# Patient Record
Sex: Female | Born: 1975 | Race: Black or African American | Hispanic: No | Marital: Married | Smoking: Never smoker
Health system: Southern US, Community
[De-identification: ages and names within clinical notes are randomized; demographics above are authoritative.]

## PROBLEM LIST (undated history)

## (undated) DIAGNOSIS — R42 Dizziness and giddiness: Secondary | ICD-10-CM

## (undated) DIAGNOSIS — D649 Anemia, unspecified: Secondary | ICD-10-CM

## (undated) DIAGNOSIS — K219 Gastro-esophageal reflux disease without esophagitis: Secondary | ICD-10-CM

## (undated) DIAGNOSIS — D259 Leiomyoma of uterus, unspecified: Secondary | ICD-10-CM

## (undated) DIAGNOSIS — Z862 Personal history of diseases of the blood and blood-forming organs and certain disorders involving the immune mechanism: Secondary | ICD-10-CM

## (undated) DIAGNOSIS — G43909 Migraine, unspecified, not intractable, without status migrainosus: Secondary | ICD-10-CM

## (undated) DIAGNOSIS — K317 Polyp of stomach and duodenum: Secondary | ICD-10-CM

## (undated) DIAGNOSIS — K589 Irritable bowel syndrome without diarrhea: Secondary | ICD-10-CM

## (undated) DIAGNOSIS — I2699 Other pulmonary embolism without acute cor pulmonale: Secondary | ICD-10-CM

## (undated) HISTORY — DX: Dizziness and giddiness: R42

## (undated) HISTORY — DX: Polyp of stomach and duodenum: K31.7

## (undated) HISTORY — DX: Leiomyoma of uterus, unspecified: D25.9

## (undated) HISTORY — DX: Other pulmonary embolism without acute cor pulmonale: I26.99

## (undated) HISTORY — PX: CHOLECYSTECTOMY: SHX55

## (undated) HISTORY — DX: Migraine, unspecified, not intractable, without status migrainosus: G43.909

## (undated) HISTORY — DX: Anemia, unspecified: D64.9

## (undated) HISTORY — PX: HERNIA REPAIR: SHX51

---

## 2007-12-10 ENCOUNTER — Emergency Department: Payer: Self-pay | Admitting: Emergency Medicine

## 2008-01-03 ENCOUNTER — Ambulatory Visit: Payer: Self-pay | Admitting: Internal Medicine

## 2008-01-30 ENCOUNTER — Ambulatory Visit: Payer: Self-pay | Admitting: Internal Medicine

## 2008-01-31 ENCOUNTER — Ambulatory Visit: Payer: Self-pay | Admitting: Internal Medicine

## 2008-04-17 ENCOUNTER — Ambulatory Visit: Payer: Self-pay | Admitting: Internal Medicine

## 2009-01-06 ENCOUNTER — Ambulatory Visit: Payer: Self-pay | Admitting: Internal Medicine

## 2009-06-03 ENCOUNTER — Ambulatory Visit: Payer: Self-pay | Admitting: Internal Medicine

## 2009-08-24 ENCOUNTER — Emergency Department: Payer: Self-pay | Admitting: Internal Medicine

## 2009-08-25 ENCOUNTER — Emergency Department: Payer: Self-pay | Admitting: Emergency Medicine

## 2010-08-22 HISTORY — PX: UTERINE FIBROID SURGERY: SHX826

## 2010-08-22 HISTORY — PX: APPENDECTOMY: SHX54

## 2014-03-14 DIAGNOSIS — D509 Iron deficiency anemia, unspecified: Secondary | ICD-10-CM | POA: Insufficient documentation

## 2014-03-14 DIAGNOSIS — Z86018 Personal history of other benign neoplasm: Secondary | ICD-10-CM | POA: Insufficient documentation

## 2014-03-14 DIAGNOSIS — R0681 Apnea, not elsewhere classified: Secondary | ICD-10-CM | POA: Insufficient documentation

## 2014-03-14 DIAGNOSIS — R109 Unspecified abdominal pain: Secondary | ICD-10-CM | POA: Insufficient documentation

## 2014-03-14 HISTORY — DX: Iron deficiency anemia, unspecified: D50.9

## 2014-03-14 HISTORY — DX: Personal history of other benign neoplasm: Z86.018

## 2014-08-26 DIAGNOSIS — N939 Abnormal uterine and vaginal bleeding, unspecified: Secondary | ICD-10-CM | POA: Insufficient documentation

## 2015-05-12 ENCOUNTER — Ambulatory Visit (INDEPENDENT_AMBULATORY_CARE_PROVIDER_SITE_OTHER): Payer: 59 | Admitting: Family Medicine

## 2015-05-12 ENCOUNTER — Encounter: Payer: Self-pay | Admitting: Family Medicine

## 2015-05-12 VITALS — BP 118/78 | HR 102 | Temp 99.1°F | Resp 18 | Ht 64.5 in | Wt 196.8 lb

## 2015-05-12 DIAGNOSIS — R509 Fever, unspecified: Secondary | ICD-10-CM | POA: Insufficient documentation

## 2015-05-12 DIAGNOSIS — R05 Cough: Secondary | ICD-10-CM

## 2015-05-12 DIAGNOSIS — G44209 Tension-type headache, unspecified, not intractable: Secondary | ICD-10-CM

## 2015-05-12 DIAGNOSIS — R110 Nausea: Secondary | ICD-10-CM

## 2015-05-12 DIAGNOSIS — R059 Cough, unspecified: Secondary | ICD-10-CM | POA: Insufficient documentation

## 2015-05-12 DIAGNOSIS — R112 Nausea with vomiting, unspecified: Secondary | ICD-10-CM | POA: Insufficient documentation

## 2015-05-12 MED ORDER — ONDANSETRON 8 MG PO TBDP: 8.0000 mg | ORAL_TABLET | Freq: Three times a day (TID) | ORAL | Status: DC | PRN

## 2015-05-12 MED ORDER — OMEPRAZOLE 40 MG PO CPDR: 40.0000 mg | DELAYED_RELEASE_CAPSULE | Freq: Every day | ORAL | Status: DC

## 2015-05-12 NOTE — Progress Notes (Signed)
Name: Sharon Herring   MRN: [id number]    DOB: Mar 28, 1976   Date:05/12/2015       Progress Note  Subjective  Chief Complaint  Chief Complaint  Patient presents with  . Establish Care  . Emesis    HPI  Sharon Herring is a 39 y.o. female here today to transition care of medical needs to a primary care provider. She is having acute symptoms of nausea and vomiting starting this morning. Not associated with abdominal pain, diarrhea or constipation, rash, recent travel, new foods. She reports having a mild frontal headache without dizziness, ear fullness. Does report dry tongue, loss of taste for food, anorexia, fatigue and low grade temps.  History significant for uterine fibroids with heavy bleeding but she has had fibroidectomy some years ago and denies recent heavy menstrual bleeding.  She is not concerned for pregnancy at this time. Patient presents with headache. Symptoms began about a few hours ago. Generally, the headaches last about a few hours and occur rare. The headache do not seem to be related to any time of the day. The headaches are usually dull and are frontal in location.  The patient rates her most severe headaches a 3 on a scale from 1 to 10. Recently, the headaches are stable. School attendance or other daily activities are affected by the headaches. Precipitating factors include URI symptoms. The headaches are usually not preceded by an aura. Associated neurologic symptoms which are present include: vomiting in the early morning. The patient denies depression, dizziness, loss of balance, muscle weakness, numbness of extremities, speech difficulties and vision problems. Other associated symptoms include: appetite decrease, cough, fatigue, fever, nausea, sore throat and vomiting. Home treatment has included resting with fair improvement. Other history includes: nothing pertinent.   Active Ambulatory Problems    Diagnosis Date Noted  . Abnormal uterine bleeding 08/26/2014  . Absolute  anemia 03/14/2014  . Breathlessness on exertion 03/14/2014  . Fibroid 03/14/2014  . Nausea and vomiting in adult patient 05/12/2015  . Low grade fever 05/12/2015  . Cough 05/12/2015  . Cephalalgia 05/12/2015   Resolved Ambulatory Problems    Diagnosis Date Noted  . Abdominal pain 03/14/2014   Past Medical History  Diagnosis Date  . Fibroid uterus   . Anemia     Social History  Substance Use Topics  . Smoking status: Never Smoker   . Smokeless tobacco: Not on file  . Alcohol Use: No    No current outpatient prescriptions on file.  Past Surgical History  Procedure Laterality Date  . Uterine fibroid surgery  2012    Family History  Problem Relation Age of Onset  . Hypertension Mother   . Hypertension Father     Allergies  Allergen Reactions  . Aspirin Nausea And Vomiting  . Oxycodone-Acetaminophen Rash and Shortness Of Breath  . Tape Other (See Comments)    Burning feeling. Has left mark on skin.  . Norethindrone Rash     Review of Systems  CONSTITUTIONAL: No significant weight changes, fever, chills, weakness. Yes fatigue.  HEENT:  - Eyes: No visual changes.  - Ears: No auditory changes. No pain.  - Nose: No sneezing, congestion, runny nose. - Throat: Mild sore throat. No changes in swallowing. SKIN: No rash or itching.  CARDIOVASCULAR: No chest pain, chest pressure or chest discomfort. No palpitations or edema.  RESPIRATORY: No shortness of breath. Mild cough. GASTROINTESTINAL: Yes anorexia, nausea, vomiting. No changes in bowel habits. No abdominal pain or blood.  GENITOURINARY: No  dysuria. No frequency. No discharge.  NEUROLOGICAL: Mild headache. No dizziness, syncope, paralysis, ataxia, numbness or tingling in the extremities. No memory changes. No change in bowel or bladder control.  MUSCULOSKELETAL: No joint pain. No muscle pain. HEMATOLOGIC: No anemia, bleeding or bruising.  LYMPHATICS: No enlarged lymph nodes.  PSYCHIATRIC: No change in mood. No  change in sleep pattern.  ENDOCRINOLOGIC: No reports of sweating, cold or heat intolerance. No polyuria or polydipsia.     Objective  BP 118/78 mmHg  Pulse 102  Temp(Src) 99.1 F (37.3 C) (Oral)  Resp 18  Ht 5' 4.5" (1.638 m)  Wt 196 lb 12.8 oz (89.268 kg)  BMI 33.27 kg/m2  SpO2 99%  LMP 04/25/2015 (Exact Date) Body mass index is 33.27 kg/(m^2).  Physical Exam  Constitutional: Patient appears well-developed and well-nourished, warm to the touch. In no distress.  HEENT:  - Head: Normocephalic and atraumatic.  - Ears: Bilateral TMs gray, no erythema or effusion - Nose: Nasal mucosa moist, congested. - Mouth/Throat: Oropharynx is clear and dry. No tonsillar hypertrophy or erythema. No post nasal drainage.  - Eyes: Conjunctivae clear, EOM movements normal. PERRLA. No scleral icterus.  Neck: Normal range of motion. Neck supple. No JVD present. No thyromegaly present.  Cardiovascular: Normal rate, regular rhythm and normal heart sounds.  No murmur heard.  Pulmonary/Chest: Effort normal and breath sounds normal. No respiratory distress. Abdomen: Soft, bowel sounds slightly increased generally, no HSM, no guarding. Mild tenderness at epigastric area.  Musculoskeletal: Normal range of motion bilateral UE and LE, no joint effusions. Peripheral vascular: Bilateral LE no edema. Neurological: CN II-XII grossly intact with no focal deficits. Alert and oriented to person, place, and time. Coordination, balance, strength, speech and gait are normal.  Skin: Skin is warm and dry. No rash noted. No erythema.  Psychiatric: Patient has a normal mood and affect. Behavior is normal in office today. Judgment and thought content normal in office today.   Assessment & Plan  1. Nausea and vomiting in adult patient Etiology unclear, may be gastritis or viral infection early presentation. Plan to treat with anti emetic medication, increase hydration at home, and start PPI. If symptoms worsen plan is to  get blood work and CXR.  - ondansetron (ZOFRAN-ODT) 8 MG disintegrating tablet; Take 1 tablet (8 mg total) by mouth every 8 (eight) hours as needed for nausea or vomiting.  Dispense: 30 tablet; Refill: 1 - omeprazole (PRILOSEC) 40 MG capsule; Take 1 capsule (40 mg total) by mouth daily.  Dispense: 14 capsule; Refill: 0  2. Low grade fever Rapid flu swab negative.  - POCT Influenza A/B; Standing - POCT Influenza A/B  3. Cough Treat symptomatically and if progresses will get CXR.   4. Tension-type headache, not intractable, unspecified chronicity pattern Likely related to current acute prodrome combined with mild dehydration. Tameka has been instructed to increase water intake and use NSAID or Tylenol as tolerated.

## 2015-06-02 ENCOUNTER — Ambulatory Visit (INDEPENDENT_AMBULATORY_CARE_PROVIDER_SITE_OTHER): Payer: 59 | Admitting: Family Medicine

## 2015-06-02 ENCOUNTER — Encounter: Payer: Self-pay | Admitting: Family Medicine

## 2015-06-02 VITALS — BP 124/78 | HR 95 | Temp 98.1°F | Resp 16 | Ht 65.0 in | Wt 202.2 lb

## 2015-06-02 DIAGNOSIS — E669 Obesity, unspecified: Secondary | ICD-10-CM

## 2015-06-02 DIAGNOSIS — Z136 Encounter for screening for cardiovascular disorders: Secondary | ICD-10-CM

## 2015-06-02 DIAGNOSIS — Z13220 Encounter for screening for lipoid disorders: Secondary | ICD-10-CM

## 2015-06-02 DIAGNOSIS — IMO0001 Reserved for inherently not codable concepts without codable children: Secondary | ICD-10-CM

## 2015-06-02 DIAGNOSIS — D509 Iron deficiency anemia, unspecified: Secondary | ICD-10-CM

## 2015-06-02 DIAGNOSIS — E66811 Obesity, class 1: Secondary | ICD-10-CM

## 2015-06-02 MED ORDER — BELVIQ 10 MG PO TABS: 1.0000 | ORAL_TABLET | Freq: Two times a day (BID) | ORAL | Status: DC

## 2015-06-02 NOTE — Progress Notes (Signed)
Name: Sharon Herring   MRN: [id number]    DOB: Feb 11, 1976   Date:06/02/2015       Progress Note  Subjective  Chief Complaint  Chief Complaint  Patient presents with  . Obesity    pt here to discuss weight loss    HPI  Sharon Herring is a 39 year old female who is here today to discuss her weight gain. Today's weight is the most she has weight. Previously she was exercising regularly and has maintained a weight around 135-140 lbs which she is more comfortable with. She has not had time to exercise routinely like she used to but is planning on returning to a better routine. Overall her diet is well balanced consisting of vegetables, fruits, lean meats. She does like carbohydrates but not in the form of frequent sugars, sodas, coffees. She drinks tea and keep well hydrated. Started drinking Flat Tummy Tea. History of anemia with good control with oral iron supplement use although not daily.   Past Medical History  Diagnosis Date  . Fibroid uterus   . Anemia     Patient Active Problem List   Diagnosis Date Noted  . Nausea and vomiting in adult patient 05/12/2015  . Low grade fever 05/12/2015  . Cough 05/12/2015  . Cephalalgia 05/12/2015  . Abnormal uterine bleeding 08/26/2014  . Absolute anemia 03/14/2014  . Breathlessness on exertion 03/14/2014  . Fibroid 03/14/2014    Social History  Substance Use Topics  . Smoking status: Never Smoker   . Smokeless tobacco: Not on file  . Alcohol Use: No     Current outpatient prescriptions:  .  omeprazole (PRILOSEC) 40 MG capsule, Take 1 capsule (40 mg total) by mouth daily. (Patient not taking: Reported on 06/02/2015), Disp: 14 capsule, Rfl: 0 .  ondansetron (ZOFRAN-ODT) 8 MG disintegrating tablet, Take 1 tablet (8 mg total) by mouth every 8 (eight) hours as needed for nausea or vomiting. (Patient not taking: Reported on 06/02/2015), Disp: 30 tablet, Rfl: 1  Allergies  Allergen Reactions  . Aspirin Nausea And Vomiting  .  Oxycodone-Acetaminophen Rash and Shortness Of Breath  . Tape Other (See Comments)    Burning feeling. Has left mark on skin.  . Norethindrone Rash    Review of Systems  CONSTITUTIONAL: Yes significant weight changes. No fever, chills, weakness or fatigue.  CARDIOVASCULAR: No chest pain, chest pressure or chest discomfort. No palpitations or edema.  RESPIRATORY: No shortness of breath, cough or sputum.  GASTROINTESTINAL: No anorexia, nausea, vomiting. No changes in bowel habits. No abdominal pain or blood.  HEMATOLOGIC: No bleeding or bruising.  LYMPHATICS: No enlarged lymph nodes.  PSYCHIATRIC: No change in mood. No change in sleep pattern.  ENDOCRINOLOGIC: No reports of sweating, cold or heat intolerance. No polyuria or polydipsia.     Objective  BP 124/78 mmHg  Pulse 95  Temp(Src) 98.1 F (36.7 C)  Resp 16  Ht 5\' 5"  (1.651 m)  Wt 202 lb 4 oz (91.74 kg)  BMI 33.66 kg/m2  SpO2 99%  LMP 05/25/2015  Body mass index is 33.66 kg/(m^2).   Physical Exam  Constitutional: Patient is overweight and well-nourished. In no distress.  HEENT:  - Head: Normocephalic and atraumatic.  - Ears: Bilateral TMs gray, no erythema or effusion - Nose: Nasal mucosa moist - Mouth/Throat: Oropharynx is clear and moist. No tonsillar hypertrophy or erythema. No post nasal drainage.  - Eyes: Conjunctivae clear, EOM movements normal. PERRLA. No scleral icterus.  Neck: Normal range of motion. Neck  supple. No JVD present. No thyromegaly present.  Cardiovascular: Normal rate, regular rhythm and normal heart sounds.  No murmur heard.  Pulmonary/Chest: Effort normal and breath sounds normal. No respiratory distress. Musculoskeletal: Normal range of motion bilateral UE and LE, no joint effusions. Abdomen: Soft, non tender, non distended, normal bowel sounds. Skin: Skin is warm and dry. No rash noted. No erythema.  Psychiatric: Patient has a normal mood and affect. Behavior is normal in office today.  Judgment and thought content normal in office today.    Assessment & Plan   1. Obesity, Class I, BMI 30.0-34.9 (see actual BMI) The patient has been counseled on their higher than normal BMI.  They have verbally expressed understanding their increased risk for other diseases.  In efforts to meet a better target BMI goal the patient has been counseled on lifestyle, diet and exercise modification tactics. Start with moderate intensity aerobic exercise (walking, jogging, elliptical, swimming, group or individual sports, hiking) at least 36mins a day at least 4 days a week and increase intensity, duration, frequency as tolerated. Diet should include well balance fresh fruits and vegetables avoiding processed foods, carbohydrates and sugars. Drink at least 8oz 10 glasses a day avoiding sodas, sugary fruit drinks, sweetened tea. Check weight on a reliable scale daily and monitor weight loss progress daily. Consider investing in mobile phone apps that will help keep track of weight loss goals.  Belviq has known to cause blood cell line suppression rarely but will get baseline blood work and check her cbc periodically.  - CBC with Differential/Platelet - Comprehensive metabolic panel - TSH - BELVIQ 10 MG TABS; Take 1 tablet by mouth 2 (two) times daily.  Dispense: 60 tablet; Refill: 3  2. Encounter for cholesteral screening for cardiovascular disease  - Lipid panel  3. Iron deficiency anemia Blood work to recheck anemia status.  - CBC with Differential/Platelet - Comprehensive metabolic panel - TSH

## 2015-06-08 ENCOUNTER — Other Ambulatory Visit
Admission: RE | Admit: 2015-06-08 | Discharge: 2015-06-08 | Disposition: A | Discharge status: Home / Self Care | Payer: 59 | Source: Ambulatory Visit | Attending: Family Medicine | Admitting: Family Medicine

## 2015-06-08 DIAGNOSIS — Z136 Encounter for screening for cardiovascular disorders: Secondary | ICD-10-CM | POA: Insufficient documentation

## 2015-06-08 DIAGNOSIS — D509 Iron deficiency anemia, unspecified: Secondary | ICD-10-CM | POA: Diagnosis present

## 2015-06-08 DIAGNOSIS — Z13220 Encounter for screening for lipoid disorders: Secondary | ICD-10-CM | POA: Diagnosis present

## 2015-06-08 DIAGNOSIS — E669 Obesity, unspecified: Secondary | ICD-10-CM | POA: Diagnosis present

## 2015-06-08 LAB — COMPREHENSIVE METABOLIC PANEL
ALBUMIN: 3.6 g/dL (ref 3.5–5.0)
ALK PHOS: 56 U/L (ref 38–126)
ALT: 12 U/L — AB (ref 14–54)
AST: 15 U/L (ref 15–41)
Anion gap: 7 (ref 5–15)
BUN: 10 mg/dL (ref 6–20)
CALCIUM: 9.3 mg/dL (ref 8.9–10.3)
CO2: 27 mmol/L (ref 22–32)
CREATININE: 0.71 mg/dL (ref 0.44–1.00)
Chloride: 103 mmol/L (ref 101–111)
GFR calc Af Amer: >60 mL/min (ref >60)
GFR calc non Af Amer: >60 mL/min (ref >60)
GLUCOSE: 112 mg/dL — AB (ref 65–99)
Potassium: 3.9 mmol/L (ref 3.5–5.1)
SODIUM: 137 mmol/L (ref 135–145)
Total Bilirubin: 0.2 mg/dL — ABNORMAL LOW (ref 0.3–1.2)
Total Protein: 7.3 g/dL (ref 6.5–8.1)

## 2015-06-08 LAB — CBC WITH DIFFERENTIAL/PLATELET
BASOS PCT: 0 %
Basophils Absolute: 0 10*3/uL (ref 0–0.1)
EOS PCT: 1 %
Eosinophils Absolute: 0 10*3/uL (ref 0–0.7)
HEMATOCRIT: 32.4 % — AB (ref 35.0–47.0)
HEMOGLOBIN: 10.4 g/dL — AB (ref 12.0–16.0)
LYMPHS PCT: 39 %
Lymphs Abs: 1.9 10*3/uL (ref 1.0–3.6)
MCH: 25.9 pg — ABNORMAL LOW (ref 26.0–34.0)
MCHC: 32 g/dL (ref 32.0–36.0)
MCV: 81 fL (ref 80.0–100.0)
MONO ABS: 0.4 10*3/uL (ref 0.2–0.9)
MONOS PCT: 9 %
Neutro Abs: 2.5 10*3/uL (ref 1.4–6.5)
Neutrophils Relative %: 51 %
Platelets: 410 10*3/uL (ref 150–440)
RBC: 4 MIL/uL (ref 3.80–5.20)
RDW: 14.5 % (ref 11.5–14.5)
WBC: 4.9 10*3/uL (ref 3.6–11.0)

## 2015-06-08 LAB — LIPID PANEL
Cholesterol: 163 mg/dL (ref 0–200)
HDL: 36 mg/dL — AB (ref >40)
LDL CALC: 108 mg/dL — AB (ref 0–99)
Total CHOL/HDL Ratio: 4.5 RATIO
Triglycerides: 94 mg/dL (ref <150)
VLDL: 19 mg/dL (ref 0–40)

## 2015-06-08 LAB — TSH: TSH: 1.74 u[IU]/mL (ref 0.350–4.500)

## 2015-06-09 ENCOUNTER — Telehealth: Payer: Self-pay | Admitting: Family Medicine

## 2015-06-09 NOTE — Telephone Encounter (Signed)
Patient need prior authorization for mediation prescribed

## 2015-06-10 NOTE — Telephone Encounter (Signed)
Ethelle, please get with Shelly around the PA for the [facility] Rx I prescribed for you.

## 2015-06-10 NOTE — Telephone Encounter (Signed)
PA has been submitted.

## 2015-06-26 ENCOUNTER — Other Ambulatory Visit: Payer: Self-pay | Admitting: Family Medicine

## 2015-06-26 DIAGNOSIS — E669 Obesity, unspecified: Secondary | ICD-10-CM

## 2015-06-26 MED ORDER — NALTREXONE-BUPROPION HCL ER 8-90 MG PO TB12: 1.0000 | ORAL_TABLET | Freq: Two times a day (BID) | ORAL | Status: DC

## 2015-07-02 ENCOUNTER — Other Ambulatory Visit: Payer: Self-pay

## 2015-07-02 DIAGNOSIS — E669 Obesity, unspecified: Secondary | ICD-10-CM

## 2015-07-02 MED ORDER — NALTREXONE-BUPROPION HCL ER 8-90 MG PO TB12: 2.0000 | ORAL_TABLET | Freq: Two times a day (BID) | ORAL | Status: DC

## 2015-07-06 ENCOUNTER — Other Ambulatory Visit: Payer: Self-pay | Admitting: Family Medicine

## 2015-07-06 DIAGNOSIS — G43001 Migraine without aura, not intractable, with status migrainosus: Secondary | ICD-10-CM

## 2015-07-06 MED ORDER — SUMATRIPTAN SUCCINATE 50 MG PO TABS: ORAL_TABLET | ORAL | Status: DC

## 2015-07-21 ENCOUNTER — Other Ambulatory Visit: Payer: Self-pay | Admitting: Family Medicine

## 2015-07-21 MED ORDER — LORCASERIN HCL 10 MG PO TABS: 1.0000 | ORAL_TABLET | Freq: Two times a day (BID) | ORAL | Status: DC

## 2015-08-11 ENCOUNTER — Other Ambulatory Visit: Payer: Self-pay | Admitting: Family Medicine

## 2015-08-11 DIAGNOSIS — D509 Iron deficiency anemia, unspecified: Secondary | ICD-10-CM

## 2015-08-11 DIAGNOSIS — K5900 Constipation, unspecified: Secondary | ICD-10-CM

## 2015-08-11 MED ORDER — DOCUSATE SODIUM 100 MG PO CAPS: 100.0000 mg | ORAL_CAPSULE | Freq: Every day | ORAL | Status: DC

## 2015-08-11 MED ORDER — FERROUS SULFATE 325 (65 FE) MG PO TABS: 325.0000 mg | ORAL_TABLET | Freq: Every day | ORAL | Status: DC

## 2015-09-23 ENCOUNTER — Other Ambulatory Visit: Payer: Self-pay | Admitting: Family Medicine

## 2015-09-23 DIAGNOSIS — R519 Headache, unspecified: Secondary | ICD-10-CM

## 2015-09-23 DIAGNOSIS — R51 Headache: Secondary | ICD-10-CM

## 2015-09-23 DIAGNOSIS — K5901 Slow transit constipation: Secondary | ICD-10-CM

## 2015-09-23 DIAGNOSIS — K5909 Other constipation: Secondary | ICD-10-CM | POA: Insufficient documentation

## 2015-09-23 MED ORDER — IBUPROFEN 800 MG PO TABS: 800.0000 mg | ORAL_TABLET | Freq: Three times a day (TID) | ORAL | Status: DC | PRN

## 2015-11-02 ENCOUNTER — Telehealth (INDEPENDENT_AMBULATORY_CARE_PROVIDER_SITE_OTHER): Payer: 59

## 2015-11-02 DIAGNOSIS — R300 Dysuria: Secondary | ICD-10-CM | POA: Diagnosis not present

## 2015-11-02 LAB — POCT URINALYSIS DIPSTICK
Bilirubin, UA: NEGATIVE
GLUCOSE UA: NEGATIVE
Ketones, UA: NEGATIVE
LEUKOCYTES UA: NEGATIVE
NITRITE UA: NEGATIVE
Protein, UA: NEGATIVE
RBC UA: NEGATIVE
Spec Grav, UA: 1.015
UROBILINOGEN UA: 0.2
pH, UA: 5.5

## 2015-11-02 NOTE — Telephone Encounter (Signed)
Will order Udip and Ucx and treat if needed. Send me results of Udip please.

## 2015-11-02 NOTE — Telephone Encounter (Signed)
Patient having symptoms of UTI.  Having low back pain and pressure with dark color urine.

## 2015-11-03 ENCOUNTER — Other Ambulatory Visit: Payer: Self-pay | Admitting: Family Medicine

## 2015-11-03 DIAGNOSIS — R300 Dysuria: Secondary | ICD-10-CM | POA: Diagnosis not present

## 2015-11-06 ENCOUNTER — Other Ambulatory Visit: Payer: Self-pay | Admitting: Family Medicine

## 2015-11-06 DIAGNOSIS — E669 Obesity, unspecified: Secondary | ICD-10-CM

## 2015-11-06 LAB — URINE CULTURE

## 2015-11-06 MED ORDER — PHENTERMINE HCL 37.5 MG PO CAPS: 37.5000 mg | ORAL_CAPSULE | Freq: Every day | ORAL | Status: DC

## 2015-11-10 ENCOUNTER — Telehealth: Payer: Self-pay | Admitting: Family Medicine

## 2015-11-10 ENCOUNTER — Other Ambulatory Visit: Payer: Self-pay

## 2015-11-10 MED ORDER — OSELTAMIVIR PHOSPHATE 75 MG PO CAPS: 75.0000 mg | ORAL_CAPSULE | Freq: Every day | ORAL | Status: DC

## 2015-11-10 MED ORDER — NITROFURANTOIN MONOHYD MACRO 100 MG PO CAPS: 100.0000 mg | ORAL_CAPSULE | Freq: Two times a day (BID) | ORAL | Status: DC

## 2015-11-10 NOTE — Telephone Encounter (Signed)
Patient was exposed and is requesting prophylaxis

## 2015-11-10 NOTE — Telephone Encounter (Signed)
I reviewed urine culture; not overwhelming cystitis, but there is evidence of some infection Sharon Herring, please contact pt If patient still having symptoms, let her know I'm sending antibiotic If symptoms don't resolve after antibiotics (or get worse), please contact us right away Encourage hydration Thank you

## 2015-11-10 NOTE — Telephone Encounter (Signed)
Patient notified

## 2015-11-13 ENCOUNTER — Encounter: Payer: Self-pay | Admitting: Family Medicine

## 2015-11-13 ENCOUNTER — Ambulatory Visit (INDEPENDENT_AMBULATORY_CARE_PROVIDER_SITE_OTHER): Payer: 59 | Admitting: Family Medicine

## 2015-11-13 VITALS — BP 110/68 | HR 94 | Temp 97.9°F | Resp 16 | Wt 210.6 lb

## 2015-11-13 DIAGNOSIS — M5442 Lumbago with sciatica, left side: Secondary | ICD-10-CM | POA: Diagnosis not present

## 2015-11-13 DIAGNOSIS — N920 Excessive and frequent menstruation with regular cycle: Secondary | ICD-10-CM | POA: Insufficient documentation

## 2015-11-13 MED ORDER — PREDNISONE 10 MG (48) PO TBPK: ORAL_TABLET | Freq: Every day | ORAL | Status: DC

## 2015-11-13 NOTE — Progress Notes (Signed)
Name: Sharon Herring   MRN: [id number]    DOB: 12-20-1975   Date:11/13/2015       Progress Note  Subjective  Chief Complaint  Chief Complaint  Patient presents with  . Fatigue    patient has been sick for a couple of weaks now.  . Weakness    patient has tried to get some rest.   . Spasms    patient stated she was having some pain in her lower back that radiated from the right side to the front groin area.  . Emesis    tuesday and wednesday  . Diarrhea    nothing since yesterday.  . Urinary Tract Infection    patient is currently on Macrobid 136md BID    HPI  Low back pain: pain on left lower back started a couple of weeks ago, she was treated with Macrobid for UTI, but culture was negative and symptoms are still present. No symptoms of dysuria, hematuria or frequency. Pain is described is dull but sometimes a catch or a stabbing sensation, occasionally pain radiates to left lower abdomen, and when radiates to the front it is a shooting pain. She has been taking Ibuprofen occasionally with some improvement of symptoms. She had a recent change in bowel movements from a gastroenteritis, but symptoms have resolved. She is still feeling tired and taking Tamiflu for prevention since mother recently had the flu. LMP 11/03/2015   Patient Active Problem List   Diagnosis Date Noted  . Menorrhagia 11/13/2015  . Constipation 09/23/2015  . Headache 09/23/2015  . Obesity, Class I, BMI 30.0-34.9 (see actual BMI) 06/02/2015  . Encounter for cholesteral screening for cardiovascular disease 06/02/2015  . Iron deficiency anemia 03/14/2014  . History of uterine fibroid 03/14/2014    Past Surgical History  Procedure Laterality Date  . Uterine fibroid surgery  2012    Family History  Problem Relation Age of Onset  . Hypertension Mother   . Hypertension Father     Social History   Social History  . Marital Status: Single    Spouse Name: N/A  . Number of Children: N/A  . Years of  Education: N/A   Occupational History  . Not on file.   Social History Main Topics  . Smoking status: Never Smoker   . Smokeless tobacco: Not on file  . Alcohol Use: No  . Drug Use: No  . Sexual Activity: Not Currently   Other Topics Concern  . Not on file   Social History Narrative     Current outpatient prescriptions:  .  docusate sodium (COLACE) 100 MG capsule, Take 1 capsule (100 mg total) by mouth daily., Disp: 90 capsule, Rfl: 3 .  ferrous sulfate 325 (65 FE) MG tablet, Take 1 tablet (325 mg total) by mouth daily with breakfast., Disp: 90 tablet, Rfl: 3 .  ibuprofen (ADVIL,MOTRIN) 800 MG tablet, Take 1 tablet (800 mg total) by mouth every 8 (eight) hours as needed., Disp: 30 tablet, Rfl: 1 .  oseltamivir (TAMIFLU) 75 MG capsule, Take 1 capsule (75 mg total) by mouth daily., Disp: 10 capsule, Rfl: 0 .  predniSONE (STERAPRED UNI-PAK 48 TAB) 10 MG (48) TBPK tablet, Take by mouth daily., Disp: 48 tablet, Rfl: 0  Allergies  Allergen Reactions  . Aspirin Nausea And Vomiting  . Oxycodone-Acetaminophen Rash and Shortness Of Breath  . Tape Other (See Comments)    Burning feeling. Has left mark on skin.  . Norethindrone Rash     ROS  Constitutional:  Negative for fever, positive for  weight change.  Respiratory: Negative for cough and shortness of breath.   Cardiovascular: Negative for chest pain or palpitations.  Gastrointestinal: Positive  for intermittent abdominal pain, no bowel changes.  Musculoskeletal: Negative for gait problem or joint swelling.  Skin: Negative for rash.  Neurological: Negative for dizziness or headache.  No other specific complaints in a complete review of systems (except as listed in HPI above).  Objective  Filed Vitals:   11/13/15 0906  BP: 110/68  Pulse: 94  Temp: 97.9 F (36.6 C)  TempSrc: Oral  Resp: 16  Weight: 210 lb 9.6 oz (95.528 kg)  SpO2: 98%    Body mass index is 35.05 kg/(m^2).  Physical Exam  Constitutional: Patient  appears well-developed and well-nourished. Obese  No distress.  HEENT: head atraumatic, normocephalic, pupils equal and reactive to light,  neck supple, throat within normal limits Cardiovascular: Normal rate, regular rhythm and normal heart sounds.  No murmur heard. No BLE edema. Pulmonary/Chest: Effort normal and breath sounds normal. No respiratory distress. Abdominal: Soft.  There is no tenderness. Psychiatric: Patient has a normal mood and affect. behavior is normal. Judgment and thought content normal. Muscular Skeletal: pain during palpation of right and left lower back, but worse on left side, negative straight leg raise, mild discomfort with palpation of left lower quadrant but no masses.   Recent Results (from the past 2160 hour(s))  POCT Urinalysis Dipstick     Status: Normal   Collection Time: 11/02/15  4:45 PM  Result Value Ref Range   Color, UA yellow    Clarity, UA clera    Glucose, UA neg    Bilirubin, UA neg    Ketones, UA neg    Spec Grav, UA 1.015    Blood, UA neg    pH, UA 5.5    Protein, UA neg    Urobilinogen, UA 0.2    Nitrite, UA neg    Leukocytes, UA Negative Negative  Urine culture     Status: None   Collection Time: 11/03/15  4:27 PM  Result Value Ref Range   Urine Culture, Routine Final report    Urine Culture result 1 Comment     Comment: Mixed urogenital flora 10,000-25,000 colony forming units per mL      PHQ2/9: Depression screen [facility] 2/9 11/13/2015 06/02/2015 05/12/2015  Decreased Interest 0 0 0  Down, Depressed, Hopeless 0 0 0  PHQ - 2 Score 0 0 0    Fall Risk: Fall Risk  11/13/2015 06/02/2015 05/12/2015  Falls in the past year? No No No    Functional Status Survey: Is the patient deaf or have difficulty hearing?: No Does the patient have difficulty seeing, even when wearing glasses/contacts?: No Does the patient have difficulty concentrating, remembering, or making decisions?: No Does the patient have difficulty walking or climbing  stairs?: No Does the patient have difficulty dressing or bathing?: No Does the patient have difficulty doing errands alone such as visiting a doctor's office or shopping?: No    Assessment & Plan  1. Left-sided low back pain with left-sided sciatica  Explained possible radiculitis, causing abdominal pain, we will try a prednisone taper but if no improvement we will discuss PT referral versus pelvic US since she has history of pelvic surgery and fibroid tumor. - predniSONE (STERAPRED UNI-PAK 48 TAB) 10 MG (48) TBPK tablet; Take by mouth daily.  Dispense: 48 tablet; Refill: 0

## 2015-11-25 ENCOUNTER — Telehealth: Payer: Self-pay

## 2015-11-25 DIAGNOSIS — Z131 Encounter for screening for diabetes mellitus: Secondary | ICD-10-CM

## 2015-11-25 DIAGNOSIS — R5383 Other fatigue: Secondary | ICD-10-CM

## 2015-11-25 DIAGNOSIS — D509 Iron deficiency anemia, unspecified: Secondary | ICD-10-CM

## 2015-11-25 NOTE — Telephone Encounter (Signed)
Patient reports of being very tired and having no energy.  Patient has a history of anemia, last labs showed low hgb.  She would like to know if you could order some labs to see if her levels has dropped again.

## 2015-11-25 NOTE — Telephone Encounter (Signed)
Labs were printed and given to the patient.

## 2015-12-02 ENCOUNTER — Other Ambulatory Visit
Admission: RE | Admit: 2015-12-02 | Discharge: 2015-12-02 | Disposition: A | Discharge status: Home / Self Care | Payer: 59 | Source: Ambulatory Visit | Attending: Family Medicine | Admitting: Family Medicine

## 2015-12-02 DIAGNOSIS — D509 Iron deficiency anemia, unspecified: Secondary | ICD-10-CM | POA: Diagnosis not present

## 2015-12-02 DIAGNOSIS — Z131 Encounter for screening for diabetes mellitus: Secondary | ICD-10-CM | POA: Diagnosis not present

## 2015-12-02 DIAGNOSIS — R5383 Other fatigue: Secondary | ICD-10-CM | POA: Insufficient documentation

## 2015-12-02 LAB — COMPREHENSIVE METABOLIC PANEL
ALT: 11 U/L — ABNORMAL LOW (ref 14–54)
ANION GAP: 3 — AB (ref 5–15)
AST: 14 U/L — AB (ref 15–41)
Albumin: 3.5 g/dL (ref 3.5–5.0)
Alkaline Phosphatase: 56 U/L (ref 38–126)
BILIRUBIN TOTAL: 0.5 mg/dL (ref 0.3–1.2)
BUN: 12 mg/dL (ref 6–20)
CHLORIDE: 106 mmol/L (ref 101–111)
CO2: 27 mmol/L (ref 22–32)
Calcium: 9.2 mg/dL (ref 8.9–10.3)
Creatinine, Ser: 0.64 mg/dL (ref 0.44–1.00)
GFR calc Af Amer: >60 mL/min (ref >60)
Glucose, Bld: 96 mg/dL (ref 65–99)
POTASSIUM: 4.1 mmol/L (ref 3.5–5.1)
Sodium: 136 mmol/L (ref 135–145)
TOTAL PROTEIN: 7.5 g/dL (ref 6.5–8.1)

## 2015-12-02 LAB — CBC WITH DIFFERENTIAL/PLATELET
BASOS ABS: 0 10*3/uL (ref 0–0.1)
Basophils Relative: 0 %
EOS ABS: 0 10*3/uL (ref 0–0.7)
Eosinophils Relative: 1 %
HEMATOCRIT: 32.7 % — AB (ref 35.0–47.0)
HEMOGLOBIN: 10.6 g/dL — AB (ref 12.0–16.0)
Lymphocytes Relative: 26 %
Lymphs Abs: 1.4 10*3/uL (ref 1.0–3.6)
MCH: 24.9 pg — ABNORMAL LOW (ref 26.0–34.0)
MCHC: 32.3 g/dL (ref 32.0–36.0)
MCV: 77.1 fL — ABNORMAL LOW (ref 80.0–100.0)
MONO ABS: 0.5 10*3/uL (ref 0.2–0.9)
MONOS PCT: 8 %
NEUTROS ABS: 3.5 10*3/uL (ref 1.4–6.5)
NEUTROS PCT: 65 %
Platelets: 445 10*3/uL — ABNORMAL HIGH (ref 150–440)
RBC: 4.24 MIL/uL (ref 3.80–5.20)
RDW: 15.7 % — AB (ref 11.5–14.5)
WBC: 5.4 10*3/uL (ref 3.6–11.0)

## 2015-12-02 LAB — TSH: TSH: 2.148 u[IU]/mL (ref 0.350–4.500)

## 2015-12-02 LAB — FERRITIN: FERRITIN: 18 ng/mL (ref 11–307)

## 2015-12-02 LAB — HEMOGLOBIN A1C: HEMOGLOBIN A1C: 6.1 % — AB (ref 4.0–6.0)

## 2015-12-02 LAB — VITAMIN B12: VITAMIN B 12: 203 pg/mL (ref 180–914)

## 2015-12-03 LAB — VITAMIN D 25 HYDROXY (VIT D DEFICIENCY, FRACTURES): Vit D, 25-Hydroxy: 12.3 ng/mL — ABNORMAL LOW (ref 30.0–100.0)

## 2015-12-04 ENCOUNTER — Other Ambulatory Visit: Payer: Self-pay | Admitting: Family Medicine

## 2015-12-04 MED ORDER — VITAMIN D (ERGOCALCIFEROL) 1.25 MG (50000 UNIT) PO CAPS: 50000.0000 [IU] | ORAL_CAPSULE | ORAL | Status: DC

## 2015-12-07 ENCOUNTER — Ambulatory Visit (INDEPENDENT_AMBULATORY_CARE_PROVIDER_SITE_OTHER): Payer: 59

## 2015-12-07 DIAGNOSIS — E538 Deficiency of other specified B group vitamins: Secondary | ICD-10-CM

## 2015-12-07 MED ORDER — CYANOCOBALAMIN 1000 MCG/ML IJ SOLN
1000.0000 ug | Freq: Once | INTRAMUSCULAR | Status: AC
  Administered 2015-12-07: 1000 ug via INTRAMUSCULAR

## 2015-12-15 ENCOUNTER — Ambulatory Visit: Payer: 59 | Admitting: Family Medicine

## 2015-12-16 ENCOUNTER — Ambulatory Visit (INDEPENDENT_AMBULATORY_CARE_PROVIDER_SITE_OTHER): Payer: 59 | Admitting: Family Medicine

## 2015-12-16 ENCOUNTER — Encounter: Payer: Self-pay | Admitting: Family Medicine

## 2015-12-16 VITALS — BP 116/66 | HR 85 | Temp 98.0°F | Resp 16 | Ht 65.0 in | Wt 211.5 lb

## 2015-12-16 DIAGNOSIS — K5900 Constipation, unspecified: Secondary | ICD-10-CM

## 2015-12-16 DIAGNOSIS — R112 Nausea with vomiting, unspecified: Secondary | ICD-10-CM | POA: Diagnosis not present

## 2015-12-16 DIAGNOSIS — R1013 Epigastric pain: Secondary | ICD-10-CM

## 2015-12-16 DIAGNOSIS — K5909 Other constipation: Secondary | ICD-10-CM

## 2015-12-16 MED ORDER — LINACLOTIDE 145 MCG PO CAPS: 145.0000 ug | ORAL_CAPSULE | Freq: Every day | ORAL | Status: DC

## 2015-12-16 MED ORDER — OMEPRAZOLE 40 MG PO CPDR: 40.0000 mg | DELAYED_RELEASE_CAPSULE | Freq: Every day | ORAL | Status: DC

## 2015-12-16 NOTE — Patient Instructions (Signed)

## 2015-12-16 NOTE — Progress Notes (Signed)
Name: Sharon Herring   MRN: [id number]    DOB: 01/08/1976   Date:12/16/2015       Progress Note  Subjective  Chief Complaint  Chief Complaint  Patient presents with  . Abdominal Pain    Patient presents with epigastric pain for a couple of weeks. The area of concern is very tender to the touch and there was a not. She has not been able to move her bowels as needed even with taking otc laxatives.  . Fatigue    Even with patient occasionally taking iron pills she has no energy and constantly feel tired.    HPI  Epigastric pain: she has noticed epigastric pain over the past 2 months, pain is described as dull or sharp, getting progressively worse and has been more intense and sharp. She has been taking Omeprazole prn and some Ranitidine without help. A couple days ago she had nausea and vomiting also. The smell of food does not bother her, no RUQ pain, appetite has been normal - but she was afriad of eating yesterday because of the pain. She denies heartburn sensation, no dysphagia or odynophagia. Weight has been stable. She has a long history of iron deficiency anemia  Chronic constipation: Bristol scale of 1, symptoms started at around age 65's, she needs to take Senakot or Miralax to have a bowel movement. Initially took medication sporadically, but now to have a normal bowel movement she must take medications. She does not strain if she takes a laxative, no blood in stools.   Fatigue: she has iron deficiency anemia and vitamin D deficiency, sleeping well, about 8 hours per night. Advised to continue supplementation for now.    Patient Active Problem List   Diagnosis Date Noted  . Menorrhagia 11/13/2015  . Constipation 09/23/2015  . Headache 09/23/2015  . Obesity, Class I, BMI 30.0-34.9 (see actual BMI) 06/02/2015  . Encounter for cholesteral screening for cardiovascular disease 06/02/2015  . Iron deficiency anemia 03/14/2014  . History of uterine fibroid 03/14/2014    Past Surgical  History  Procedure Laterality Date  . Uterine fibroid surgery  2012    Family History  Problem Relation Age of Onset  . Hypertension Mother   . Hypertension Father     Social History   Social History  . Marital Status: Single    Spouse Name: N/A  . Number of Children: N/A  . Years of Education: N/A   Occupational History  . Not on file.   Social History Main Topics  . Smoking status: Never Smoker   . Smokeless tobacco: Not on file  . Alcohol Use: No  . Drug Use: No  . Sexual Activity: Not Currently   Other Topics Concern  . Not on file   Social History Narrative     Current outpatient prescriptions:  Marland Kitchen  Vitamin D, Ergocalciferol, (DRISDOL) 50000 units CAPS capsule, Take 1 capsule (50,000 Units total) by mouth every 7 (seven) days., Disp: 12 capsule, Rfl: 0 .  docusate sodium (COLACE) 100 MG capsule, Take 1 capsule (100 mg total) by mouth daily. (Patient not taking: Reported on 12/16/2015), Disp: 90 capsule, Rfl: 3 .  ferrous sulfate 325 (65 FE) MG tablet, Take 1 tablet (325 mg total) by mouth daily with breakfast. (Patient not taking: Reported on 12/16/2015), Disp: 90 tablet, Rfl: 3 .  ibuprofen (ADVIL,MOTRIN) 800 MG tablet, Take 1 tablet (800 mg total) by mouth every 8 (eight) hours as needed. (Patient not taking: Reported on 12/16/2015), Disp: 30 tablet, Rfl:  1 .  omeprazole (PRILOSEC) 40 MG capsule, Take 1 capsule (40 mg total) by mouth daily., Disp: 30 capsule, Rfl: 0  Allergies  Allergen Reactions  . Aspirin Nausea And Vomiting  . Oxycodone-Acetaminophen Rash and Shortness Of Breath  . Tape Other (See Comments)    Burning feeling. Has left mark on skin.  . Norethindrone Rash     ROS  Ten systems reviewed and is negative except as mentioned in HPI   Objective  Filed Vitals:   12/16/15 1040  BP: 116/66  Pulse: 85  Temp: 98 F (36.7 C)  TempSrc: Oral  Resp: 16  Height: '5\' 5"'  (1.651 m)  Weight: 211 lb 8 oz (95.936 kg)  SpO2: 95%    Body mass index  is 35.2 kg/(m^2).  Physical Exam  Constitutional: Patient appears well-developed and well-nourished. Obese  No distress.  HEENT: head atraumatic, normocephalic, pupils equal and reactive to light,  neck supple, throat within normal limits Cardiovascular: Normal rate, regular rhythm and normal heart sounds.  No murmur heard. No BLE edema. Pulmonary/Chest: Effort normal and breath sounds normal. No respiratory distress. Abdominal: Soft.  There is  Tenderness on epigastric pain, no masses. Psychiatric: Patient has a normal mood and affect. behavior is normal. Judgment and thought content normal.  Recent Results (from the past 2160 hour(s))  POCT Urinalysis Dipstick     Status: Normal   Collection Time: 11/02/15  4:45 PM  Result Value Ref Range   Color, UA yellow    Clarity, UA clera    Glucose, UA neg    Bilirubin, UA neg    Ketones, UA neg    Spec Grav, UA 1.015    Blood, UA neg    pH, UA 5.5    Protein, UA neg    Urobilinogen, UA 0.2    Nitrite, UA neg    Leukocytes, UA Negative Negative  Urine culture     Status: None   Collection Time: 11/03/15  4:27 PM  Result Value Ref Range   Urine Culture, Routine Final report    Urine Culture result 1 Comment     Comment: Mixed urogenital flora 10,000-25,000 colony forming units per mL   Vitamin B12     Status: None   Collection Time: 12/02/15  8:59 AM  Result Value Ref Range   Vitamin B-12 203 180 - 914 pg/mL    Comment: (NOTE) This assay is not validated for testing neonatal or myeloproliferative syndrome specimens for Vitamin B12 levels. Performed at [facility] D 25 Hydroxy (Vit-D Deficiency, Fractures)     Status: Abnormal   Collection Time: 12/02/15  8:59 AM  Result Value Ref Range   Vit D, 25-Hydroxy 12.3 (L) 30.0 - 100.0 ng/mL    Comment: (NOTE) Vitamin D deficiency has been defined by the [place] practice guideline as a level of serum 25-OH vitamin D less than  20 ng/mL (1,2). The Endocrine Society went on to further define vitamin D insufficiency as a level between 21 and 29 ng/mL (2). 1. IOM (Institute of Medicine). 2010. Dietary reference   intakes for calcium and D. [place]: The   [employer]. 2. Holick MF, Binkley Winslow, Bischoff-Ferrari HA, et al.   Evaluation, treatment, and prevention of vitamin D   deficiency: an Endocrine Society clinical practice   guideline. JCEM. 2011 Jul; 96(7):1911-30. Performed At: [facility] [place], [place] [id number] Lindon Romp MD [id number]  CBC with Differential/Platelet     Status: Abnormal   Collection Time: 12/02/15  8:59 AM  Result Value Ref Range   WBC 5.4 3.6 - 11.0 K/uL   RBC 4.24 3.80 - 5.20 MIL/uL   Hemoglobin 10.6 (L) 12.0 - 16.0 g/dL   HCT 32.7 (L) 35.0 - 47.0 %   MCV 77.1 (L) 80.0 - 100.0 fL   MCH 24.9 (L) 26.0 - 34.0 pg   MCHC 32.3 32.0 - 36.0 g/dL   RDW 15.7 (H) 11.5 - 14.5 %   Platelets 445 (H) 150 - 440 K/uL   Neutrophils Relative % 65 %   Neutro Abs 3.5 1.4 - 6.5 K/uL   Lymphocytes Relative 26 %   Lymphs Abs 1.4 1.0 - 3.6 K/uL   Monocytes Relative 8 %   Monocytes Absolute 0.5 0.2 - 0.9 K/uL   Eosinophils Relative 1 %   Eosinophils Absolute 0.0 0 - 0.7 K/uL   Basophils Relative 0 %   Basophils Absolute 0.0 0 - 0.1 K/uL  TSH     Status: None   Collection Time: 12/02/15  8:59 AM  Result Value Ref Range   TSH 2.148 0.350 - 4.500 uIU/mL  Hemoglobin A1c     Status: Abnormal   Collection Time: 12/02/15  8:59 AM  Result Value Ref Range   Hgb A1c MFr Bld 6.1 (H) 4.0 - 6.0 %  Ferritin     Status: None   Collection Time: 12/02/15  8:59 AM  Result Value Ref Range   Ferritin 18 11 - 307 ng/mL  Comprehensive metabolic panel     Status: Abnormal   Collection Time: 12/02/15  8:59 AM  Result Value Ref Range   Sodium 136 135 - 145 mmol/L   Potassium 4.1 3.5 - 5.1 mmol/L   Chloride 106 101 - 111 mmol/L   CO2 27 22 - 32 mmol/L    Glucose, Bld 96 65 - 99 mg/dL   BUN 12 6 - 20 mg/dL   Creatinine, Ser 0.64 0.44 - 1.00 mg/dL   Calcium 9.2 8.9 - 10.3 mg/dL   Total Protein 7.5 6.5 - 8.1 g/dL   Albumin 3.5 3.5 - 5.0 g/dL   AST 14 (L) 15 - 41 U/L   ALT 11 (L) 14 - 54 U/L   Alkaline Phosphatase 56 38 - 126 U/L   Total Bilirubin 0.5 0.3 - 1.2 mg/dL   GFR calc non Af Amer >60 >60 mL/min   GFR calc Af Amer >60 >60 mL/min    Comment: (NOTE) The eGFR has been calculated using the CKD EPI equation. This calculation has not been validated in all clinical situations. eGFR's persistently <60 mL/min signify possible Chronic Kidney Disease.    Anion gap 3 (L) 5 - 15      PHQ2/9: Depression screen [facility] 2/9 12/16/2015 11/13/2015 06/02/2015 05/12/2015  Decreased Interest 0 0 0 0  Down, Depressed, Hopeless 0 0 0 0  PHQ - 2 Score 0 0 0 0     Fall Risk: Fall Risk  12/16/2015 11/13/2015 06/02/2015 05/12/2015  Falls in the past year? No No No No      Functional Status Survey: Is the patient deaf or have difficulty hearing?: No Does the patient have difficulty seeing, even when wearing glasses/contacts?: No Does the patient have difficulty concentrating, remembering, or making decisions?: No Does the patient have difficulty walking or climbing stairs?: No Does the patient have difficulty dressing or bathing?: No Does the patient have difficulty doing errands  alone such as visiting a doctor's office or shopping?: No    Assessment & Plan  1. Epigastric pain  - H. pylori antibody, IgG - omeprazole (PRILOSEC) 40 MG capsule; Take 1 capsule (40 mg total) by mouth daily.  Dispense: 30 capsule; Refill: 0 - hemoccult cards  2. Nausea and vomiting, vomiting of unspecified type  Advised bland diet, and medications above   3. Chronic constipation  - linaclotide (LINZESS) 145 MCG CAPS capsule; Take 1 capsule (145 mcg total) by mouth daily before breakfast.  Dispense: 30 capsule; Refill: 2

## 2015-12-17 ENCOUNTER — Other Ambulatory Visit
Admission: RE | Admit: 2015-12-17 | Discharge: 2015-12-17 | Disposition: A | Discharge status: Home / Self Care | Payer: 59 | Source: Ambulatory Visit | Attending: Family Medicine | Admitting: Family Medicine

## 2015-12-17 DIAGNOSIS — R1013 Epigastric pain: Secondary | ICD-10-CM | POA: Diagnosis not present

## 2015-12-18 LAB — H. PYLORI ANTIBODY, IGG: H Pylori IgG: <0.9 U/mL (ref 0.0–0.8)

## 2015-12-21 ENCOUNTER — Other Ambulatory Visit: Payer: Self-pay | Admitting: Family Medicine

## 2015-12-21 DIAGNOSIS — R1013 Epigastric pain: Secondary | ICD-10-CM

## 2015-12-22 ENCOUNTER — Ambulatory Visit
Admission: RE | Admit: 2015-12-22 | Discharge: 2015-12-22 | Disposition: A | Discharge status: Home / Self Care | Payer: 59 | Source: Ambulatory Visit | Attending: Family Medicine | Admitting: Family Medicine

## 2015-12-22 ENCOUNTER — Other Ambulatory Visit: Payer: Self-pay | Admitting: Family Medicine

## 2015-12-22 ENCOUNTER — Ambulatory Visit: Payer: 59

## 2015-12-22 DIAGNOSIS — R109 Unspecified abdominal pain: Secondary | ICD-10-CM | POA: Diagnosis not present

## 2015-12-22 DIAGNOSIS — K5909 Other constipation: Secondary | ICD-10-CM

## 2015-12-22 DIAGNOSIS — R932 Abnormal findings on diagnostic imaging of liver and biliary tract: Secondary | ICD-10-CM | POA: Insufficient documentation

## 2015-12-22 DIAGNOSIS — R1013 Epigastric pain: Secondary | ICD-10-CM

## 2015-12-22 DIAGNOSIS — R112 Nausea with vomiting, unspecified: Secondary | ICD-10-CM

## 2015-12-22 DIAGNOSIS — R110 Nausea: Secondary | ICD-10-CM | POA: Insufficient documentation

## 2015-12-22 DIAGNOSIS — K5900 Constipation, unspecified: Secondary | ICD-10-CM | POA: Insufficient documentation

## 2015-12-23 ENCOUNTER — Encounter
Admission: RE | Admit: 2015-12-23 | Discharge: 2015-12-23 | Disposition: A | Discharge status: Home / Self Care | Payer: 59 | Source: Ambulatory Visit | Attending: Family Medicine | Admitting: Family Medicine

## 2015-12-23 ENCOUNTER — Other Ambulatory Visit: Payer: Self-pay | Admitting: Family Medicine

## 2015-12-23 ENCOUNTER — Ambulatory Visit: Admission: RE | Admit: 2015-12-23 | Payer: 59 | Source: Ambulatory Visit

## 2015-12-23 DIAGNOSIS — R112 Nausea with vomiting, unspecified: Secondary | ICD-10-CM

## 2015-12-23 DIAGNOSIS — R1013 Epigastric pain: Secondary | ICD-10-CM | POA: Insufficient documentation

## 2015-12-23 DIAGNOSIS — R109 Unspecified abdominal pain: Secondary | ICD-10-CM | POA: Diagnosis not present

## 2015-12-23 DIAGNOSIS — R948 Abnormal results of function studies of other organs and systems: Secondary | ICD-10-CM

## 2015-12-23 DIAGNOSIS — R110 Nausea: Secondary | ICD-10-CM | POA: Diagnosis not present

## 2015-12-23 MED ORDER — SINCALIDE 5 MCG IJ SOLR
0.0200 ug/kg | Freq: Once | INTRAMUSCULAR | Status: AC
  Administered 2015-12-23: 1.9 ug via INTRAVENOUS
  Filled 2015-12-23: qty 5

## 2015-12-23 MED ORDER — TECHNETIUM TC 99M MEBROFENIN IV KIT
5.0000 | PACK | Freq: Once | INTRAVENOUS | Status: AC | PRN
  Administered 2015-12-23: 5.366 via INTRAVENOUS

## 2015-12-25 ENCOUNTER — Encounter: Payer: Self-pay | Admitting: Family Medicine

## 2015-12-28 ENCOUNTER — Ambulatory Visit: Payer: Self-pay | Admitting: General Surgery

## 2015-12-30 ENCOUNTER — Ambulatory Visit (INDEPENDENT_AMBULATORY_CARE_PROVIDER_SITE_OTHER): Payer: 59 | Admitting: General Surgery

## 2015-12-30 ENCOUNTER — Encounter: Payer: Self-pay | Admitting: General Surgery

## 2015-12-30 VITALS — BP 118/76 | HR 76 | Resp 14 | Ht 65.0 in | Wt 216.0 lb

## 2015-12-30 DIAGNOSIS — R1013 Epigastric pain: Secondary | ICD-10-CM

## 2015-12-30 DIAGNOSIS — K828 Other specified diseases of gallbladder: Secondary | ICD-10-CM

## 2015-12-30 DIAGNOSIS — R112 Nausea with vomiting, unspecified: Secondary | ICD-10-CM | POA: Diagnosis not present

## 2015-12-30 NOTE — Patient Instructions (Addendum)
Upper Gastrointestinal Series An upper gastrointestinal series is an X-ray test. This test checks for problems in the esophagus, stomach, and the first part of the small intestine (upper gastrointestinal tract).  BEFORE THE PROCEDURE  Several days before the test, you may need to change what you eat. Follow your doctor's directions.  The night before the test, do not eat or drink anything after midnight.  Ask your doctor if you can take medicines you need with a sip of water.  If you have diabetes and need to take insulin, ask your doctor for instructions on how to do this before the test. PROCEDURE  You will be awake during the test. You will not need pain medicine. The test takes about 1 hour.  You will drink barium. Barium is like a milkshake that tastes like chalk.  You may also swallow "fizzies" to put air in your stomach.  X-ray pictures will be taken. You may need to stand up or lie on a table. The table may move or tilt. You may need to turn from side to side.  The test is done when all the needed pictures have been taken. AFTER THE PROCEDURE  You may go back to your normal diet and activities right away.  Ask when your test results will be ready. Follow up with your doctor to discuss your test results.   This information is not intended to replace advice given to you by your health care provider. Make sure you discuss any questions you have with your health care provider.   Document Released: 10/31/2011 Document Reviewed: 02/20/2015 Elsevier Interactive Patient Education 2016 Elsevier Inc.    Laparoscopic Cholecystectomy Laparoscopic cholecystectomy is surgery to remove the gallbladder. The gallbladder is located in the upper right part of the abdomen, behind the liver. It is a storage sac for bile, which is produced in the liver. Bile aids in the digestion and absorption of fats. Cholecystectomy is often done for inflammation of the gallbladder (cholecystitis). This  condition is usually caused by a buildup of gallstones (cholelithiasis) in the gallbladder. Gallstones can block the flow of bile, and that can result in inflammation and pain. In severe cases, emergency surgery may be required. If emergency surgery is not required, you will have time to prepare for the procedure. Laparoscopic surgery is an alternative to open surgery. Laparoscopic surgery has a shorter recovery time. Your common bile duct may also need to be examined during the procedure. If stones are found in the common bile duct, they may be removed. LET [facility] CARE PROVIDER KNOW ABOUT:  Any allergies you have.  All medicines you are taking, including vitamins, herbs, eye drops, creams, and over-the-counter medicines.  Previous problems you or members of your family have had with the use of anesthetics.  Any blood disorders you have.  Previous surgeries you have had.  Any medical conditions you have. RISKS AND COMPLICATIONS Generally, this is a safe procedure. However, problems may occur, including:  Infection.  Bleeding.  Allergic reactions to medicines.  Damage to other structures or organs.  A stone remaining in the common bile duct.  A bile leak from the cyst duct that is clipped when your gallbladder is removed.  The need to convert to open surgery, which requires a larger incision in the abdomen. This may be necessary if your surgeon thinks that it is not safe to continue with a laparoscopic procedure. BEFORE THE PROCEDURE  Ask your health care provider about:  Changing or stopping your regular  medicines. This is especially important if you are taking diabetes medicines or blood thinners.  Taking medicines such as aspirin and ibuprofen. These medicines can thin your blood. Do not take these medicines before your procedure if your health care provider instructs you not to.  Follow instructions from your health care provider about eating or drinking  restrictions.  Let your health care provider know if you develop a cold or an infection before surgery.  Plan to have someone take you home after the procedure.  Ask your health care provider how your surgical site will be marked or identified.  You may be given antibiotic medicine to help prevent infection. PROCEDURE  To reduce your risk of infection:  Your health care team will wash or sanitize their hands.  Your skin will be washed with soap.  An IV tube may be inserted into one of your veins.  You will be given a medicine to make you fall asleep (general anesthetic).  A breathing tube will be placed in your mouth.  The surgeon will make several small cuts (incisions) in your abdomen.  A thin, lighted tube (laparoscope) that has a tiny camera on the end will be inserted through one of the small incisions. The camera on the laparoscope will send a picture to a TV screen (monitor) in the operating room. This will give the surgeon a good view inside your abdomen.  A gas will be pumped into your abdomen. This will expand your abdomen to give the surgeon more room to perform the surgery.  Other tools that are needed for the procedure will be inserted through the other incisions. The gallbladder will be removed through one of the incisions.  After your gallbladder has been removed, the incisions will be closed with stitches (sutures), staples, or skin glue.  Your incisions may be covered with a bandage (dressing). The procedure may vary among health care providers and hospitals. AFTER THE PROCEDURE  Your blood pressure, heart rate, breathing rate, and blood oxygen level will be monitored often until the medicines you were given have worn off.  You will be given medicines as needed to control your pain.   This information is not intended to replace advice given to you by your health care provider. Make sure you discuss any questions you have with your health care provider.    Document Released: 08/08/2005 Document Revised: 04/29/2015 Document Reviewed: 03/20/2013 Elsevier Interactive Patient Education [employer].

## 2015-12-30 NOTE — Progress Notes (Signed)
Patient ID: Sharon Herring, female   DOB: 1975/11/03, 40 y.o.   MRN: [id number]  Chief Complaint  Patient presents with  . Abdominal Pain    Sharon Herring is a 40 y.o. female here today for a evaluation of abdominal pain. Patient states she has been having pain in her epigastric area for a couple of weeks.She reports a sense of early satiety, being full with only 4-5 bites of food. Soft foods such as mashed potatoes and grits are better tolerated than solid foods. Nausea and early satiety have been more prevalent and vomiting. No history of diarrhea. She had a ultrasound done on 12/22/15 and HIDA on 12/23/15.   She normally moves her bowls two times weekly.  No history of fever or chills. No previous episodes.  She underwent laparoscopy with plans for a uterine fibroid excision which turned and normal laparotomy and at that time incidental appendectomy was completed.  The patient has made use of when necessary doses of ibuprofen, but is not taking the presently recorded dose on her medication list.  The patient is accompanied by her coworker, Hollie Salk, RN.  I I personally reviewed the patient's history. Sharon  Past Medical History  Diagnosis Date  . Fibroid uterus   . Anemia     Past Surgical History  Procedure Laterality Date  . Uterine fibroid surgery  2012  . Appendectomy  2012    Family History  Problem Relation Age of Onset  . Hypertension Mother   . Hypertension Father     Social History Social History  Substance Use Topics  . Smoking status: Never Smoker   . Smokeless tobacco: None  . Alcohol Use: No    Allergies  Allergen Reactions  . Aspirin Nausea And Vomiting  . Oxycodone-Acetaminophen Rash and Shortness Of Breath  . Tape Other (See Comments)    Burning feeling. Has left mark on skin.  . Norethindrone Rash    Current Outpatient Prescriptions  Medication Sig Dispense Refill  . docusate sodium (COLACE) 100 MG capsule Take 1 capsule (100 mg total) by  mouth daily. 90 capsule 3  . ferrous sulfate 325 (65 FE) MG tablet Take 1 tablet (325 mg total) by mouth daily with breakfast. 90 tablet 3  . ibuprofen (ADVIL,MOTRIN) 800 MG tablet Take 1 tablet (800 mg total) by mouth every 8 (eight) hours as needed. 30 tablet 1  . linaclotide (LINZESS) 145 MCG CAPS capsule Take 1 capsule (145 mcg total) by mouth daily before breakfast. 30 capsule 2  . omeprazole (PRILOSEC) 40 MG capsule Take 1 capsule (40 mg total) by mouth daily. 30 capsule 0  . Vitamin D, Ergocalciferol, (DRISDOL) 50000 units CAPS capsule Take 1 capsule (50,000 Units total) by mouth every 7 (seven) days. 12 capsule 0   No current facility-administered medications for this visit.    Review of Systems Review of Systems  Constitutional: Negative.   Respiratory: Negative.   Cardiovascular: Negative.   Gastrointestinal: Positive for nausea, vomiting and abdominal pain.    Blood pressure 118/76, pulse 76, resp. rate 14, height 5\' 5"  (1.651 m), weight 216 lb (97.977 kg), last menstrual period 12/02/2015.  Physical Exam Physical Exam  Constitutional: She is oriented to person, place, and time. She appears well-developed.  Eyes: Conjunctivae are normal. No scleral icterus.  Neck: Neck supple.  Cardiovascular: Normal rate and normal heart sounds.   Pulmonary/Chest: Effort normal and breath sounds normal.  Abdominal: Soft. Normal appearance and bowel sounds are normal. She exhibits  no distension, no ascites and no mass. There is tenderness in the right upper quadrant and epigastric area. A hernia (small umbilical hernia) is present.    Lymphadenopathy:    She has no cervical adenopathy.  Neurological: She is alert and oriented to person, place, and time.  Skin: Skin is warm and dry.    Data Reviewed Abdominal ultrasound dated 12/22/2015 was reviewed. No evidence of cholelithiasis or gallbladder wall thickening. Minimal change in diameter compared to 2016 study. Fatty infiltration of  liver reported by the radiology service.  HIDA scan with CCK injection dated 12/23/2015 showed an incredibly low ejection fraction of 3%. The patient reports that during the CCK infusion she had reproduction of her epigastric pain, although somewhat more intense and she had previously experienced.   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  Early satiety with nausea and vomiting, no risk factors for peptic ulcer disease.    Plan    Prior to consideration of elective cholecystectomy have recommended an upper GI to confirm there is no primary gastric or duodenal source for her symptoms.  Patient has been scheduled for an upper GI at [facility] for 01-01-16 arrive at 9:45 am. Prep: NPO after midnight.   This patient will contact the office for results of upper GI and let us know if she would like to have her gallbladder out at that time.   If her upper GI series is normal we can consider elective cholecystectomy, expecting a 60-70% likelihood of resolution of her symptoms.     PCP:  [place] This information has been scribed by Gaspar Cola CMA.   Robert Bellow 12/31/2015, 7:55 AM

## 2015-12-31 DIAGNOSIS — K828 Other specified diseases of gallbladder: Secondary | ICD-10-CM | POA: Insufficient documentation

## 2015-12-31 DIAGNOSIS — R1013 Epigastric pain: Secondary | ICD-10-CM | POA: Insufficient documentation

## 2015-12-31 DIAGNOSIS — R112 Nausea with vomiting, unspecified: Secondary | ICD-10-CM | POA: Insufficient documentation

## 2016-01-01 ENCOUNTER — Ambulatory Visit
Admission: RE | Admit: 2016-01-01 | Discharge: 2016-01-01 | Disposition: A | Discharge status: Home / Self Care | Payer: 59 | Source: Ambulatory Visit | Attending: General Surgery | Admitting: General Surgery

## 2016-01-01 DIAGNOSIS — K449 Diaphragmatic hernia without obstruction or gangrene: Secondary | ICD-10-CM | POA: Insufficient documentation

## 2016-01-01 DIAGNOSIS — R1013 Epigastric pain: Secondary | ICD-10-CM | POA: Diagnosis not present

## 2016-01-01 DIAGNOSIS — R112 Nausea with vomiting, unspecified: Secondary | ICD-10-CM | POA: Insufficient documentation

## 2016-01-01 DIAGNOSIS — R938 Abnormal findings on diagnostic imaging of other specified body structures: Secondary | ICD-10-CM | POA: Insufficient documentation

## 2016-01-04 ENCOUNTER — Telehealth: Payer: Self-pay | Admitting: *Deleted

## 2016-01-04 NOTE — Telephone Encounter (Signed)
-----   Message from Robert Bellow, MD sent at 01/04/2016  4:51 PM EDT ----- Please notify the patient her stomach does not appear to be emptying normally.  She needs an upper endoscopy.  This can be completed thru our office or with GI MD of her choice. Schedule if she is amenable (Wednesday?)  ----- Message -----    From: Rad Results In Interface    Sent: 01/01/2016  11:13 AM      To: Robert Bellow, MD

## 2016-01-04 NOTE — Telephone Encounter (Signed)
Patient contacted today and notified as instructed.   This patient wishes to have the upper endoscopy completed thru our office.

## 2016-01-05 ENCOUNTER — Other Ambulatory Visit: Payer: Self-pay | Admitting: General Surgery

## 2016-01-05 ENCOUNTER — Ambulatory Visit: Payer: Self-pay | Admitting: General Surgery

## 2016-01-05 DIAGNOSIS — R6881 Early satiety: Secondary | ICD-10-CM

## 2016-01-05 NOTE — Telephone Encounter (Signed)
Patient notified of upper endoscopy at [facility] scheduled for 01/06/16. She is aware to call for her arrival time today between 1 and 3 pm. instructions reviewed with patient. Patient is aware of date and all instructions.

## 2016-01-05 NOTE — H&P (Signed)
HPI Sharon Herring is a 40 y.o. female here today for a evaluation of abdominal pain. Patient states she has been having pain in her epigastric area for a couple of weeks.She reports a sense of early satiety, being full with only 4-5 bites of food. Soft foods such as mashed potatoes and grits are better tolerated than solid foods. Nausea and early satiety have been more prevalent and vomiting. No history of diarrhea. She had a ultrasound done on 12/22/15 and HIDA on 12/23/15. She normally moves her bowls two times weekly. No history of fever or chills. No previous episodes.  She underwent laparoscopy with plans for a uterine fibroid excision which turned and normal laparotomy and at that time incidental appendectomy was completed.  The patient has made use of when necessary doses of ibuprofen, but is not taking the presently recorded dose on her medication list.  The patient is accompanied by her coworker, Hollie Salk, RN.  I I personally reviewed the patient's history. HPI  Past Medical History  Diagnosis Date  . Fibroid uterus   . Anemia     Past Surgical History  Procedure Laterality Date  . Uterine fibroid surgery  2012  . Appendectomy  2012    Family History  Problem Relation Age of Onset  . Hypertension Mother   . Hypertension Father     Social History Social History  Substance Use Topics  . Smoking status: Never Smoker   . Smokeless tobacco: None  . Alcohol Use: No    Allergies  Allergen Reactions  . Aspirin Nausea And Vomiting  . Oxycodone-Acetaminophen Rash and Shortness Of Breath  . Tape Other (See Comments)    Burning feeling. Has left mark on skin.  . Norethindrone Rash    Current Outpatient Prescriptions  Medication Sig Dispense Refill  . docusate sodium (COLACE) 100 MG capsule Take 1 capsule (100 mg total) by mouth daily. 90 capsule 3  . ferrous sulfate 325 (65 FE) MG  tablet Take 1 tablet (325 mg total) by mouth daily with breakfast. 90 tablet 3  . ibuprofen (ADVIL,MOTRIN) 800 MG tablet Take 1 tablet (800 mg total) by mouth every 8 (eight) hours as needed. 30 tablet 1  . linaclotide (LINZESS) 145 MCG CAPS capsule Take 1 capsule (145 mcg total) by mouth daily before breakfast. 30 capsule 2  . omeprazole (PRILOSEC) 40 MG capsule Take 1 capsule (40 mg total) by mouth daily. 30 capsule 0  . Vitamin D, Ergocalciferol, (DRISDOL) 50000 units CAPS capsule Take 1 capsule (50,000 Units total) by mouth every 7 (seven) days. 12 capsule 0   No current facility-administered medications for this visit.    Review of Systems Review of Systems  Constitutional: Negative.  Respiratory: Negative.  Cardiovascular: Negative.  Gastrointestinal: Positive for nausea, vomiting and abdominal pain.    Blood pressure 118/76, pulse 76, resp. rate 14, height 5\' 5"  (1.651 m), weight 216 lb (97.977 kg), last menstrual period 12/02/2015.  Physical Exam Physical Exam  Constitutional: She is oriented to person, place, and time. She appears well-developed.  Eyes: Conjunctivae are normal. No scleral icterus.  Neck: Neck supple.  Cardiovascular: Normal rate and normal heart sounds.  Pulmonary/Chest: Effort normal and breath sounds normal.  Abdominal: Soft. Normal appearance and bowel sounds are normal. She exhibits no distension, no ascites and no mass. There is tenderness in the right upper quadrant and epigastric area. A hernia (small umbilical hernia) is present.    Lymphadenopathy:   She has no cervical adenopathy.  Neurological: She is alert and oriented to person, place, and time.  Skin: Skin is warm and dry.    Data Reviewed Abdominal ultrasound dated 12/22/2015 was reviewed. No evidence of cholelithiasis or gallbladder wall thickening. Minimal change in diameter compared to 2016 study. Fatty infiltration of liver reported by the radiology  service.  HIDA scan with CCK injection dated 12/23/2015 showed an incredibly low ejection fraction of 3%. The patient reports that during the CCK infusion she had reproduction of her epigastric pain, although somewhat more intense and she had previously experienced.   IMPRESSION: 1. Small reducible hiatal hernia without evidence of stricture nor esophagitis. 2. Normally distensible stomach with normal mucosal pattern. Gastric emptying however appeared delayed with only a small amount exiting into the duodenum. This could reflect edema of the pyloric channel due to an occult ulcer or other pathology. Consideration for performing a nuclear medicine gastric emptying study is recommended. 3. The visualized portions of the duodenum were grossly normal but adequate distention of the duodenum was not obtained. 4. Upper endoscopy will be will be indicated if the patient's symptoms persist and remain unexplained.   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  Early satiety with nausea and vomiting, no risk factors for peptic ulcer disease.   Abnormal UGI  Plan    EGD    PCP: Duane Boston, [place] Gleason

## 2016-01-06 ENCOUNTER — Ambulatory Visit
Admission: RE | Admit: 2016-01-06 | Discharge: 2016-01-06 | Disposition: A | Discharge status: Home / Self Care | Payer: 59 | Source: Ambulatory Visit | Attending: General Surgery | Admitting: General Surgery

## 2016-01-06 ENCOUNTER — Ambulatory Visit: Payer: 59 | Admitting: Anesthesiology

## 2016-01-06 ENCOUNTER — Encounter: Payer: Self-pay | Admitting: *Deleted

## 2016-01-06 ENCOUNTER — Encounter
Admission: RE | Disposition: A | Discharge status: Home / Self Care | Payer: Self-pay | Source: Ambulatory Visit | Attending: General Surgery

## 2016-01-06 DIAGNOSIS — D490 Neoplasm of unspecified behavior of digestive system: Secondary | ICD-10-CM | POA: Diagnosis not present

## 2016-01-06 DIAGNOSIS — K219 Gastro-esophageal reflux disease without esophagitis: Secondary | ICD-10-CM | POA: Insufficient documentation

## 2016-01-06 DIAGNOSIS — R112 Nausea with vomiting, unspecified: Secondary | ICD-10-CM | POA: Diagnosis not present

## 2016-01-06 DIAGNOSIS — R1013 Epigastric pain: Secondary | ICD-10-CM | POA: Diagnosis not present

## 2016-01-06 DIAGNOSIS — D131 Benign neoplasm of stomach: Secondary | ICD-10-CM | POA: Diagnosis not present

## 2016-01-06 DIAGNOSIS — K317 Polyp of stomach and duodenum: Secondary | ICD-10-CM

## 2016-01-06 DIAGNOSIS — R109 Unspecified abdominal pain: Secondary | ICD-10-CM | POA: Diagnosis not present

## 2016-01-06 DIAGNOSIS — R6881 Early satiety: Secondary | ICD-10-CM

## 2016-01-06 HISTORY — DX: Polyp of stomach and duodenum: K31.7

## 2016-01-06 HISTORY — PX: ESOPHAGOGASTRODUODENOSCOPY (EGD) WITH PROPOFOL: SHX5813

## 2016-01-06 LAB — POCT PREGNANCY, URINE: Preg Test, Ur: NEGATIVE

## 2016-01-06 SURGERY — ESOPHAGOGASTRODUODENOSCOPY (EGD) WITH PROPOFOL
Anesthesia: General

## 2016-01-06 MED ORDER — PROPOFOL 10 MG/ML IV BOLUS
INTRAVENOUS | Status: DC | PRN
  Administered 2016-01-06: 100 mg via INTRAVENOUS

## 2016-01-06 MED ORDER — PROPOFOL 500 MG/50ML IV EMUL
INTRAVENOUS | Status: DC | PRN
  Administered 2016-01-06: 200 ug/kg/min via INTRAVENOUS

## 2016-01-06 MED ORDER — MIDAZOLAM HCL 5 MG/5ML IJ SOLN
INTRAMUSCULAR | Status: DC | PRN
  Administered 2016-01-06: 1 mg via INTRAVENOUS

## 2016-01-06 MED ORDER — LIDOCAINE HCL (CARDIAC) 20 MG/ML IV SOLN: INTRAVENOUS | Status: DC | PRN

## 2016-01-06 MED ORDER — SODIUM CHLORIDE 0.9 % IV SOLN
INTRAVENOUS | Status: DC
  Administered 2016-01-06 (×2): via INTRAVENOUS

## 2016-01-06 MED ORDER — FENTANYL CITRATE (PF) 100 MCG/2ML IJ SOLN
INTRAMUSCULAR | Status: DC | PRN
  Administered 2016-01-06: 50 ug via INTRAVENOUS

## 2016-01-06 MED ORDER — LIDOCAINE 2% (20 MG/ML) 5 ML SYRINGE
INTRAMUSCULAR | Status: DC | PRN
  Administered 2016-01-06: 40 mg via INTRAVENOUS

## 2016-01-06 NOTE — Transfer of Care (Signed)
Immediate Anesthesia Transfer of Care Note  Patient: Sharon Herring  Procedure(s) Performed: Procedure(s): ESOPHAGOGASTRODUODENOSCOPY (EGD) WITH PROPOFOL (N/A)  Patient Location: PACU and Endoscopy Unit  Anesthesia Type:General  Level of Consciousness: awake, oriented and patient cooperative  Airway & Oxygen Therapy: Patient Spontanous Breathing and Patient connected to nasal cannula oxygen  Post-op Assessment: Report given to RN and Post -op Vital signs reviewed and stable  Post vital signs: Reviewed and stable  Last Vitals:  Filed Vitals:   01/06/16 1317  BP: 123/87  Pulse: 70  Temp: 36 C  Resp: 12    Last Pain: There were no vitals filed for this visit.       Complications: No apparent anesthesia complications

## 2016-01-06 NOTE — Op Note (Signed)
[facility] Gastroenterology Patient Name: Sharon Herring Procedure Date: 01/06/2016 2:36 PM MRN: [id number] Account #: [id number] Date of Birth: [birth year] Admit Type: Outpatient Age: 40 Room: [facility] ENDO ROOM 1 Gender: Female Note Status: Finalized Procedure:            Upper GI endoscopy Indications:          Epigastric abdominal pain Providers:            Robert Bellow, MD Referring MD:         Bethena Roys. Sowles, MD (Referring MD) Medicines:            Monitored Anesthesia Care Complications:        No immediate complications. Procedure:            Pre-Anesthesia Assessment:                       - Prior to the procedure, a History and Physical was                        performed, and patient medications, allergies and                        sensitivities were reviewed. The patient's tolerance of                        previous anesthesia was reviewed.                       - The risks and benefits of the procedure and the                        sedation options and risks were discussed with the                        patient. All questions were answered and informed                        consent was obtained.                       After obtaining informed consent, the endoscope was                        passed under direct vision. Throughout the procedure,                        the patient's blood pressure, pulse, and oxygen                        saturations were monitored continuously. The Endoscope                        was introduced through the mouth, and advanced to the                        second part of duodenum. The upper GI endoscopy was                        accomplished without difficulty. The patient tolerated  the procedure well. Findings:      The esophagus was normal.      The examined duodenum was normal.      Two 8 to 15 mm semi-sessile polyps with no bleeding and no stigmata of       recent bleeding were  found on the lesser curvature of the stomach and at       the pylorus. The polyp was removed with a hot snare. Resection and       retrieval were complete. To prevent bleeding post-intervention, two       hemostatic clips were successfully placed (MR conditional). There was no       bleeding at the end of the procedure. Impression:           - Normal esophagus.                       - Normal examined duodenum.                       - Two gastric polyps. Resected and retrieved. Clips (MR                        conditional) were placed. Recommendation:       - Return to my office in 1 week. Procedure Code(s):    --- Professional ---                       [phone], Esophagogastroduodenoscopy, flexible, transoral;                        with removal of tumor(s), polyp(s), or other lesion(s)                        by snare technique Diagnosis Code(s):    --- Professional ---                       K31.7, Polyp of stomach and duodenum                       R10.13, Epigastric pain CPT copyright 2016 American Medical Association. All rights reserved. The codes documented in this report are preliminary and upon coder review may  be revised to meet current compliance requirements. Robert Bellow, MD 01/06/2016 3:10:28 PM This report has been signed electronically. Number of Addenda: 0 Note Initiated On: 01/06/2016 2:36 PM      [facility]

## 2016-01-06 NOTE — Anesthesia Preprocedure Evaluation (Signed)
Anesthesia Evaluation  Patient identified by MRN, date of birth, ID band Patient awake    Reviewed: Allergy & Precautions, H&P , NPO status , Patient's Chart, lab work & pertinent test results, reviewed documented beta blocker date and time   Airway Mallampati: IV  TM Distance: >3 FB Neck ROM: full    Dental no notable dental hx. (+) Partial Upper   Pulmonary neg pulmonary ROS,    Pulmonary exam normal breath sounds clear to auscultation       Cardiovascular Exercise Tolerance: Good negative cardio ROS Normal cardiovascular exam Rhythm:regular Rate:Normal     Neuro/Psych negative neurological ROS  negative psych ROS   GI/Hepatic Neg liver ROS, GERD  ,  Endo/Other  negative endocrine ROS  Renal/GU negative Renal ROS  negative genitourinary   Musculoskeletal   Abdominal   Peds  Hematology  (+) Blood dyscrasia, anemia ,   Anesthesia Other Findings Past Medical History:   Fibroid uterus                                               Anemia                                                       Reproductive/Obstetrics negative OB ROS                             Anesthesia Physical Anesthesia Plan  ASA: II  Anesthesia Plan: General   Post-op Pain Management:    Induction:   Airway Management Planned:   Additional Equipment:   Intra-op Plan:   Post-operative Plan:   Informed Consent: I have reviewed the patients History and Physical, chart, labs and discussed the procedure including the risks, benefits and alternatives for the proposed anesthesia with the patient or authorized representative who has indicated his/her understanding and acceptance.   Dental Advisory Given  Plan Discussed with: Anesthesiologist, CRNA and Surgeon  Anesthesia Plan Comments:         Anesthesia Quick Evaluation

## 2016-01-06 NOTE — H&P (Signed)
No change in clinical history or exam. Abnormal UGI with markedly delayed gastric emptying. For EGD.

## 2016-01-07 ENCOUNTER — Ambulatory Visit: Payer: Self-pay | Admitting: General Surgery

## 2016-01-07 ENCOUNTER — Ambulatory Visit (INDEPENDENT_AMBULATORY_CARE_PROVIDER_SITE_OTHER): Payer: 59 | Admitting: General Surgery

## 2016-01-07 ENCOUNTER — Encounter: Payer: Self-pay | Admitting: General Surgery

## 2016-01-07 VITALS — BP 130/66 | HR 66 | Temp 97.5°F | Resp 14 | Ht 64.0 in | Wt 212.0 lb

## 2016-01-07 DIAGNOSIS — K317 Polyp of stomach and duodenum: Secondary | ICD-10-CM

## 2016-01-07 DIAGNOSIS — R1013 Epigastric pain: Secondary | ICD-10-CM

## 2016-01-07 NOTE — Progress Notes (Signed)
Patient ID: Sharon Herring, female   DOB: 1976/06/28, 40 y.o.   MRN: [id number]  Chief Complaint  Patient presents with  . Follow-up    HPI Sharon Herring is a 40 y.o. female here today following up from her endoscopy done on 01/06/16. Patient states she has been vomiting and having an cramping pain in her left side with some nauseousness started last night 9pm. The patient did come over from work at [employer] for evaluation.  The patient underwent upper endoscopy yesterday to evaluate her symptoms of early satiety and postprandial pain not thought purely related to biliary dyskinesia. She was found to have a small 8 mm semi-sessile polyp at the pylorus that could've been producing intermittent obstruction as well as a larger polyp on the lesser curvature. Both were removed with endoscopic snare. No bleeding was noted at the time of the procedure.  The patient's episodic vomiting is unchanged from what she experienced prior to the procedure. Not a fevers of which she is aware.      HPI  Past Medical History  Diagnosis Date  . Fibroid uterus   . Anemia     Past Surgical History  Procedure Laterality Date  . Uterine fibroid surgery  2012  . Appendectomy  2012  . Esophagogastroduodenoscopy (egd) with propofol N/A 01/06/2016    Procedure: ESOPHAGOGASTRODUODENOSCOPY (EGD) WITH PROPOFOL;  Surgeon: Robert Bellow, MD;  Location: [facility] ENDOSCOPY;  Service: Endoscopy;  Laterality: N/A;    Family History  Problem Relation Age of Onset  . Hypertension Mother   . Hypertension Father     Social History Social History  Substance Use Topics  . Smoking status: Never Smoker   . Smokeless tobacco: None  . Alcohol Use: No    Allergies  Allergen Reactions  . Aspirin Nausea And Vomiting  . Oxycodone-Acetaminophen Rash and Shortness Of Breath  . Tape Other (See Comments)    Burning feeling. Has left mark on skin.  . Norethindrone Rash    Current Outpatient Prescriptions  Medication  Sig Dispense Refill  . docusate sodium (COLACE) 100 MG capsule Take 1 capsule (100 mg total) by mouth daily. 90 capsule 3  . ferrous sulfate 325 (65 FE) MG tablet Take 1 tablet (325 mg total) by mouth daily with breakfast. 90 tablet 3  . linaclotide (LINZESS) 145 MCG CAPS capsule Take 1 capsule (145 mcg total) by mouth daily before breakfast. 30 capsule 2  . omeprazole (PRILOSEC) 40 MG capsule Take 1 capsule (40 mg total) by mouth daily. 30 capsule 0  . Vitamin D, Ergocalciferol, (DRISDOL) 50000 units CAPS capsule Take 1 capsule (50,000 Units total) by mouth every 7 (seven) days. 12 capsule 0   No current facility-administered medications for this visit.    Review of Systems Review of Systems  Constitutional: Negative.   Respiratory: Negative.   Cardiovascular: Negative.   Gastrointestinal: Positive for nausea, vomiting and abdominal pain.    Blood pressure 130/66, pulse 66, temperature 97.5 F (36.4 C), resp. rate 14, height 5\' 4"  (1.626 m), weight 212 lb (96.163 kg), last menstrual period 12/31/2015.  Physical Exam Physical Exam  Constitutional: She is oriented to person, place, and time. She appears well-developed and well-nourished.  Eyes: Conjunctivae are normal. No scleral icterus.  Neck: Neck supple.  Cardiovascular: Normal rate, regular rhythm and normal heart sounds.   Pulmonary/Chest: Effort normal and breath sounds normal.  Abdominal: Soft. Normal appearance and bowel sounds are normal. There is no hepatomegaly. There is tenderness in the  epigastric area and left lower quadrant.  Lymphadenopathy:    She has no cervical adenopathy.  Neurological: She is alert and oriented to person, place, and time.  Skin: Skin is warm and dry.    Data Reviewed Pathology pending.  Assessment    Minimal change in exam since pre-endoscopy. No evidence of perforation.    Plan    Symptomatic management pending pathology review.    PCP:  Steele Sizer This information has been  scribed by Gaspar Cola CMA.   Robert Bellow 01/08/2016, 7:11 AM

## 2016-01-07 NOTE — Anesthesia Postprocedure Evaluation (Signed)
Anesthesia Post Note  Patient: Sharon Herring  Procedure(s) Performed: Procedure(s) (LRB): ESOPHAGOGASTRODUODENOSCOPY (EGD) WITH PROPOFOL (N/A)  Patient location during evaluation: Endoscopy Anesthesia Type: General Level of consciousness: awake and alert Pain management: pain level controlled Vital Signs Assessment: post-procedure vital signs reviewed and stable Respiratory status: spontaneous breathing, nonlabored ventilation, respiratory function stable and patient connected to nasal cannula oxygen Cardiovascular status: blood pressure returned to baseline and stable Postop Assessment: no signs of nausea or vomiting Anesthetic complications: no    Last Vitals:  Filed Vitals:   01/06/16 1535 01/06/16 1544  BP: 129/101 134/96  Pulse: 66 65  Temp:    Resp: 13 17    Last Pain: There were no vitals filed for this visit.               Martha Clan

## 2016-01-08 ENCOUNTER — Telehealth: Payer: Self-pay

## 2016-01-08 DIAGNOSIS — K317 Polyp of stomach and duodenum: Secondary | ICD-10-CM | POA: Insufficient documentation

## 2016-01-08 LAB — SURGICAL PATHOLOGY

## 2016-01-08 NOTE — Telephone Encounter (Signed)
-----   Message from Robert Bellow, MD sent at 01/08/2016 11:08 AM EDT ----- Please notify the patient pathology benign.  F/U as scheduled.  ----- Message -----    From: [facility]    Sent: 01/08/2016  10:49 AM      To: Robert Bellow, MD

## 2016-01-08 NOTE — Telephone Encounter (Signed)
Notified patient as instructed, patient pleased. Discussed follow-up appointments, patient agrees  

## 2016-01-13 ENCOUNTER — Encounter: Payer: Self-pay | Admitting: General Surgery

## 2016-01-13 ENCOUNTER — Ambulatory Visit (INDEPENDENT_AMBULATORY_CARE_PROVIDER_SITE_OTHER): Payer: 59 | Admitting: General Surgery

## 2016-01-13 VITALS — BP 128/80 | HR 60 | Resp 12 | Ht 64.0 in | Wt 213.0 lb

## 2016-01-13 DIAGNOSIS — R1013 Epigastric pain: Secondary | ICD-10-CM

## 2016-01-13 DIAGNOSIS — K317 Polyp of stomach and duodenum: Secondary | ICD-10-CM

## 2016-01-13 MED ORDER — DICYCLOMINE HCL 20 MG PO TABS: 20.0000 mg | ORAL_TABLET | Freq: Three times a day (TID) | ORAL | Status: DC

## 2016-01-13 NOTE — Progress Notes (Signed)
Patient ID: Sharon Herring, female   DOB: 10-12-1975, 40 y.o.   MRN: [id number]  Chief Complaint  Patient presents with  . Follow-up    upper endo    HPI Sharon Herring [name] is a 40 y.o. female.  Here today for follow up upper endoscopy done 01-06-16. She states she is still having some left upper abdomina pain a "squeezing" sensation lasting 5 minutes worse in the morning time and late at night. The vomiting is random and not as consistent as it was before. She can eat toast, soups and some vegetables without vomiting. She does notice that she vomits more with meats.  I personally reviewed the patient's history.  HPI  Past Medical History  Diagnosis Date  . Fibroid uterus   . Anemia   . Polyp of stomach 01-06-16    Stomach polyp, pyloric    Past Surgical History  Procedure Laterality Date  . Uterine fibroid surgery  2012  . Appendectomy  2012  . Esophagogastroduodenoscopy (egd) with propofol N/A 01/06/2016    Procedure: ESOPHAGOGASTRODUODENOSCOPY (EGD) WITH PROPOFOL;  Surgeon: Robert Bellow, MD;  Location: [facility] ENDOSCOPY;  Service: Endoscopy;  Laterality: N/A;    Family History  Problem Relation Age of Onset  . Hypertension Mother   . Hypertension Father     Social History Social History  Substance Use Topics  . Smoking status: Never Smoker   . Smokeless tobacco: Never Used  . Alcohol Use: No    Allergies  Allergen Reactions  . Aspirin Nausea And Vomiting  . Oxycodone-Acetaminophen Rash and Shortness Of Breath  . Tape Other (See Comments)    Burning feeling. Has left mark on skin.  . Norethindrone Rash    Current Outpatient Prescriptions  Medication Sig Dispense Refill  . docusate sodium (COLACE) 100 MG capsule Take 1 capsule (100 mg total) by mouth daily. 90 capsule 3  . ferrous sulfate 325 (65 FE) MG tablet Take 1 tablet (325 mg total) by mouth daily with breakfast. 90 tablet 3  . linaclotide (LINZESS) 145 MCG CAPS capsule Take 1 capsule (145 mcg total) by  mouth daily before breakfast. (Patient taking differently: Take 145 mcg by mouth as needed. ) 30 capsule 2  . omeprazole (PRILOSEC) 40 MG capsule Take 1 capsule (40 mg total) by mouth daily. 30 capsule 0  . Vitamin D, Ergocalciferol, (DRISDOL) 50000 units CAPS capsule Take 1 capsule (50,000 Units total) by mouth every 7 (seven) days. 12 capsule 0  . dicyclomine (BENTYL) 20 MG tablet Take 1 tablet (20 mg total) by mouth 4 (four) times daily -  before meals and at bedtime. 60 tablet 0   No current facility-administered medications for this visit.    Review of Systems Review of Systems  Constitutional: Negative.   Respiratory: Negative.   Cardiovascular: Negative.     Blood pressure 128/80, pulse 60, resp. rate 12, height 5\' 4"  (1.626 m), weight 213 lb (96.616 kg), last menstrual period 12/31/2015.  Physical Exam Physical Exam  Constitutional: She is oriented to person, place, and time. She appears well-developed and well-nourished.  HENT:  Mouth/Throat: Oropharynx is clear and moist.  Eyes: Conjunctivae are normal. No scleral icterus.  Cardiovascular: Normal rate, regular rhythm and normal heart sounds.   Pulmonary/Chest: Effort normal and breath sounds normal.  Abdominal: Soft. Bowel sounds are normal. There is no tenderness.  Neurological: She is alert and oriented to person, place, and time.  Skin: Skin is warm and dry.  Psychiatric: Her behavior is normal.  Data Reviewed DIAGNOSIS:  A. STOMACH POLYP, PYLORIC; HOT SNARE:  - MILDLY INFLAMED POLYPOID ANTRAL MUCOSA WITH FEATURES SUGGESTIVE OF  PROLAPSE.  - PROMINENT THERMAL ARTIFACT LIMITS MICROSCOPIC EVALUATION.   B. STOMACH POLYP, LESSER CURVATURE; HOT SNARE:  - MILDLY INFLAMED POLYPOID ANTRAL MUCOSA WITH PROLAPSE CHANGE,  CONSISTENT WITH PROLAPSE-TYPE POLYP.  - NEGATIVE FOR DYSPLASIA AND MALIGNANCY.   Assessment    Gastric polyps, resected. Endoscopic and pathologic suggestion of prolapse.    Plan    Prior to  considering elective cholecystectomy for a low ejection fraction on HIDA scan, I recommended the patient make use of a trial of Bentyl, 20 mg 30 minutes before meals/daily at bedtime. She will give [name] an update in 2 weeks with her response.    Recommend trial of bentyl, call with status update in 2 weeks.  PCP:  Steele Sizer This information has been scribed by Karie Fetch RN, BSN,BC.    Robert Bellow 01/14/2016, 11:51 AM

## 2016-01-13 NOTE — Patient Instructions (Addendum)
The patient is aware to call back for any questions or concerns. Recommend trial of bentyl, call with status update in 2 weeks.

## 2016-01-14 ENCOUNTER — Other Ambulatory Visit: Payer: Self-pay | Admitting: Family Medicine

## 2016-01-14 DIAGNOSIS — K5909 Other constipation: Secondary | ICD-10-CM

## 2016-01-14 DIAGNOSIS — K317 Polyp of stomach and duodenum: Secondary | ICD-10-CM

## 2016-01-14 DIAGNOSIS — K828 Other specified diseases of gallbladder: Secondary | ICD-10-CM

## 2016-01-14 DIAGNOSIS — R1013 Epigastric pain: Secondary | ICD-10-CM

## 2016-01-14 DIAGNOSIS — R112 Nausea with vomiting, unspecified: Secondary | ICD-10-CM

## 2016-01-15 ENCOUNTER — Other Ambulatory Visit: Payer: Self-pay

## 2016-01-15 DIAGNOSIS — B373 Candidiasis of vulva and vagina: Secondary | ICD-10-CM

## 2016-01-15 DIAGNOSIS — B3731 Acute candidiasis of vulva and vagina: Secondary | ICD-10-CM

## 2016-01-15 NOTE — Telephone Encounter (Signed)
Patient stated that she tried a new body wash that made her itch and she now has some white discharge.

## 2016-01-17 MED ORDER — FLUCONAZOLE 150 MG PO TABS: 150.0000 mg | ORAL_TABLET | Freq: Once | ORAL | Status: DC

## 2016-01-21 ENCOUNTER — Ambulatory Visit: Payer: 59 | Admitting: Gastroenterology

## 2016-01-21 ENCOUNTER — Ambulatory Visit (INDEPENDENT_AMBULATORY_CARE_PROVIDER_SITE_OTHER): Payer: 59 | Admitting: Gastroenterology

## 2016-01-21 ENCOUNTER — Encounter: Payer: Self-pay | Admitting: Gastroenterology

## 2016-01-21 ENCOUNTER — Other Ambulatory Visit: Payer: Self-pay | Admitting: Family Medicine

## 2016-01-21 VITALS — BP 128/81 | HR 76 | Temp 98.9°F | Ht 61.5 in | Wt 214.0 lb

## 2016-01-21 DIAGNOSIS — R1013 Epigastric pain: Secondary | ICD-10-CM | POA: Diagnosis not present

## 2016-01-21 DIAGNOSIS — R112 Nausea with vomiting, unspecified: Secondary | ICD-10-CM

## 2016-01-21 NOTE — Progress Notes (Signed)
Gastroenterology Consultation  Referring Provider:     Steele Sizer, MD Primary Care Physician:  Loistine Chance, MD Primary Gastroenterologist:  Dr. Allen Norris     Reason for Consultation:     Abdominal pain and nausea        HPI:   Sharon Herring is a 40 y.o. y/o female referred for consultation & management of Abdominal pain and nausea by Dr. Loistine Chance, MD.  This patient comes in today with a history of 1 month of nausea with abdominal pain. The patient was seen by a surgeon who ordered a gallbladder emptying study and an upper endoscopy. The patient had 2 lesions removed from the stomach with the tissue showing it to be prolapsed normal mucosa. The patient now reports that she has had intermittent dark stools. Her pain is in the epigastric area and can be worse with eating but she reports that she has a lot of nausea. The patient had the gallbladder emptying study which showed her gallbladder to be working it only 3% of normal. The injection of CCK reproduced the nausea she was reporting. When she discussed this with the surgeon he told her that she did not need her gallbladder out. The patient also suffers from irritable bowel syndrome with constipation predominance for which she takes Linzess 145 g. She states she only takes this twice a week and this makes her regular.  Past Medical History  Diagnosis Date  . Fibroid uterus   . Anemia   . Polyp of stomach 01-06-16    Stomach polyp, pyloric    Past Surgical History  Procedure Laterality Date  . Uterine fibroid surgery  2012  . Appendectomy  2012  . Esophagogastroduodenoscopy (egd) with propofol N/A 01/06/2016    Procedure: ESOPHAGOGASTRODUODENOSCOPY (EGD) WITH PROPOFOL;  Surgeon: Robert Bellow, MD;  Location: [facility] ENDOSCOPY;  Service: Endoscopy;  Laterality: N/A;    Prior to Admission medications   Medication Sig Start Date End Date Taking? Authorizing Provider  dicyclomine (BENTYL) 20 MG tablet Take 1 tablet (20 mg  total) by mouth 4 (four) times daily -  before meals and at bedtime. 01/13/16   Robert Bellow, MD  docusate sodium (COLACE) 100 MG capsule Take 1 capsule (100 mg total) by mouth daily. 08/11/15   Bobetta Lime, MD  ferrous sulfate 325 (65 FE) MG tablet Take 1 tablet (325 mg total) by mouth daily with breakfast. 08/11/15   Bobetta Lime, MD  fluconazole (DIFLUCAN) 150 MG tablet Take 1 tablet (150 mg total) by mouth once. 01/17/16   Steele Sizer, MD  linaclotide (LINZESS) 145 MCG CAPS capsule Take 1 capsule (145 mcg total) by mouth daily before breakfast. Patient taking differently: Take 145 mcg by mouth as needed.  12/16/15   Steele Sizer, MD  omeprazole (PRILOSEC) 40 MG capsule Take 1 capsule (40 mg total) by mouth daily. 12/16/15   Steele Sizer, MD  Vitamin D, Ergocalciferol, (DRISDOL) 50000 units CAPS capsule Take 1 capsule (50,000 Units total) by mouth every 7 (seven) days. 12/04/15   Steele Sizer, MD    Family History  Problem Relation Age of Onset  . Hypertension Mother   . Hypertension Father      Social History  Substance Use Topics  . Smoking status: Never Smoker   . Smokeless tobacco: Never Used  . Alcohol Use: No    Allergies as of 01/21/2016 - Review Complete 01/21/2016  Allergen Reaction Noted  . Aspirin Nausea And Vomiting 05/12/2015  .  Oxycodone-acetaminophen Rash and Shortness Of Breath 05/12/2015  . Tape Other (See Comments) 05/12/2015  . Norethindrone Rash 05/12/2015    Review of Systems:    All systems reviewed and negative except where noted in HPI.   Physical Exam:  BP 128/81 mmHg  Pulse 76  Temp(Src) 98.9 F (37.2 C) (Oral)  Ht 5' 1.5" (1.562 m)  Wt 214 lb (97.07 kg)  BMI 39.79 kg/m2  LMP 12/31/2015 Patient's last menstrual period was 12/31/2015. Psych:  Alert and cooperative. Normal mood and affect. General:   Alert,  Well-developed, well-nourished, pleasant and cooperative in NAD Head:  Normocephalic and atraumatic. Eyes:  Sclera clear,  no icterus.   Conjunctiva pink. Ears:  Normal auditory acuity. Nose:  No deformity, discharge, or lesions. Mouth:  No deformity or lesions,oropharynx pink & moist. Neck:  Supple; no masses or thyromegaly. Lungs:  Respirations even and unlabored.  Clear throughout to auscultation.   No wheezes, crackles, or rhonchi. No acute distress. Heart:  Regular rate and rhythm; no murmurs, clicks, rubs, or gallops. Abdomen:  Normal bowel sounds.  No bruits.  Soft, non-tender and non-distended without masses, hepatosplenomegaly or hernias noted.  No guarding or rebound tenderness.  Positive Carnett sign with reproduction of the pain while raising the leg 6 inches above the exam table and palpating the abdominal wall with 1 finger.   Rectal:  Deferred.  Msk:  Symmetrical without gross deformities.  Good, equal movement & strength bilaterally. Pulses:  Normal pulses noted. Extremities:  No clubbing or edema.  No cyanosis. Neurologic:  Alert and oriented x3;  grossly normal neurologically. Skin:  Intact without significant lesions or rashes.  No jaundice. Lymph Nodes:  No significant cervical adenopathy. Psych:  Alert and cooperative. Normal mood and affect.  Imaging Studies: Nm Hepato W/eject Fract  12/23/2015  CLINICAL DATA:  Mid abdominal pain and nausea. EXAM: NUCLEAR MEDICINE HEPATOBILIARY IMAGING WITH GALLBLADDER EF TECHNIQUE: Sequential images of the abdomen were obtained out to 60 minutes following intravenous administration of radiopharmaceutical. After slow intravenous infusion of 1.9 micrograms Cholecystokinin, gallbladder ejection fraction was determined. RADIOPHARMACEUTICALS:  5.366 mCi Tc-9m Choletec IV COMPARISON:  Ultrasound dated 12/22/2015 FINDINGS: Prompt uptake and biliary excretion of activity by the liver is seen. Gallbladder activity is visualized, consistent with patency of cystic duct. Biliary activity passes into small bowel, consistent with patent common bile duct. Calculated  gallbladder ejection fraction is 3%. (At 60 min, normal ejection fraction is greater than 40%.) the patient did report some nausea during CCK administration. IMPRESSION: The hepatobiliary scan is normal except for a very minimal ejection fraction of only 3%. This is consistent with biliary dyskinesia. Electronically Signed   By: Lorriane Shire M.D.   On: 12/23/2015 13:41   Dg Duanne Limerick W/kub  01/01/2016  CLINICAL DATA:  Nausea and vomiting 2 2 for 2 months, epigastric pain; symptoms have increased over the past 2 weeks ; the patient does report symptoms of gastric retention with vomiting of gastric contents today that were ingested yesterday. EXAM: UPPER GI SERIES WITHOUT KUB TECHNIQUE: Routine upper GI series was performed with thin/high density/water soluble barium. Effervescent crystals and a barium tablet were administered. FLUOROSCOPY TIME:  Fluoroscopy Time (in minutes and seconds): 1 minutes, 30 seconds Number of Acquired Images:  15 COMPARISON:  None. FINDINGS: The patient ingested the thick and thin barium and the gas-forming crystals without significant difficulty. However, during the course of the procedure the patient did have nausea with he being at which time barium moved retrograde from  the stomach into to the level of the thoracic inlet. There was small reducible hiatal hernia. No stricture of the esophagus was observed. The stomach distended well. The mucosal pattern appeared normal. The mucosal folds were soft to palpation. The pyloric channel never relaxed significantly though a small amount of barium did enter the duodenum. The barium tablet passed without difficulty from out to the stomach. IMPRESSION: 1. Small reducible hiatal hernia without evidence of stricture nor esophagitis. 2. Normally distensible stomach with normal mucosal pattern. Gastric emptying however appeared delayed with only a small amount exiting into the duodenum. This could reflect edema of the pyloric channel due to an occult  ulcer or other pathology. Consideration for performing a nuclear medicine gastric emptying study is recommended. 3. The visualized portions of the duodenum were grossly normal but adequate distention of the duodenum was not obtained. 4. Upper endoscopy will be will be indicated if the patient's symptoms persist and remain unexplained. Electronically Signed   By: Sharon Herring M.D.   On: 01/01/2016 11:09    Assessment and Plan:   BULEAH EFFRON is a 39 y.o. y/o female comes in today with nausea after eating and reproducible with CCK during a gallbladder emptying study. Of note the gallbladder ejection fraction was 3%. I believe that this patient should be evaluated by a surgeon for her gallbladder to be removed as the cause of her nausea. The patient's abdominal pain is musculoskeletal in nature and is ex-been essentially increased with a straight leg test with and without palpating the abdominal wall with 1 finger. The patient has been told that I usually treat this with anti-inflammatory medications but due to the recent removal of tissue from the stomach and her report that she is having some dark stools I would hold off on any anti-inflammatories at this time. The patient should get a CBC tomorrow morning at her office and use warm compresses to the abdominal wall. She has also been told to avoid doing things that exacerbate the pain. She will also be set up to see a surgeon for her gallbladder dyskinesis and reproducible nausea with injection of CCK. If the patient's hemoglobin is down or she continues to have dark stools she may need a repeat upper endoscopy for possible GI bleed. The patient has been explained the plan and agrees with it.   Note: This dictation was prepared with Dragon dictation along with smaller phrase technology. Any transcriptional errors that result from this process are unintentional.

## 2016-01-22 ENCOUNTER — Other Ambulatory Visit
Admission: RE | Admit: 2016-01-22 | Discharge: 2016-01-22 | Disposition: A | Discharge status: Home / Self Care | Payer: 59 | Source: Ambulatory Visit | Attending: Family Medicine | Admitting: Family Medicine

## 2016-01-22 DIAGNOSIS — R112 Nausea with vomiting, unspecified: Secondary | ICD-10-CM | POA: Diagnosis not present

## 2016-01-22 DIAGNOSIS — R1013 Epigastric pain: Secondary | ICD-10-CM | POA: Diagnosis not present

## 2016-01-22 LAB — CBC WITH DIFFERENTIAL/PLATELET
BASOS ABS: 0 10*3/uL (ref 0–0.1)
Basophils Relative: 0 %
EOS ABS: 0 10*3/uL (ref 0–0.7)
EOS PCT: 1 %
HCT: 31 % — ABNORMAL LOW (ref 35.0–47.0)
HEMOGLOBIN: 10.2 g/dL — AB (ref 12.0–16.0)
LYMPHS ABS: 2 10*3/uL (ref 1.0–3.6)
Lymphocytes Relative: 33 %
MCH: 24.7 pg — ABNORMAL LOW (ref 26.0–34.0)
MCHC: 32.7 g/dL (ref 32.0–36.0)
MCV: 75.5 fL — ABNORMAL LOW (ref 80.0–100.0)
Monocytes Absolute: 0.6 10*3/uL (ref 0.2–0.9)
Monocytes Relative: 9 %
NEUTROS PCT: 57 %
Neutro Abs: 3.4 10*3/uL (ref 1.4–6.5)
PLATELETS: 458 10*3/uL — AB (ref 150–440)
RBC: 4.11 MIL/uL (ref 3.80–5.20)
RDW: 15.7 % — ABNORMAL HIGH (ref 11.5–14.5)
WBC: 6.1 10*3/uL (ref 3.6–11.0)

## 2016-01-26 ENCOUNTER — Encounter: Payer: Self-pay | Admitting: Gastroenterology

## 2016-02-03 ENCOUNTER — Encounter: Payer: Self-pay | Admitting: General Surgery

## 2016-02-03 ENCOUNTER — Ambulatory Visit (INDEPENDENT_AMBULATORY_CARE_PROVIDER_SITE_OTHER): Payer: 59 | Admitting: General Surgery

## 2016-02-03 VITALS — BP 132/84 | HR 75 | Temp 97.3°F | Ht 61.0 in | Wt 215.0 lb

## 2016-02-03 DIAGNOSIS — K828 Other specified diseases of gallbladder: Secondary | ICD-10-CM

## 2016-02-03 NOTE — Progress Notes (Signed)
Patient ID: Sharon Herring, female   DOB: 04/01/1976, 40 y.o.   MRN: [id number]  CC: Abdominal pain  HPI Sharon Herring is a 40 y.o. female presents to clinic for evaluation of upper abdominal pain. Patient states the last many months she's had intermittent upper abdominal pain. Pain is worsened after eating and sometimes followed by nausea and vomiting. She's had a prolonged workup performed by Dr. Bary Castilla and Dr. Allen Norris and has been found to have a decreased ejection fraction biliary dyskinesia as well as gastric polyps. She is here today for a second opinion about possible gallbladder surgery. She states although she's had some nausea and vomiting with abdominal pain she's not had any recently. She denies any fevers, chills, chest pain, short of breath, change in dietary habits. She does have irritable bowel syndrome and is a chronic constipated her. She takes linzess for her chronic constipation.  HPI  Past Medical History  Diagnosis Date  . Fibroid uterus   . Anemia   . Polyp of stomach 01-06-16    Stomach polyp, pyloric    Past Surgical History  Procedure Laterality Date  . Uterine fibroid surgery  2012  . Appendectomy  2012  . Esophagogastroduodenoscopy (egd) with propofol N/A 01/06/2016    Procedure: ESOPHAGOGASTRODUODENOSCOPY (EGD) WITH PROPOFOL;  Surgeon: Robert Bellow, MD;  Location: [facility] ENDOSCOPY;  Service: Endoscopy;  Laterality: N/A;    Family History  Problem Relation Age of Onset  . Hypertension Mother   . Hypertension Father     Social History Social History  Substance Use Topics  . Smoking status: Never Smoker   . Smokeless tobacco: Never Used  . Alcohol Use: No    Allergies  Allergen Reactions  . Aspirin Nausea And Vomiting  . Oxycodone-Acetaminophen Rash and Shortness Of Breath  . Tape Other (See Comments)    Burning feeling. Has left mark on skin.  . Norethindrone Rash    Current Outpatient Prescriptions  Medication Sig Dispense Refill  .  dicyclomine (BENTYL) 20 MG tablet Take 1 tablet (20 mg total) by mouth 4 (four) times daily -  before meals and at bedtime. 60 tablet 0  . docusate sodium (COLACE) 100 MG capsule Take 1 capsule (100 mg total) by mouth daily. 90 capsule 3  . ferrous sulfate 325 (65 FE) MG tablet Take 1 tablet (325 mg total) by mouth daily with breakfast. 90 tablet 3  . fluconazole (DIFLUCAN) 150 MG tablet Take 1 tablet (150 mg total) by mouth once. 2 tablet 0  . linaclotide (LINZESS) 145 MCG CAPS capsule Take 1 capsule (145 mcg total) by mouth daily before breakfast. (Patient taking differently: Take 145 mcg by mouth as needed. ) 30 capsule 2  . omeprazole (PRILOSEC) 40 MG capsule Take 1 capsule (40 mg total) by mouth daily. 30 capsule 0  . Vitamin D, Ergocalciferol, (DRISDOL) 50000 units CAPS capsule Take 1 capsule (50,000 Units total) by mouth every 7 (seven) days. 12 capsule 0   No current facility-administered medications for this visit.     Review of Systems A Multiple point review of systems was asked and was negative except for the findings documented in the history of present illness  Physical Exam Blood pressure 132/84, pulse 75, temperature 97.3 F (36.3 C), temperature source Oral, height 5\' 1"  (1.549 m), weight 97.523 kg (215 lb), last menstrual period 01/27/2016. CONSTITUTIONAL: No acute distress. EYES: Pupils are equal, round, and reactive to light, Sclera are non-icteric. EARS, NOSE, MOUTH AND THROAT: The oropharynx  is clear. The oral mucosa is pink and moist. Hearing is intact to voice. LYMPH NODES:  Lymph nodes in the neck are normal. RESPIRATORY:  Lungs are clear. There is normal respiratory effort, with equal breath sounds bilaterally, and without pathologic use of accessory muscles. CARDIOVASCULAR: Heart is regular without murmurs, gallops, or rubs. GI: The abdomen is soft, nontender, and nondistended. There are no palpable masses. There is no hepatosplenomegaly. There are normal bowel  sounds in all quadrants. Well-healed laparoscopic incision sites. GU: Rectal deferred.   MUSCULOSKELETAL: Normal muscle strength and tone. No cyanosis or edema.   SKIN: Turgor is good and there are no pathologic skin lesions or ulcers. NEUROLOGIC: Motor and sensation is grossly normal. Cranial nerves are grossly intact. PSYCH:  Oriented to person, place and time. Affect is normal.  Data Reviewed Images and labs reviewed. HIDA scan performed with a 3% ejection fraction. Labs are all within normal limits. Upper endoscopy does show some small benign polyp. Upper GI shows a small hiatal hernia. I have personally reviewed the patient's imaging, laboratory findings and medical records.    Assessment    Upper abdominal pain, biliary dyskinesia    Plan    A long conversation about the atypical nature of the patient's pain as well as the possible source of the pain including biliary dyskinesia, hiatal hernia, irritable bowel syndrome. Discussed that removing her gallbladder may or may not improve her symptoms given there are atypical nature. However, discussed the procedure of a laparoscopic cholecystectomy including a robotic-assisted cholecystectomy in detail.I discussed the procedure in detail.  The patient was given Neurosurgeon.  We discussed the risks and benefits of a laparoscopic cholecystectomy and possible cholangiogram including, but not limited to bleeding, infection, injury to surrounding structures such as the intestine or liver, bile leak, retained gallstones, need to convert to an open procedure, prolonged diarrhea, blood clots such as  DVT, common bile duct injury, anesthesia risks, and possible need for additional procedures.  The likelihood of improvement in symptoms and return to the patient's normal status is good. We discussed the typical post-operative recovery course. Patient voiced understanding and desire to proceed. We will plan for a robotic-assisted laparoscopic  cholecystectomy for July 13.     Time spent with the patient was 60 minutes, with more than 50% of the time spent in face-to-face education, counseling and care coordination.     Clayburn Pert, MD FACS General Surgeon 02/03/2016, 3:17 PM

## 2016-02-03 NOTE — Patient Instructions (Addendum)
You have requested to have your Gallbladder removed. We will arrange this to be done on 03/03/16 at [place] will be off from work for approximately 1-2 weeks depending on your recovery.   Please avoid greasy and fried foods if at all possible prior to your scheduled surgery to decrease symptoms until then.  Please see the [facility]) pre-care form you have been given today.  If you have any questions or concerns please call our office.      We will also repair your Hiatal Hernia.   You have requested for your Hiatal Hernia to be repaired. We will arrange this to be done on 03/03/16 by Dr. Adonis Huguenin at [facility].  Please see your (blue)pre-care sheet for information.  You will need to arrange to be off work for 1-2 weeks but will have to have a lifting restriction of no more than 15 lbs. For 6 weeks following your surgery.

## 2016-02-04 ENCOUNTER — Telehealth: Payer: Self-pay

## 2016-02-04 ENCOUNTER — Telehealth: Payer: Self-pay | Admitting: General Surgery

## 2016-02-04 NOTE — Telephone Encounter (Signed)
Pt advised of pre op date/time and sx date. Sx: 03/03/16 with Dr Eddie Dibbles assisted laparoscopic cholecystectomy.  Pre op: 02/25/16 between 9-1:00pm--Phone.   Patient made aware to call [phone], between 1-3:00pm the day before surgery, to find out what time to arrive.

## 2016-02-04 NOTE — Telephone Encounter (Signed)
Patient was scheduled for office visit.

## 2016-02-05 ENCOUNTER — Ambulatory Visit: Payer: Self-pay | Admitting: Family Medicine

## 2016-02-19 ENCOUNTER — Other Ambulatory Visit: Payer: Self-pay

## 2016-02-19 DIAGNOSIS — R1013 Epigastric pain: Secondary | ICD-10-CM

## 2016-02-19 MED ORDER — OMEPRAZOLE 40 MG PO CPDR: 40.0000 mg | DELAYED_RELEASE_CAPSULE | Freq: Every day | ORAL | Status: DC

## 2016-02-19 NOTE — Telephone Encounter (Signed)
Refill request was sent to Dr. Krichna Sowles for approval and submission.  

## 2016-02-25 ENCOUNTER — Encounter: Payer: Self-pay | Admitting: *Deleted

## 2016-02-25 ENCOUNTER — Other Ambulatory Visit: Payer: 59

## 2016-02-25 NOTE — Patient Instructions (Signed)
  Your procedure is scheduled on: 03-03-16 Report to Same Day Surgery 2nd floor medical mall To find out your arrival time please call [phone] between 1PM - 3PM on  03-02-16  Remember: Instructions that are not followed completely may result in serious medical risk, up to and including death, or upon the discretion of your surgeon and anesthesiologist your surgery may need to be rescheduled.    _x___ 1. Do not eat food or drink liquids after midnight. No gum chewing or hard candies.     __x__ 2. No Alcohol for 24 hours before or after surgery.   __x__3. No Smoking for 24 prior to surgery.   ____  4. Bring all medications with you on the day of surgery if instructed.    __x__ 5. Notify your doctor if there is any change in your medical condition     (cold, fever, infections).     Do not wear jewelry, make-up, hairpins, clips or nail polish.  Do not wear lotions, powders, or perfumes. You may wear deodorant.  Do not shave 48 hours prior to surgery. Men may shave face and neck.  Do not bring valuables to the hospital.    [facility] is not responsible for any belongings or valuables.               Contacts, dentures or bridgework may not be worn into surgery.  Leave your suitcase in the car. After surgery it may be brought to your room.  For patients admitted to the hospital, discharge time is determined by your treatment team.   Patients discharged the day of surgery will not be allowed to drive home.    Please read over the following fact sheets that you were given:   [facility] Preparing for Surgery and or MRSA Information   _x___ Take these medicines the morning of surgery with A SIP OF WATER:    1. PRILOSEC  2. TAKE A PRILOSEC ON Wednesday NIGHT BEFORE BED  3.  4.  5.  6.  ____ Fleet Enema (as directed)   ____ Use CHG Soap or sage wipes as directed on instruction sheet   ____ Use inhalers on the day of surgery and bring to hospital day of surgery  ____ Stop  metformin 2 days prior to surgery    ____ Take 1/2 of usual insulin dose the night before surgery and none on the morning of surgery.   ____ Stop aspirin or coumadin, or plavix  _x__ Stop Anti-inflammatories such as Advil, Aleve, Ibuprofen, Motrin, Naproxen,          Naprosyn, Goodies powders or aspirin products. Ok to take Tylenol.   ____ Stop supplements until after surgery.    ____ Bring C-Pap to the hospital.

## 2016-03-02 ENCOUNTER — Other Ambulatory Visit: Payer: Self-pay | Admitting: Family Medicine

## 2016-03-02 ENCOUNTER — Other Ambulatory Visit: Payer: Self-pay

## 2016-03-02 MED ORDER — DIPHENHYDRAMINE HCL 50 MG PO TABS: 50.0000 mg | ORAL_TABLET | Freq: Every evening | ORAL | Status: DC | PRN

## 2016-03-02 MED ORDER — TRIAMCINOLONE ACETONIDE 0.1 % EX CREA: 1.0000 "application " | TOPICAL_CREAM | Freq: Two times a day (BID) | CUTANEOUS | Status: DC

## 2016-03-02 NOTE — Telephone Encounter (Signed)
This patient came in stated that she needed something for her itching.

## 2016-03-03 ENCOUNTER — Telehealth: Payer: Self-pay

## 2016-03-03 ENCOUNTER — Ambulatory Visit: Payer: 59 | Admitting: Certified Registered"

## 2016-03-03 ENCOUNTER — Encounter: Payer: Self-pay | Admitting: Emergency Medicine

## 2016-03-03 ENCOUNTER — Encounter
Admission: RE | Disposition: A | Discharge status: Home / Self Care | Payer: Self-pay | Source: Ambulatory Visit | Attending: General Surgery

## 2016-03-03 ENCOUNTER — Ambulatory Visit
Admission: RE | Admit: 2016-03-03 | Discharge: 2016-03-03 | Disposition: A | Discharge status: Home / Self Care | Payer: 59 | Source: Ambulatory Visit | Attending: General Surgery | Admitting: General Surgery

## 2016-03-03 DIAGNOSIS — Z9049 Acquired absence of other specified parts of digestive tract: Secondary | ICD-10-CM | POA: Insufficient documentation

## 2016-03-03 DIAGNOSIS — Z885 Allergy status to narcotic agent status: Secondary | ICD-10-CM | POA: Insufficient documentation

## 2016-03-03 DIAGNOSIS — K317 Polyp of stomach and duodenum: Secondary | ICD-10-CM | POA: Diagnosis not present

## 2016-03-03 DIAGNOSIS — K8050 Calculus of bile duct without cholangitis or cholecystitis without obstruction: Secondary | ICD-10-CM | POA: Diagnosis not present

## 2016-03-03 DIAGNOSIS — Z888 Allergy status to other drugs, medicaments and biological substances status: Secondary | ICD-10-CM | POA: Insufficient documentation

## 2016-03-03 DIAGNOSIS — Z9889 Other specified postprocedural states: Secondary | ICD-10-CM | POA: Insufficient documentation

## 2016-03-03 DIAGNOSIS — K828 Other specified diseases of gallbladder: Secondary | ICD-10-CM | POA: Diagnosis not present

## 2016-03-03 DIAGNOSIS — Z886 Allergy status to analgesic agent status: Secondary | ICD-10-CM | POA: Diagnosis not present

## 2016-03-03 DIAGNOSIS — K449 Diaphragmatic hernia without obstruction or gangrene: Secondary | ICD-10-CM | POA: Diagnosis not present

## 2016-03-03 DIAGNOSIS — K589 Irritable bowel syndrome without diarrhea: Secondary | ICD-10-CM | POA: Insufficient documentation

## 2016-03-03 DIAGNOSIS — Z8249 Family history of ischemic heart disease and other diseases of the circulatory system: Secondary | ICD-10-CM | POA: Insufficient documentation

## 2016-03-03 DIAGNOSIS — Z91048 Other nonmedicinal substance allergy status: Secondary | ICD-10-CM | POA: Diagnosis not present

## 2016-03-03 DIAGNOSIS — K811 Chronic cholecystitis: Secondary | ICD-10-CM | POA: Diagnosis not present

## 2016-03-03 DIAGNOSIS — Z79899 Other long term (current) drug therapy: Secondary | ICD-10-CM | POA: Diagnosis not present

## 2016-03-03 HISTORY — PX: ROBOTIC ASSISTED LAPAROSCOPIC CHOLECYSTECTOMY: SHX6521

## 2016-03-03 HISTORY — DX: Gastro-esophageal reflux disease without esophagitis: K21.9

## 2016-03-03 HISTORY — DX: Irritable bowel syndrome, unspecified: K58.9

## 2016-03-03 LAB — POCT PREGNANCY, URINE: Preg Test, Ur: NEGATIVE

## 2016-03-03 SURGERY — ROBOTIC ASSISTED LAPAROSCOPIC CHOLECYSTECTOMY
Anesthesia: General | Wound class: Clean Contaminated

## 2016-03-03 MED ORDER — CHLORHEXIDINE GLUCONATE 4 % EX LIQD: 60.0000 mL | Freq: Once | CUTANEOUS | Status: DC

## 2016-03-03 MED ORDER — INDOCYANINE GREEN 25 MG IV SOLR
INTRAVENOUS | Status: AC
  Filled 2016-03-03: qty 25

## 2016-03-03 MED ORDER — TRAMADOL HCL 50 MG PO TABS: 50.0000 mg | ORAL_TABLET | Freq: Four times a day (QID) | ORAL | Status: DC | PRN

## 2016-03-03 MED ORDER — FENTANYL CITRATE (PF) 100 MCG/2ML IJ SOLN
INTRAMUSCULAR | Status: DC | PRN
  Administered 2016-03-03 (×3): 50 ug via INTRAVENOUS
  Administered 2016-03-03: 100 ug via INTRAVENOUS

## 2016-03-03 MED ORDER — PROPOFOL 10 MG/ML IV BOLUS
INTRAVENOUS | Status: DC | PRN
  Administered 2016-03-03: 200 mg via INTRAVENOUS

## 2016-03-03 MED ORDER — BUPIVACAINE HCL 0.5 % IJ SOLN
INTRAMUSCULAR | Status: DC | PRN
  Administered 2016-03-03: 42 mL

## 2016-03-03 MED ORDER — TRAMADOL HCL 50 MG PO TABS: 50.0000 mg | ORAL_TABLET | Freq: Four times a day (QID) | ORAL | Status: DC

## 2016-03-03 MED ORDER — PROMETHAZINE HCL 25 MG/ML IJ SOLN
INTRAMUSCULAR | Status: AC
  Filled 2016-03-03: qty 1

## 2016-03-03 MED ORDER — SUCCINYLCHOLINE CHLORIDE 20 MG/ML IJ SOLN
INTRAMUSCULAR | Status: DC | PRN
  Administered 2016-03-03: 100 mg via INTRAVENOUS

## 2016-03-03 MED ORDER — MIDAZOLAM HCL 2 MG/2ML IJ SOLN
INTRAMUSCULAR | Status: DC | PRN
  Administered 2016-03-03: 2 mg via INTRAVENOUS

## 2016-03-03 MED ORDER — INDOCYANINE GREEN 25 MG IV SOLR
1.5000 mg | Freq: Once | INTRAVENOUS | Status: AC
  Administered 2016-03-03: 1.5 mg via INTRAVENOUS
  Filled 2016-03-03: qty 25

## 2016-03-03 MED ORDER — PHENYLEPHRINE HCL 10 MG/ML IJ SOLN
INTRAMUSCULAR | Status: DC | PRN
  Administered 2016-03-03 (×2): 100 ug via INTRAVENOUS

## 2016-03-03 MED ORDER — PROMETHAZINE HCL 25 MG/ML IJ SOLN
6.2500 mg | INTRAMUSCULAR | Status: DC | PRN
  Administered 2016-03-03: 6.25 mg via INTRAVENOUS

## 2016-03-03 MED ORDER — CEFOXITIN SODIUM 1 G IV SOLR
1.0000 g | INTRAVENOUS | Status: AC
  Administered 2016-03-03: 1 g via INTRAVENOUS
  Filled 2016-03-03: qty 1

## 2016-03-03 MED ORDER — SUGAMMADEX SODIUM 200 MG/2ML IV SOLN
INTRAVENOUS | Status: DC | PRN
  Administered 2016-03-03: 200 mg via INTRAVENOUS

## 2016-03-03 MED ORDER — FENTANYL CITRATE (PF) 100 MCG/2ML IJ SOLN
25.0000 ug | INTRAMUSCULAR | Status: DC | PRN
  Administered 2016-03-03 (×2): 50 ug via INTRAVENOUS

## 2016-03-03 MED ORDER — BUPIVACAINE HCL (PF) 0.5 % IJ SOLN
INTRAMUSCULAR | Status: AC
  Filled 2016-03-03: qty 30

## 2016-03-03 MED ORDER — LIDOCAINE HCL (CARDIAC) 20 MG/ML IV SOLN
INTRAVENOUS | Status: DC | PRN
  Administered 2016-03-03: 50 mg via INTRAVENOUS

## 2016-03-03 MED ORDER — ROCURONIUM BROMIDE 100 MG/10ML IV SOLN
INTRAVENOUS | Status: DC | PRN
  Administered 2016-03-03: 10 mg via INTRAVENOUS
  Administered 2016-03-03: 30 mg via INTRAVENOUS

## 2016-03-03 MED ORDER — ONDANSETRON HCL 4 MG/2ML IJ SOLN
INTRAMUSCULAR | Status: DC | PRN
  Administered 2016-03-03: 4 mg via INTRAVENOUS

## 2016-03-03 MED ORDER — LACTATED RINGERS IV SOLN
INTRAVENOUS | Status: DC
  Administered 2016-03-03: 07:00:00 via INTRAVENOUS

## 2016-03-03 MED ORDER — DEXAMETHASONE SODIUM PHOSPHATE 10 MG/ML IJ SOLN
INTRAMUSCULAR | Status: DC | PRN
  Administered 2016-03-03: 10 mg via INTRAVENOUS

## 2016-03-03 MED ORDER — SODIUM CHLORIDE 0.9 % IJ SOLN
INTRAMUSCULAR | Status: AC
  Filled 2016-03-03: qty 10

## 2016-03-03 MED ORDER — LIDOCAINE HCL (PF) 1 % IJ SOLN
INTRAMUSCULAR | Status: AC
  Filled 2016-03-03: qty 30

## 2016-03-03 MED ORDER — FENTANYL CITRATE (PF) 100 MCG/2ML IJ SOLN
INTRAMUSCULAR | Status: AC
  Filled 2016-03-03: qty 2

## 2016-03-03 SURGICAL SUPPLY — 41 items
ADHESIVE MASTISOL STRL (MISCELLANEOUS) ×2 IMPLANT
BLADE SURG SZ11 CARB STEEL (BLADE) ×2 IMPLANT
CANISTER SUCT 1200ML W/VALVE (MISCELLANEOUS) ×2 IMPLANT
CHLORAPREP W/TINT 26ML (MISCELLANEOUS) ×2 IMPLANT
CLIP LIGATING HEMO O LOK GREEN (MISCELLANEOUS) ×4 IMPLANT
CORD BIP STRL DISP 12FT (MISCELLANEOUS) ×2 IMPLANT
COVER TIP SHEARS 8 DVNC (MISCELLANEOUS) ×1 IMPLANT
COVER TIP SHEARS 8MM DA VINCI (MISCELLANEOUS) ×1
DEFOGGER SCOPE WARMER CLEARIFY (MISCELLANEOUS) ×2 IMPLANT
DRAPE 3 ARM ACCESS DA VINCI (DRAPES) ×1
DRAPE 3 ARM ACCESS DVNC (DRAPES) ×1 IMPLANT
DRESSING TELFA 4X3 1S ST N-ADH (GAUZE/BANDAGES/DRESSINGS) ×2 IMPLANT
DRSG TEGADERM 4X4.75 (GAUZE/BANDAGES/DRESSINGS) ×8 IMPLANT
ELECT REM PT RETURN 9FT ADLT (ELECTROSURGICAL) ×2
ELECTRODE REM PT RTRN 9FT ADLT (ELECTROSURGICAL) ×1 IMPLANT
GLOVE BIO SURGEON STRL SZ7.5 (GLOVE) ×12 IMPLANT
GLOVE INDICATOR 8.0 STRL GRN (GLOVE) ×4 IMPLANT
GOWN STRL REUS W/ TWL LRG LVL3 (GOWN DISPOSABLE) ×3 IMPLANT
GOWN STRL REUS W/TWL LRG LVL3 (GOWN DISPOSABLE) ×3
IRRIGATION STRYKERFLOW (MISCELLANEOUS) IMPLANT
IRRIGATOR STRYKERFLOW (MISCELLANEOUS)
IV NS 1000ML (IV SOLUTION)
IV NS 1000ML BAXH (IV SOLUTION) IMPLANT
KIT PINK PAD W/HEAD ARE REST (MISCELLANEOUS) ×2
KIT PINK PAD W/HEAD ARM REST (MISCELLANEOUS) ×1 IMPLANT
L-HOOK LAP DISP 36CM (ELECTROSURGICAL) ×2
LHOOK LAP DISP 36CM (ELECTROSURGICAL) ×1 IMPLANT
NDL SAFETY 22GX1.5 (NEEDLE) ×2 IMPLANT
NEEDLE VERESS 14GA 120MM (NEEDLE) ×2 IMPLANT
NS IRRIG 500ML POUR BTL (IV SOLUTION) ×2 IMPLANT
PACK LAP CHOLECYSTECTOMY (MISCELLANEOUS) ×2 IMPLANT
PENCIL ELECTRO HAND CTR (MISCELLANEOUS) ×2 IMPLANT
POUCH ENDO CATCH 10MM SPEC (MISCELLANEOUS) ×2 IMPLANT
SCISSORS METZENBAUM CVD 33 (INSTRUMENTS) ×2 IMPLANT
SOLUTION ELECTROLUBE (MISCELLANEOUS) ×2 IMPLANT
SUT MNCRL 4-0 (SUTURE) ×1
SUT MNCRL 4-0 27XMFL (SUTURE) ×1
SUTURE MNCRL 4-0 27XMF (SUTURE) ×1 IMPLANT
TROCAR XCEL 12X100 BLDLESS (ENDOMECHANICALS) ×2 IMPLANT
TROCAR XCEL NON-BLD 5MMX100MML (ENDOMECHANICALS) ×2 IMPLANT
TUBING INSUFFLATOR HI FLOW (MISCELLANEOUS) ×2 IMPLANT

## 2016-03-03 NOTE — Transfer of Care (Signed)
Immediate Anesthesia Transfer of Care Note  Patient: Sharon Herring  Procedure(s) Performed: Procedure(s): ROBOTIC ASSISTED LAPAROSCOPIC CHOLECYSTECTOMY  (N/A)  Patient Location: PACU  Anesthesia Type:General  Level of Consciousness: sedated  Airway & Oxygen Therapy: Patient Spontanous Breathing and Patient connected to face mask oxygen  Post-op Assessment: Report given to RN and Post -op Vital signs reviewed and stable  Post vital signs: Reviewed  Last Vitals:  Filed Vitals:   03/03/16 0653 03/03/16 0934  BP: 126/71 132/93  Pulse: 89 81  Temp: 36.2 C 36.6 C  Resp: 20 16    Last Pain:  Filed Vitals:   03/03/16 0934  PainSc: 6          Complications: No apparent anesthesia complications

## 2016-03-03 NOTE — Anesthesia Postprocedure Evaluation (Signed)
Anesthesia Post Note  Patient: [name]  Procedure(s) Performed: Procedure(s) (LRB): ROBOTIC ASSISTED LAPAROSCOPIC CHOLECYSTECTOMY  (N/A)  Patient location during evaluation: PACU Anesthesia Type: General Level of consciousness: awake and alert Pain management: pain level controlled Vital Signs Assessment: post-procedure vital signs reviewed and stable Respiratory status: spontaneous breathing, nonlabored ventilation, respiratory function stable and patient connected to nasal cannula oxygen Cardiovascular status: blood pressure returned to baseline and stable Postop Assessment: no signs of nausea or vomiting Anesthetic complications: no    Last Vitals:  Filed Vitals:   03/03/16 1124 03/03/16 1155  BP: 123/76 135/82  Pulse: 88 89  Temp:    Resp: 14 16    Last Pain:  Filed Vitals:   03/03/16 1159  PainSc: 3                  Martha Clan

## 2016-03-03 NOTE — Telephone Encounter (Signed)
Disability Form was filled out and faxed to [employer] [phone].

## 2016-03-03 NOTE — Brief Op Note (Signed)
03/03/2016  9:30 AM  PATIENT:  [name]  40 y.o. female  PRE-OPERATIVE DIAGNOSIS:  BILIARY DYSKINESIA  POST-OPERATIVE DIAGNOSIS:  BILIARY DYSKINESIA  PROCEDURE:  Procedure(s): ROBOTIC ASSISTED LAPAROSCOPIC CHOLECYSTECTOMY  (N/A)  SURGEON:  Surgeon(s) and Role:    * Clayburn Pert, MD - Primary  PHYSICIAN ASSISTANT:   ASSISTANTS: none   ANESTHESIA:   general  EBL:  Total I/O In: 300 [I.V.:300] Out: -   BLOOD ADMINISTERED:none  DRAINS: none   LOCAL MEDICATIONS USED:  MARCAINE   , XYLOCAINE  and Amount: 42 ml  SPECIMEN:  Source of Specimen:  gallbladder  DISPOSITION OF SPECIMEN:  PATHOLOGY  COUNTS:  YES  TOURNIQUET:  * No tourniquets in log *  DICTATION: .Dragon Dictation  PLAN OF CARE: Discharge to home after PACU  PATIENT DISPOSITION:  PACU - hemodynamically stable.   Delay start of Pharmacological VTE agent (>24hrs) due to surgical blood loss or risk of bleeding: no

## 2016-03-03 NOTE — Op Note (Signed)
Robotic Assisted Laparoscopic Cholecystectomy  Pre-operative Diagnosis: Biliary colic  Post-operative Diagnosis: Same  Procedure: Robotic assisted laparoscopic cholecystectomy  Surgeon: Juanda Crumble T. Adonis Huguenin, MD FACS  Anesthesia: Gen. with endotracheal tube  Assistant:None  Procedure Details  The patient was seen again in the Holding Room. The benefits, complications, treatment options, and expected outcomes were discussed with the patient. The risks of bleeding, infection, recurrence of symptoms, failure to resolve symptoms, bile duct damage, bile duct leak, retained common bile duct stone, bowel injury, any of which could require further surgery and/or ERCP, stent, or papillotomy were reviewed with the patient. The likelihood of improving the patient's symptoms with return to their baseline status is good.  The patient and/or family concurred with the proposed plan, giving informed consent.  The patient was taken to Operating Room, identified as Sharon Herring and the procedure verified as Laparoscopic Cholecystectomy.  A Time Out was held and the above information confirmed.  Prior to the induction of general anesthesia, antibiotic prophylaxis was administered. VTE prophylaxis was in place. General endotracheal anesthesia was then administered and tolerated well. After the induction, the abdomen was prepped with Chloraprep and draped in the sterile fashion. The patient was positioned in the supine position.  Local anesthetic was injected into the skin near the umbilicus and an incision made. Patient was noted to have a large umbilical hernia which was entered into sharply allowing visualization of the peritoneal cavity. A 0 Vicryl suture was placed in a figure-of-eight fashion and a 12 mm trocar was placed through this allowed a pneumoperitoneum to be established under direct position. There is no damage visualized upon entry into the abdomen. Patient was placed head up and rotated to the left.  The abdomen was marked intensely meter increments laterally from the umbilicus and robotic 8 mm trochars were placed just above the umbilicus in the midclavicular line approximately tense meters from the umbilicus. An additional 5 mm trochars placed in the left lower quadrant. All the sites were localized with local anesthetic and access the peritoneum was established under direct visualization without any difficulty. At this point the [employer] system was docked without any difficulty. The camera was able to be advanced in the abdomen with clear visualization of our trochars and are operative anatomy. The robotic hook was placed into the right arm and the grasper is placed in the left arm. I then broke scrub and proceeded to the console.  The gallbladder was identified, the fundus grasped and retracted cephalad. Adhesions were lysed bluntly. The infundibulum was grasped and retracted laterally, exposing the peritoneum overlying the triangle of Calot. This was then divided and exposed in a blunt fashion. Patient was noted to have an anterior cystic artery which was clearly dissected out and clipped with hemo-lock clips prior to being cut with a collector cautery. A critical view of the cystic duct and cystic artery was obtained.  The cystic duct was identified and bluntly dissected. Although the view of safety was obtained visualization of the duct appeared abnormal. Under firefly the biliary tree was clearly visualized showing a long cystic duct going to a common duct far away from our operative anatomy. At which point the cystic duct and the posterior cystic artery that had been dissected out were serially clipped with hemo-lock clips. There cut in between with robotic shears. The gallbladder was then dissected free from the liver with hook electrocautery without any difficulty or entry into the gallbladder or liver. Once the gallbladder was free from all its  attachments it was released from the robotic grasper  and placed into the right upper quadrant using the assistant port grasper.  I then scrubbed back in and return to the patient's bedside. The [employer] system was undocked from the trochars and retracted from the bedside. The camera was placed into the most right lateral trocar and using an Endo Catch bag the gallbladder was retrieved and removed through the umbilical trocar site.The gallbladder bed was again visualized and noted to be hemostatic with no evidence of bleeding or bile leak from any of our clips.  Inspection of the right upper quadrant was performed. No bleeding, bile duct injury or leak, or bowel injury was noted. Pneumoperitoneum was released.  The umbilical port site was closed with previously placed figure-of-eight 0 Vicryl sutures. 4-0 subcuticular Monocryl was used to close the skin. Steristrips and Mastisol and sterile dressings were  applied.  The patient was then extubated and brought to the recovery room in stable condition. Sponge, lap, and needle counts were correct at closure and at the conclusion of the case.   Findings: Normal appearing gallbladder  Estimated Blood Loss: 10 mL         Drains: None         Specimens: Gallbladder           Complications: none               Duc Crocket T. Adonis Huguenin, MD, FACS

## 2016-03-03 NOTE — Anesthesia Procedure Notes (Signed)
Procedure Name: Intubation Performed by: Rolla Plate Pre-anesthesia Checklist: Patient identified, Patient being monitored, Timeout performed, Emergency Drugs available and Suction available Patient Re-evaluated:Patient Re-evaluated prior to inductionOxygen Delivery Method: Circle system utilized Preoxygenation: Pre-oxygenation with 100% oxygen Intubation Type: IV induction and Rapid sequence Laryngoscope Size: Miller and 2 Grade View: Grade I Tube type: Oral Tube size: 7.0 mm Number of attempts: 1 Airway Equipment and Method: Stylet Placement Confirmation: ETT inserted through vocal cords under direct vision,  positive ETCO2 and breath sounds checked- equal and bilateral Secured at: 22 cm Tube secured with: Tape Dental Injury: Teeth and Oropharynx as per pre-operative assessment

## 2016-03-03 NOTE — Anesthesia Preprocedure Evaluation (Signed)
Anesthesia Evaluation  Patient identified by MRN, date of birth, ID band Patient awake    Reviewed: Allergy & Precautions, H&P , NPO status , Patient's Chart, lab work & pertinent test results, reviewed documented beta blocker date and time   Airway Mallampati: IV  TM Distance: >3 FB Neck ROM: full    Dental no notable dental hx. (+) Partial Upper, Teeth Intact   Pulmonary neg pulmonary ROS,    Pulmonary exam normal breath sounds clear to auscultation       Cardiovascular Exercise Tolerance: Good negative cardio ROS Normal cardiovascular exam Rhythm:regular Rate:Normal     Neuro/Psych negative neurological ROS  negative psych ROS   GI/Hepatic Neg liver ROS, GERD  ,  Endo/Other  negative endocrine ROS  Renal/GU negative Renal ROS  negative genitourinary   Musculoskeletal   Abdominal   Peds  Hematology  (+) Blood dyscrasia, anemia ,   Anesthesia Other Findings Past Medical History:   Fibroid uterus                                               Anemia                                                       Polyp of stomach                                01-06-16        Comment:Stomach polyp, pyloric   IBS (irritable bowel syndrome)                               GERD (gastroesophageal reflux disease)                       Reproductive/Obstetrics negative OB ROS                             Anesthesia Physical Anesthesia Plan  ASA: II  Anesthesia Plan: General   Post-op Pain Management:    Induction:   Airway Management Planned:   Additional Equipment:   Intra-op Plan:   Post-operative Plan:   Informed Consent: I have reviewed the patients History and Physical, chart, labs and discussed the procedure including the risks, benefits and alternatives for the proposed anesthesia with the patient or authorized representative who has indicated his/her understanding and acceptance.    Dental Advisory Given  Plan Discussed with: Anesthesiologist, CRNA and Surgeon  Anesthesia Plan Comments:         Anesthesia Quick Evaluation

## 2016-03-03 NOTE — Interval H&P Note (Signed)
History and Physical Interval Note:  03/03/2016 6:43 AM  Sharon Herring  has presented today for surgery, with the diagnosis of BILIARY DYSKINESIA  The various methods of treatment have been discussed with the patient and family. After consideration of risks, benefits and other options for treatment, the patient has consented to  Procedure(s): ROBOTIC ASSISTED LAPAROSCOPIC CHOLECYSTECTOMY(ROBOT) (N/A) as a surgical intervention .  The patient's history has been reviewed, patient examined, no change in status, stable for surgery.  I have reviewed the patient's chart and labs.  Questions were answered to the patient's satisfaction.     Clayburn Pert

## 2016-03-03 NOTE — Discharge Instructions (Signed)
Laparoscopic Cholecystectomy, Care After Refer to this sheet in the next few weeks. These instructions provide you with information about caring for yourself after your procedure. Your health care provider may also give you more specific instructions. Your treatment has been planned according to current medical practices, but problems sometimes occur. Call your health care provider if you have any problems or questions after your procedure. WHAT TO EXPECT AFTER THE PROCEDURE After your procedure, it is common to have:  Pain at your incision sites. You will be given pain medicines to control your pain.  Mild nausea or vomiting. This should improve after the first 24 hours.  Bloating and possible shoulder pain from the gas that was used during the procedure. This will improve after the first 24 hours. HOME CARE INSTRUCTIONS Incision Care  Follow instructions from your health care provider about how to take care of your incisions. Make sure you:  Wash your hands with soap and water before you change your bandage (dressing). If soap and water are not available, use hand sanitizer.  Change your dressing as told by your health care provider.  Leave stitches (sutures), skin glue, or adhesive strips in place. These skin closures may need to be in place for 2 weeks or longer. If adhesive strip edges start to loosen and curl up, you may trim the loose edges. Do not remove adhesive strips completely unless your health care provider tells you to do that.  Do not take baths, swim, or use a hot tub until your health care provider approves. Ask your health care provider if you can take showers. You may only be allowed to take sponge baths for bathing. General Instructions  Take over-the-counter and prescription medicines only as told by your health care provider.  Do not drive or operate heavy machinery while taking prescription pain medicine.  Return to your normal diet as told by your health care  provider.  Do not lift anything that is heavier than 10 lb (4.5 kg).  Do not play contact sports for one week or until your health care provider approves. SEEK MEDICAL CARE IF:   You have redness, swelling, or pain at the site of your incision.  You have fluid, blood, or pus coming from your incision.  You notice a bad smell coming from your incision area.  Your surgical incisions break open.  You have a fever. SEEK IMMEDIATE MEDICAL CARE IF:  You develop a rash.  You have difficulty breathing.  You have chest pain.  You have increasing pain in your shoulders (shoulder strap areas).  You faint or have dizzy episodes while you are standing.  You have severe pain in your abdomen.  You have nausea or vomiting that lasts for more than one day.   This information is not intended to replace advice given to you by your health care provider. Make sure you discuss any questions you have with your health care provider.   Document Released: 08/08/2005 Document Revised: 04/29/2015 Document Reviewed: 03/20/2013 Elsevier Interactive Patient Education 2016 [place] Anesthesia, Adult General anesthesia is a sleep-like state of non-feeling produced by medicines (anesthetics). General anesthesia prevents you from being alert and feeling pain during a medical procedure. Your caregiver may recommend general anesthesia if your procedure:  Is long.  Is painful or uncomfortable.  Would be frightening to see or hear.  Requires you to be still.  Affects your breathing.  Causes significant blood loss. LET YOUR CAREGIVER  KNOW ABOUT:  Allergies to food or medicine.  Medicines taken, including vitamins, herbs, eyedrops, over-the-counter medicines, and creams.  Use of steroids (by mouth or creams).  Previous problems with anesthetics or numbing medicines, including problems experienced by relatives.  History of bleeding problems or blood clots.  Previous  surgeries and types of anesthetics received.  Possibility of pregnancy, if this applies.  Use of cigarettes, alcohol, or illegal drugs.  Any health condition(s), especially diabetes, sleep apnea, and high blood pressure. RISKS AND COMPLICATIONS General anesthesia rarely causes complications. However, if complications do occur, they can be life threatening. Complications include:  A lung infection.  A stroke.  A heart attack.  Waking up during the procedure. When this occurs, the patient may be unable to move and communicate that he or she is awake. The patient may feel severe pain. Older adults and adults with serious medical problems are more likely to have complications than adults who are young and healthy. Some complications can be prevented by answering all of your caregiver's questions thoroughly and by following all pre-procedure instructions. It is important to tell your caregiver if any of the pre-procedure instructions, especially those related to diet, were not followed. Any food or liquid in the stomach can cause problems when you are under general anesthesia. BEFORE THE PROCEDURE  Ask your caregiver if you will have to spend the night at the hospital. If you will not have to spend the night, arrange to have an adult drive you and stay with you for 24 hours.  Follow your caregiver's instructions if you are taking dietary supplements or medicines. Your caregiver may tell you to stop taking them or to reduce your dosage.  Do not smoke for as long as possible before your procedure. If possible, stop smoking 3-6 weeks before the procedure.  Do not take new dietary supplements or medicines within 1 week of your procedure unless your caregiver approves them.  Do not eat within 8 hours of your procedure or as directed by your caregiver. Drink only clear liquids, such as water, black coffee (without milk or cream), and fruit juices (without pulp).  Do not drink within 3 hours of  your procedure or as directed by your caregiver.  You may brush your teeth on the morning of the procedure, but make sure to spit out the toothpaste and water when finished. PROCEDURE  You will receive anesthetics through a mask, through an intravenous (IV) access tube, or through both. A doctor who specializes in anesthesia (anesthesiologist) or a nurse who specializes in anesthesia (nurse anesthetist) or both will stay with you throughout the procedure to make sure you remain unconscious. He or she will also watch your blood pressure, pulse, and oxygen levels to make sure that the anesthetics do not cause any problems. Once you are asleep, a breathing tube or mask may be used to help you breathe. AFTER THE PROCEDURE You will wake up after the procedure is complete. You may be in the room where the procedure was performed or in a recovery area. You may have a sore throat if a breathing tube was used. You may also feel:  Dizzy.  Weak.  Drowsy.  Confused.  Nauseous.  Cold. These are all normal responses and can be expected to last for up to 24 hours after the procedure is complete. A caregiver will tell you when you are ready to go home. This will usually be when you are fully awake and in stable condition.   This  information is not intended to replace advice given to you by your health care provider. Make sure you discuss any questions you have with your health care provider.   Document Released: 11/15/2007 Document Revised: 08/29/2014 Document Reviewed: 12/07/2011 Elsevier Interactive Patient Education [employer].

## 2016-03-03 NOTE — H&P (View-Only) (Signed)
Patient ID: Sharon Herring, female   DOB: 04/01/1976, 40 y.o.   MRN: [id number]  CC: Abdominal pain  HPI Sharon Herring is a 40 y.o. female presents to clinic for evaluation of upper abdominal pain. Patient states the last many months she's had intermittent upper abdominal pain. Pain is worsened after eating and sometimes followed by nausea and vomiting. She's had a prolonged workup performed by Dr. Bary Castilla and Dr. Allen Norris and has been found to have a decreased ejection fraction biliary dyskinesia as well as gastric polyps. She is here today for a second opinion about possible gallbladder surgery. She states although she's had some nausea and vomiting with abdominal pain she's not had any recently. She denies any fevers, chills, chest pain, short of breath, change in dietary habits. She does have irritable bowel syndrome and is a chronic constipated her. She takes linzess for her chronic constipation.  HPI  Past Medical History  Diagnosis Date  . Fibroid uterus   . Anemia   . Polyp of stomach 01-06-16    Stomach polyp, pyloric    Past Surgical History  Procedure Laterality Date  . Uterine fibroid surgery  2012  . Appendectomy  2012  . Esophagogastroduodenoscopy (egd) with propofol N/A 01/06/2016    Procedure: ESOPHAGOGASTRODUODENOSCOPY (EGD) WITH PROPOFOL;  Surgeon: Robert Bellow, MD;  Location: [facility] ENDOSCOPY;  Service: Endoscopy;  Laterality: N/A;    Family History  Problem Relation Age of Onset  . Hypertension Mother   . Hypertension Father     Social History Social History  Substance Use Topics  . Smoking status: Never Smoker   . Smokeless tobacco: Never Used  . Alcohol Use: No    Allergies  Allergen Reactions  . Aspirin Nausea And Vomiting  . Oxycodone-Acetaminophen Rash and Shortness Of Breath  . Tape Other (See Comments)    Burning feeling. Has left mark on skin.  . Norethindrone Rash    Current Outpatient Prescriptions  Medication Sig Dispense Refill  .  dicyclomine (BENTYL) 20 MG tablet Take 1 tablet (20 mg total) by mouth 4 (four) times daily -  before meals and at bedtime. 60 tablet 0  . docusate sodium (COLACE) 100 MG capsule Take 1 capsule (100 mg total) by mouth daily. 90 capsule 3  . ferrous sulfate 325 (65 FE) MG tablet Take 1 tablet (325 mg total) by mouth daily with breakfast. 90 tablet 3  . fluconazole (DIFLUCAN) 150 MG tablet Take 1 tablet (150 mg total) by mouth once. 2 tablet 0  . linaclotide (LINZESS) 145 MCG CAPS capsule Take 1 capsule (145 mcg total) by mouth daily before breakfast. (Patient taking differently: Take 145 mcg by mouth as needed. ) 30 capsule 2  . omeprazole (PRILOSEC) 40 MG capsule Take 1 capsule (40 mg total) by mouth daily. 30 capsule 0  . Vitamin D, Ergocalciferol, (DRISDOL) 50000 units CAPS capsule Take 1 capsule (50,000 Units total) by mouth every 7 (seven) days. 12 capsule 0   No current facility-administered medications for this visit.     Review of Systems A Multiple point review of systems was asked and was negative except for the findings documented in the history of present illness  Physical Exam Blood pressure 132/84, pulse 75, temperature 97.3 F (36.3 C), temperature source Oral, height 5\' 1"  (1.549 m), weight 97.523 kg (215 lb), last menstrual period 01/27/2016. CONSTITUTIONAL: No acute distress. EYES: Pupils are equal, round, and reactive to light, Sclera are non-icteric. EARS, NOSE, MOUTH AND THROAT: The oropharynx  is clear. The oral mucosa is pink and moist. Hearing is intact to voice. LYMPH NODES:  Lymph nodes in the neck are normal. RESPIRATORY:  Lungs are clear. There is normal respiratory effort, with equal breath sounds bilaterally, and without pathologic use of accessory muscles. CARDIOVASCULAR: Heart is regular without murmurs, gallops, or rubs. GI: The abdomen is soft, nontender, and nondistended. There are no palpable masses. There is no hepatosplenomegaly. There are normal bowel  sounds in all quadrants. Well-healed laparoscopic incision sites. GU: Rectal deferred.   MUSCULOSKELETAL: Normal muscle strength and tone. No cyanosis or edema.   SKIN: Turgor is good and there are no pathologic skin lesions or ulcers. NEUROLOGIC: Motor and sensation is grossly normal. Cranial nerves are grossly intact. PSYCH:  Oriented to person, place and time. Affect is normal.  Data Reviewed Images and labs reviewed. HIDA scan performed with a 3% ejection fraction. Labs are all within normal limits. Upper endoscopy does show some small benign polyp. Upper GI shows a small hiatal hernia. I have personally reviewed the patient's imaging, laboratory findings and medical records.    Assessment    Upper abdominal pain, biliary dyskinesia    Plan    A long conversation about the atypical nature of the patient's pain as well as the possible source of the pain including biliary dyskinesia, hiatal hernia, irritable bowel syndrome. Discussed that removing her gallbladder may or may not improve her symptoms given there are atypical nature. However, discussed the procedure of a laparoscopic cholecystectomy including a robotic-assisted cholecystectomy in detail.I discussed the procedure in detail.  The patient was given Neurosurgeon.  We discussed the risks and benefits of a laparoscopic cholecystectomy and possible cholangiogram including, but not limited to bleeding, infection, injury to surrounding structures such as the intestine or liver, bile leak, retained gallstones, need to convert to an open procedure, prolonged diarrhea, blood clots such as  DVT, common bile duct injury, anesthesia risks, and possible need for additional procedures.  The likelihood of improvement in symptoms and return to the patient's normal status is good. We discussed the typical post-operative recovery course. Patient voiced understanding and desire to proceed. We will plan for a robotic-assisted laparoscopic  cholecystectomy for July 13.     Time spent with the patient was 60 minutes, with more than 50% of the time spent in face-to-face education, counseling and care coordination.     Clayburn Pert, MD FACS General Surgeon 02/03/2016, 3:17 PM

## 2016-03-04 ENCOUNTER — Telehealth: Payer: Self-pay

## 2016-03-04 ENCOUNTER — Telehealth: Payer: Self-pay | Admitting: General Surgery

## 2016-03-04 LAB — SURGICAL PATHOLOGY

## 2016-03-04 NOTE — Telephone Encounter (Signed)
Patient had a lap chole yesterday and was prescribed tramadol. She feels it is to strong and making her sick. Can she get something else?

## 2016-03-04 NOTE — Telephone Encounter (Signed)
I called Sharon Herring back to let her know I spoke with Amber regarding a lower dose narcotic and was told that Sharon Herring is considered to be the lowest. We discussed alternating  Tylenol and ibuprofen for the discomfort as  Sharon Herring stated she had done so in the past.  Sharon Herring has stopped the Tramodol and is using some topical benadryl for the rash. Sharon Herring was told to call office if she has any questions or concerns.

## 2016-03-04 NOTE — Telephone Encounter (Signed)
Patient called stating that she was in a lot of pain and wanted to know if she could take ibuprofen or tylenol since she feels the Tramadol is too strong. After consulting with Dr. Ancil Boozer, she was informed that she could take either ibuprofen or Tylenol but she prefers for her to use the tylenol. She also told her that it is a very painful procedure so it is expected to for her to be sore. She stated that she probably needed a medication for nausea and if she did not get a call back from the surgeon's office to give [name] a call back then we could send something in.

## 2016-03-04 NOTE — Telephone Encounter (Signed)
I spoke with patient and she states the Tramadol she is taking is to strong and making her sick. She has developed a rash also. She asked if there was something else she could take and stated she has taken ibuprofen for other things and it helped. We discussed Tylenol as well and that she could use both just not at the same time. She asked if the doctor would be able to change  Tramadol medication  To something milder and was told i would check and call her back.

## 2016-03-07 ENCOUNTER — Telehealth: Payer: Self-pay | Admitting: General Surgery

## 2016-03-07 ENCOUNTER — Telehealth: Payer: Self-pay

## 2016-03-07 MED ORDER — ONDANSETRON 4 MG PO TBDP: 4.0000 mg | ORAL_TABLET | Freq: Three times a day (TID) | ORAL | Status: DC | PRN

## 2016-03-07 NOTE — Telephone Encounter (Signed)
Having a nausea feeling. Sick Saturday and Sunday throwing up. Soft diet. When she stands she is light headed. When can she take the patches off? Did we get disability papers?

## 2016-03-07 NOTE — Telephone Encounter (Signed)
Patient's [employer] disability form was filled out and faxed.

## 2016-03-07 NOTE — Telephone Encounter (Signed)
Returned phone call to patient at this time. Patient attempted to eat solid foods over the weekend and vomited. She is now back to soup and crackers. Has not had a bowel movement since surgery and is not taking pain medication. Taking Ibuprofen for pain at this time but unable to hold solid foods down. Denies abdominal pain and fever/chills.  Informed patient that they should increase water intake and activity level as much as possible to help with bowel movement. Also educated that they may use Miralax over the counter x 2 doses, 6 hours apart, followed by Dulcolax x 2 doses, 6 hours apart until they have a successful bowel movement. If they are unsuccessful with oral medications, they will need to do 2 fleets enemas back to back. Asked patient to call back for further instructions if after taking all doses of these medications, they are still unsuccessful with a bowel movement.  Spoke with Dr. Adonis Huguenin regarding patient. He was advised of all above information and would like Zofran sent to preferred pharmacy at this time. Medication sent. Patient was informed that medication has been sent.  In regards to Disability paperwork, It is currently being filled out and will be faxed this afternoon.  Patient verbalizes understanding of this information.

## 2016-03-08 ENCOUNTER — Other Ambulatory Visit: Payer: Self-pay

## 2016-03-10 ENCOUNTER — Ambulatory Visit (INDEPENDENT_AMBULATORY_CARE_PROVIDER_SITE_OTHER): Payer: 59 | Admitting: General Surgery

## 2016-03-10 ENCOUNTER — Encounter: Payer: Self-pay | Admitting: General Surgery

## 2016-03-10 VITALS — BP 134/90 | HR 70 | Temp 98.2°F | Ht 61.0 in | Wt 210.0 lb

## 2016-03-10 DIAGNOSIS — Z4889 Encounter for other specified surgical aftercare: Secondary | ICD-10-CM

## 2016-03-10 MED ORDER — BISACODYL 10 MG RE SUPP: 10.0000 mg | RECTAL | Status: DC | PRN

## 2016-03-10 NOTE — Progress Notes (Signed)
Outpatient Surgical Follow Up  03/10/2016  Sharon Herring is an 40 y.o. female.   Chief Complaint  Patient presents with  . Routine Post Op    Cholecystectomy-03/03/16-Dr.Kiyara Bouffard    HPI: 40 year old female returns to clinic for follow-up 1 week status post laparoscopic cholecystectomy. Patient reports that the pain she was having from her right upper quadrant prior to surgery is completely gone. She has been tolerating a diet without nausea or vomiting. She does report she's had significant constipation since surgery with only small bowel movements. She denies any fevers, chills, nausea, vomiting, chest pain, shortness of breath. She does have some discomfort at her umbilical incision however other incisions are completely pain free. She is only taking Tylenol for pain control.  Past Medical History  Diagnosis Date  . Fibroid uterus   . Anemia   . Polyp of stomach 01-06-16    Stomach polyp, pyloric  . IBS (irritable bowel syndrome)   . GERD (gastroesophageal reflux disease)     Past Surgical History  Procedure Laterality Date  . Uterine fibroid surgery  2012  . Appendectomy  2012  . Esophagogastroduodenoscopy (egd) with propofol N/A 01/06/2016    Procedure: ESOPHAGOGASTRODUODENOSCOPY (EGD) WITH PROPOFOL;  Surgeon: Robert Bellow, MD;  Location: [facility] ENDOSCOPY;  Service: Endoscopy;  Laterality: N/A;  . Robotic assisted laparoscopic cholecystectomy N/A 03/03/2016    Procedure: ROBOTIC ASSISTED LAPAROSCOPIC CHOLECYSTECTOMY ;  Surgeon: Clayburn Pert, MD;  Location: [facility] ORS;  Service: General;  Laterality: N/A;    Family History  Problem Relation Age of Onset  . Hypertension Mother   . Hypertension Father     Social History:  reports that she has never smoked. She has never used smokeless tobacco. She reports that she does not drink alcohol or use illicit drugs.  Allergies:  Allergies  Allergen Reactions  . Aspirin Nausea And Vomiting  . Oxycodone-Acetaminophen Rash and  Shortness Of Breath  . Oxycodone-Acetaminophen Rash and Shortness Of Breath  . Tape Other (See Comments)    Burning feeling. Has left mark on skin.  . Dicyclomine Nausea And Vomiting  . Norethindrone Rash    Medications reviewed.    ROS A multipoint review of systems was completed, all pertinent positives and negatives are documented within the history of present illness and remainder are negative   BP 134/90 mmHg  Pulse 70  Temp(Src) 98.2 F (36.8 C) (Oral)  Ht 5\' 1"  (1.549 m)  Wt 95.255 kg (210 lb)  BMI 39.70 kg/m2  LMP 02/24/2016  Physical Exam Gen.: No acute distress Chest: Clear to auscultation Heart: Regular rate and rhythm Abdomen: Soft, nondistended. Well approximated laparoscopic incision sites without evidence of erythema or drainage. Appropriately tender to palpation at her umbilical incision remainder are nontender. Steri-Strips remain in place.    No results found for this or any previous visit (from the past 48 hour(s)). No results found.  Assessment/Plan:  1. Aftercare following surgery 40 year old female one week status post laparoscopic cholecystectomy. Having some constipation in the postoperative period. Discussed MiraLAX usage as well as suppository uses with the patient. Also discussed that should she fail to reveal tolerated diet secondary to constipation she is to return to clinic for the emergency room for further evaluation. Pathology is reviewed with the patient and standard postoperative precautions were provided. She voiced understanding we'll follow up on an as-needed basis.     Clayburn Pert, MD FACS General Surgeon  03/10/2016,1:37 PM

## 2016-03-10 NOTE — Patient Instructions (Signed)
Constipation, Adult Constipation is when a person has fewer than three bowel movements a week, has difficulty having a bowel movement, or has stools that are dry, hard, or larger than normal. As people grow older, constipation is more common. A low-fiber diet, not taking in enough fluids, and taking certain medicines may make constipation worse.  CAUSES   Certain medicines, such as antidepressants, pain medicine, iron supplements, antacids, and water pills.   Certain diseases, such as diabetes, irritable bowel syndrome (IBS), thyroid disease, or depression.   Not drinking enough water.   Not eating enough fiber-rich foods.   Stress or travel.   Lack of physical activity or exercise.   Ignoring the urge to have a bowel movement.   Using laxatives too much.  SIGNS AND SYMPTOMS   Having fewer than three bowel movements a week.   Straining to have a bowel movement.   Having stools that are hard, dry, or larger than normal.   Feeling full or bloated.   Pain in the lower abdomen.   Not feeling relief after having a bowel movement.  DIAGNOSIS  Your health care provider will take a medical history and perform a physical exam. Further testing may be done for severe constipation. Some tests may include:  A barium enema X-ray to examine your rectum, colon, and, sometimes, your small intestine.   A sigmoidoscopy to examine your lower colon.   A colonoscopy to examine your entire colon. TREATMENT  Treatment will depend on the severity of your constipation and what is causing it. Some dietary treatments include drinking more fluids and eating more fiber-rich foods. Lifestyle treatments may include regular exercise. If these diet and lifestyle recommendations do not help, your health care provider may recommend taking over-the-counter laxative medicines to help you have bowel movements. Prescription medicines may be prescribed if over-the-counter medicines do not work.   HOME CARE INSTRUCTIONS   Eat foods that have a lot of fiber, such as fruits, vegetables, whole grains, and beans.  Limit foods high in fat and processed sugars, such as french fries, hamburgers, cookies, candies, and soda.   A fiber supplement may be added to your diet if you cannot get enough fiber from foods.   Drink enough fluids to keep your urine clear or pale yellow.   Exercise regularly or as directed by your health care provider.   Go to the restroom when you have the urge to go. Do not hold it.   Only take over-the-counter or prescription medicines as directed by your health care provider. Do not take other medicines for constipation without talking to your health care provider first.  [place] IF:   You have bright red blood in your stool.   Your constipation lasts for more than 4 days or gets worse.   You have abdominal or rectal pain.   You have thin, pencil-like stools.   You have unexplained weight loss. MAKE SURE YOU:   Understand these instructions.  Will watch your condition.  Will get help right away if you are not doing well or get worse.   This information is not intended to replace advice given to you by your health care provider. Make sure you discuss any questions you have with your health care provider.   Document Released: 05/06/2004 Document Revised: 08/29/2014 Document Reviewed: 05/20/2013 Elsevier Interactive Patient Education [employer].   Please call our office if you have any questions or concerns.

## 2016-03-28 ENCOUNTER — Encounter: Payer: Self-pay | Admitting: Family Medicine

## 2016-03-28 ENCOUNTER — Ambulatory Visit (INDEPENDENT_AMBULATORY_CARE_PROVIDER_SITE_OTHER): Payer: 59 | Admitting: Family Medicine

## 2016-03-28 VITALS — BP 118/78 | HR 91 | Temp 98.1°F | Resp 18 | Wt 217.1 lb

## 2016-03-28 DIAGNOSIS — E559 Vitamin D deficiency, unspecified: Secondary | ICD-10-CM

## 2016-03-28 DIAGNOSIS — E669 Obesity, unspecified: Secondary | ICD-10-CM | POA: Diagnosis not present

## 2016-03-28 DIAGNOSIS — E538 Deficiency of other specified B group vitamins: Secondary | ICD-10-CM | POA: Insufficient documentation

## 2016-03-28 DIAGNOSIS — K5900 Constipation, unspecified: Secondary | ICD-10-CM | POA: Diagnosis not present

## 2016-03-28 DIAGNOSIS — K5909 Other constipation: Secondary | ICD-10-CM

## 2016-03-28 DIAGNOSIS — R739 Hyperglycemia, unspecified: Secondary | ICD-10-CM | POA: Insufficient documentation

## 2016-03-28 MED ORDER — LIRAGLUTIDE -WEIGHT MANAGEMENT 18 MG/3ML ~~LOC~~ SOPN: 3.0000 mg | PEN_INJECTOR | Freq: Every day | SUBCUTANEOUS | 2 refills | Status: DC

## 2016-03-28 MED ORDER — VITAMIN D (ERGOCALCIFEROL) 1.25 MG (50000 UNIT) PO CAPS: 50000.0000 [IU] | ORAL_CAPSULE | ORAL | 0 refills | Status: DC

## 2016-03-28 MED ORDER — CYANOCOBALAMIN 1000 MCG/ML IJ SOLN
1000.0000 ug | Freq: Once | INTRAMUSCULAR | Status: AC
  Administered 2016-03-28: 1000 ug via INTRAMUSCULAR

## 2016-03-28 NOTE — Progress Notes (Signed)
Name: Sharon Herring   MRN: [id number]    DOB: 11-13-1975   Date:03/28/2016       Progress Note  Subjective  Chief Complaint  Chief Complaint  Patient presents with  . Obesity    follow up visit    HPI  Constipation: chronic, worse since cholecystectomy ( 03/03/2016), no bowel movements in the past 5 days, but denies pain, bloating or straining. Discussed with surgeon and they advised her to hold off one more week and bowel movements should normalized. Patient may try to take 2 linzess in the next few days to see if it improves symptoms  Obesity: she states she gained weight at age 40. She states it happened when her father was diagnosed with brain cancer and she was eating because of stress. The highest weight was 221 lbs. She has tried exercise and portion control without much help. She has also tried Phentermine and lost 8-9 lbs and was able to keep it down for about one year. She has also tried Contrave that made her feel groggy and Belviq that caused headaches. She has read about Saxenda and would like to try it.    Patient Active Problem List   Diagnosis Date Noted  . Biliary colic   . Multiple gastric polyps 01/08/2016  . Biliary dyskinesia 12/31/2015  . Abdominal pain, epigastric 12/31/2015  . Nausea with vomiting 12/31/2015  . Menorrhagia 11/13/2015  . Chronic constipation 09/23/2015  . Headache 09/23/2015  . Obesity, Class I, BMI 30.0-34.9 (see actual BMI) 06/02/2015  . Encounter for cholesteral screening for cardiovascular disease 06/02/2015  . Iron deficiency anemia 03/14/2014  . History of uterine fibroid 03/14/2014    Past Surgical History:  Procedure Laterality Date  . APPENDECTOMY  2012  . ESOPHAGOGASTRODUODENOSCOPY (EGD) WITH PROPOFOL N/A 01/06/2016   Procedure: ESOPHAGOGASTRODUODENOSCOPY (EGD) WITH PROPOFOL;  Surgeon: Robert Bellow, MD;  Location: [facility] ENDOSCOPY;  Service: Endoscopy;  Laterality: N/A;  . ROBOTIC ASSISTED LAPAROSCOPIC CHOLECYSTECTOMY N/A  03/03/2016   Procedure: ROBOTIC ASSISTED LAPAROSCOPIC CHOLECYSTECTOMY ;  Surgeon: Clayburn Pert, MD;  Location: [facility] ORS;  Service: General;  Laterality: N/A;  . UTERINE FIBROID SURGERY  2012    Family History  Problem Relation Age of Onset  . Hypertension Mother   . Hypertension Father     Social History   Social History  . Marital status: Single    Spouse name: N/A  . Number of children: N/A  . Years of education: N/A   Occupational History  . Not on file.   Social History Main Topics  . Smoking status: Never Smoker  . Smokeless tobacco: Never Used  . Alcohol use No  . Drug use: No  . Sexual activity: Not Currently   Other Topics Concern  . Not on file   Social History Narrative  . No narrative on file     Current Outpatient Prescriptions:  .  docusate sodium (COLACE) 100 MG capsule, Take 1 capsule (100 mg total) by mouth daily. (Patient taking differently: Take 100 mg by mouth daily as needed for mild constipation. ), Disp: 90 capsule, Rfl: 3 .  linaclotide (LINZESS) 145 MCG CAPS capsule, Take 1 capsule (145 mcg total) by mouth daily before breakfast. (Patient taking differently: Take 145 mcg by mouth as needed. ), Disp: 30 capsule, Rfl: 2 .  omeprazole (PRILOSEC) 40 MG capsule, Take 1 capsule (40 mg total) by mouth daily. (Patient taking differently: Take 40 mg by mouth every other day. Pt takes this in the AM),  Disp: 30 capsule, Rfl: 1 .  ondansetron (ZOFRAN-ODT) 4 MG disintegrating tablet, Take 1 tablet (4 mg total) by mouth every 8 (eight) hours as needed for nausea or vomiting., Disp: 20 tablet, Rfl: 0  Allergies  Allergen Reactions  . Aspirin Nausea And Vomiting  . Oxycodone-Acetaminophen Rash and Shortness Of Breath  . Oxycodone-Acetaminophen Rash and Shortness Of Breath  . Tape Other (See Comments)    Burning feeling. Has left mark on skin.  . Dicyclomine Nausea And Vomiting  . Norethindrone Rash     ROS  Constitutional: Negative for fever or  weight change.  Respiratory: Negative for cough and shortness of breath.   Cardiovascular: Negative for chest pain or palpitations.  Gastrointestinal: Negative for abdominal pain, no bowel changes.  Musculoskeletal: Negative for gait problem or joint swelling.  Skin: Negative for rash.  Neurological: Negative for dizziness or headache.  No other specific complaints in a complete review of systems (except as listed in HPI above).  Objective  Vitals:   03/28/16 1451  BP: 118/78  Pulse: 91  Resp: 18  Temp: 98.1 F (36.7 C)  SpO2: 99%  Weight: 217 lb 2 oz (98.5 kg)    Body mass index is 41.03 kg/m.  Physical Exam  Constitutional: Patient appears well-developed and well-nourished. Obese  No distress.  HEENT: head atraumatic, normocephalic, pupils equal and reactive to light,  neck supple, throat within normal limits Cardiovascular: Normal rate, regular rhythm and normal heart sounds.  No murmur heard. No BLE edema. Pulmonary/Chest: Effort normal and breath sounds normal. No respiratory distress. Abdominal: Soft.  There is mild tenderness ( expected from recent surgery ). Psychiatric: Patient has a normal mood and affect. behavior is normal. Judgment and thought content normal.  Recent Results (from the past 2160 hour(s))  Pregnancy, urine POC     Status: None   Collection Time: 01/06/16  1:22 PM  Result Value Ref Range   Preg Test, Ur NEGATIVE NEGATIVE    Comment:        THE SENSITIVITY OF THIS METHODOLOGY IS >24 mIU/mL   Surgical pathology     Status: None   Collection Time: 01/06/16  2:58 PM  Result Value Ref Range   SURGICAL PATHOLOGY      Surgical Pathology CASE: [id number] PATIENT: Catrina GREEN Surgical Pathology Report     SPECIMEN SUBMITTED: A. Stomach polyp, pyloric; hot snare B. Stomach polyp, lesser curvature; hot snare  CLINICAL HISTORY: None provided  PRE-OPERATIVE DIAGNOSIS: Gastric ABD pain, nausea and vomiting  POST-OPERATIVE  DIAGNOSIS: None provided.     DIAGNOSIS: A. STOMACH POLYP, PYLORIC; HOT SNARE: - MILDLY INFLAMED POLYPOID ANTRAL MUCOSA WITH FEATURES SUGGESTIVE OF PROLAPSE. - PROMINENT THERMAL ARTIFACT LIMITS MICROSCOPIC EVALUATION.  B. STOMACH POLYP, LESSER CURVATURE; HOT SNARE: - MILDLY INFLAMED POLYPOID ANTRAL MUCOSA WITH PROLAPSE CHANGE, CONSISTENT WITH PROLAPSE-TYPE POLYP. - NEGATIVE FOR DYSPLASIA AND MALIGNANCY.   GROSS DESCRIPTION:  A. Labeled: hot snare pyloric polyp  Tissue fragment(s): 2  Size: 0.5 and 0.6 cm  Description: tan fragments, largest fragment inked blue at the base, bisected  Entirely submitted in one cassette(s).   B. Labeled:  hot snare lesser curvature polyp  Tissue fragment(s): 1  Size: 1.0 x 0.9 x 0.6 cm  Description: tan polypoid fragment, inked blue at the base, sectioned  Entirely submitted in 1 cassette(s).    Final Diagnosis performed by Quay Burow, MD.  Electronically signed 01/08/2016 10:34:03AM    The electronic signature indicates that the named Attending Pathologist has evaluated the  specimen  Technical component performed at [facility], [address], [place], [place] [address] Lab: [phone] Dir: Darrick Penna. Evette Doffing, MD  Professional component performed at [facility], [facility], [place], [place], [place] [address] Lab: [phone] Dir: Dellia Nims. Rubinas, MD    CBC with Differential/Platelet     Status: Abnormal   Collection Time: 01/22/16 10:38 AM  Result Value Ref Range   WBC 6.1 3.6 - 11.0 K/uL   RBC 4.11 3.80 - 5.20 MIL/uL   Hemoglobin 10.2 (L) 12.0 - 16.0 g/dL   HCT 31.0 (L) 35.0 - 47.0 %   MCV 75.5 (L) 80.0 - 100.0 fL   MCH 24.7 (L) 26.0 - 34.0 pg   MCHC 32.7 32.0 - 36.0 g/dL   RDW 15.7 (H) 11.5 - 14.5 %   Platelets 458 (H) 150 - 440 K/uL   Neutrophils Relative % 57 %   Neutro Abs 3.4 1.4 - 6.5 K/uL   Lymphocytes Relative 33 %   Lymphs Abs 2.0 1.0 - 3.6 K/uL   Monocytes Relative 9 %    Monocytes Absolute 0.6 0.2 - 0.9 K/uL   Eosinophils Relative 1 %   Eosinophils Absolute 0.0 0 - 0.7 K/uL   Basophils Relative 0 %   Basophils Absolute 0.0 0 - 0.1 K/uL  Pregnancy, urine POC     Status: None   Collection Time: 03/03/16  6:41 AM  Result Value Ref Range   Preg Test, Ur NEGATIVE NEGATIVE    Comment:        THE SENSITIVITY OF THIS METHODOLOGY IS >24 mIU/mL   Surgical pathology     Status: None   Collection Time: 03/03/16  7:32 AM  Result Value Ref Range   SURGICAL PATHOLOGY      Surgical Pathology CASE: [id number] PATIENT: Makhya GREEN Surgical Pathology Report     SPECIMEN SUBMITTED: A. Gallbladder  CLINICAL HISTORY: None provided  PRE-OPERATIVE DIAGNOSIS: Biliary dyskinesia  POST-OPERATIVE DIAGNOSIS: Same as pre-op     DIAGNOSIS: A. GALLBLADDER; LAPAROSCOPIC CHOLECYSTECTOMY: - CHRONIC CHOLECYSTITIS. - NEGATIVE FOR MALIGNANCY.   GROSS DESCRIPTION:  A. Labeled: gallbladder  Size of specimen: 8.4 x 3.0 x 2.5 cm  Previously opened: no  External surface: smooth purple  Wall thickness: 0.2 cm  Mucosa: velvety green with focal yellow stippling  Stones present: no  Other findings: duct margin inked blue  Block summary: 1 - representative section and en face duct margin    Final Diagnosis performed by Quay Burow, MD.  Electronically signed 03/04/2016 9:21:11AM    The electronic signature indicates that the named Attending Pathologist has evaluated the specimen  Technical component performed at [facility], [place], [place], [place] [address] Lab: [phone] Dir: Darrick Penna. Evette Doffing, MD  Professional component performed at [facility], [facility], [place], [place], [place] [address] Lab: [phone] Dir: Dellia Nims. Rubinas, MD        PHQ2/9: Depression screen [facility] 2/9 03/28/2016 12/16/2015 11/13/2015 06/02/2015 05/12/2015  Decreased Interest 0 0 0 0 0  Down, Depressed, Hopeless 0 0 0 0 0  PHQ - 2  Score 0 0 0 0 0     Fall Risk: Fall Risk  03/28/2016 12/16/2015 11/13/2015 06/02/2015 05/12/2015  Falls in the past year? No No No No No      Assessment & Plan   1. Chronic constipation  Increase water intake and fiber in diet, my double dose of Linzess for the next week if desired  2. Obesity, Class I, BMI  30.0-34.9 (see actual BMI)  Discussed with the patient the risk posed by an increased BMI. Discussed importance of portion control, calorie counting and at least 150 minutes of physical activity weekly. Avoid sweet beverages and drink more water. Eat at least 6 servings of fruit and vegetables daily Discussed possible side effects of medication  - Liraglutide -Weight Management (SAXENDA) 18 MG/3ML SOPN; Inject 3 mg into the skin daily.  Dispense: 5 pen; Refill: 2  3. B12 deficiency  - cyanocobalamin ((VITAMIN B-12)) injection 1,000 mcg; Inject 1 mL (1,000 mcg total) into the muscle once.  4. Vitamin D deficiency  - Vitamin D, Ergocalciferol, (DRISDOL) 50000 units CAPS capsule; Take 1 capsule (50,000 Units total) by mouth every 7 (seven) days.  Dispense: 12 capsule; Refill: 0  5. Hyperglycemia  - Liraglutide -Weight Management (SAXENDA) 18 MG/3ML SOPN; Inject 3 mg into the skin daily.  Dispense: 5 pen; Refill: 2

## 2016-04-05 ENCOUNTER — Emergency Department (HOSPITAL_COMMUNITY)
Admission: EM | Admit: 2016-04-05 | Discharge: 2016-04-06 | Disposition: A | Discharge status: Home / Self Care | Payer: 59 | Attending: Emergency Medicine | Admitting: Emergency Medicine

## 2016-04-05 ENCOUNTER — Emergency Department (HOSPITAL_COMMUNITY): Payer: 59

## 2016-04-05 ENCOUNTER — Encounter (HOSPITAL_COMMUNITY): Payer: Self-pay | Admitting: Emergency Medicine

## 2016-04-05 DIAGNOSIS — S0083XA Contusion of other part of head, initial encounter: Secondary | ICD-10-CM | POA: Diagnosis not present

## 2016-04-05 DIAGNOSIS — S0093XA Contusion of unspecified part of head, initial encounter: Secondary | ICD-10-CM | POA: Diagnosis not present

## 2016-04-05 DIAGNOSIS — M542 Cervicalgia: Secondary | ICD-10-CM | POA: Insufficient documentation

## 2016-04-05 DIAGNOSIS — G4489 Other headache syndrome: Secondary | ICD-10-CM | POA: Diagnosis not present

## 2016-04-05 DIAGNOSIS — Y9389 Activity, other specified: Secondary | ICD-10-CM | POA: Diagnosis not present

## 2016-04-05 DIAGNOSIS — R51 Headache: Secondary | ICD-10-CM | POA: Diagnosis present

## 2016-04-05 DIAGNOSIS — Y999 Unspecified external cause status: Secondary | ICD-10-CM | POA: Diagnosis not present

## 2016-04-05 DIAGNOSIS — Y92410 Unspecified street and highway as the place of occurrence of the external cause: Secondary | ICD-10-CM | POA: Diagnosis not present

## 2016-04-05 DIAGNOSIS — S0990XA Unspecified injury of head, initial encounter: Secondary | ICD-10-CM | POA: Diagnosis not present

## 2016-04-05 MED ORDER — ACETAMINOPHEN 500 MG PO TABS
1000.0000 mg | ORAL_TABLET | Freq: Once | ORAL | Status: AC
  Administered 2016-04-05: 1000 mg via ORAL
  Filled 2016-04-05: qty 2
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
## 2016-04-06 DIAGNOSIS — M542 Cervicalgia: Secondary | ICD-10-CM | POA: Diagnosis not present

## 2016-04-06 DIAGNOSIS — S199XXA Unspecified injury of neck, initial encounter: Secondary | ICD-10-CM | POA: Diagnosis not present

## 2016-04-06 MED ORDER — CYCLOBENZAPRINE HCL 5 MG PO TABS: 5.0000 mg | ORAL_TABLET | Freq: Three times a day (TID) | ORAL | 0 refills | Status: DC | PRN

## 2016-04-06 MED ORDER — NAPROXEN 500 MG PO TABS: ORAL_TABLET | ORAL | 0 refills | Status: DC

## 2016-04-06 NOTE — Discharge Instructions (Signed)
Ice packs to the injured or sore muscles for the next several days then start using heat. Take the medications for pain and muscle spasms. Return to the ED for any problems listed on the head injury sheet. Recheck if you aren't improving in the next week. ° °

## 2016-04-06 NOTE — ED Notes (Signed)
C-collar removed as per Dr. Tomi Bamberger

## 2016-04-07 ENCOUNTER — Encounter: Payer: Self-pay | Admitting: Family Medicine

## 2016-04-07 ENCOUNTER — Ambulatory Visit (INDEPENDENT_AMBULATORY_CARE_PROVIDER_SITE_OTHER): Payer: 59 | Admitting: Family Medicine

## 2016-04-07 VITALS — BP 124/82 | HR 90 | Temp 97.3°F | Resp 18 | Wt 209.2 lb

## 2016-04-07 DIAGNOSIS — S060X0A Concussion without loss of consciousness, initial encounter: Secondary | ICD-10-CM

## 2016-04-07 DIAGNOSIS — Z043 Encounter for examination and observation following other accident: Secondary | ICD-10-CM | POA: Diagnosis not present

## 2016-04-07 DIAGNOSIS — G44309 Post-traumatic headache, unspecified, not intractable: Secondary | ICD-10-CM | POA: Diagnosis not present

## 2016-04-07 DIAGNOSIS — Z041 Encounter for examination and observation following transport accident: Secondary | ICD-10-CM

## 2016-04-07 MED ORDER — AMITRIPTYLINE HCL 25 MG PO TABS: 25.0000 mg | ORAL_TABLET | Freq: Every day | ORAL | 0 refills | Status: DC

## 2016-04-07 NOTE — Patient Instructions (Signed)
Concussion, Adult  A concussion, or closed-head injury, is a brain injury caused by a direct blow to the head or by a quick and sudden movement (jolt) of the head or neck. Concussions are usually not life-threatening. Even so, the effects of a concussion can be serious. If you have had a concussion before, you are more likely to experience concussion-like symptoms after a direct blow to the head.   CAUSES  · Direct blow to the head, such as from running into another player during a soccer game, being hit in a fight, or hitting your head on a hard surface.  · A jolt of the head or neck that causes the brain to move back and forth inside the skull, such as in a car crash.  SIGNS AND SYMPTOMS  The signs of a concussion can be hard to notice. Early on, they may be missed by you, family members, and health care providers. You may look fine but act or feel differently.  Symptoms are usually temporary, but they may last for days, weeks, or even longer. Some symptoms may appear right away while others may not show up for hours or days. Every head injury is different. Symptoms include:  · Mild to moderate headaches that will not go away.  · A feeling of pressure inside your head.  · Having more trouble than usual:    Learning or remembering things you have heard.    Answering questions.    Paying attention or concentrating.    Organizing daily tasks.    Making decisions and solving problems.  · Slowness in thinking, acting or reacting, speaking, or reading.  · Getting lost or being easily confused.  · Feeling tired all the time or lacking energy (fatigued).  · Feeling drowsy.  · Sleep disturbances.    Sleeping more than usual.    Sleeping less than usual.    Trouble falling asleep.    Trouble sleeping (insomnia).  · Loss of balance or feeling lightheaded or dizzy.  · Nausea or vomiting.  · Numbness or tingling.  · Increased sensitivity to:    Sounds.    Lights.    Distractions.  · Vision problems or eyes that tire  easily.  · Diminished sense of taste or smell.  · Ringing in the ears.  · Mood changes such as feeling sad or anxious.  · Becoming easily irritated or angry for little or no reason.  · Lack of motivation.  · Seeing or hearing things other people do not see or hear (hallucinations).  DIAGNOSIS  Your health care provider can usually diagnose a concussion based on a description of your injury and symptoms. He or she will ask whether you passed out (lost consciousness) and whether you are having trouble remembering events that happened right before and during your injury.  Your evaluation might include:  · A brain scan to look for signs of injury to the brain. Even if the test shows no injury, you may still have a concussion.  · Blood tests to be sure other problems are not present.  TREATMENT  · Concussions are usually treated in an emergency department, in urgent care, or at a clinic. You may need to stay in the hospital overnight for further treatment.  · Tell your health care provider if you are taking any medicines, including prescription medicines, over-the-counter medicines, and natural remedies. Some medicines, such as blood thinners (anticoagulants) and aspirin, may increase the chance of complications. Also tell your health care   provider whether you have had alcohol or are taking illegal drugs. This information may affect treatment.  · Your health care provider will send you home with important instructions to follow.  · How fast you will recover from a concussion depends on many factors. These factors include how severe your concussion is, what part of your brain was injured, your age, and how healthy you were before the concussion.  · Most people with mild injuries recover fully. Recovery can take time. In general, recovery is slower in older persons. Also, persons who have had a concussion in the past or have other medical problems may find that it takes longer to recover from their current injury.  HOME  CARE INSTRUCTIONS  General Instructions  · Carefully follow the directions your health care provider gave you.  · Only take over-the-counter or prescription medicines for pain, discomfort, or fever as directed by your health care provider.  · Take only those medicines that your health care provider has approved.  · Do not drink alcohol until your health care provider says you are well enough to do so. Alcohol and certain other drugs may slow your recovery and can put you at risk of further injury.  · If it is harder than usual to remember things, write them down.  · If you are easily distracted, try to do one thing at a time. For example, do not try to watch TV while fixing dinner.  · Talk with family members or close friends when making important decisions.  · Keep all follow-up appointments. Repeated evaluation of your symptoms is recommended for your recovery.  · Watch your symptoms and tell others to do the same. Complications sometimes occur after a concussion. Older adults with a brain injury may have a higher risk of serious complications, such as a blood clot on the brain.  · Tell your teachers, school nurse, school counselor, coach, athletic trainer, or work manager about your injury, symptoms, and restrictions. Tell them about what you can or cannot do. They should watch for:    Increased problems with attention or concentration.    Increased difficulty remembering or learning new information.    Increased time needed to complete tasks or assignments.    Increased irritability or decreased ability to cope with stress.    Increased symptoms.  · Rest. Rest helps the brain to heal. Make sure you:    Get plenty of sleep at night. Avoid staying up late at night.    Keep the same bedtime hours on weekends and weekdays.    Rest during the day. Take daytime naps or rest breaks when you feel tired.  · Limit activities that require a lot of thought or concentration. These include:    Doing homework or job-related  work.    Watching TV.    Working on the computer.  · Avoid any situation where there is potential for another head injury (football, hockey, soccer, basketball, martial arts, downhill snow sports and horseback riding). Your condition will get worse every time you experience a concussion. You should avoid these activities until you are evaluated by the appropriate follow-up health care providers.  Returning To Your Regular Activities  You will need to return to your normal activities slowly, not all at once. You must give your body and brain enough time for recovery.  · Do not return to sports or other athletic activities until your health care provider tells you it is safe to do so.  · Ask   your health care provider when you can drive, ride a bicycle, or operate heavy machinery. Your ability to react may be slower after a brain injury. Never do these activities if you are dizzy.  · Ask your health care provider about when you can return to work or school.  Preventing Another Concussion  It is very important to avoid another brain injury, especially before you have recovered. In rare cases, another injury can lead to permanent brain damage, brain swelling, or death. The risk of this is greatest during the first 7-10 days after a head injury. Avoid injuries by:  · Wearing a seat belt when riding in a car.  · Drinking alcohol only in moderation.  · Wearing a helmet when biking, skiing, skateboarding, skating, or doing similar activities.  · Avoiding activities that could lead to a second concussion, such as contact or recreational sports, until your health care provider says it is okay.  · Taking safety measures in your home.    Remove clutter and tripping hazards from floors and stairways.    Use grab bars in bathrooms and handrails by stairs.    Place non-slip mats on floors and in bathtubs.    Improve lighting in dim areas.  SEEK MEDICAL CARE IF:  · You have increased problems paying attention or  concentrating.  · You have increased difficulty remembering or learning new information.  · You need more time to complete tasks or assignments than before.  · You have increased irritability or decreased ability to cope with stress.  · You have more symptoms than before.  Seek medical care if you have any of the following symptoms for more than 2 weeks after your injury:  · Lasting (chronic) headaches.  · Dizziness or balance problems.  · Nausea.  · Vision problems.  · Increased sensitivity to noise or light.  · Depression or mood swings.  · Anxiety or irritability.  · Memory problems.  · Difficulty concentrating or paying attention.  · Sleep problems.  · Feeling tired all the time.  SEEK IMMEDIATE MEDICAL CARE IF:  · You have severe or worsening headaches. These may be a sign of a blood clot in the brain.  · You have weakness (even if only in one hand, leg, or part of the face).  · You have numbness.  · You have decreased coordination.  · You vomit repeatedly.  · You have increased sleepiness.  · One pupil is larger than the other.  · You have convulsions.  · You have slurred speech.  · You have increased confusion. This may be a sign of a blood clot in the brain.  · You have increased restlessness, agitation, or irritability.  · You are unable to recognize people or places.  · You have neck pain.  · It is difficult to wake you up.  · You have unusual behavior changes.  · You lose consciousness.  MAKE SURE YOU:  · Understand these instructions.  · Will watch your condition.  · Will get help right away if you are not doing well or get worse.     This information is not intended to replace advice given to you by your health care provider. Make sure you discuss any questions you have with your health care provider.     Document Released: 10/29/2003 Document Revised: 08/29/2014 Document Reviewed: 02/28/2013  [employer] Interactive Patient Education ©2016 [employer]
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
## 2016-04-11 ENCOUNTER — Ambulatory Visit (INDEPENDENT_AMBULATORY_CARE_PROVIDER_SITE_OTHER): Payer: 59 | Admitting: Family Medicine

## 2016-04-11 ENCOUNTER — Encounter: Payer: Self-pay | Admitting: Family Medicine

## 2016-04-11 VITALS — BP 124/82 | HR 94 | Temp 98.5°F | Resp 16 | Ht 65.0 in | Wt 209.2 lb

## 2016-04-11 DIAGNOSIS — S060X0A Concussion without loss of consciousness, initial encounter: Secondary | ICD-10-CM

## 2016-04-11 DIAGNOSIS — G44309 Post-traumatic headache, unspecified, not intractable: Secondary | ICD-10-CM
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
## 2016-04-19 DIAGNOSIS — Z760 Encounter for issue of repeat prescription: Secondary | ICD-10-CM | POA: Diagnosis not present

## 2016-05-16 ENCOUNTER — Other Ambulatory Visit: Payer: Self-pay | Admitting: Family Medicine

## 2016-05-16 MED ORDER — INSULIN PEN NEEDLE 32G X 6 MM MISC: 1.0000 | Freq: Every day | 1 refills | Status: DC

## 2016-06-01 ENCOUNTER — Other Ambulatory Visit (INDEPENDENT_AMBULATORY_CARE_PROVIDER_SITE_OTHER): Payer: 59

## 2016-06-01 ENCOUNTER — Other Ambulatory Visit: Payer: Self-pay

## 2016-06-01 DIAGNOSIS — E538 Deficiency of other specified B group vitamins: Secondary | ICD-10-CM

## 2016-06-01 MED ORDER — CYANOCOBALAMIN 1000 MCG/ML IJ SOLN
1000.0000 ug | Freq: Once | INTRAMUSCULAR | Status: AC
  Administered 2016-06-01: 1000 ug via INTRAMUSCULAR

## 2016-06-01 MED ORDER — INSULIN PEN NEEDLE 32G X 6 MM MISC: 1.0000 | 5 refills | Status: DC

## 2016-06-01 NOTE — Telephone Encounter (Signed)
Patient states she needs new needles for her Saxenda due to the [place] and they hurt. But what came in the samples were Novofine 32 G tip, please change and send into pharmacy. Thanks

## 2016-06-01 NOTE — Progress Notes (Signed)
poc

## 2016-06-24 ENCOUNTER — Encounter: Payer: 59 | Admitting: Family Medicine

## 2016-07-07 ENCOUNTER — Encounter: Payer: Self-pay | Admitting: Family Medicine

## 2016-07-07 ENCOUNTER — Ambulatory Visit (INDEPENDENT_AMBULATORY_CARE_PROVIDER_SITE_OTHER): Payer: 59 | Admitting: Family Medicine

## 2016-07-07 VITALS — BP 118/82 | HR 96 | Temp 99.4°F | Resp 16 | Ht 65.0 in | Wt 194.4 lb

## 2016-07-07 DIAGNOSIS — D508 Other iron deficiency anemias: Secondary | ICD-10-CM | POA: Diagnosis not present

## 2016-07-07 DIAGNOSIS — A09 Infectious gastroenteritis and colitis, unspecified: Secondary | ICD-10-CM | POA: Diagnosis not present

## 2016-07-07 DIAGNOSIS — E785 Hyperlipidemia, unspecified: Secondary | ICD-10-CM | POA: Diagnosis not present

## 2016-07-07 DIAGNOSIS — R739 Hyperglycemia, unspecified: Secondary | ICD-10-CM | POA: Diagnosis not present

## 2016-07-07 DIAGNOSIS — R110 Nausea: Secondary | ICD-10-CM

## 2016-07-07 DIAGNOSIS — E559 Vitamin D deficiency, unspecified: Secondary | ICD-10-CM

## 2016-07-07 DIAGNOSIS — R51 Headache: Secondary | ICD-10-CM | POA: Diagnosis not present

## 2016-07-07 DIAGNOSIS — E538 Deficiency of other specified B group vitamins: Secondary | ICD-10-CM | POA: Diagnosis not present

## 2016-07-07 DIAGNOSIS — Z114 Encounter for screening for human immunodeficiency virus [HIV]: Secondary | ICD-10-CM

## 2016-07-07 DIAGNOSIS — E669 Obesity, unspecified: Secondary | ICD-10-CM

## 2016-07-07 DIAGNOSIS — R197 Diarrhea, unspecified: Secondary | ICD-10-CM

## 2016-07-07 DIAGNOSIS — R519 Headache, unspecified: Secondary | ICD-10-CM

## 2016-07-07 NOTE — Progress Notes (Signed)
Name: Sharon Herring   MRN: [id number]    DOB: 1976/03/30   Date:07/07/2016       Progress Note  Subjective  Chief Complaint  Chief Complaint  Patient presents with  . Nausea    pt has had nausea and diarrhea for 3 days mostly in the morning  . Diarrhea    HPI  Mild gastroenteritis: she has noticed loose stools multiple times in am's for the past few days, no blood or mucus. No change in medication and no recent antibiotic use. She went to a birthday celebration on Saturday and symptoms started 2 days later, but nobody else seems to have same symptoms. She noticed nausea last night, and this morning to woke up with headache, dull and frontal. She has not been drinking as much water lately. Took a Tylenol and pain has improved. She denies fever or chills  Obesity: she has lost 23 lbs since she started on Saxenda on 03/28/2016, medication helps curb her appetite, she is not exercising as often but will try to start. She does not have any side effects now, only when she first started medication  Vitamin D and B12: she finished rx vitamin D, and has not started otc supplementation yet, taking B12 supplements otc  Iron deficiency: she states since fibroid removed last year she is no longer bleeding as heavy, however still had anemia back in the summer 2017, not on supplements at this time, she has received iron infusion in the past, she feels tired, but not very.   Dyslipidemia: HDL was low on her last labs, she is trying to eat more fish, has not increased tree nut or exericise yet    Patient Active Problem List   Diagnosis Date Noted  . B12 deficiency 03/28/2016  . Vitamin D deficiency 03/28/2016  . Hyperglycemia 03/28/2016  . Multiple gastric polyps 01/08/2016  . Menorrhagia 11/13/2015  . Chronic constipation 09/23/2015  . Obesity, Class I, BMI 30.0-34.9 (see actual BMI) 06/02/2015  . Iron deficiency anemia 03/14/2014  . History of uterine fibroid 03/14/2014    Past Surgical  History:  Procedure Laterality Date  . APPENDECTOMY  2012  . ESOPHAGOGASTRODUODENOSCOPY (EGD) WITH PROPOFOL N/A 01/06/2016   Procedure: ESOPHAGOGASTRODUODENOSCOPY (EGD) WITH PROPOFOL;  Surgeon: Robert Bellow, MD;  Location: [facility] END[facility];  Service: Endoscopy;  Laterality: N/A;  . ROBOTIC ASSISTED LAPAROSCOPIC CHOLECYSTECTOMY N/A 03/03/2016   Procedure: ROBOTIC ASSISTED LAPAROSCOPIC CHOLECYSTECTOMY ;  Surgeon: Clayburn Pert, MD;  Location: [facility] ORS;  Service: General;  Laterality: N/A;  . UTERINE FIBROID SURGERY  2012    Family History  Problem Relation Age of Onset  . Hypertension Mother   . Hypertension Father      Social History   Social History  . Marital status: Single    Spouse name: N/A  . Number of children: N/A  . Years of education: N/A   Occupational History  . Not on file.   Social History Main Topics  . Smoking status: Never Smoker  . Smokeless tobacco: Never Used  . Alcohol use No  . Drug use: No  . Sexual activity: Not Currently   Other Topics Concern  . Not on file   Social History Narrative  . No narrative on file     Current Outpatient Prescriptions:  .  docusate sodium (COLACE) 100 MG capsule, Take 1 capsule (100 mg total) by mouth daily. (Patient taking differently: Take 100 mg by mouth daily as needed for mild constipation. ), Disp: 90 capsule, Rfl:  3 .  Insulin Pen Needle (NOVOFINE) 32G X 6 MM MISC, 1 each by Does not apply route every morning., Disp: 100 each, Rfl: 5 .  linaclotide (LINZESS) 145 MCG CAPS capsule, Take 1 capsule (145 mcg total) by mouth daily before breakfast. (Patient taking differently: Take 145 mcg by mouth as needed. ), Disp: 30 capsule, Rfl: 2 .  Liraglutide -Weight Management (SAXENDA) 18 MG/3ML SOPN, Inject 3 mg into the skin daily., Disp: 5 pen, Rfl: 2 .  naproxen (NAPROSYN) 500 MG tablet, Take 1 po BID with food prn pain, Disp: 30 tablet, Rfl: 0 .  omeprazole (PRILOSEC) 40 MG capsule, Take 1 capsule (40 mg total) by  mouth daily. (Patient not taking: Reported on 04/11/2016), Disp: 30 capsule, Rfl: 1 .  ondansetron (ZOFRAN-ODT) 4 MG disintegrating tablet, Take 1 tablet (4 mg total) by mouth every 8 (eight) hours as needed for nausea or vomiting., Disp: 20 tablet, Rfl: 0  Allergies  Allergen Reactions  . Aspirin Nausea And Vomiting  . Oxycodone-Acetaminophen Rash and Shortness Of Breath  . Tape Other (See Comments)    Burning feeling. Has left mark on skin.  Melynda Keller [Lorcaserin Hcl] Other (See Comments)    headache  . Contrave [Naltrexone-Bupropion Hcl Er]   . Dicyclomine Nausea And Vomiting  . Norethindrone Rash     ROS  Constitutional: Negative for fever, positive for weight change.  Respiratory: Negative for cough and shortness of breath.   Cardiovascular: Negative for chest pain or palpitations.  Gastrointestinal: Negative for abdominal pain, no bowel changes.  Musculoskeletal: Negative for gait problem or joint swelling.  Skin: Negative for rash.  Neurological: Negative for dizziness or headache.  No other specific complaints in a complete review of systems (except as listed in HPI above).  Objective  Vitals:   07/07/16 0836  BP: 118/82  Pulse: 96  Resp: 16  Temp: 99.4 F (37.4 C)  TempSrc: Oral  SpO2: 99%  Weight: 194 lb 6 oz (88.2 kg)  Height: 5\' 5"  (1.651 m)    Body mass index is 32.35 kg/m.  Physical Exam  Constitutional: Patient appears well-developed and well-nourished. Obese  No distress.  HEENT: head atraumatic, normocephalic, pupils equal and reactive to light,  neck supple, throat within normal limits Cardiovascular: Normal rate, regular rhythm and normal heart sounds.  No murmur heard. No BLE edema. Pulmonary/Chest: Effort normal and breath sounds normal. No respiratory distress. Abdominal: Soft.  There is no tenderness. Normal bowel sounds, no pain during palpation Psychiatric: Patient has a normal mood and affect. behavior is normal. Judgment and thought  content normal.  PHQ2/9: Depression screen [facility] 2/9 03/28/2016 12/16/2015 11/13/2015 06/02/2015 05/12/2015  Decreased Interest 0 0 0 0 0  Down, Depressed, Hopeless 0 0 0 0 0  PHQ - 2 Score 0 0 0 0 0     Fall Risk: Fall Risk  03/28/2016 12/16/2015 11/13/2015 06/02/2015 05/12/2015  Falls in the past year? No No No No No      Assessment & Plan  1. Diarrhea of presumed infectious origin  - COMPLETE METABOLIC PANEL WITH GFR  2. Nausea  Likely from gastroenteritis  3. Mild headache  - COMPLETE METABOLIC PANEL WITH GFR  4. Vitamin D deficiency  - VITAMIN D 25 Hydroxy (Vit-D Deficiency, Fractures)  5. B12 deficiency  - Vitamin B12  6. Hyperglycemia  - Lipid panel - Hemoglobin A1c  7. Other iron deficiency anemia  - CBC with Differential/Platelet - Iron, TIBC and Ferritin Panel  8. Obesity,  Class I, BMI 30.0-34.9 (see actual BMI)  Continue Saxenda  9. Dyslipidemia  Discussed importance of 150 minutes of physical activity weekly, eat two servings of fish weekly, eat one serving of tree nuts ( cashews, pistachios, pecans, almonds.Marland Kitchen) every other day, eat 6 servings of fruit/vegetables daily and drink plenty of water and avoid sweet beverages.   10. Encounter for screening for HIV  - HIV antibody

## 2016-07-18 ENCOUNTER — Ambulatory Visit (INDEPENDENT_AMBULATORY_CARE_PROVIDER_SITE_OTHER): Payer: 59 | Admitting: Family Medicine

## 2016-07-18 ENCOUNTER — Encounter: Payer: Self-pay | Admitting: Family Medicine

## 2016-07-18 ENCOUNTER — Other Ambulatory Visit: Payer: Self-pay | Admitting: Family Medicine

## 2016-07-18 VITALS — BP 118/78 | HR 82 | Temp 98.6°F | Resp 16 | Ht 65.0 in | Wt 175.2 lb

## 2016-07-18 DIAGNOSIS — Z124 Encounter for screening for malignant neoplasm of cervix: Secondary | ICD-10-CM | POA: Diagnosis not present

## 2016-07-18 DIAGNOSIS — E669 Obesity, unspecified: Secondary | ICD-10-CM

## 2016-07-18 DIAGNOSIS — Z6829 Body mass index (BMI) 29.0-29.9, adult: Secondary | ICD-10-CM

## 2016-07-18 DIAGNOSIS — Z01419 Encounter for gynecological examination (general) (routine) without abnormal findings: Secondary | ICD-10-CM | POA: Diagnosis not present

## 2016-07-18 NOTE — Progress Notes (Signed)
Name: Sharon Herring   MRN: [id number]    DOB: 1976-06-03   Date:07/18/2016       Progress Note  Subjective  Chief Complaint  Chief Complaint  Patient presents with  . Annual Exam    HPI  Well Woman: she has been sexually since last pap smear, not sexually active since 08/2015. No vaginal discharge. No urinary symptoms.   Obesity: she has lost 42 lbs since 03/2016 when we started her on Saxenda. She states it curbs her appetite. She has also changed her diet, eating healthier. She is walking daily for 30 minutes.   Menorrhagia: she has uterine fibroids removed, he still has heavy cycles, lasts about 1 week, regular, about every 28 days. Not taking ocp. She was not able to tolerate ocp, she broke out in a rash and does not want to try anything else at this time  Patient Active Problem List   Diagnosis Date Noted  . B12 deficiency 03/28/2016  . Vitamin D deficiency 03/28/2016  . Hyperglycemia 03/28/2016  . Multiple gastric polyps 01/08/2016  . Menorrhagia 11/13/2015  . Chronic constipation 09/23/2015  . Obesity, Class I, BMI 30.0-34.9 (see actual BMI) 06/02/2015  . Iron deficiency anemia 03/14/2014  . History of uterine fibroid 03/14/2014    Past Surgical History:  Procedure Laterality Date  . APPENDECTOMY  2012  . ESOPHAGOGASTRODUODENOSCOPY (EGD) WITH PROPOFOL N/A 01/06/2016   Procedure: ESOPHAGOGASTRODUODENOSCOPY (EGD) WITH PROPOFOL;  Surgeon: Robert Bellow, MD;  Location: [facility];  Service: Endoscopy;  Laterality: N/A;  . ROBOTIC ASSISTED LAPAROSCOPIC CHOLECYSTECTOMY N/A 03/03/2016   Procedure: ROBOTIC ASSISTED LAPAROSCOPIC CHOLECYSTECTOMY ;  Surgeon: Clayburn Pert, MD;  Location: [facility] OR[facility];  Service: General;  Laterality: N/A;  . UTERINE FIBROID SURGERY  2012    Family History  Problem Relation Age of Onset  . Hypertension Mother   . Hypertension Father     Social History   Social History  . Marital status: Single    Spouse name: N/A  . Number of  children: N/A  . Years of education: N/A   Occupational History  . Not on file.   Social History Main Topics  . Smoking status: Never Smoker  . Smokeless tobacco: Never Used  . Alcohol use No  . Drug use: No  . Sexual activity: Not Currently   Other Topics Concern  . Not on file   Social History Narrative  . No narrative on file     Current Outpatient Prescriptions:  .  docusate sodium (COLACE) 100 MG capsule, Take 1 capsule (100 mg total) by mouth daily. (Patient taking differently: Take 100 mg by mouth daily as needed for mild constipation. ), Disp: 90 capsule, Rfl: 3 .  Insulin Pen Needle (NOVOFINE) 32G X 6 MM MISC, 1 each by Does not apply route every morning., Disp: 100 each, Rfl: 5 .  linaclotide (LINZESS) 145 MCG CAPS capsule, Take 1 capsule (145 mcg total) by mouth daily before breakfast. (Patient taking differently: Take 145 mcg by mouth as needed. ), Disp: 30 capsule, Rfl: 2 .  Liraglutide -Weight Management (SAXENDA) 18 MG/3ML SOPN, Inject 3 mg into the skin daily., Disp: 5 pen, Rfl: 2 .  naproxen (NAPROSYN) 500 MG tablet, Take 1 po BID with food prn pain, Disp: 30 tablet, Rfl: 0 .  omeprazole (PRILOSEC) 40 MG capsule, Take 1 capsule (40 mg total) by mouth daily. (Patient not taking: Reported on 04/11/2016), Disp: 30 capsule, Rfl: 1 .  ondansetron (ZOFRAN-ODT) 4 MG disintegrating tablet, Take  1 tablet (4 mg total) by mouth every 8 (eight) hours as needed for nausea or vomiting., Disp: 20 tablet, Rfl: 0  Allergies  Allergen Reactions  . Aspirin Nausea And Vomiting  . Oxycodone-Acetaminophen Rash and Shortness Of Breath  . Tape Other (See Comments)    Burning feeling. Has left mark on skin.  Melynda Keller [Lorcaserin Hcl] Other (See Comments)    headache  . Contrave [Naltrexone-Bupropion Hcl Er]   . Dicyclomine Nausea And Vomiting  . Norethindrone Rash     ROS  Constitutional: Negative for fever, positive for  weight change.  Respiratory: Negative for cough and  shortness of breath.   Cardiovascular: Negative for chest pain or palpitations.  Gastrointestinal: Negative for abdominal pain, no bowel changes.  Musculoskeletal: Negative for gait problem or joint swelling.  Skin: Negative for rash.  Neurological: Negative for dizziness or headache.  No other specific complaints in a complete review of systems (except as listed in HPI above).  Objective  Vitals:   07/18/16 1105  BP: 118/78  Pulse: 82  Resp: 16  Temp: 98.6 F (37 C)  SpO2: 98%  Weight: 175 lb 3 oz (79.5 kg)  Height: 5\' 5"  (1.651 m)    Body mass index is 29.15 kg/m.  Physical Exam  Constitutional: Patient appears well-developed and well-nourished. No distress.  HENT: Head: Normocephalic and atraumatic. Ears: B TMs ok, no erythema or effusion; Nose: Nose normal. Mouth/Throat: Oropharynx is clear and moist. No oropharyngeal exudate.  Eyes: Conjunctivae and EOM are normal. Pupils are equal, round, and reactive to light. No scleral icterus.  Neck: Normal range of motion. Neck supple. No JVD present. No thyromegaly present.  Cardiovascular: Normal rate, regular rhythm and normal heart sounds.  No murmur heard. No BLE edema. Pulmonary/Chest: Effort normal and breath sounds normal. No respiratory distress. Abdominal: Soft. Bowel sounds are normal, no distension. There is no tenderness. no masses Breast: no lumps or masses, no nipple discharge or rashes FEMALE GENITALIA:  External genitalia normal External urethra normal Vaginal vault normal without discharge or lesions Cervix normal without discharge or lesions Bimanual exam normal without masses RECTAL: no rectal masses or hemorrhoids Musculoskeletal: Normal range of motion, no joint effusions. No gross deformities Neurological: he is alert and oriented to person, place, and time. No cranial nerve deficit. Coordination, balance, strength, speech and gait are normal.  Skin: Skin is warm and dry. No rash noted. No erythema.   Psychiatric: Patient has a normal mood and affect. behavior is normal. Judgment and thought content normal.  PHQ2/9: Depression screen [facility] 2/9 03/28/2016 12/16/2015 11/13/2015 06/02/2015 05/12/2015  Decreased Interest 0 0 0 0 0  Down, Depressed, Hopeless 0 0 0 0 0  PHQ - 2 Score 0 0 0 0 0    Fall Risk: Fall Risk  03/28/2016 12/16/2015 11/13/2015 06/02/2015 05/12/2015  Falls in the past year? No No No No No     Functional Status Survey: Is the patient deaf or have difficulty hearing?: No Does the patient have difficulty seeing, even when wearing glasses/contacts?: No Does the patient have difficulty concentrating, remembering, or making decisions?: No Does the patient have difficulty walking or climbing stairs?: No Does the patient have difficulty dressing or bathing?: No Does the patient have difficulty doing errands alone such as visiting a doctor's office or shopping?: No   Assessment & Plan  1. Well woman exam  Discussed importance of 150 minutes of physical activity weekly, eat two servings of fish weekly, eat one serving of  tree nuts ( cashews, pistachios, pecans, almonds.Marland Kitchen) every other day, eat 6 servings of fruit/vegetables daily and drink plenty of water and avoid sweet beverages.   2. Obesity, Class I, BMI 30.0-34.9 (see actual BMI)  Discussed with the patient the risk posed by an increased BMI. Discussed importance of portion control, calorie counting and at least 150 minutes of physical activity weekly. Avoid sweet beverages and drink more water. Eat at least 6 servings of fruit and vegetables daily   3. Cervical cancer screening  - Pap,SurePath with HPV

## 2016-07-19 ENCOUNTER — Other Ambulatory Visit: Payer: Self-pay | Admitting: Emergency Medicine

## 2016-07-19 MED ORDER — AMITRIPTYLINE HCL 25 MG PO TABS: 25.0000 mg | ORAL_TABLET | Freq: Every day | ORAL | 0 refills | Status: DC

## 2016-07-19 NOTE — Progress Notes (Unsigned)
aamitr

## 2016-07-20 LAB — PAP IG AND HPV HIGH-RISK: HPV DNA High Risk: NOT DETECTED

## 2016-07-21 DIAGNOSIS — D508 Other iron deficiency anemias: Secondary | ICD-10-CM | POA: Diagnosis not present

## 2016-07-21 DIAGNOSIS — A09 Infectious gastroenteritis and colitis, unspecified: Secondary | ICD-10-CM | POA: Diagnosis not present

## 2016-07-21 DIAGNOSIS — E538 Deficiency of other specified B group vitamins: Secondary | ICD-10-CM | POA: Diagnosis not present

## 2016-07-21 DIAGNOSIS — E559 Vitamin D deficiency, unspecified: Secondary | ICD-10-CM | POA: Diagnosis not present

## 2016-07-21 DIAGNOSIS — Z114 Encounter for screening for human immunodeficiency virus [HIV]: Secondary | ICD-10-CM | POA: Diagnosis not present

## 2016-07-21 DIAGNOSIS — R51 Headache: Secondary | ICD-10-CM | POA: Diagnosis not present

## 2016-07-21 DIAGNOSIS — R739 Hyperglycemia, unspecified: Secondary | ICD-10-CM | POA: Diagnosis not present

## 2016-07-21 LAB — CBC WITH DIFFERENTIAL/PLATELET
BASOS ABS: 0 {cells}/uL (ref 0–200)
Basophils Relative: 0 %
EOS PCT: 1 %
Eosinophils Absolute: 53 cells/uL (ref 15–500)
HEMATOCRIT: 30.9 % — AB (ref 35.0–45.0)
HEMOGLOBIN: 9.3 g/dL — AB (ref 11.7–15.5)
LYMPHS ABS: 2120 {cells}/uL (ref 850–3900)
Lymphocytes Relative: 40 %
MCH: 22.1 pg — AB (ref 27.0–33.0)
MCHC: 30.1 g/dL — AB (ref 32.0–36.0)
MCV: 73.4 fL — ABNORMAL LOW (ref 80.0–100.0)
MPV: 9 fL (ref 7.5–12.5)
Monocytes Absolute: 477 cells/uL (ref 200–950)
Monocytes Relative: 9 %
NEUTROS PCT: 50 %
Neutro Abs: 2650 cells/uL (ref 1500–7800)
Platelets: 675 10*3/uL — ABNORMAL HIGH (ref 140–400)
RBC: 4.21 MIL/uL (ref 3.80–5.10)
RDW: 17.4 % — ABNORMAL HIGH (ref 11.0–15.0)
WBC: 5.3 10*3/uL (ref 3.8–10.8)

## 2016-07-22 ENCOUNTER — Other Ambulatory Visit: Payer: Self-pay | Admitting: Family Medicine

## 2016-07-22 DIAGNOSIS — D508 Other iron deficiency anemias: Secondary | ICD-10-CM

## 2016-07-22 LAB — LIPID PANEL
CHOL/HDL RATIO: 4.5 ratio (ref <5.0)
CHOLESTEROL: 177 mg/dL (ref <200)
HDL: 39 mg/dL — ABNORMAL LOW (ref >50)
LDL Cholesterol: 128 mg/dL — ABNORMAL HIGH (ref <100)
TRIGLYCERIDES: 52 mg/dL (ref <150)
VLDL: 10 mg/dL (ref <30)

## 2016-07-22 LAB — COMPLETE METABOLIC PANEL WITHOUT GFR
ALT: 12 U/L (ref 6–29)
AST: 13 U/L (ref 10–30)
Albumin: 3.7 g/dL (ref 3.6–5.1)
Alkaline Phosphatase: 72 U/L (ref 33–115)
BUN: 10 mg/dL (ref 7–25)
CO2: 27 mmol/L (ref 20–31)
Calcium: 9.1 mg/dL (ref 8.6–10.2)
Chloride: 104 mmol/L (ref 98–110)
Creat: 0.7 mg/dL (ref 0.50–1.10)
GFR, Est African American: >89 mL/min
GFR, Est Non African American: >89 mL/min
Glucose, Bld: 108 mg/dL — ABNORMAL HIGH (ref 65–99)
Potassium: 4.8 mmol/L (ref 3.5–5.3)
Sodium: 139 mmol/L (ref 135–146)
Total Bilirubin: 0.2 mg/dL (ref 0.2–1.2)
Total Protein: 7 g/dL (ref 6.1–8.1)

## 2016-07-22 LAB — IRON,TIBC AND FERRITIN PANEL
%SAT: 6 % — ABNORMAL LOW (ref 11–50)
Ferritin: 6 ng/mL — ABNORMAL LOW (ref 10–154)
Iron: 26 ug/dL — ABNORMAL LOW (ref 40–190)
TIBC: 440 ug/dL (ref 250–450)

## 2016-07-22 LAB — HEMOGLOBIN A1C
Hgb A1c MFr Bld: 5.9 % — ABNORMAL HIGH (ref <5.7)
Mean Plasma Glucose: 123 mg/dL

## 2016-07-22 LAB — VITAMIN D 25 HYDROXY (VIT D DEFICIENCY, FRACTURES): VIT D 25 HYDROXY: 12 ng/mL — AB (ref 30–100)

## 2016-07-22 LAB — VITAMIN B12: Vitamin B-12: 462 pg/mL (ref 200–1100)

## 2016-07-22 LAB — HIV ANTIBODY (ROUTINE TESTING W REFLEX): HIV 1&2 Ab, 4th Generation: NONREACTIVE

## 2016-07-27 ENCOUNTER — Other Ambulatory Visit: Payer: Self-pay | Admitting: Family Medicine

## 2016-07-27 DIAGNOSIS — R42 Dizziness and giddiness: Secondary | ICD-10-CM

## 2016-07-27 DIAGNOSIS — R2681 Unsteadiness on feet: Secondary | ICD-10-CM

## 2016-07-29 ENCOUNTER — Telehealth: Payer: Self-pay | Admitting: *Deleted

## 2016-07-29 DIAGNOSIS — R42 Dizziness and giddiness: Secondary | ICD-10-CM | POA: Diagnosis not present

## 2016-07-29 NOTE — Telephone Encounter (Signed)
Message left for patient that the appt for next wed 12-13 was the earliest appt, and if she had questions to call [phone]

## 2016-08-01 NOTE — Telephone Encounter (Signed)
Pt called back on Friday 12/8 and said that 12/13 does not work for her.  Changed appt 12/15 and she was agreeable to the new appt given to work out for her schedule

## 2016-08-03 ENCOUNTER — Ambulatory Visit: Payer: 59 | Admitting: Hematology and Oncology

## 2016-08-05 ENCOUNTER — Inpatient Hospital Stay: Payer: 59 | Attending: Hematology and Oncology | Admitting: Hematology and Oncology

## 2016-08-05 NOTE — Progress Notes (Deleted)
[facility] day:  08/05/2016  Chief Complaint: QUANDRA MOLINELLI is a 40 y.o. female with anemia who is referred in consultation by Dr. Ancil Boozer for assessment and management.  HPI: ***  she has lost 42 lbs since 03/2016 when we started her on Saxenda. She states it curbs her appetite. She has also changed her diet, eating healthier. She is walking daily for 30 minutes.   she has uterine fibroids removed, he still has heavy cycles, lasts about 1 week, regular, about every 28 days. Not taking ocp. She was not able to tolerate ocp  Labs on 07/21/2016 included a hematocrit of 30.9, hemoglobin 9.3, MCV 73.4, platelets 675,000, white count 5300 with an [place] of 2650. If her and she was unremarkable. [place] metabolic panel included a creatinine of 0.7 irritable function tests were normal.  Iron studies included a ferritin of 6, iron saturation 6% and TIBC of 440. B12 was 462.  HIV testing was negative.   Past Medical History:  Diagnosis Date  . Anemia   . Fibroid uterus   . GERD (gastroesophageal reflux disease)   . IBS (irritable bowel syndrome)   . Polyp of stomach 01-06-16   Stomach polyp, pyloric    Past Surgical History:  Procedure Laterality Date  . APPENDECTOMY  2012  . ESOPHAGOGASTRODUODENOSCOPY (EGD) WITH PROPOFOL N/A 01/06/2016   Procedure: ESOPHAGOGASTRODUODENOSCOPY (EGD) WITH PROPOFOL;  Surgeon: Robert Bellow, MD;  Location: [facility];  Service: Endoscopy;  Laterality: N/A;  . ROBOTIC ASSISTED LAPAROSCOPIC CHOLECYSTECTOMY N/A 03/03/2016   Procedure: ROBOTIC ASSISTED LAPAROSCOPIC CHOLECYSTECTOMY ;  Surgeon: Clayburn Pert, MD;  Location: [facility];  Service: General;  Laterality: N/A;  . UTERINE FIBROID SURGERY  2012    Family History  Problem Relation Age of Onset  . Hypertension Mother   . Hypertension Father     Social History:  reports that she has never smoked. She has never used smokeless tobacco. She reports that she  does not drink alcohol or use drugs.  The patient is accompanied by *** alone today.  Allergies:  Allergies  Allergen Reactions  . Aspirin Nausea And Vomiting  . Oxycodone-Acetaminophen Rash and Shortness Of Breath  . Tape Other (See Comments)    Burning feeling. Has left mark on skin.  Melynda Keller [Lorcaserin Hcl] Other (See Comments)    headache  . Contrave [Naltrexone-Bupropion Hcl Er]   . Dicyclomine Nausea And Vomiting  . Norethindrone Rash    Current Medications: Current Outpatient Prescriptions  Medication Sig Dispense Refill  . amitriptyline (ELAVIL) 25 MG tablet Take 1 tablet (25 mg total) by mouth at bedtime. 30 tablet 0  . docusate sodium (COLACE) 100 MG capsule Take 1 capsule (100 mg total) by mouth daily. (Patient taking differently: Take 100 mg by mouth daily as needed for mild constipation. ) 90 capsule 3  . Insulin Pen Needle (NOVOFINE) 32G X 6 MM MISC 1 each by Does not apply route every morning. 100 each 5  . linaclotide (LINZESS) 145 MCG CAPS capsule Take 1 capsule (145 mcg total) by mouth daily before breakfast. (Patient taking differently: Take 145 mcg by mouth as needed. ) 30 capsule 2  . Liraglutide -Weight Management (SAXENDA) 18 MG/3ML SOPN Inject 3 mg into the skin daily. 5 pen 2  . naproxen (NAPROSYN) 500 MG tablet Take 1 po BID with food prn pain 30 tablet 0  . omeprazole (PRILOSEC) 40 MG capsule Take 1 capsule (40 mg total) by mouth daily. (  Patient not taking: Reported on 04/11/2016) 30 capsule 1  . ondansetron (ZOFRAN-ODT) 4 MG disintegrating tablet Take 1 tablet (4 mg total) by mouth every 8 (eight) hours as needed for nausea or vomiting. 20 tablet 0   No current facility-administered medications for this visit.     Review of Systems:  GENERAL:  Feels good.  Active.  No fevers, sweats or weight loss. PERFORMANCE STATUS (ECOG):  *** HEENT:  No visual changes, runny nose, sore throat, mouth sores or tenderness. Lungs: No shortness of breath or cough.  No  hemoptysis. Cardiac:  No chest pain, palpitations, orthopnea, or PND. GI:  No nausea, vomiting, diarrhea, constipation, melena or hematochezia. GU:  No urgency, frequency, dysuria, or hematuria. Musculoskeletal:  No back pain.  No joint pain.  No muscle tenderness. Extremities:  No pain or swelling. Skin:  No rashes or skin changes. Neuro:  No headache, numbness or weakness, balance or coordination issues. Endocrine:  No diabetes, thyroid issues, hot flashes or night sweats. Psych:  No mood changes, depression or anxiety. Pain:  No focal pain. Review of systems:  All other systems reviewed and found to be negative.   Physical Exam: There were no vitals taken for this visit. GENERAL:  Well developed, well nourished, sitting comfortably in the exam room in no acute distress. MENTAL STATUS:  Alert and oriented to person, place and time. HEAD:  *** hair.  Normocephalic, atraumatic, face symmetric, no Cushingoid features. EYES:  *** eyes.  Pupils equal round and reactive to light and accomodation.  No conjunctivitis or scleral icterus. ENT:  Oropharynx clear without lesion.  Tongue normal. Mucous membranes moist.  RESPIRATORY:  Clear to auscultation without rales, wheezes or rhonchi. CARDIOVASCULAR:  Regular rate and rhythm without murmur, rub or gallop. ABDOMEN:  Soft, non-tender, with active bowel sounds, and no hepatosplenomegaly.  No masses. SKIN:  No rashes, ulcers or lesions. EXTREMITIES: No edema, no skin discoloration or tenderness.  No palpable cords. LYMPH NODES: No palpable cervical, supraclavicular, axillary or inguinal adenopathy  NEUROLOGICAL: Unremarkable. PSYCH:  Appropriate.  No visits with results within 3 Day(s) from this visit.  Latest known visit with results is:  Orders Only on 07/18/2016  Component Date Value Ref Range Status  . HPV DNA High Risk 07/20/2016 Not Detected   Final   Comment: HIGH RISK HPV types (16,18,31,33,35,39,45,51,52,56,58,59,66,68) were not  detected. Other HPV types which cause anogenital lesions may be present. The significance of the other types of HPV in malignant  processes has not been established.                  ** Normal Reference Range: Not Detected **      HPV High Risk testing performed using the APTIMA HPV mRNA Assay.     Marland Kitchen Specimen adequacy: 07/20/2016    Final   SATISFACTORY.  Endocervical/transformation zone component present.  Marland Kitchen FINAL DIAGNOSIS: 07/20/2016    Final   Comment: - NEGATIVE FOR INTRAEPITHELIAL LESIONS OR MALIGNANCY.   Marland Kitchen COMMENTS: 07/20/2016    Final   Comment: LMP (Last Menstrual Period) of the patient is not given. This Pap test has been evaluated with computer assisted technology.   . Cytotechnologist: 07/20/2016    Final   Comment: JWW, BS CT(ASCP), MLT(ASCP) *  The Pap is a screening test for cervical cancer. It is not a  diagnostic test and is subject to false negative and false positive  results. It is most reliable when a satisfactory sample, regularly  obtained, is  submitted with relevant clinical findings and history,  and when the Pap result is evaluated along with historic and current  clinical information.     Assessment:  DAFFNE HAUGHNEY is a 40 y.o. female ***  Plan: 1. *** 2. *** 3. *** 4. *** 5. ***  Lequita Asal, MD  08/05/2016, 4:58 AM

## 2016-08-09 ENCOUNTER — Telehealth: Payer: Self-pay | Admitting: Hematology and Oncology

## 2016-08-24 ENCOUNTER — Other Ambulatory Visit: Payer: Self-pay | Admitting: Emergency Medicine

## 2016-08-24 MED ORDER — CIPROFLOXACIN HCL 250 MG PO TABS: 250.0000 mg | ORAL_TABLET | Freq: Two times a day (BID) | ORAL | 0 refills | Status: DC

## 2016-08-24 MED ORDER — OSELTAMIVIR PHOSPHATE 75 MG PO CAPS: 75.0000 mg | ORAL_CAPSULE | Freq: Every day | ORAL | 0 refills | Status: DC

## 2016-08-25 ENCOUNTER — Inpatient Hospital Stay: Payer: 59

## 2016-08-25 ENCOUNTER — Encounter: Payer: Self-pay | Admitting: Hematology and Oncology

## 2016-08-25 ENCOUNTER — Inpatient Hospital Stay: Payer: 59 | Attending: Hematology and Oncology | Admitting: Hematology and Oncology

## 2016-08-25 VITALS — BP 126/83 | HR 82 | Temp 95.3°F | Ht 65.0 in | Wt 205.9 lb

## 2016-08-25 DIAGNOSIS — D509 Iron deficiency anemia, unspecified: Secondary | ICD-10-CM

## 2016-08-25 DIAGNOSIS — K219 Gastro-esophageal reflux disease without esophagitis: Secondary | ICD-10-CM | POA: Insufficient documentation

## 2016-08-25 DIAGNOSIS — D500 Iron deficiency anemia secondary to blood loss (chronic): Secondary | ICD-10-CM | POA: Insufficient documentation

## 2016-08-25 DIAGNOSIS — E538 Deficiency of other specified B group vitamins: Secondary | ICD-10-CM | POA: Insufficient documentation

## 2016-08-25 DIAGNOSIS — K589 Irritable bowel syndrome without diarrhea: Secondary | ICD-10-CM | POA: Insufficient documentation

## 2016-08-25 DIAGNOSIS — N920 Excessive and frequent menstruation with regular cycle: Secondary | ICD-10-CM | POA: Diagnosis not present

## 2016-08-25 DIAGNOSIS — Z79899 Other long term (current) drug therapy: Secondary | ICD-10-CM | POA: Diagnosis not present

## 2016-08-25 DIAGNOSIS — R5383 Other fatigue: Secondary | ICD-10-CM | POA: Insufficient documentation

## 2016-08-25 LAB — CBC WITH DIFFERENTIAL/PLATELET
Basophils Absolute: 0 10*3/uL (ref 0–0.1)
Basophils Relative: 1 %
Eosinophils Absolute: 0.1 10*3/uL (ref 0–0.7)
Eosinophils Relative: 1 %
HCT: 28.3 % — ABNORMAL LOW (ref 35.0–47.0)
Hemoglobin: 9.2 g/dL — ABNORMAL LOW (ref 12.0–16.0)
Lymphocytes Relative: 40 %
Lymphs Abs: 2.4 10*3/uL (ref 1.0–3.6)
MCH: 22.6 pg — ABNORMAL LOW (ref 26.0–34.0)
MCHC: 32.5 g/dL (ref 32.0–36.0)
MCV: 69.6 fL — ABNORMAL LOW (ref 80.0–100.0)
Monocytes Absolute: 0.6 10*3/uL (ref 0.2–0.9)
Monocytes Relative: 10 %
Neutro Abs: 2.8 10*3/uL (ref 1.4–6.5)
Neutrophils Relative %: 48 %
Platelets: 533 10*3/uL — ABNORMAL HIGH (ref 150–440)
RBC: 4.07 MIL/uL (ref 3.80–5.20)
RDW: 17.3 % — ABNORMAL HIGH (ref 11.5–14.5)
WBC: 6 10*3/uL (ref 3.6–11.0)

## 2016-08-25 LAB — RETICULOCYTES
RBC.: 4.07 MIL/uL (ref 3.80–5.20)
Retic Count, Absolute: 77.3 10*3/uL (ref 19.0–183.0)
Retic Ct Pct: 1.9 % (ref 0.4–3.1)

## 2016-08-25 LAB — FERRITIN: Ferritin: 5 ng/mL — ABNORMAL LOW (ref 11–307)

## 2016-08-25 NOTE — Progress Notes (Signed)
[facility] day:  08/25/2016  Chief Complaint: Sharon Herring is a 41 y.o. female with anemia who is referred in consultation by Dr. Ancil Boozer for assessment and management.  HPI: The patient notes a history of on and off anemia for several years.  She notes that her diet is fairly good.  She eats grilled chicken, salmon and green vegetables.  She denies any melena, hematochezia, or hematuria.  She notes ice pica x 2 months.  She has been on oral iron 1 pill a day x 1 month.  She notes heavy menses for years.  Menses initially improved after fibroid resection in 2011.  Menses typically lasts 5-7 days each months.  The first 2 days are heavy (typically uses 8 pads a day) then menses decreases to 6-7 pads/day x 3 days.  She can get light headed or dizzy after menses.  She has tried birth control pills once which resulted in a rash.  She has not tried any further oral birth control pills.  The patient was seen by Dr. Odis Hollingshead at [facility] on 07/08/2014.  She was noted to have microcytic anemia since 02/2014.  She was noted to have iron deficiency.  She received IV iron (Feraheme 1024 mg) on 07/08/2014 and 07/14/2014.  Discussion was held regarding planned fibroid surgery on 07/24/2014 to avoid future vaginal bleeding.  Labs on 07/21/2016 included a hematocrit of 30.9, hemoglobin 9.3, MCV 73.4, platelets 675,000, white count 5300 with an [place] of 2650. Differential was unremarkable. Comprehensive metabolic panel included a creatinine of 0.7.  Liver function tests were normal.  Iron studies included a ferritin of 6, iron saturation 6% and TIBC of 440.  B12 was 462.  HIV testing was negative.  Symptomatically, she is tired a lot.  She has lost weight with exercising.  Weight has leveled off in the past 3 weeks.  She denies any shortness of breath or chest pain.  She notes a diagnosis of B12 deficiency.  She received B12 shots x 2-3 months.  She states they "helped a  little".  She has been on oral iron with orange juice for one month.  Her mother has a history of heavy menses, but no anemia.   Past Medical History:  Diagnosis Date  . Anemia   . Dizziness   . Fibroid uterus   . GERD (gastroesophageal reflux disease)   . IBS (irritable bowel syndrome)   . Migraine   . Polyp of stomach 01-06-16   Stomach polyp, pyloric    Past Surgical History:  Procedure Laterality Date  . APPENDECTOMY  2012  . ESOPHAGOGASTRODUODENOSCOPY (EGD) WITH PROPOFOL N/A 01/06/2016   Procedure: ESOPHAGOGASTRODUODENOSCOPY (EGD) WITH PROPOFOL;  Surgeon: Robert Bellow, MD;  Location: [facility] ENDOSCOPY;  Service: Endoscopy;  Laterality: N/A;  . ROBOTIC ASSISTED LAPAROSCOPIC CHOLECYSTECTOMY N/A 03/03/2016   Procedure: ROBOTIC ASSISTED LAPAROSCOPIC CHOLECYSTECTOMY ;  Surgeon: Clayburn Pert, MD;  Location: [facility] ORS;  Service: General;  Laterality: N/A;  . UTERINE FIBROID SURGERY  2012    Family History  Problem Relation Age of Onset  . Hypertension Mother   . Hypertension Father     Social History:  reports that she has never smoked. She has never used smokeless tobacco. She reports that she does not drink alcohol or use drugs.  She denies any exposure to radiation or toxins.  She works at [employer] as a Psychologist, sport and exercise.  She lives in [place].  The patient is alone today.  Allergies:  Allergies  Allergen Reactions  . Aspirin Nausea And Vomiting  . Oxycodone-Acetaminophen Rash and Shortness Of Breath  . Tape Other (See Comments)    Burning feeling. Has left mark on skin.  Melynda Keller [Lorcaserin Hcl] Other (See Comments)    headache  . Contrave [Naltrexone-Bupropion Hcl Er]   . Dicyclomine Nausea And Vomiting  . Norethindrone Rash    Current Medications: Current Outpatient Prescriptions  Medication Sig Dispense Refill  . amitriptyline (ELAVIL) 25 MG tablet Take 1 tablet (25 mg total) by mouth at bedtime. 30 tablet 0  . ciprofloxacin (CIPRO) 250 MG tablet Take  1 tablet (250 mg total) by mouth 2 (two) times daily. 10 tablet 0  . docusate sodium (COLACE) 100 MG capsule Take 1 capsule (100 mg total) by mouth daily. (Patient taking differently: Take 100 mg by mouth daily as needed for mild constipation. ) 90 capsule 3  . Insulin Pen Needle (NOVOFINE) 32G X 6 MM MISC 1 each by Does not apply route every morning. 100 each 5  . linaclotide (LINZESS) 145 MCG CAPS capsule Take 1 capsule (145 mcg total) by mouth daily before breakfast. (Patient taking differently: Take 145 mcg by mouth as needed. ) 30 capsule 2  . Liraglutide -Weight Management (SAXENDA) 18 MG/3ML SOPN Inject 3 mg into the skin daily. 5 pen 2  . oseltamivir (TAMIFLU) 75 MG capsule Take 1 capsule (75 mg total) by mouth daily. 7 capsule 0  . naproxen (NAPROSYN) 500 MG tablet Take 1 po BID with food prn pain (Patient not taking: Reported on 08/25/2016) 30 tablet 0  . omeprazole (PRILOSEC) 40 MG capsule Take 1 capsule (40 mg total) by mouth daily. (Patient not taking: Reported on 08/25/2016) 30 capsule 1  . ondansetron (ZOFRAN-ODT) 4 MG disintegrating tablet Take 1 tablet (4 mg total) by mouth every 8 (eight) hours as needed for nausea or vomiting. (Patient not taking: Reported on 08/25/2016) 20 tablet 0   No current facility-administered medications for this visit.     Review of Systems:  GENERAL:  Tired a lot.  Head sweats at night.  No fevers or unintentional weight loss. PERFORMANCE STATUS (ECOG):  0 HEENT:  No visual changes, runny nose, sore throat, mouth sores or tenderness. Lungs: No shortness of breath or cough.  No hemoptysis. Cardiac:  No chest pain, palpitations, orthopnea, or PND. GI:  No nausea, vomiting, diarrhea, constipation, melena or hematochezia. GU:  No urgency, frequency, dysuria, or hematuria. Musculoskeletal:  No back pain.  Joints sore.  No muscle tenderness. Extremities:  No pain or swelling. Skin:  No rashes or skin changes. Neuro:  Headaches x 2 months.  Little dizzy.  No  numbness or weakness, balance or coordination issues. Endocrine:  No diabetes, thyroid issues, hot flashes or night sweats. Psych:  No mood changes, depression or anxiety. Pain:  No focal pain. Review of systems:  All other systems reviewed and found to be negative.  Physical Exam: Blood pressure 126/83, pulse 82, temperature (!) 95.3 F (35.2 C), temperature source Tympanic, height 5\' 5"  (1.651 m), weight 205 lb 14.6 oz (93.4 kg). GENERAL:  Well developed, well nourished, woman sitting comfortably in the exam room in no acute distress. MENTAL STATUS:  Alert and oriented to person, place and time. HEAD:  Short brown hair.  Normocephalic, atraumatic, face symmetric, no Cushingoid features. EYES:  Brown eyes.  Pupils equal round and reactive to light and accomodation.  No conjunctivitis or scleral icterus. ENT:  Oropharynx clear without lesion.  Tongue  normal. Mucous membranes moist.  RESPIRATORY:  Clear to auscultation without rales, wheezes or rhonchi. CARDIOVASCULAR:  Regular rate and rhythm without murmur, rub or gallop. ABDOMEN:  Soft, non-tender, with active bowel sounds, and no appreciable hepatosplenomegaly.  No masses. SKIN:  No rashes, ulcers or lesions. EXTREMITIES: No edema, no skin discoloration or tenderness.  No palpable cords. LYMPH NODES: No palpable cervical, supraclavicular, axillary or inguinal adenopathy  NEUROLOGICAL: Unremarkable. PSYCH:  Appropriate.   No visits with results within 3 Day(s) from this visit.  Latest known visit with results is:  Orders Only on 07/18/2016  Component Date Value Ref Range Status  . HPV DNA High Risk 07/20/2016 Not Detected   Final   Comment: HIGH RISK HPV types (16,18,31,33,35,39,45,51,52,56,58,59,66,68) were not detected. Other HPV types which cause anogenital lesions may be present. The significance of the other types of HPV in malignant  processes has not been established.                  ** Normal Reference Range: Not Detected  **      HPV High Risk testing performed using the APTIMA HPV mRNA Assay.     Marland Kitchen Specimen adequacy: 07/20/2016    Final   SATISFACTORY.  Endocervical/transformation zone component present.  Marland Kitchen FINAL DIAGNOSIS: 07/20/2016    Final   Comment: - NEGATIVE FOR INTRAEPITHELIAL LESIONS OR MALIGNANCY.   Marland Kitchen COMMENTS: 07/20/2016    Final   Comment: LMP (Last Menstrual Period) of the patient is not given. This Pap test has been evaluated with computer assisted technology.   . Cytotechnologist: 07/20/2016    Final   Comment: JWW, BS CT(ASCP), MLT(ASCP) *  The Pap is a screening test for cervical cancer. It is not a  diagnostic test and is subject to false negative and false positive  results. It is most reliable when a satisfactory sample, regularly  obtained, is submitted with relevant clinical findings and history,  and when the Pap result is evaluated along with historic and current  clinical information.     Assessment:  Sharon Herring is a 41 y.o. female with iron deficiency anemia secondary to heavy menses.  She has a history of on and off anemia for several years (before 02/2014).  Diet is fairly good.  She denies any melena, hematochezia, or hematuria.  She has had ice pica x 2 months.  She has been on oral iron for 1 month.  She has a history of unresponsiveness to oral iron (4 month trial in 2015).  Labs on 07/21/2016 included a hematocrit of 30.9, hemoglobin 9.3, MCV 73.4, platelets 675,000, white count 5300 with an [place] of 2650. Creatinine was 0.7.  Liver function tests were normal.  Iron studies included a ferritin of 6, iron saturation 6% and TIBC of 440.  HIV testing was negative.  She has B12 deficiency.  She received B12 shots x 2-3 months.  B12 was 462 on 07/21/2016.  Symptomatically, she is tired a lot.  Exam is unremarkable.  Plan: 1.  Discuss diagnosis and management of iron deficiency.  Discuss heavy menses.  Discuss IV iron.  Side effects were reviewed. 2.  Labs today:  CBC  with diff, ferritin, retic. 3.  Preauth Feraheme. 4.  Call patient with lab results.  5.  Feraheme weekly x 2 6.  RTC in 1 month for MD assessment and labs (CBC with diff, ferritin- day before)   Lequita Asal, MD  08/25/2016, 9:34 AM

## 2016-08-25 NOTE — Progress Notes (Signed)
Patient here today as new evaluation regarding anemia.  Referred by Dr. Ancil Boozer.  No SOB. Currently on Cipro for UTI.

## 2016-08-29 ENCOUNTER — Other Ambulatory Visit: Payer: Self-pay | Admitting: Hematology and Oncology

## 2016-08-29 ENCOUNTER — Inpatient Hospital Stay: Payer: 59

## 2016-08-29 VITALS — BP 130/80 | HR 82 | Temp 96.9°F | Resp 20

## 2016-08-29 DIAGNOSIS — Z79899 Other long term (current) drug therapy: Secondary | ICD-10-CM | POA: Diagnosis not present

## 2016-08-29 DIAGNOSIS — R5383 Other fatigue: Secondary | ICD-10-CM | POA: Diagnosis not present

## 2016-08-29 DIAGNOSIS — K219 Gastro-esophageal reflux disease without esophagitis: Secondary | ICD-10-CM | POA: Diagnosis not present

## 2016-08-29 DIAGNOSIS — D500 Iron deficiency anemia secondary to blood loss (chronic): Secondary | ICD-10-CM | POA: Diagnosis not present

## 2016-08-29 DIAGNOSIS — D509 Iron deficiency anemia, unspecified: Secondary | ICD-10-CM

## 2016-08-29 DIAGNOSIS — E538 Deficiency of other specified B group vitamins: Secondary | ICD-10-CM | POA: Diagnosis not present

## 2016-08-29 DIAGNOSIS — N920 Excessive and frequent menstruation with regular cycle: Secondary | ICD-10-CM | POA: Diagnosis not present

## 2016-08-29 DIAGNOSIS — K589 Irritable bowel syndrome without diarrhea: Secondary | ICD-10-CM | POA: Diagnosis not present

## 2016-08-29 MED ORDER — SODIUM CHLORIDE 0.9 % IV SOLN
Freq: Once | INTRAVENOUS | Status: AC
  Administered 2016-08-29: 15:00:00 via INTRAVENOUS
  Filled 2016-08-29: qty 1000

## 2016-08-29 MED ORDER — SODIUM CHLORIDE 0.9 % IV SOLN
510.0000 mg | Freq: Once | INTRAVENOUS | Status: AC
  Administered 2016-08-29: 510 mg via INTRAVENOUS
  Filled 2016-08-29: qty 17

## 2016-09-05 ENCOUNTER — Inpatient Hospital Stay: Payer: 59

## 2016-09-05 DIAGNOSIS — D500 Iron deficiency anemia secondary to blood loss (chronic): Secondary | ICD-10-CM | POA: Diagnosis not present

## 2016-09-05 DIAGNOSIS — N920 Excessive and frequent menstruation with regular cycle: Secondary | ICD-10-CM | POA: Diagnosis not present

## 2016-09-05 DIAGNOSIS — D509 Iron deficiency anemia, unspecified: Secondary | ICD-10-CM

## 2016-09-05 DIAGNOSIS — K589 Irritable bowel syndrome without diarrhea: Secondary | ICD-10-CM | POA: Diagnosis not present

## 2016-09-05 DIAGNOSIS — Z79899 Other long term (current) drug therapy: Secondary | ICD-10-CM | POA: Diagnosis not present

## 2016-09-05 DIAGNOSIS — E538 Deficiency of other specified B group vitamins: Secondary | ICD-10-CM | POA: Diagnosis not present

## 2016-09-05 DIAGNOSIS — R5383 Other fatigue: Secondary | ICD-10-CM | POA: Diagnosis not present

## 2016-09-05 DIAGNOSIS — K219 Gastro-esophageal reflux disease without esophagitis: Secondary | ICD-10-CM | POA: Diagnosis not present

## 2016-09-05 MED ORDER — SODIUM CHLORIDE 0.9 % IV SOLN
510.0000 mg | Freq: Once | INTRAVENOUS | Status: AC
  Administered 2016-09-05: 510 mg via INTRAVENOUS
  Filled 2016-09-05: qty 17

## 2016-09-05 MED ORDER — SODIUM CHLORIDE 0.9 % IV SOLN
Freq: Once | INTRAVENOUS | Status: AC
  Administered 2016-09-05: 15:00:00 via INTRAVENOUS
  Filled 2016-09-05: qty 1000

## 2016-09-12 ENCOUNTER — Other Ambulatory Visit: Payer: Self-pay | Admitting: Family Medicine

## 2016-09-12 DIAGNOSIS — R21 Rash and other nonspecific skin eruption: Secondary | ICD-10-CM

## 2016-09-12 MED ORDER — TRIAMCINOLONE ACETONIDE 0.1 % EX CREA: 1.0000 "application " | TOPICAL_CREAM | Freq: Two times a day (BID) | CUTANEOUS | 0 refills | Status: DC

## 2016-09-12 MED ORDER — HYDROXYZINE HCL 25 MG PO TABS
25.0000 mg | ORAL_TABLET | Freq: Three times a day (TID) | ORAL | 0 refills | Status: DC | PRN
Start: 2016-09-12 — End: 2017-01-04

## 2016-09-16 ENCOUNTER — Other Ambulatory Visit: Payer: Self-pay | Admitting: *Deleted

## 2016-09-16 DIAGNOSIS — D508 Other iron deficiency anemias: Secondary | ICD-10-CM

## 2016-09-21 ENCOUNTER — Inpatient Hospital Stay: Payer: 59

## 2016-09-21 DIAGNOSIS — N920 Excessive and frequent menstruation with regular cycle: Secondary | ICD-10-CM | POA: Diagnosis not present

## 2016-09-21 DIAGNOSIS — D508 Other iron deficiency anemias: Secondary | ICD-10-CM

## 2016-09-21 DIAGNOSIS — K219 Gastro-esophageal reflux disease without esophagitis: Secondary | ICD-10-CM | POA: Diagnosis not present

## 2016-09-21 DIAGNOSIS — K589 Irritable bowel syndrome without diarrhea: Secondary | ICD-10-CM | POA: Diagnosis not present

## 2016-09-21 DIAGNOSIS — R5383 Other fatigue: Secondary | ICD-10-CM | POA: Diagnosis not present

## 2016-09-21 DIAGNOSIS — D500 Iron deficiency anemia secondary to blood loss (chronic): Secondary | ICD-10-CM | POA: Diagnosis not present

## 2016-09-21 DIAGNOSIS — Z79899 Other long term (current) drug therapy: Secondary | ICD-10-CM | POA: Diagnosis not present

## 2016-09-21 DIAGNOSIS — E538 Deficiency of other specified B group vitamins: Secondary | ICD-10-CM | POA: Diagnosis not present

## 2016-09-21 LAB — CBC WITH DIFFERENTIAL/PLATELET
Basophils Absolute: 0 10*3/uL (ref 0–0.1)
Basophils Relative: 0 %
Eosinophils Absolute: 0 10*3/uL (ref 0–0.7)
Eosinophils Relative: 1 %
HCT: 32.5 % — ABNORMAL LOW (ref 35.0–47.0)
Hemoglobin: 10.5 g/dL — ABNORMAL LOW (ref 12.0–16.0)
Lymphocytes Relative: 33 %
Lymphs Abs: 2.1 10*3/uL (ref 1.0–3.6)
MCH: 24 pg — ABNORMAL LOW (ref 26.0–34.0)
MCHC: 32.4 g/dL (ref 32.0–36.0)
MCV: 74 fL — ABNORMAL LOW (ref 80.0–100.0)
Monocytes Absolute: 0.6 10*3/uL (ref 0.2–0.9)
Monocytes Relative: 9 %
Neutro Abs: 3.6 10*3/uL (ref 1.4–6.5)
Neutrophils Relative %: 57 %
Platelets: 541 10*3/uL — ABNORMAL HIGH (ref 150–440)
RBC: 4.38 MIL/uL (ref 3.80–5.20)
RDW: 24.6 % — ABNORMAL HIGH (ref 11.5–14.5)
WBC: 6.4 10*3/uL (ref 3.6–11.0)

## 2016-09-21 LAB — FERRITIN: Ferritin: 214 ng/mL (ref 11–307)

## 2016-09-22 ENCOUNTER — Inpatient Hospital Stay: Payer: 59 | Admitting: Hematology and Oncology

## 2016-09-22 ENCOUNTER — Other Ambulatory Visit: Payer: 59

## 2016-09-22 NOTE — Progress Notes (Deleted)
[facility] day:  09/22/2016  Chief Complaint: Sharon Herring is a 41 y.o. female with iron defieicny anemia who is seen for reassessment after IV iron.  HPI: The patient was last seen in the hematology clinic on 08/25/2016.  At that time, she was seen for initial assessment.  CBC revealed a hematocrit of 28.3, hemoglobin 9.2, and MCV 69.6.  Ferritin was 5.  She received Feraheme 510 mg IV on 08/29/2016 and 09/05/2016.  CBC won 09/21/2016 revealed a hematocrit of 32.5, hemoglobin 10.5, and MCV 74.  Ferritin was 214.  notes a history of on and off anemia for several years.  She notes that her diet is fairly good.  She eats grilled chicken, salmon and green vegetables.  She denies any melena, hematochezia, or hematuria.  She notes ice pica x 2 months.  She has been on oral iron 1 pill a day x 1 month.  She notes an issue with heavy menses for years.  Menses initially improved after fibroid resection in 2011.  Menses typically lasts 5- 7 days each months.  The first 2 days are heavy (typically uses 8 pads a day) then menses decreases to 6-7 pads/day x 3 days.  She can get light headed or dizzy after menses.  She has tried birth control pilss once which resulted in a rash.  She has not tried any further oral birth control pills.  The patient was seen by Dr. Odis Hollingshead at [facility] on 07/08/2014.   she has lost 42 lbs since 03/2016 when we started her on Saxenda. She states it curbs her appetite. She has also changed her diet, eating healthier. She is walking daily for 30 minutes.    Labs on 07/21/2016 included a hematocrit of 30.9, hemoglobin 9.3, MCV 73.4, platelets 675,000, white count 5300 with an [place] of 2650. If her and she was unremarkable. Comprehensive metabolic panel included a creatinine of 0.7.  Liver function tests were normal.  Iron studies included a ferritin of 6, iron saturation 6% and TIBC of 440.  B12 was 462.  HIV testing was  negative.   Past Medical History:  Diagnosis Date  . Anemia   . Dizziness   . Fibroid uterus   . GERD (gastroesophageal reflux disease)   . IBS (irritable bowel syndrome)   . Migraine   . Polyp of stomach 01-06-16   Stomach polyp, pyloric    Past Surgical History:  Procedure Laterality Date  . APPENDECTOMY  2012  . ESOPHAGOGASTRODUODENOSCOPY (EGD) WITH PROPOFOL N/A 01/06/2016   Procedure: ESOPHAGOGASTRODUODENOSCOPY (EGD) WITH PROPOFOL;  Surgeon: Robert Bellow, MD;  Location: [facility] ENDOSCOPY;  Service: Endoscopy;  Laterality: N/A;  . ROBOTIC ASSISTED LAPAROSCOPIC CHOLECYSTECTOMY N/A 03/03/2016   Procedure: ROBOTIC ASSISTED LAPAROSCOPIC CHOLECYSTECTOMY ;  Surgeon: Clayburn Pert, MD;  Location: [facility] ORS;  Service: General;  Laterality: N/A;  . UTERINE FIBROID SURGERY  2012    Family History  Problem Relation Age of Onset  . Hypertension Mother   . Hypertension Father     Social History:  reports that she has never smoked. She has never used smokeless tobacco. She reports that she does not drink alcohol or use drugs.  The patient is accompanied by *** alone today.  Allergies:  Allergies  Allergen Reactions  . Aspirin Nausea And Vomiting  . Oxycodone-Acetaminophen Rash and Shortness Of Breath  . Tape Other (See Comments)    Burning feeling. Has left mark on skin.  Melynda Keller [Lorcaserin  Hcl] Other (See Comments)    headache  . Contrave [Naltrexone-Bupropion Hcl Er]   . Dicyclomine Nausea And Vomiting  . Norethindrone Rash    Current Medications: Current Outpatient Prescriptions  Medication Sig Dispense Refill  . amitriptyline (ELAVIL) 25 MG tablet Take 1 tablet (25 mg total) by mouth at bedtime. 30 tablet 0  . docusate sodium (COLACE) 100 MG capsule Take 1 capsule (100 mg total) by mouth daily. (Patient taking differently: Take 100 mg by mouth daily as needed for mild constipation. ) 90 capsule 3  . hydrOXYzine (ATARAX/VISTARIL) 25 MG tablet Take 1 tablet (25 mg total)  by mouth 3 (three) times daily as needed. 30 tablet 0  . Insulin Pen Needle (NOVOFINE) 32G X 6 MM MISC 1 each by Does not apply route every morning. 100 each 5  . linaclotide (LINZESS) 145 MCG CAPS capsule Take 1 capsule (145 mcg total) by mouth daily before breakfast. (Patient taking differently: Take 145 mcg by mouth as needed. ) 30 capsule 2  . Liraglutide -Weight Management (SAXENDA) 18 MG/3ML SOPN Inject 3 mg into the skin daily. 5 pen 2  . oseltamivir (TAMIFLU) 75 MG capsule Take 1 capsule (75 mg total) by mouth daily. 7 capsule 0  . triamcinolone cream (KENALOG) 0.1 % Apply 1 application topically 2 (two) times daily. 30 g 0   No current facility-administered medications for this visit.     Review of Systems:  GENERAL:  Feels good.  Active.  No fevers, sweats or weight loss. PERFORMANCE STATUS (ECOG):  *** HEENT:  No visual changes, runny nose, sore throat, mouth sores or tenderness. Lungs: No shortness of breath or cough.  No hemoptysis. Cardiac:  No chest pain, palpitations, orthopnea, or PND. GI:  No nausea, vomiting, diarrhea, constipation, melena or hematochezia. GU:  No urgency, frequency, dysuria, or hematuria. Musculoskeletal:  No back pain.  No joint pain.  No muscle tenderness. Extremities:  No pain or swelling. Skin:  No rashes or skin changes. Neuro:  No headache, numbness or weakness, balance or coordination issues. Endocrine:  No diabetes, thyroid issues, hot flashes or night sweats. Psych:  No mood changes, depression or anxiety. Pain:  No focal pain. Review of systems:  All other systems reviewed and found to be negative.  Physical Exam: There were no vitals taken for this visit. GENERAL:  Well developed, well nourished, sitting comfortably in the exam room in no acute distress. MENTAL STATUS:  Alert and oriented to person, place and time. HEAD:  *** hair.  Normocephalic, atraumatic, face symmetric, no Cushingoid features. EYES:  *** eyes.  Pupils equal round  and reactive to light and accomodation.  No conjunctivitis or scleral icterus. ENT:  Oropharynx clear without lesion.  Tongue normal. Mucous membranes moist.  RESPIRATORY:  Clear to auscultation without rales, wheezes or rhonchi. CARDIOVASCULAR:  Regular rate and rhythm without murmur, rub or gallop. ABDOMEN:  Soft, non-tender, with active bowel sounds, and no hepatosplenomegaly.  No masses. SKIN:  No rashes, ulcers or lesions. EXTREMITIES: No edema, no skin discoloration or tenderness.  No palpable cords. LYMPH NODES: No palpable cervical, supraclavicular, axillary or inguinal adenopathy  NEUROLOGICAL: Unremarkable. PSYCH:  Appropriate.  Appointment on 09/21/2016  Component Date Value Ref Range Status  . WBC 09/21/2016 6.4  3.6 - 11.0 K/uL Final  . RBC 09/21/2016 4.38  3.80 - 5.20 MIL/uL Final  . Hemoglobin 09/21/2016 10.5* 12.0 - 16.0 g/dL Final  . HCT 09/21/2016 32.5* 35.0 - 47.0 % Final  . MCV 09/21/2016  74.0* 80.0 - 100.0 fL Final  . MCH 09/21/2016 24.0* 26.0 - 34.0 pg Final  . MCHC 09/21/2016 32.4  32.0 - 36.0 g/dL Final  . RDW 09/21/2016 24.6* 11.5 - 14.5 % Final  . Platelets 09/21/2016 541* 150 - 440 K/uL Final  . Neutrophils Relative % 09/21/2016 57  % Final  . Neutro Abs 09/21/2016 3.6  1.4 - 6.5 K/uL Final  . Lymphocytes Relative 09/21/2016 33  % Final  . Lymphs Abs 09/21/2016 2.1  1.0 - 3.6 K/uL Final  . Monocytes Relative 09/21/2016 9  % Final  . Monocytes Absolute 09/21/2016 0.6  0.2 - 0.9 K/uL Final  . Eosinophils Relative 09/21/2016 1  % Final  . Eosinophils Absolute 09/21/2016 0.0  0 - 0.7 K/uL Final  . Basophils Relative 09/21/2016 0  % Final  . Basophils Absolute 09/21/2016 0.0  0 - 0.1 K/uL Final  . Ferritin 09/21/2016 214  11 - 307 ng/mL Final    Assessment:  Sharon Herring is a 41 y.o. female ***  Plan: 1.  Discuss 2.  Labs today:  CBC with diff, ferritin, retic. 3.  Preauth Feraheme 4.  Call patient with lab results.  5.  Feraheme weekly x 2 6.  RTC  in 1 month for MD assessment and labs (CBC with diff, ferritin- day before)   Lequita Asal, MD  09/22/2016, 6:10 AM

## 2016-09-23 ENCOUNTER — Ambulatory Visit: Payer: Self-pay | Admitting: Physician Assistant

## 2016-09-26 ENCOUNTER — Inpatient Hospital Stay: Payer: 59 | Admitting: Hematology and Oncology

## 2016-09-26 NOTE — Progress Notes (Deleted)
[facility] day:  09/26/2016  Chief Complaint: Sharon Herring is a 41 y.o. female with iron defieicny anemia who is seen for reassessment after IV iron.  HPI: The patient was last seen in the hematology clinic on 08/25/2016.  At that time, she was seen for initial assessment.  CBC revealed a hematocrit of 28.3, hemoglobin 9.2, and MCV 69.6.  Ferritin was 5.  She received Feraheme 510 mg IV on 08/29/2016 and 09/05/2016.  CBC on 09/21/2016 revealed a hematocrit of 32.5, hemoglobin 10.5, and MCV 74.  Ferritin was 214.  notes a history of on and off anemia for several years.  She notes that her diet is fairly good.  She eats grilled chicken, salmon and green vegetables.  She denies any melena, hematochezia, or hematuria.  She notes ice pica x 2 months.  She has been on oral iron 1 pill a day x 1 month.  She notes an issue with heavy menses for years.  Menses initially improved after fibroid resection in 2011.  Menses typically lasts 5- 7 days each months.  The first 2 days are heavy (typically uses 8 pads a day) then menses decreases to 6-7 pads/day x 3 days.  She can get light headed or dizzy after menses.  She has tried birth control pills once which resulted in a rash.  She has not tried any further oral birth control pills.  The patient was seen by Dr. Odis Hollingshead at [facility] on 07/08/2014.   she has lost 42 lbs since 03/2016 when we started her on Saxenda. She states it curbs her appetite. She has also changed her diet, eating healthier. She is walking daily for 30 minutes.    Labs on 07/21/2016 included a hematocrit of 30.9, hemoglobin 9.3, MCV 73.4, platelets 675,000, white count 5300 with an [place] of 2650. If her and she was unremarkable. Comprehensive metabolic panel included a creatinine of 0.7.  Liver function tests were normal.  Iron studies included a ferritin of 6, iron saturation 6% and TIBC of 440.  B12 was 462.  HIV testing was  negative.   Past Medical History:  Diagnosis Date  . Anemia   . Dizziness   . Fibroid uterus   . GERD (gastroesophageal reflux disease)   . IBS (irritable bowel syndrome)   . Migraine   . Polyp of stomach 01-06-16   Stomach polyp, pyloric    Past Surgical History:  Procedure Laterality Date  . APPENDECTOMY  2012  . ESOPHAGOGASTRODUODENOSCOPY (EGD) WITH PROPOFOL N/A 01/06/2016   Procedure: ESOPHAGOGASTRODUODENOSCOPY (EGD) WITH PROPOFOL;  Surgeon: Robert Bellow, MD;  Location: [facility] ENDOSCOPY;  Service: Endoscopy;  Laterality: N/A;  . ROBOTIC ASSISTED LAPAROSCOPIC CHOLECYSTECTOMY N/A 03/03/2016   Procedure: ROBOTIC ASSISTED LAPAROSCOPIC CHOLECYSTECTOMY ;  Surgeon: Clayburn Pert, MD;  Location: [facility] ORS;  Service: General;  Laterality: N/A;  . UTERINE FIBROID SURGERY  2012    Family History  Problem Relation Age of Onset  . Hypertension Mother   . Hypertension Father     Social History:  reports that she has never smoked. She has never used smokeless tobacco. She reports that she does not drink alcohol or use drugs.  The patient is accompanied by *** alone today.  Allergies:  Allergies  Allergen Reactions  . Aspirin Nausea And Vomiting  . Oxycodone-Acetaminophen Rash and Shortness Of Breath  . Tape Other (See Comments)    Burning feeling. Has left mark on skin.  Melynda Keller [Lorcaserin  Hcl] Other (See Comments)    headache  . Contrave [Naltrexone-Bupropion Hcl Er]   . Dicyclomine Nausea And Vomiting  . Norethindrone Rash    Current Medications: Current Outpatient Prescriptions  Medication Sig Dispense Refill  . amitriptyline (ELAVIL) 25 MG tablet Take 1 tablet (25 mg total) by mouth at bedtime. 30 tablet 0  . docusate sodium (COLACE) 100 MG capsule Take 1 capsule (100 mg total) by mouth daily. (Patient taking differently: Take 100 mg by mouth daily as needed for mild constipation. ) 90 capsule 3  . hydrOXYzine (ATARAX/VISTARIL) 25 MG tablet Take 1 tablet (25 mg total)  by mouth 3 (three) times daily as needed. 30 tablet 0  . Insulin Pen Needle (NOVOFINE) 32G X 6 MM MISC 1 each by Does not apply route every morning. 100 each 5  . linaclotide (LINZESS) 145 MCG CAPS capsule Take 1 capsule (145 mcg total) by mouth daily before breakfast. (Patient taking differently: Take 145 mcg by mouth as needed. ) 30 capsule 2  . Liraglutide -Weight Management (SAXENDA) 18 MG/3ML SOPN Inject 3 mg into the skin daily. 5 pen 2  . oseltamivir (TAMIFLU) 75 MG capsule Take 1 capsule (75 mg total) by mouth daily. 7 capsule 0  . triamcinolone cream (KENALOG) 0.1 % Apply 1 application topically 2 (two) times daily. 30 g 0   No current facility-administered medications for this visit.     Review of Systems:  GENERAL:  Feels good.  Active.  No fevers, sweats or weight loss. PERFORMANCE STATUS (ECOG):  *** HEENT:  No visual changes, runny nose, sore throat, mouth sores or tenderness. Lungs: No shortness of breath or cough.  No hemoptysis. Cardiac:  No chest pain, palpitations, orthopnea, or PND. GI:  No nausea, vomiting, diarrhea, constipation, melena or hematochezia. GU:  No urgency, frequency, dysuria, or hematuria. Musculoskeletal:  No back pain.  No joint pain.  No muscle tenderness. Extremities:  No pain or swelling. Skin:  No rashes or skin changes. Neuro:  No headache, numbness or weakness, balance or coordination issues. Endocrine:  No diabetes, thyroid issues, hot flashes or night sweats. Psych:  No mood changes, depression or anxiety. Pain:  No focal pain. Review of systems:  All other systems reviewed and found to be negative.  Physical Exam: There were no vitals taken for this visit. GENERAL:  Well developed, well nourished, sitting comfortably in the exam room in no acute distress. MENTAL STATUS:  Alert and oriented to person, place and time. HEAD:  *** hair.  Normocephalic, atraumatic, face symmetric, no Cushingoid features. EYES:  *** eyes.  Pupils equal round  and reactive to light and accomodation.  No conjunctivitis or scleral icterus. ENT:  Oropharynx clear without lesion.  Tongue normal. Mucous membranes moist.  RESPIRATORY:  Clear to auscultation without rales, wheezes or rhonchi. CARDIOVASCULAR:  Regular rate and rhythm without murmur, rub or gallop. ABDOMEN:  Soft, non-tender, with active bowel sounds, and no hepatosplenomegaly.  No masses. SKIN:  No rashes, ulcers or lesions. EXTREMITIES: No edema, no skin discoloration or tenderness.  No palpable cords. LYMPH NODES: No palpable cervical, supraclavicular, axillary or inguinal adenopathy  NEUROLOGICAL: Unremarkable. PSYCH:  Appropriate.  No visits with results within 3 Day(s) from this visit.  Latest known visit with results is:  Appointment on 09/21/2016  Component Date Value Ref Range Status  . WBC 09/21/2016 6.4  3.6 - 11.0 K/uL Final  . RBC 09/21/2016 4.38  3.80 - 5.20 MIL/uL Final  . Hemoglobin 09/21/2016 10.5* 12.0 -  16.0 g/dL Final  . HCT 09/21/2016 32.5* 35.0 - 47.0 % Final  . MCV 09/21/2016 74.0* 80.0 - 100.0 fL Final  . MCH 09/21/2016 24.0* 26.0 - 34.0 pg Final  . MCHC 09/21/2016 32.4  32.0 - 36.0 g/dL Final  . RDW 09/21/2016 24.6* 11.5 - 14.5 % Final  . Platelets 09/21/2016 541* 150 - 440 K/uL Final  . Neutrophils Relative % 09/21/2016 57  % Final  . Neutro Abs 09/21/2016 3.6  1.4 - 6.5 K/uL Final  . Lymphocytes Relative 09/21/2016 33  % Final  . Lymphs Abs 09/21/2016 2.1  1.0 - 3.6 K/uL Final  . Monocytes Relative 09/21/2016 9  % Final  . Monocytes Absolute 09/21/2016 0.6  0.2 - 0.9 K/uL Final  . Eosinophils Relative 09/21/2016 1  % Final  . Eosinophils Absolute 09/21/2016 0.0  0 - 0.7 K/uL Final  . Basophils Relative 09/21/2016 0  % Final  . Basophils Absolute 09/21/2016 0.0  0 - 0.1 K/uL Final  . Ferritin 09/21/2016 214  11 - 307 ng/mL Final    Assessment:  Sharon Herring is a 41 y.o. female ***  Plan: 1.  Discuss 2.  Labs today:  CBC with diff, ferritin,  retic. 3.  Preauth Feraheme 4.  Call patient with lab results.  5.  Feraheme weekly x 2 6.  RTC in 1 month for MD assessment and labs (CBC with diff, ferritin- day before)   Lequita Asal, MD  09/26/2016, 5:28 AM

## 2016-10-04 ENCOUNTER — Other Ambulatory Visit: Payer: Self-pay | Admitting: *Deleted

## 2016-10-04 DIAGNOSIS — D508 Other iron deficiency anemias: Secondary | ICD-10-CM

## 2016-10-06 ENCOUNTER — Inpatient Hospital Stay: Payer: 59

## 2016-10-06 ENCOUNTER — Inpatient Hospital Stay: Payer: 59 | Admitting: Hematology and Oncology

## 2016-10-06 ENCOUNTER — Other Ambulatory Visit: Payer: Self-pay

## 2016-10-06 DIAGNOSIS — L709 Acne, unspecified: Secondary | ICD-10-CM

## 2016-10-06 NOTE — Progress Notes (Deleted)
[facility] day:  10/06/2016  Chief Complaint: Sharon Herring is a 41 y.o. female with iron deficiency anemia who is seen for reassessment after IV iron.  HPI: The patient was last seen in the hematology clinic on 08/25/2016.  At that time, she was seen for initial assessment.  CBC revealed a hematocrit of 28.3, hemoglobin 9.2, and MCV 69.6.  Ferritin was 5.  She received Feraheme 510 mg IV on 08/29/2016 and 09/05/2016.  CBC on 09/21/2016 revealed a hematocrit of 32.5, hemoglobin 10.5, and MCV 74.  Ferritin was 214.  Symptomatically,   Past Medical History:  Diagnosis Date  . Anemia   . Dizziness   . Fibroid uterus   . GERD (gastroesophageal reflux disease)   . IBS (irritable bowel syndrome)   . Migraine   . Polyp of stomach 01-06-16   Stomach polyp, pyloric    Past Surgical History:  Procedure Laterality Date  . APPENDECTOMY  2012  . ESOPHAGOGASTRODUODENOSCOPY (EGD) WITH PROPOFOL N/A 01/06/2016   Procedure: ESOPHAGOGASTRODUODENOSCOPY (EGD) WITH PROPOFOL;  Surgeon: Robert Bellow, MD;  Location: [facility] ENDOSCOPY;  Service: Endoscopy;  Laterality: N/A;  . ROBOTIC ASSISTED LAPAROSCOPIC CHOLECYSTECTOMY N/A 03/03/2016   Procedure: ROBOTIC ASSISTED LAPAROSCOPIC CHOLECYSTECTOMY ;  Surgeon: Clayburn Pert, MD;  Location: [facility] ORS;  Service: General;  Laterality: N/A;  . UTERINE FIBROID SURGERY  2012    Family History  Problem Relation Age of Onset  . Hypertension Mother   . Hypertension Father     Social History:  reports that she has never smoked. She has never used smokeless tobacco. She reports that she does not drink alcohol or use drugs.  The patient is accompanied by *** alone today.  Allergies:  Allergies  Allergen Reactions  . Aspirin Nausea And Vomiting  . Oxycodone-Acetaminophen Rash and Shortness Of Breath  . Tape Other (See Comments)    Burning feeling. Has left mark on skin.  Melynda Keller [Lorcaserin Hcl] Other (See  Comments)    headache  . Contrave [Naltrexone-Bupropion Hcl Er]   . Dicyclomine Nausea And Vomiting  . Norethindrone Rash    Current Medications: Current Outpatient Prescriptions  Medication Sig Dispense Refill  . amitriptyline (ELAVIL) 25 MG tablet Take 1 tablet (25 mg total) by mouth at bedtime. 30 tablet 0  . docusate sodium (COLACE) 100 MG capsule Take 1 capsule (100 mg total) by mouth daily. (Patient taking differently: Take 100 mg by mouth daily as needed for mild constipation. ) 90 capsule 3  . hydrOXYzine (ATARAX/VISTARIL) 25 MG tablet Take 1 tablet (25 mg total) by mouth 3 (three) times daily as needed. 30 tablet 0  . Insulin Pen Needle (NOVOFINE) 32G X 6 MM MISC 1 each by Does not apply route every morning. 100 each 5  . linaclotide (LINZESS) 145 MCG CAPS capsule Take 1 capsule (145 mcg total) by mouth daily before breakfast. (Patient taking differently: Take 145 mcg by mouth as needed. ) 30 capsule 2  . Liraglutide -Weight Management (SAXENDA) 18 MG/3ML SOPN Inject 3 mg into the skin daily. 5 pen 2  . oseltamivir (TAMIFLU) 75 MG capsule Take 1 capsule (75 mg total) by mouth daily. 7 capsule 0  . triamcinolone cream (KENALOG) 0.1 % Apply 1 application topically 2 (two) times daily. 30 g 0   No current facility-administered medications for this visit.     Review of Systems:  GENERAL:  Feels good.  Active.  No fevers, sweats or weight loss. PERFORMANCE  STATUS (ECOG):  *** HEENT:  No visual changes, runny nose, sore throat, mouth sores or tenderness. Lungs: No shortness of breath or cough.  No hemoptysis. Cardiac:  No chest pain, palpitations, orthopnea, or PND. GI:  No nausea, vomiting, diarrhea, constipation, melena or hematochezia. GU:  No urgency, frequency, dysuria, or hematuria. Musculoskeletal:  No back pain.  No joint pain.  No muscle tenderness. Extremities:  No pain or swelling. Skin:  No rashes or skin changes. Neuro:  No headache, numbness or weakness, balance or  coordination issues. Endocrine:  No diabetes, thyroid issues, hot flashes or night sweats. Psych:  No mood changes, depression or anxiety. Pain:  No focal pain. Review of systems:  All other systems reviewed and found to be negative.  Physical Exam: There were no vitals taken for this visit. GENERAL:  Well developed, well nourished, sitting comfortably in the exam room in no acute distress. MENTAL STATUS:  Alert and oriented to person, place and time. HEAD:  *** hair.  Normocephalic, atraumatic, face symmetric, no Cushingoid features. EYES:  *** eyes.  Pupils equal round and reactive to light and accomodation.  No conjunctivitis or scleral icterus. ENT:  Oropharynx clear without lesion.  Tongue normal. Mucous membranes moist.  RESPIRATORY:  Clear to auscultation without rales, wheezes or rhonchi. CARDIOVASCULAR:  Regular rate and rhythm without murmur, rub or gallop. ABDOMEN:  Soft, non-tender, with active bowel sounds, and no hepatosplenomegaly.  No masses. SKIN:  No rashes, ulcers or lesions. EXTREMITIES: No edema, no skin discoloration or tenderness.  No palpable cords. LYMPH NODES: No palpable cervical, supraclavicular, axillary or inguinal adenopathy  NEUROLOGICAL: Unremarkable. PSYCH:  Appropriate.  No visits with results within 3 Day(s) from this visit.  Latest known visit with results is:  Appointment on 09/21/2016  Component Date Value Ref Range Status  . WBC 09/21/2016 6.4  3.6 - 11.0 K/uL Final  . RBC 09/21/2016 4.38  3.80 - 5.20 MIL/uL Final  . Hemoglobin 09/21/2016 10.5* 12.0 - 16.0 g/dL Final  . HCT 09/21/2016 32.5* 35.0 - 47.0 % Final  . MCV 09/21/2016 74.0* 80.0 - 100.0 fL Final  . MCH 09/21/2016 24.0* 26.0 - 34.0 pg Final  . MCHC 09/21/2016 32.4  32.0 - 36.0 g/dL Final  . RDW 09/21/2016 24.6* 11.5 - 14.5 % Final  . Platelets 09/21/2016 541* 150 - 440 K/uL Final  . Neutrophils Relative % 09/21/2016 57  % Final  . Neutro Abs 09/21/2016 3.6  1.4 - 6.5 K/uL Final  .  Lymphocytes Relative 09/21/2016 33  % Final  . Lymphs Abs 09/21/2016 2.1  1.0 - 3.6 K/uL Final  . Monocytes Relative 09/21/2016 9  % Final  . Monocytes Absolute 09/21/2016 0.6  0.2 - 0.9 K/uL Final  . Eosinophils Relative 09/21/2016 1  % Final  . Eosinophils Absolute 09/21/2016 0.0  0 - 0.7 K/uL Final  . Basophils Relative 09/21/2016 0  % Final  . Basophils Absolute 09/21/2016 0.0  0 - 0.1 K/uL Final  . Ferritin 09/21/2016 214  11 - 307 ng/mL Final    Assessment:  Sharon Herring is a 41 y.o. female ***  Plan: 1.  Discuss 2.  Labs today:  CBC with diff, ferritin, retic. 3.  Preauth Feraheme 4.  Call patient with lab results.  5.  Feraheme weekly x 2 6.  RTC in 1 month for MD assessment and labs (CBC with diff, ferritin- day before)   Lequita Asal, MD  10/06/2016, 5:06 AM

## 2016-10-06 NOTE — Progress Notes (Signed)
referal  

## 2016-10-10 ENCOUNTER — Encounter: Payer: Self-pay | Admitting: Hematology and Oncology

## 2016-10-10 ENCOUNTER — Inpatient Hospital Stay (HOSPITAL_BASED_OUTPATIENT_CLINIC_OR_DEPARTMENT_OTHER): Payer: 59 | Admitting: Hematology and Oncology

## 2016-10-10 ENCOUNTER — Inpatient Hospital Stay: Payer: 59 | Attending: Hematology and Oncology

## 2016-10-10 VITALS — BP 121/81 | HR 71 | Temp 96.7°F | Resp 18 | Wt 212.1 lb

## 2016-10-10 DIAGNOSIS — D508 Other iron deficiency anemias: Secondary | ICD-10-CM

## 2016-10-10 DIAGNOSIS — D500 Iron deficiency anemia secondary to blood loss (chronic): Secondary | ICD-10-CM | POA: Diagnosis not present

## 2016-10-10 DIAGNOSIS — Z79899 Other long term (current) drug therapy: Secondary | ICD-10-CM | POA: Insufficient documentation

## 2016-10-10 DIAGNOSIS — E538 Deficiency of other specified B group vitamins: Secondary | ICD-10-CM

## 2016-10-10 DIAGNOSIS — K589 Irritable bowel syndrome without diarrhea: Secondary | ICD-10-CM | POA: Diagnosis not present

## 2016-10-10 DIAGNOSIS — K219 Gastro-esophageal reflux disease without esophagitis: Secondary | ICD-10-CM | POA: Diagnosis not present

## 2016-10-10 DIAGNOSIS — N920 Excessive and frequent menstruation with regular cycle: Secondary | ICD-10-CM | POA: Insufficient documentation

## 2016-10-10 LAB — CBC WITH DIFFERENTIAL/PLATELET
Basophils Absolute: 0 10*3/uL (ref 0–0.1)
Basophils Relative: 1 %
Eosinophils Absolute: 0.1 10*3/uL (ref 0–0.7)
Eosinophils Relative: 1 %
HCT: 32.4 % — ABNORMAL LOW (ref 35.0–47.0)
Hemoglobin: 10.6 g/dL — ABNORMAL LOW (ref 12.0–16.0)
Lymphocytes Relative: 38 %
Lymphs Abs: 2.6 10*3/uL (ref 1.0–3.6)
MCH: 24.6 pg — ABNORMAL LOW (ref 26.0–34.0)
MCHC: 32.7 g/dL (ref 32.0–36.0)
MCV: 75.4 fL — ABNORMAL LOW (ref 80.0–100.0)
Monocytes Absolute: 0.5 10*3/uL (ref 0.2–0.9)
Monocytes Relative: 7 %
Neutro Abs: 3.6 10*3/uL (ref 1.4–6.5)
Neutrophils Relative %: 53 %
Platelets: 514 10*3/uL — ABNORMAL HIGH (ref 150–440)
RBC: 4.29 MIL/uL (ref 3.80–5.20)
RDW: 24.1 % — ABNORMAL HIGH (ref 11.5–14.5)
WBC: 6.8 10*3/uL (ref 3.6–11.0)

## 2016-10-10 LAB — FERRITIN: Ferritin: 97 ng/mL (ref 11–307)

## 2016-10-10 NOTE — Progress Notes (Signed)
Patient here today for follow up regarding iron deficiency anemia.  Patient states she is not sleeping well and is very tired during the day.

## 2016-10-10 NOTE — Progress Notes (Signed)
[facility] day:  10/10/2016  Chief Complaint: Sharon Herring is a 41 y.o. female with iron deficiency anemia who is seen for reassessment after IV iron.  HPI: The patient was last seen in the hematology clinic on 08/25/2016.  At that time, she was seen for initial assessment.  She had a iron deficiency anemia secondary to heavy menses.  She had been unresponsive to oral iron in the past.  CBC revealed a hematocrit of 28.3, hemoglobin 9.2, and MCV 69.6.  Ferritin was 5.  She received Feraheme 510 mg IV on 08/29/2016 and 09/05/2016.  She had no side effect with the IV iron.  CBC on 09/21/2016 revealed a hematocrit of 32.5, hemoglobin 10.5, and MCV 74.  Ferritin was 214.  Symptomatically, she feels a little sluggish.  Ice pica has resolved.  She notes that the 1st day of her menses is heavy then has normal flow x 5 days.   Past Medical History:  Diagnosis Date  . Anemia   . Dizziness   . Fibroid uterus   . GERD (gastroesophageal reflux disease)   . IBS (irritable bowel syndrome)   . Migraine   . Polyp of stomach 01-06-16   Stomach polyp, pyloric    Past Surgical History:  Procedure Laterality Date  . APPENDECTOMY  2012  . ESOPHAGOGASTRODUODENOSCOPY (EGD) WITH PROPOFOL N/A 01/06/2016   Procedure: ESOPHAGOGASTRODUODENOSCOPY (EGD) WITH PROPOFOL;  Surgeon: Robert Bellow, MD;  Location: [facility] ENDOSCOPY;  Service: Endoscopy;  Laterality: N/A;  . ROBOTIC ASSISTED LAPAROSCOPIC CHOLECYSTECTOMY N/A 03/03/2016   Procedure: ROBOTIC ASSISTED LAPAROSCOPIC CHOLECYSTECTOMY ;  Surgeon: Clayburn Pert, MD;  Location: [facility] ORS;  Service: General;  Laterality: N/A;  . UTERINE FIBROID SURGERY  2012    Family History  Problem Relation Age of Onset  . Hypertension Mother   . Hypertension Father     Social History:  reports that she has never smoked. She has never used smokeless tobacco. She reports that she does not drink alcohol or use drugs.  She  denies any exposure to radiation or toxins.  She works at [employer] as a Psychologist, sport and exercise.  She lives in [place].  The patient is alone today.  Allergies:  Allergies  Allergen Reactions  . Aspirin Nausea And Vomiting  . Oxycodone-Acetaminophen Rash and Shortness Of Breath  . Tape Other (See Comments)    Burning feeling. Has left mark on skin.  Melynda Keller [Lorcaserin Hcl] Other (See Comments)    headache  . Contrave [Naltrexone-Bupropion Hcl Er]   . Dicyclomine Nausea And Vomiting  . Norethindrone Rash    Current Medications: Current Outpatient Prescriptions  Medication Sig Dispense Refill  . amitriptyline (ELAVIL) 25 MG tablet Take 1 tablet (25 mg total) by mouth at bedtime. 30 tablet 0  . docusate sodium (COLACE) 100 MG capsule Take 1 capsule (100 mg total) by mouth daily. (Patient taking differently: Take 100 mg by mouth daily as needed for mild constipation. ) 90 capsule 3  . hydrOXYzine (ATARAX/VISTARIL) 25 MG tablet Take 1 tablet (25 mg total) by mouth 3 (three) times daily as needed. 30 tablet 0  . Insulin Pen Needle (NOVOFINE) 32G X 6 MM MISC 1 each by Does not apply route every morning. 100 each 5  . linaclotide (LINZESS) 145 MCG CAPS capsule Take 1 capsule (145 mcg total) by mouth daily before breakfast. (Patient taking differently: Take 145 mcg by mouth as needed. ) 30 capsule 2  . Liraglutide -Weight Management ([place])  18 MG/3ML SOPN Inject 3 mg into the skin daily. 5 pen 2  . oseltamivir (TAMIFLU) 75 MG capsule Take 1 capsule (75 mg total) by mouth daily. (Patient not taking: Reported on 10/10/2016) 7 capsule 0  . triamcinolone cream (KENALOG) 0.1 % Apply 1 application topically 2 (two) times daily. (Patient not taking: Reported on 10/10/2016) 30 g 0   No current facility-administered medications for this visit.     Review of Systems:  GENERAL:  Feels a little sluggish.  No fevers or sweats.  Weight up 7 pounds. PERFORMANCE STATUS (ECOG):  0 HEENT:  No visual  changes, runny nose, sore throat, mouth sores or tenderness. Lungs: No shortness of breath or cough.  No hemoptysis. Cardiac:  No chest pain, palpitations, orthopnea, or PND. GI:  No nausea, vomiting, diarrhea, constipation, melena or hematochezia. GU:  No urgency, frequency, dysuria, or hematuria.  First day of menses heavy (see HPI). Musculoskeletal:  No back pain.  No joint pain.  No muscle tenderness. Extremities:  No pain or swelling. Skin:  No rashes or skin changes. Neuro:  No headache, numbness or weakness, balance or coordination issues. Endocrine:  No diabetes, thyroid issues, hot flashes or night sweats. Psych:  No mood changes, depression or anxiety. Pain:  No focal pain. Review of systems:  All other systems reviewed and found to be negative.  Physical Exam: Blood pressure 121/81, pulse 71, temperature (!) 96.7 F (35.9 C), temperature source Tympanic, resp. rate 18, weight 212 lb 1.3 oz (96.2 kg). GENERAL:  Well developed, well nourished, woman sitting comfortably in the exam room in no acute distress. MENTAL STATUS:  Alert and oriented to person, place and time. HEAD:  Short brown hair.  Normocephalic, atraumatic, face symmetric, no Cushingoid features. EYES:  Brown eyes.  Pupils equal round and reactive to light and accomodation.  No conjunctivitis or scleral icterus. ENT:  Oropharynx clear without lesion.  Tongue normal. Mucous membranes moist.  RESPIRATORY:  Clear to auscultation without rales, wheezes or rhonchi. CARDIOVASCULAR:  Regular rate and rhythm without murmur, rub or gallop. ABDOMEN:  Soft, non-tender, with active bowel sounds, and no hepatosplenomegaly.  No masses. SKIN:  No rashes, ulcers or lesions. EXTREMITIES: No edema, no skin discoloration or tenderness.  No palpable cords. LYMPH NODES: No palpable cervical, supraclavicular, axillary or inguinal adenopathy  NEUROLOGICAL: Unremarkable. PSYCH:  Appropriate.   Appointment on 10/10/2016  Component Date  Value Ref Range Status  . WBC 10/10/2016 6.8  3.6 - 11.0 K/uL Final  . RBC 10/10/2016 4.29  3.80 - 5.20 MIL/uL Final  . Hemoglobin 10/10/2016 10.6* 12.0 - 16.0 g/dL Final  . HCT 10/10/2016 32.4* 35.0 - 47.0 % Final  . MCV 10/10/2016 75.4* 80.0 - 100.0 fL Final  . MCH 10/10/2016 24.6* 26.0 - 34.0 pg Final  . MCHC 10/10/2016 32.7  32.0 - 36.0 g/dL Final  . RDW 10/10/2016 24.1* 11.5 - 14.5 % Final  . Platelets 10/10/2016 514* 150 - 440 K/uL Final  . Neutrophils Relative % 10/10/2016 53  % Final  . Neutro Abs 10/10/2016 3.6  1.4 - 6.5 K/uL Final  . Lymphocytes Relative 10/10/2016 38  % Final  . Lymphs Abs 10/10/2016 2.6  1.0 - 3.6 K/uL Final  . Monocytes Relative 10/10/2016 7  % Final  . Monocytes Absolute 10/10/2016 0.5  0.2 - 0.9 K/uL Final  . Eosinophils Relative 10/10/2016 1  % Final  . Eosinophils Absolute 10/10/2016 0.1  0 - 0.7 K/uL Final  . Basophils Relative 10/10/2016  1  % Final  . Basophils Absolute 10/10/2016 0.0  0 - 0.1 K/uL Final  . Ferritin 10/10/2016 97  11 - 307 ng/mL Final    Assessment:  Sharon Herring is a 41 y.o. female with iron deficiency anemia secondary to heavy menses.  She has a history of on and off anemia for several years (before 02/2014).  Diet is fairly good.  She denies any melena, hematochezia, or hematuria.  She has had ice pica x 2 months.  She has a history of unresponsiveness to oral iron (4 month trial in 2015).  Labs on 07/21/2016 revealed a hematocrit of 30.9, hemoglobin 9.3, MCV 73.4, platelets 675,000, white count 5300 with an [place] of 2650. Creatinine was 0.7.  Liver function tests were normal.  Iron studies included a ferritin of 6, iron saturation 6% and TIBC of 440.  HIV testing was negative.  Labs on 08/25/2016 revealed a hematocrit of 28.3, hemoglobin 9.2, and MCV 69.6.  Ferritin was 5.  She received Feraheme 510 mg IV on 08/29/2016 and 09/05/2016.  Ferritin goal is 100.  Ferritin has been followed:  18 on 12/02/2015, 6 on 07/21/2016, 5 on  08/25/2016, 214 on 09/21/2016, and 97 on 10/10/2016.  She has B12 deficiency.  She received B12 shots x 2-3 months.  B12 was 462 on 07/21/2016.  Symptomatically, she feels "a little sluggish".  Menses has improved.  Ice pica has resolved.  Exam is unremarkable.  Hematocrit has improved from 28.3 to 32.4.  RBCs remain microcytic.  Ferritin has improved from 5 to 97.  Plan:  1.  Labs today:  CBC with diff, ferritin. 2.  Discuss response to IV iron.  RBC are still microcytic.  Iron stores are adequate.  Discuss plan to recheck labs in 2-3 months and give additional IV iron if needed. 3.  Guaiac cards x 3 4.  RTC in 2 months in labs (CBC with diff, ferritin, iron studies, ESR) 5.  RTC in 4 months for MD assessment, labs (CBC with diff, ferritin, ESR-day before) +/- Feraheme.   Lequita Asal, MD  10/10/2016, 4:25 PM

## 2016-10-18 DIAGNOSIS — L700 Acne vulgaris: Secondary | ICD-10-CM | POA: Diagnosis not present

## 2016-12-08 ENCOUNTER — Inpatient Hospital Stay: Payer: 59 | Attending: Hematology and Oncology

## 2016-12-08 DIAGNOSIS — K589 Irritable bowel syndrome without diarrhea: Secondary | ICD-10-CM | POA: Insufficient documentation

## 2016-12-08 DIAGNOSIS — K219 Gastro-esophageal reflux disease without esophagitis: Secondary | ICD-10-CM | POA: Insufficient documentation

## 2016-12-08 DIAGNOSIS — D500 Iron deficiency anemia secondary to blood loss (chronic): Secondary | ICD-10-CM | POA: Insufficient documentation

## 2016-12-08 DIAGNOSIS — E538 Deficiency of other specified B group vitamins: Secondary | ICD-10-CM | POA: Insufficient documentation

## 2016-12-08 DIAGNOSIS — Z79899 Other long term (current) drug therapy: Secondary | ICD-10-CM | POA: Diagnosis not present

## 2016-12-08 DIAGNOSIS — N920 Excessive and frequent menstruation with regular cycle: Secondary | ICD-10-CM | POA: Diagnosis not present

## 2016-12-08 LAB — CBC WITH DIFFERENTIAL/PLATELET
Basophils Absolute: 0.1 10*3/uL (ref 0–0.1)
Basophils Relative: 1 %
Eosinophils Absolute: 0.1 10*3/uL (ref 0–0.7)
Eosinophils Relative: 1 %
HCT: 31.6 % — ABNORMAL LOW (ref 35.0–47.0)
Hemoglobin: 10.5 g/dL — ABNORMAL LOW (ref 12.0–16.0)
Lymphocytes Relative: 38 %
Lymphs Abs: 2.4 10*3/uL (ref 1.0–3.6)
MCH: 25.7 pg — ABNORMAL LOW (ref 26.0–34.0)
MCHC: 33.1 g/dL (ref 32.0–36.0)
MCV: 77.6 fL — ABNORMAL LOW (ref 80.0–100.0)
Monocytes Absolute: 0.6 10*3/uL (ref 0.2–0.9)
Monocytes Relative: 9 %
Neutro Abs: 3.3 10*3/uL (ref 1.4–6.5)
Neutrophils Relative %: 51 %
Platelets: 470 10*3/uL — ABNORMAL HIGH (ref 150–440)
RBC: 4.08 MIL/uL (ref 3.80–5.20)
RDW: 19 % — ABNORMAL HIGH (ref 11.5–14.5)
WBC: 6.4 10*3/uL (ref 3.6–11.0)

## 2016-12-08 LAB — FERRITIN: Ferritin: 21 ng/mL (ref 11–307)

## 2016-12-08 LAB — IRON AND TIBC
Iron: 57 ug/dL (ref 28–170)
Saturation Ratios: 16 % (ref 10.4–31.8)
TIBC: 347 ug/dL (ref 250–450)
UIBC: 291 ug/dL

## 2016-12-08 LAB — SEDIMENTATION RATE: Sed Rate: 26 mm/hr — ABNORMAL HIGH (ref 0–20)

## 2016-12-09 ENCOUNTER — Other Ambulatory Visit: Payer: Self-pay | Admitting: *Deleted

## 2016-12-09 DIAGNOSIS — D508 Other iron deficiency anemias: Secondary | ICD-10-CM

## 2016-12-12 ENCOUNTER — Telehealth: Payer: Self-pay | Admitting: *Deleted

## 2016-12-12 ENCOUNTER — Other Ambulatory Visit: Payer: Self-pay | Admitting: *Deleted

## 2016-12-12 NOTE — Telephone Encounter (Signed)
Called patient to discuss iron levels and need for rechecking iron next month, message left.

## 2017-01-04 ENCOUNTER — Ambulatory Visit (INDEPENDENT_AMBULATORY_CARE_PROVIDER_SITE_OTHER): Payer: 59 | Admitting: Family Medicine

## 2017-01-04 ENCOUNTER — Encounter: Payer: Self-pay | Admitting: Family Medicine

## 2017-01-04 VITALS — BP 120/70 | HR 91 | Temp 98.1°F | Resp 16 | Ht 65.0 in | Wt 182.3 lb

## 2017-01-04 DIAGNOSIS — K5909 Other constipation: Secondary | ICD-10-CM | POA: Diagnosis not present

## 2017-01-04 DIAGNOSIS — E669 Obesity, unspecified: Secondary | ICD-10-CM | POA: Diagnosis not present

## 2017-01-04 DIAGNOSIS — R739 Hyperglycemia, unspecified: Secondary | ICD-10-CM

## 2017-01-04 DIAGNOSIS — Z86018 Personal history of other benign neoplasm: Secondary | ICD-10-CM | POA: Diagnosis not present

## 2017-01-04 DIAGNOSIS — E66811 Obesity, class 1: Secondary | ICD-10-CM

## 2017-01-04 DIAGNOSIS — N920 Excessive and frequent menstruation with regular cycle: Secondary | ICD-10-CM

## 2017-01-04 DIAGNOSIS — E559 Vitamin D deficiency, unspecified: Secondary | ICD-10-CM

## 2017-01-04 DIAGNOSIS — D500 Iron deficiency anemia secondary to blood loss (chronic): Secondary | ICD-10-CM

## 2017-01-04 DIAGNOSIS — E538 Deficiency of other specified B group vitamins: Secondary | ICD-10-CM | POA: Diagnosis not present

## 2017-01-04 MED ORDER — CYANOCOBALAMIN 1000 MCG/ML IJ SOLN
1000.0000 ug | Freq: Once | INTRAMUSCULAR | Status: AC
  Administered 2017-01-04: 1000 ug via INTRAMUSCULAR

## 2017-01-04 MED ORDER — VITAMIN D 1000 UNITS PO TABS: 1000.0000 [IU] | ORAL_TABLET | Freq: Every day | ORAL | 0 refills | Status: DC

## 2017-01-04 MED ORDER — VITAMIN D (ERGOCALCIFEROL) 1.25 MG (50000 UNIT) PO CAPS: 50000.0000 [IU] | ORAL_CAPSULE | ORAL | 0 refills | Status: DC

## 2017-01-04 MED ORDER — CYANOCOBALAMIN 1000 MCG/ML IJ SOLN: 1000.0000 ug | Freq: Once | INTRAMUSCULAR | Status: DC

## 2017-01-04 MED ORDER — FERROUS SULFATE 325 (65 FE) MG PO TABS: 325.0000 mg | ORAL_TABLET | ORAL | 0 refills | Status: DC

## 2017-01-04 MED ORDER — MULTIVITAMIN ADULT PO TABS: 1.0000 | ORAL_TABLET | Freq: Every day | ORAL | 0 refills | Status: AC

## 2017-01-04 NOTE — Progress Notes (Signed)
Name: Sharon Herring   MRN: [id number]    DOB: 1975/09/13   Date:01/04/2017       Progress Note  Subjective  Chief Complaint  Chief Complaint  Patient presents with  . Referral    HPI    Obesity/Prediabetes:  she states she started to gain  weight at age 41. She states it happened when her father was diagnosed with brain cancer and she was eating because of stress. The highest weight was 221 lbs. She had tried exercise and portion control without much help. She has also tried Phentermine and lost 8-9 lbs and was able to keep it down for about one year. She has also tried Contrave that made her feel groggy and Belviq that caused headaches. She started Saxenda at a weight of 217 lbs on 03/28/2016. Her weight went as low as 170 lbs, but she stopped using it for a few months and gained about 15 lbs , she resumed medication last week. Her current weight is 183 lbs. She has been more water, eating smaller portions and has been walking some.   Menorrhagia: she had uterine fibroids removed 2011, he still has heavy cycles, lasts about 1 week, regular, about every 28 days, this past cycle was the heaviest and she decided to come in for follow up. Not taking ocp. She was not able to tolerate ocp, she broke out in a rash and does not want to try anything else at this time. She also has iron deficiency anemia and sees Hematologist Dr. Mike Gip, had iron infusion and iron storage has improved, taking otc iron tablets a few times a week, but still anemic  B12 and Vitamin D deficiency: taking otc vitamin D, last level was very low, we will send rx today, also advised to get B12 injection, and may continue with SL B12 after that   Chronic constipation: she states she is doing well, bowel movements once a day, taking Linzess only a few times a month. No straining, or blood in stools.   Patient Active Problem List   Diagnosis Date Noted  . B12 deficiency 03/28/2016  . Vitamin D deficiency 03/28/2016  .  Hyperglycemia 03/28/2016  . Multiple gastric polyps 01/08/2016  . Menorrhagia 11/13/2015  . Chronic constipation 09/23/2015  . Obesity, Class I, BMI 30.0-34.9 (see actual BMI) 06/02/2015  . Iron deficiency anemia 03/14/2014  . History of uterine fibroid 03/14/2014    Past Surgical History:  Procedure Laterality Date  . APPENDECTOMY  2012  . ESOPHAGOGASTRODUODENOSCOPY (EGD) WITH PROPOFOL N/A 01/06/2016   Procedure: ESOPHAGOGASTRODUODENOSCOPY (EGD) WITH PROPOFOL;  Surgeon: Robert Bellow, MD;  Location: [facility];  Service: Endoscopy;  Laterality: N/A;  . ROBOTIC ASSISTED LAPAROSCOPIC CHOLECYSTECTOMY N/A 03/03/2016   Procedure: ROBOTIC ASSISTED LAPAROSCOPIC CHOLECYSTECTOMY ;  Surgeon: Clayburn Pert, MD;  Location: [facility] ORS;  Service: General;  Laterality: N/A;  . UTERINE FIBROID SURGERY  2012    Family History  Problem Relation Age of Onset  . Hypertension Mother   . Hypertension Father     Social History   Social History  . Marital status: Single    Spouse name: N/A  . Number of children: N/A  . Years of education: N/A   Occupational History  . Not on file.   Social History Main Topics  . Smoking status: Never Smoker  . Smokeless tobacco: Never Used  . Alcohol use No  . Drug use: No  . Sexual activity: Not Currently   Other Topics Concern  . Not  on file   Social History Narrative  . No narrative on file     Current Outpatient Prescriptions:  .  amitriptyline (ELAVIL) 25 MG tablet, Take 1 tablet (25 mg total) by mouth at bedtime., Disp: 30 tablet, Rfl: 0 .  Insulin Pen Needle (NOVOFINE) 32G X 6 MM MISC, 1 each by Does not apply route every morning., Disp: 100 each, Rfl: 5 .  linaclotide (LINZESS) 145 MCG CAPS capsule, Take 1 capsule (145 mcg total) by mouth daily before breakfast. (Patient taking differently: Take 145 mcg by mouth as needed. ), Disp: 30 capsule, Rfl: 2 .  Liraglutide -Weight Management (SAXENDA) 18 MG/3ML SOPN, Inject 3 mg into the skin  daily., Disp: 5 pen, Rfl: 2 .  BENZEPRO FOAMING CLOTHS 6 % MISC, Apply 1 each topically every other day., Disp: , Rfl:  .  cholecalciferol (VITAMIN D) 1000 units tablet, Take 1 tablet (1,000 Units total) by mouth daily., Disp: 30 tablet, Rfl: 0 .  ferrous sulfate 325 (65 FE) MG tablet, Take 1 tablet (325 mg total) by mouth every other day., Disp: 30 tablet, Rfl: 0 .  Multiple Vitamins-Minerals (MULTIVITAMIN ADULT) TABS, Take 1 tablet by mouth daily., Disp: 30 tablet, Rfl: 0 .  SULFACLEANSE 8/4 8-4 % SUSP, Apply 1 drop topically daily., Disp: , Rfl:  .  Vitamin D, Ergocalciferol, (DRISDOL) 50000 units CAPS capsule, Take 1 capsule (50,000 Units total) by mouth every 7 (seven) days., Disp: 12 capsule, Rfl: 0  Current Facility-Administered Medications:  .  cyanocobalamin ((VITAMIN B-12)) injection 1,000 mcg, 1,000 mcg, Intramuscular, Once, Steele Sizer, MD  Allergies  Allergen Reactions  . Aspirin Nausea And Vomiting  . Oxycodone-Acetaminophen Rash and Shortness Of Breath  . Tape Other (See Comments)    Burning feeling. Has left mark on skin.  Melynda Keller [Lorcaserin Hcl] Other (See Comments)    headache  . Contrave [Naltrexone-Bupropion Hcl Er]   . Dicyclomine Nausea And Vomiting  . Norethindrone Rash     ROS  Constitutional: Negative for fever, positive for  weight change.  Respiratory: Negative for cough and shortness of breath.   Cardiovascular: Negative for chest pain or palpitations.  Gastrointestinal: Negative for abdominal pain, no bowel changes.  Musculoskeletal: Negative for gait problem or joint swelling.  Skin: Negative for rash.  Neurological: Negative for dizziness, occasionally has headache.  No other specific complaints in a complete review of systems (except as listed in HPI above).  Objective  Vitals:   01/04/17 0759  BP: 120/70  Pulse: 91  Resp: 16  Temp: 98.1 F (36.7 C)  TempSrc: Oral  SpO2: 98%  Weight: 182 lb 4.8 oz (82.7 kg)  Height: 5\' 5"  (1.651 m)     Body mass index is 30.34 kg/m.  Physical Exam  Constitutional: Patient appears well-developed and well-nourished. Obese No distress.  HEENT: head atraumatic, normocephalic, pupils equal and reactive to light,  neck supple, throat within normal limits Cardiovascular: Normal rate, regular rhythm and normal heart sounds.  No murmur heard. Trace  BLE edema. Pulmonary/Chest: Effort normal and breath sounds normal. No respiratory distress. Abdominal: Soft.  There is no tenderness. Psychiatric: Patient has a normal mood and affect. behavior is normal. Judgment and thought content normal.  Recent Results (from the past 2160 hour(s))  CBC with Differential     Status: Abnormal   Collection Time: 10/10/16  3:15 PM  Result Value Ref Range   WBC 6.8 3.6 - 11.0 K/uL   RBC 4.29 3.80 - 5.20 MIL/uL  Hemoglobin 10.6 (L) 12.0 - 16.0 g/dL   HCT 32.4 (L) 35.0 - 47.0 %   MCV 75.4 (L) 80.0 - 100.0 fL   MCH 24.6 (L) 26.0 - 34.0 pg   MCHC 32.7 32.0 - 36.0 g/dL   RDW 24.1 (H) 11.5 - 14.5 %   Platelets 514 (H) 150 - 440 K/uL   Neutrophils Relative % 53 %   Neutro Abs 3.6 1.4 - 6.5 K/uL   Lymphocytes Relative 38 %   Lymphs Abs 2.6 1.0 - 3.6 K/uL   Monocytes Relative 7 %   Monocytes Absolute 0.5 0.2 - 0.9 K/uL   Eosinophils Relative 1 %   Eosinophils Absolute 0.1 0 - 0.7 K/uL   Basophils Relative 1 %   Basophils Absolute 0.0 0 - 0.1 K/uL  Ferritin     Status: None   Collection Time: 10/10/16  3:15 PM  Result Value Ref Range   Ferritin 97 11 - 307 ng/mL  CBC with Differential/Platelet     Status: Abnormal   Collection Time: 12/08/16  8:20 AM  Result Value Ref Range   WBC 6.4 3.6 - 11.0 K/uL   RBC 4.08 3.80 - 5.20 MIL/uL   Hemoglobin 10.5 (L) 12.0 - 16.0 g/dL   HCT 31.6 (L) 35.0 - 47.0 %   MCV 77.6 (L) 80.0 - 100.0 fL   MCH 25.7 (L) 26.0 - 34.0 pg   MCHC 33.1 32.0 - 36.0 g/dL   RDW 19.0 (H) 11.5 - 14.5 %   Platelets 470 (H) 150 - 440 K/uL   Neutrophils Relative % 51 %   Neutro Abs 3.3  1.4 - 6.5 K/uL   Lymphocytes Relative 38 %   Lymphs Abs 2.4 1.0 - 3.6 K/uL   Monocytes Relative 9 %   Monocytes Absolute 0.6 0.2 - 0.9 K/uL   Eosinophils Relative 1 %   Eosinophils Absolute 0.1 0 - 0.7 K/uL   Basophils Relative 1 %   Basophils Absolute 0.1 0 - 0.1 K/uL  Ferritin     Status: None   Collection Time: 12/08/16  8:20 AM  Result Value Ref Range   Ferritin 21 11 - 307 ng/mL  Iron and TIBC     Status: None   Collection Time: 12/08/16  8:20 AM  Result Value Ref Range   Iron 57 28 - 170 ug/dL   TIBC 347 250 - 450 ug/dL   Saturation Ratios 16 10.4 - 31.8 %   UIBC 291 ug/dL  Sedimentation rate     Status: Abnormal   Collection Time: 12/08/16  8:20 AM  Result Value Ref Range   Sed Rate 26 (H) 0 - 20 mm/hr     PHQ2/9: Depression screen [facility] 2/9 03/28/2016 12/16/2015 11/13/2015 06/02/2015 05/12/2015  Decreased Interest 0 0 0 0 0  Down, Depressed, Hopeless 0 0 0 0 0  PHQ - 2 Score 0 0 0 0 0     Fall Risk: Fall Risk  03/28/2016 12/16/2015 11/13/2015 06/02/2015 05/12/2015  Falls in the past year? No No No No No      Assessment & Plan  1. Menorrhagia with regular cycle  - Ambulatory referral to Obstetrics / Gynecology  2. History of uterine fibroid  - Ambulatory referral to Obstetrics / Gynecology  3. Iron deficiency anemia due to chronic blood loss  - ferrous sulfate 325 (65 FE) MG tablet; Take 1 tablet (325 mg total) by mouth every other day.  Dispense: 30 tablet; Refill: 0  4. Obesity, Class I,  BMI 30.0-34.9 (see actual BMI)  Continue Saxenda, she still has refills at pharmacy   5. Vitamin D deficiency  - cholecalciferol (VITAMIN D) 1000 units tablet; Take 1 tablet (1,000 Units total) by mouth daily.  Dispense: 30 tablet; Refill: 0 - Vitamin D, Ergocalciferol, (DRISDOL) 50000 units CAPS capsule; Take 1 capsule (50,000 Units total) by mouth every 7 (seven) days.  Dispense: 12 capsule; Refill: 0  6. B12 deficiency  - cyanocobalamin ((VITAMIN B-12)) injection  1,000 mcg; Inject 1 mL (1,000 mcg total) into the muscle once.  7. Chronic constipation  Doing well at this time  8. Hyperglycemia  Continue life style modification

## 2017-01-04 NOTE — Addendum Note (Signed)
Addended by: Saunders Glance A on: 01/04/2017 09:06 AM   Modules accepted: Orders

## 2017-01-09 ENCOUNTER — Inpatient Hospital Stay: Payer: 59 | Attending: Hematology and Oncology

## 2017-01-19 ENCOUNTER — Other Ambulatory Visit: Payer: Self-pay

## 2017-01-19 MED ORDER — TRIAMCINOLONE ACETONIDE 0.1 % EX CREA: 1.0000 "application " | TOPICAL_CREAM | Freq: Two times a day (BID) | CUTANEOUS | 0 refills | Status: DC

## 2017-01-25 ENCOUNTER — Encounter: Payer: Self-pay | Admitting: Obstetrics and Gynecology

## 2017-01-25 ENCOUNTER — Ambulatory Visit (INDEPENDENT_AMBULATORY_CARE_PROVIDER_SITE_OTHER): Payer: 59 | Admitting: Obstetrics and Gynecology

## 2017-01-25 VITALS — BP 118/75 | HR 83 | Ht 65.0 in | Wt 223.2 lb

## 2017-01-25 DIAGNOSIS — D251 Intramural leiomyoma of uterus: Secondary | ICD-10-CM | POA: Diagnosis not present

## 2017-01-25 DIAGNOSIS — N920 Excessive and frequent menstruation with regular cycle: Secondary | ICD-10-CM

## 2017-01-25 NOTE — Progress Notes (Signed)
HPI:      [name] is a 41 y.o. G0P0000 who LMP was Patient's last menstrual period was 01/23/2017 (exact date).  Subjective:   She presents today Stating that she has always had very heavy menstrual periods but the last 2 have gotten worse. She has a known history of uterine fibroids and has previously had fibroidectomy surgery. She has never been pregnant but would like to retain the possibility of childbirth as "long as possible." She also complains of significant lower extremity edema that occurs only with her menstrual periods. This is new for her.    Hx: The following portions of the patient's history were reviewed and updated as appropriate:             She  has a past medical history of Anemia; Dizziness; Fibroid uterus; GERD (gastroesophageal reflux disease); IBS (irritable bowel syndrome); Migraine; and Polyp of stomach (01-06-16). She  does not have any pertinent problems on file. She  has a past surgical history that includes Uterine fibroid surgery (2012); Appendectomy (2012); Esophagogastroduodenoscopy (egd) with propofol (N/A, 01/06/2016); and Robotic assisted laparoscopic cholecystectomy (N/A, 03/03/2016). Her family history includes Hypertension in her father and mother. She  reports that she has never smoked. She has never used smokeless tobacco. She reports that she does not drink alcohol or use drugs. She is allergic to aspirin; oxycodone-acetaminophen; tape; belviq [lorcaserin hcl]; contrave [naltrexone-bupropion hcl er]; dicyclomine; and norethindrone.       Review of Systems:  Review of Systems  Constitutional: Denied constitutional symptoms, night sweats, recent illness, fatigue, fever, insomnia and weight loss.  Eyes: Denied eye symptoms, eye pain, photophobia, vision change and visual disturbance.  Ears/Nose/Throat/Neck: Denied ear, nose, throat or neck symptoms, hearing loss, nasal discharge, sinus congestion and sore throat.  Cardiovascular: Denied  cardiovascular symptoms, arrhythmia, chest pain/pressure, edema, exercise intolerance, orthopnea and palpitations.  Respiratory: Denied pulmonary symptoms, asthma, pleuritic pain, productive sputum, cough, dyspnea and wheezing.  Gastrointestinal: Denied, gastro-esophageal reflux, melena, nausea and vomiting.  Genitourinary: See HPI for additional information.  Musculoskeletal: Denied musculoskeletal symptoms, stiffness, swelling, muscle weakness and myalgia.  Dermatologic: Denied dermatology symptoms, rash and scar.  Neurologic: Denied neurology symptoms, dizziness, headache, neck pain and syncope.  Psychiatric: Denied psychiatric symptoms, anxiety and depression.  Endocrine: Denied endocrine symptoms including hot flashes and night sweats.   Meds:   Current Outpatient Prescriptions on File Prior to Visit  Medication Sig Dispense Refill  . amitriptyline (ELAVIL) 25 MG tablet Take 1 tablet (25 mg total) by mouth at bedtime. 30 tablet 0  . BENZEPRO FOAMING CLOTHS 6 % MISC Apply 1 each topically every other day.    . cholecalciferol (VITAMIN D) 1000 units tablet Take 1 tablet (1,000 Units total) by mouth daily. 30 tablet 0  . ferrous sulfate 325 (65 FE) MG tablet Take 1 tablet (325 mg total) by mouth every other day. 30 tablet 0  . Insulin Pen Needle (NOVOFINE) 32G X 6 MM MISC 1 each by Does not apply route every morning. 100 each 5  . linaclotide (LINZESS) 145 MCG CAPS capsule Take 1 capsule (145 mcg total) by mouth daily before breakfast. (Patient taking differently: Take 145 mcg by mouth as needed. ) 30 capsule 2  . Liraglutide -Weight Management (SAXENDA) 18 MG/3ML SOPN Inject 3 mg into the skin daily. 5 pen 2  . Multiple Vitamins-Minerals (MULTIVITAMIN ADULT) TABS Take 1 tablet by mouth daily. 30 tablet 0  . SULFACLEANSE 8/4 8-4 % SUSP Apply 1 drop topically  daily.    . triamcinolone cream (KENALOG) 0.1 % Apply 1 application topically 2 (two) times daily. 30 g 0  . Vitamin D,  Ergocalciferol, (DRISDOL) 50000 units CAPS capsule Take 1 capsule (50,000 Units total) by mouth every 7 (seven) days. 12 capsule 0   No current facility-administered medications on file prior to visit.     Objective:     Vitals:   01/25/17 1340  BP: 118/75  Pulse: 83              Abdominal examination reveals no obvious masses. Abdomen is soft nontender.  Assessment:    G0P0000 Patient Active Problem List   Diagnosis Date Noted  . B12 deficiency 03/28/2016  . Vitamin D deficiency 03/28/2016  . Hyperglycemia 03/28/2016  . Multiple gastric polyps 01/08/2016  . Menorrhagia 11/13/2015  . Chronic constipation 09/23/2015  . Obesity, Class I, BMI 30.0-34.9 (see actual BMI) 06/02/2015  . Iron deficiency anemia 03/14/2014  . History of uterine fibroid 03/14/2014     1. Fibroids, intramural   2. Menorrhagia with regular cycle        Plan:            1.  We have discussed multiple options regarding trial of menstrual cycles. We have also discussed her peripheral edema in detail.  2.  Pelvic ultrasound to identify number and size of uterine fibroids.  3.  We will discuss her ultrasound findings with a return and decide management at that time. Orders Orders Placed This Encounter  Procedures  . US Transvaginal Non-OB    No orders of the defined types were placed in this encounter.       F/U  Return for We will contact her with any abnormal test results.  Finis Bud, M.D. 01/25/2017 2:15 PM

## 2017-01-26 ENCOUNTER — Encounter: Payer: Self-pay | Admitting: Family Medicine

## 2017-01-26 ENCOUNTER — Ambulatory Visit (INDEPENDENT_AMBULATORY_CARE_PROVIDER_SITE_OTHER): Payer: 59 | Admitting: Family Medicine

## 2017-01-26 VITALS — BP 128/84 | HR 87 | Temp 97.9°F | Resp 16 | Ht 65.0 in | Wt 218.0 lb

## 2017-01-26 DIAGNOSIS — M79662 Pain in left lower leg: Secondary | ICD-10-CM

## 2017-01-26 DIAGNOSIS — R600 Localized edema: Secondary | ICD-10-CM | POA: Diagnosis not present

## 2017-01-26 NOTE — Progress Notes (Addendum)
Name: Sharon Herring   MRN: [id number]    DOB: August 21, 1976   Date:01/26/2017       Progress Note  Subjective  Chief Complaint  Chief Complaint  Patient presents with  . Foot Swelling    HPI  Pt presents with new onset BLE edema x2 days. She notes that she had some edema last month during her menses, and swelling occurred again this month at the onset of her menses 2 days ago. Notes swelling is painful to BLE. She notes that vaginal bleeding is much heavier this month, and the swelling in BLE is worse than last month too.  She saw OB/GYN yesterday and was told to return in about a week for Pelvic US to address the increased bleeding - has hx uterine fibroids.  She did take 40mg  Lasix from a family member yesterday and says this did not cause increased urination or decrease in swelling.  No hand or abdominal swelling, no chest pain, shortness of breath, or coughing.  She wore compression socks for a few hours and it seemed to help the swelling.  Pt also notes mild left calf pain. She traveled 6 days ago and 4 days ago for about 4 hours in the car each day, but otherwise no recent long sedentary episodes. No redness on calf, swelling to left ankle/foot as above. No shortness of breath or chest pain, no coughing.   Patient Active Problem List   Diagnosis Date Noted  . B12 deficiency 03/28/2016  . Vitamin D deficiency 03/28/2016  . Hyperglycemia 03/28/2016  . Multiple gastric polyps 01/08/2016  . Menorrhagia 11/13/2015  . Chronic constipation 09/23/2015  . Obesity, Class I, BMI 30.0-34.9 (see actual BMI) 06/02/2015  . Iron deficiency anemia 03/14/2014  . History of uterine fibroid 03/14/2014    Social History  Substance Use Topics  . Smoking status: Never Smoker  . Smokeless tobacco: Never Used  . Alcohol use No     Current Outpatient Prescriptions:  .  amitriptyline (ELAVIL) 25 MG tablet, Take 1 tablet (25 mg total) by mouth at bedtime., Disp: 30 tablet, Rfl: 0 .  BENZEPRO FOAMING  CLOTHS 6 % MISC, Apply 1 each topically every other day., Disp: , Rfl:  .  cholecalciferol (VITAMIN D) 1000 units tablet, Take 1 tablet (1,000 Units total) by mouth daily., Disp: 30 tablet, Rfl: 0 .  ferrous sulfate 325 (65 FE) MG tablet, Take 1 tablet (325 mg total) by mouth every other day., Disp: 30 tablet, Rfl: 0 .  Insulin Pen Needle (NOVOFINE) 32G X 6 MM MISC, 1 each by Does not apply route every morning., Disp: 100 each, Rfl: 5 .  linaclotide (LINZESS) 145 MCG CAPS capsule, Take 1 capsule (145 mcg total) by mouth daily before breakfast. (Patient taking differently: Take 145 mcg by mouth as needed. ), Disp: 30 capsule, Rfl: 2 .  Liraglutide -Weight Management (SAXENDA) 18 MG/3ML SOPN, Inject 3 mg into the skin daily., Disp: 5 pen, Rfl: 2 .  Multiple Vitamins-Minerals (MULTIVITAMIN ADULT) TABS, Take 1 tablet by mouth daily., Disp: 30 tablet, Rfl: 0 .  SULFACLEANSE 8/4 8-4 % SUSP, Apply 1 drop topically daily., Disp: , Rfl:  .  triamcinolone cream (KENALOG) 0.1 %, Apply 1 application topically 2 (two) times daily., Disp: 30 g, Rfl: 0 .  Vitamin D, Ergocalciferol, (DRISDOL) 50000 units CAPS capsule, Take 1 capsule (50,000 Units total) by mouth every 7 (seven) days., Disp: 12 capsule, Rfl: 0  Allergies  Allergen Reactions  . Aspirin Nausea And  Vomiting  . Oxycodone-Acetaminophen Rash and Shortness Of Breath  . Tape Other (See Comments)    Burning feeling. Has left mark on skin.  Melynda Keller [Lorcaserin Hcl] Other (See Comments)    headache  . Contrave [Naltrexone-Bupropion Hcl Er]   . Dicyclomine Nausea And Vomiting  . Norethindrone Rash    ROS Ten systems reviewed and is negative except as mentioned in HPI Objective  Vitals:   01/26/17 1024  BP: 128/84  Pulse: 87  Resp: 16  Temp: 97.9 F (36.6 C)  TempSrc: Oral  SpO2: 97%  Weight: 218 lb (98.9 kg)  Height: 5\' 5"  (1.651 m)    Body mass index is 36.28 kg/m.  Nursing Note and Vital Signs reviewed.  Physical  Exam  Constitutional: Patient appears well-developed and well-nourished. Obese No distress.  HEENT: head atraumatic, normocephalic Cardiovascular: Normal rate, regular rhythm, S1/S2 present.  No murmur or rub heard. Pulmonary/Chest: Effort normal and breath sounds clear. No respiratory distress or retractions. MSK: bilateral foot and ankle edema present, non-pitting, worse on the left side. Left calf exhibits mild tenderness on palpation with no erythema or heat and no swelling. Strong right pedal pulse, & moderate left pedal pulse, bilateral cap refill <3 seconds Psychiatric: Patient has a normal mood and affect. behavior is normal. Judgment and thought content normal.  Recent Results (from the past 2160 hour(s))  CBC with Differential/Platelet     Status: Abnormal   Collection Time: 12/08/16  8:20 AM  Result Value Ref Range   WBC 6.4 3.6 - 11.0 K/uL   RBC 4.08 3.80 - 5.20 MIL/uL   Hemoglobin 10.5 (L) 12.0 - 16.0 g/dL   HCT 31.6 (L) 35.0 - 47.0 %   MCV 77.6 (L) 80.0 - 100.0 fL   MCH 25.7 (L) 26.0 - 34.0 pg   MCHC 33.1 32.0 - 36.0 g/dL   RDW 19.0 (H) 11.5 - 14.5 %   Platelets 470 (H) 150 - 440 K/uL   Neutrophils Relative % 51 %   Neutro Abs 3.3 1.4 - 6.5 K/uL   Lymphocytes Relative 38 %   Lymphs Abs 2.4 1.0 - 3.6 K/uL   Monocytes Relative 9 %   Monocytes Absolute 0.6 0.2 - 0.9 K/uL   Eosinophils Relative 1 %   Eosinophils Absolute 0.1 0 - 0.7 K/uL   Basophils Relative 1 %   Basophils Absolute 0.1 0 - 0.1 K/uL  Ferritin     Status: None   Collection Time: 12/08/16  8:20 AM  Result Value Ref Range   Ferritin 21 11 - 307 ng/mL  Iron and TIBC     Status: None   Collection Time: 12/08/16  8:20 AM  Result Value Ref Range   Iron 57 28 - 170 ug/dL   TIBC 347 250 - 450 ug/dL   Saturation Ratios 16 10.4 - 31.8 %   UIBC 291 ug/dL  Sedimentation rate     Status: Abnormal   Collection Time: 12/08/16  8:20 AM  Result Value Ref Range   Sed Rate 26 (H) 0 - 20 mm/hr     Assessment &  Plan  1. Bilateral edema of lower extremity - COMPLETE METABOLIC PANEL WITH GFR - B Nat Peptide - Ambulatory referral to Vascular Surgery -Discussed possible etiologies including idiopathic premenstrual edema, but due to a lack of chronicity, further work-up is ordered.  2. Pain of left calf - Ambulatory referral to Vascular Surgery  - Advised due to new onset edema and left calf pain with  unclear etiology, vein and vascular referral is placed as urgent. Pt is agreeable to plan of care and appointment is pending.  - Once CMP result is returned, we will consider Furosemide treatment while awaiting Vein and Vascular appointment. -Red flags and when to present for emergency care or RTC discussed at length with patient including fever >101.20F, chest pain, shortness of breath, new/worsening/un-resolving symptoms, swelling or increased pain of left calf reviewed with patient at time of visit. Follow up and care instructions discussed and provided in AVS.  I have reviewed this encounter including the documentation in this note and/or discussed this patient with the Johney Maine, FNP, NP-C. I am certifying that I agree with the content of this note as supervising physician.  Steele Sizer, MD [facility] 02/15/2017, 5:03 PM

## 2017-01-27 ENCOUNTER — Encounter (INDEPENDENT_AMBULATORY_CARE_PROVIDER_SITE_OTHER): Payer: Self-pay | Admitting: Vascular Surgery

## 2017-01-27 ENCOUNTER — Ambulatory Visit (INDEPENDENT_AMBULATORY_CARE_PROVIDER_SITE_OTHER): Payer: 59 | Admitting: Vascular Surgery

## 2017-01-27 VITALS — BP 132/90 | HR 76 | Resp 17 | Ht 65.0 in | Wt 221.0 lb

## 2017-01-27 DIAGNOSIS — Z86018 Personal history of other benign neoplasm: Secondary | ICD-10-CM | POA: Diagnosis not present

## 2017-01-27 DIAGNOSIS — M7989 Other specified soft tissue disorders: Secondary | ICD-10-CM | POA: Diagnosis not present

## 2017-01-27 LAB — COMPLETE METABOLIC PANEL WITH GFR
ALBUMIN: 3.7 g/dL (ref 3.6–5.1)
ALK PHOS: 64 U/L (ref 33–115)
ALT: 10 U/L (ref 6–29)
AST: 11 U/L (ref 10–30)
BUN: 10 mg/dL (ref 7–25)
CALCIUM: 8.9 mg/dL (ref 8.6–10.2)
CHLORIDE: 104 mmol/L (ref 98–110)
CO2: 24 mmol/L (ref 20–31)
CREATININE: 0.68 mg/dL (ref 0.50–1.10)
GFR, Est African American: >89 mL/min (ref >=60)
GFR, Est Non African American: >89 mL/min (ref >=60)
GLUCOSE: 93 mg/dL (ref 65–99)
Potassium: 4.3 mmol/L (ref 3.5–5.3)
SODIUM: 137 mmol/L (ref 135–146)
Total Bilirubin: 0.3 mg/dL (ref 0.2–1.2)
Total Protein: 7 g/dL (ref 6.1–8.1)

## 2017-01-27 LAB — BRAIN NATRIURETIC PEPTIDE: BRAIN NATRIURETIC PEPTIDE: 15.1 pg/mL (ref <100)

## 2017-01-27 NOTE — Assessment & Plan Note (Signed)
Heavy menses are present. Seems to be cyclically related to her leg swelling although this is not entirely clear in its etiology.

## 2017-01-27 NOTE — Assessment & Plan Note (Signed)
Etiology unclear. See workup as below.

## 2017-01-27 NOTE — Progress Notes (Signed)
Patient ID: Sharon Herring, female   DOB: 1975-12-04, 41 y.o.   MRN: [id number]  Chief Complaint  Patient presents with  . New Patient (Initial Visit)    HPI Sharon Herring is a 41 y.o. female.  I am asked to see the patient by Raelyn Ensign, NP for evaluation of leg swelling.  The patient reports earlier this week noticing prominent swelling of her lower extremities worse on the left than the right. This correlated with her cycle and was similar to an issue she had last month, but significantly worse. Other than her menstrual cycle, there was no clear associated event or causative factor. Her right leg has gotten better with compression stockings but her left leg has not. She denies any trauma or injury. She has had no recent surgery. She does not have ulceration or infection The patient has no previous history of deep venous thrombosis or superficial thrombophlebitis to their knowledge.     Past Medical History:  Diagnosis Date  . Anemia   . Dizziness   . Fibroid uterus   . GERD (gastroesophageal reflux disease)   . IBS (irritable bowel syndrome)   . Migraine   . Polyp of stomach 01-06-16   Stomach polyp, pyloric    Past Surgical History:  Procedure Laterality Date  . APPENDECTOMY  2012  . ESOPHAGOGASTRODUODENOSCOPY (EGD) WITH PROPOFOL N/A 01/06/2016   Procedure: ESOPHAGOGASTRODUODENOSCOPY (EGD) WITH PROPOFOL;  Surgeon: Robert Bellow, MD;  Location: [facility];  Service: Endoscopy;  Laterality: N/A;  . ROBOTIC ASSISTED LAPAROSCOPIC CHOLECYSTECTOMY N/A 03/03/2016   Procedure: ROBOTIC ASSISTED LAPAROSCOPIC CHOLECYSTECTOMY ;  Surgeon: Clayburn Pert, MD;  Location: [facility];  Service: General;  Laterality: N/A;  . UTERINE FIBROID SURGERY  2012    Family History  Problem Relation Age of Onset  . Hypertension Mother   . Hypertension Father      Social History Social History  Substance Use Topics  . Smoking status: Never Smoker  . Smokeless tobacco: Never Used  .  Alcohol use No     Allergies  Allergen Reactions  . Aspirin Nausea And Vomiting  . Oxycodone-Acetaminophen Rash and Shortness Of Breath  . Tape Other (See Comments)    Burning feeling. Has left mark on skin.  Melynda Keller [Lorcaserin Hcl] Other (See Comments)    headache  . Contrave [Naltrexone-Bupropion Hcl Er]   . Dicyclomine Nausea And Vomiting  . Norethindrone Rash    Current Outpatient Prescriptions  Medication Sig Dispense Refill  . amitriptyline (ELAVIL) 25 MG tablet Take 1 tablet (25 mg total) by mouth at bedtime. 30 tablet 0  . BENZEPRO FOAMING CLOTHS 6 % MISC Apply 1 each topically every other day.    . cholecalciferol (VITAMIN D) 1000 units tablet Take 1 tablet (1,000 Units total) by mouth daily. 30 tablet 0  . ferrous sulfate 325 (65 FE) MG tablet Take 1 tablet (325 mg total) by mouth every other day. 30 tablet 0  . Insulin Pen Needle (NOVOFINE) 32G X 6 MM MISC 1 each by Does not apply route every morning. 100 each 5  . linaclotide (LINZESS) 145 MCG CAPS capsule Take 1 capsule (145 mcg total) by mouth daily before breakfast. (Patient taking differently: Take 145 mcg by mouth as needed. ) 30 capsule 2  . Liraglutide -Weight Management (SAXENDA) 18 MG/3ML SOPN Inject 3 mg into the skin daily. 5 pen 2  . Multiple Vitamins-Minerals (MULTIVITAMIN ADULT) TABS Take 1 tablet by mouth daily. 30 tablet 0  .  SULFACLEANSE 8/4 8-4 % SUSP Apply 1 drop topically daily.    Marland Kitchen triamcinolone cream (KENALOG) 0.1 % Apply 1 application topically 2 (two) times daily. 30 g 0  . Vitamin D, Ergocalciferol, (DRISDOL) 50000 units CAPS capsule Take 1 capsule (50,000 Units total) by mouth every 7 (seven) days. 12 capsule 0   No current facility-administered medications for this visit.       REVIEW OF SYSTEMS (Negative unless checked)  Constitutional: [] Weight loss  [] Fever  [] Chills Cardiac: [] Chest pain   [] Chest pressure   [] Palpitations   [] Shortness of breath when laying flat   [] Shortness of  breath at rest   [] Shortness of breath with exertion. Vascular:  [] Pain in legs with walking   [] Pain in legs at rest   [] Pain in legs when laying flat   [] Claudication   [] Pain in feet when walking  [] Pain in feet at rest  [] Pain in feet when laying flat   [] History of DVT   [] Phlebitis   [x] Swelling in legs   [x] Varicose veins   [] Non-healing ulcers Pulmonary:   [] Uses home oxygen   [] Productive cough   [] Hemoptysis   [] Wheeze  [] COPD   [] Asthma Neurologic:  [] Dizziness  [] Blackouts   [] Seizures   [] History of stroke   [] History of TIA  [] Aphasia   [] Temporary blindness   [] Dysphagia   [] Weakness or numbness in arms   [] Weakness or numbness in legs Musculoskeletal:  [] Arthritis   [] Joint swelling   [] Joint pain   [] Low back pain Hematologic:  [] Easy bruising  [] Easy bleeding   [] Hypercoagulable state   [x] Anemic  [] Hepatitis Gastrointestinal:  [] Blood in stool   [] Vomiting blood  [] Gastroesophageal reflux/heartburn   [] Abdominal pain Genitourinary:  [] Chronic kidney disease   [] Difficult urination  [] Frequent urination  [] Burning with urination   [] Hematuria Skin:  [] Rashes   [] Ulcers   [] Wounds Psychological:  [] History of anxiety   []  History of major depression.    Physical Exam BP 132/90   Pulse 76   Resp 17   Ht 5\' 5"  (1.651 m)   Wt 221 lb (100.2 kg)   LMP 01/23/2017 (Exact Date)   BMI 36.78 kg/m  Gen:  WD/WN, NAD Head: [place]/AT, No temporalis wasting.  Ear/Nose/Throat: Hearing grossly intact, dentition good Eyes: Sclera non-icteric. Conjunctiva clear Neck: Supple, no nuchal rigidity. Trachea midline Pulmonary:  Good air movement, no use of accessory muscles, respirations not labored.  Cardiac: RRR, No JVD Vascular: Varicosities scattered bilaterally Vessel Right Left  Radial Palpable Palpable  Ulnar Palpable Palpable  Brachial Palpable Palpable  Carotid Palpable, without bruit Palpable, without bruit  Aorta Not palpable N/A  Femoral Palpable Palpable  Popliteal Palpable  Palpable  PT 1+ Palpable 1+ Palpable  DP Palpable Palpable   Gastrointestinal: soft, non-tender/non-distended. No guarding/reflex. No masses, surgical incisions, or scars. Musculoskeletal: M/S 5/5 throughout.   1 + RLE edema.  1-2 + LLE edema Neurologic: Sensation grossly intact in extremities.  Symmetrical.  Speech is fluent.  Psychiatric: Judgment intact, Mood & affect appropriate for pt's clinical situation. Dermatologic: No rashes or ulcers noted.  No cellulitis or open wounds.    Radiology No results found.  Labs Recent Results (from the past 2160 hour(s))  CBC with Differential/Platelet     Status: Abnormal   Collection Time: 12/08/16  8:20 AM  Result Value Ref Range   WBC 6.4 3.6 - 11.0 K/uL   RBC 4.08 3.80 - 5.20 MIL/uL   Hemoglobin 10.5 (L) 12.0 - 16.0 g/dL  HCT 31.6 (L) 35.0 - 47.0 %   MCV 77.6 (L) 80.0 - 100.0 fL   MCH 25.7 (L) 26.0 - 34.0 pg   MCHC 33.1 32.0 - 36.0 g/dL   RDW 19.0 (H) 11.5 - 14.5 %   Platelets 470 (H) 150 - 440 K/uL   Neutrophils Relative % 51 %   Neutro Abs 3.3 1.4 - 6.5 K/uL   Lymphocytes Relative 38 %   Lymphs Abs 2.4 1.0 - 3.6 K/uL   Monocytes Relative 9 %   Monocytes Absolute 0.6 0.2 - 0.9 K/uL   Eosinophils Relative 1 %   Eosinophils Absolute 0.1 0 - 0.7 K/uL   Basophils Relative 1 %   Basophils Absolute 0.1 0 - 0.1 K/uL  Ferritin     Status: None   Collection Time: 12/08/16  8:20 AM  Result Value Ref Range   Ferritin 21 11 - 307 ng/mL  Iron and TIBC     Status: None   Collection Time: 12/08/16  8:20 AM  Result Value Ref Range   Iron 57 28 - 170 ug/dL   TIBC 347 250 - 450 ug/dL   Saturation Ratios 16 10.4 - 31.8 %   UIBC 291 ug/dL  Sedimentation rate     Status: Abnormal   Collection Time: 12/08/16  8:20 AM  Result Value Ref Range   Sed Rate 26 (H) 0 - 20 mm/hr  COMPLETE METABOLIC PANEL WITH GFR     Status: None   Collection Time: 01/26/17 11:56 AM  Result Value Ref Range   Sodium 137 135 - 146 mmol/L   Potassium 4.3 3.5 -  5.3 mmol/L   Chloride 104 98 - 110 mmol/L   CO2 24 20 - 31 mmol/L   Glucose, Bld 93 65 - 99 mg/dL   BUN 10 7 - 25 mg/dL   Creat 0.68 0.50 - 1.10 mg/dL   Total Bilirubin 0.3 0.2 - 1.2 mg/dL   Alkaline Phosphatase 64 33 - 115 U/L   AST 11 10 - 30 U/L   ALT 10 6 - 29 U/L   Total Protein 7.0 6.1 - 8.1 g/dL   Albumin 3.7 3.6 - 5.1 g/dL   Calcium 8.9 8.6 - 10.2 mg/dL   GFR, Est African American >89 >=60 mL/min   GFR, Est Non African American >89 >=60 mL/min  B Nat Peptide     Status: None   Collection Time: 01/26/17 11:56 AM  Result Value Ref Range   Brain Natriuretic Peptide 15.1 <100 pg/mL    Comment:   BNP levels increase with age in the general population with the highest values seen in individuals greater than 11 years of age. Reference: Joellyn Rued Cardiol 2002; 57:322-02.       Assessment/Plan:  History of uterine fibroid Heavy menses are present. Seems to be cyclically related to her leg swelling although this is not entirely clear in its etiology.  Swelling of limb Etiology unclear. See workup as below.    The patient has symptoms consistent with chronic venous insufficiencyAnd I have some concern about DVT given the somewhat acute nature of the swelling. We discussed the natural history and treatment options for venous disease. I recommended the regular use of 20 - 30 mm Hg compression stockings, and the patient has some of these and will begin wearing them more regularly. I recommended leg elevation and anti-inflammatories as needed for pain. I have also recommended a complete venous duplex to assess the venous system for reflux or  thrombotic issues. This can be done at the patient's convenience. I will see the patient back to assess the response to conservative management, and determine further treatment options.     Leotis Pain 01/27/2017, 1:16 PM   This note was created with Dragon medical transcription system.  Any errors from dictation are unintentional.

## 2017-01-30 ENCOUNTER — Encounter (INDEPENDENT_AMBULATORY_CARE_PROVIDER_SITE_OTHER): Payer: Self-pay | Admitting: Vascular Surgery

## 2017-01-30 ENCOUNTER — Ambulatory Visit (INDEPENDENT_AMBULATORY_CARE_PROVIDER_SITE_OTHER): Payer: 59

## 2017-01-30 ENCOUNTER — Ambulatory Visit (INDEPENDENT_AMBULATORY_CARE_PROVIDER_SITE_OTHER): Payer: 59 | Admitting: Vascular Surgery

## 2017-01-30 VITALS — BP 132/88 | HR 80 | Resp 17

## 2017-01-30 DIAGNOSIS — M7989 Other specified soft tissue disorders: Secondary | ICD-10-CM

## 2017-01-30 NOTE — Progress Notes (Signed)
Subjective:    Patient ID: Sharon Herring, female    DOB: 1976/01/02, 41 y.o.   MRN: [id number] Chief Complaint  Patient presents with  . Follow-up   Patient presents to review vascular studies. She was last seen on 01/27/2017 with a chief complaint of left lower extremity swelling and pain. The patient underwent a left lower extremity venous duplex exam which was notable for no evidence of deep or superficial vein thrombosis. No reflux greater than 500 ms in the deep or superficial left venous system. The patient's symptoms remain. She has not started to wear compression stockings or elevate her lower extremity. The patient denies any fever nausea vomiting.    Review of Systems  Constitutional: Negative.   HENT: Negative.   Eyes: Negative.   Respiratory: Negative.   Cardiovascular: Positive for leg swelling.  Gastrointestinal: Negative.   Endocrine: Negative.   Genitourinary: Negative.   Musculoskeletal: Negative.   Skin: Negative.   Allergic/Immunologic: Negative.   Neurological: Negative.   Hematological: Negative.   Psychiatric/Behavioral: Negative.       Objective:   Physical Exam  Constitutional: She is oriented to person, place, and time. She appears well-developed and well-nourished. No distress.  HENT:  Head: Normocephalic and atraumatic.  Eyes: Conjunctivae are normal. Pupils are equal, round, and reactive to light.  Neck: Normal range of motion.  Cardiovascular: Normal rate, regular rhythm and normal heart sounds.   Pulses:      Radial pulses are 2+ on the right side, and 2+ on the left side.  Pulmonary/Chest: Effort normal.  Musculoskeletal: Normal range of motion. She exhibits edema (Mild left lower extremity edema noted).  Neurological: She is alert and oriented to person, place, and time.  Skin: Skin is warm and dry. She is not diaphoretic.  Psychiatric: She has a normal mood and affect. Her behavior is normal. Judgment and thought content normal.  Vitals  reviewed.   BP 132/88   Pulse 80   Resp 17   LMP 01/23/2017 (Exact Date)   Past Medical History:  Diagnosis Date  . Anemia   . Dizziness   . Fibroid uterus   . GERD (gastroesophageal reflux disease)   . IBS (irritable bowel syndrome)   . Migraine   . Polyp of stomach 01-06-16   Stomach polyp, pyloric    Social History   Social History  . Marital status: Single    Spouse name: N/A  . Number of children: N/A  . Years of education: N/A   Occupational History  . Not on file.   Social History Main Topics  . Smoking status: Never Smoker  . Smokeless tobacco: Never Used  . Alcohol use No  . Drug use: No  . Sexual activity: Yes    Birth control/ protection: None   Other Topics Concern  . Not on file   Social History Narrative  . No narrative on file    Past Surgical History:  Procedure Laterality Date  . APPENDECTOMY  2012  . ESOPHAGOGASTRODUODENOSCOPY (EGD) WITH PROPOFOL N/A 01/06/2016   Procedure: ESOPHAGOGASTRODUODENOSCOPY (EGD) WITH PROPOFOL;  Surgeon: Robert Bellow, MD;  Location: [facility];  Service: Endoscopy;  Laterality: N/A;  . ROBOTIC ASSISTED LAPAROSCOPIC CHOLECYSTECTOMY N/A 03/03/2016   Procedure: ROBOTIC ASSISTED LAPAROSCOPIC CHOLECYSTECTOMY ;  Surgeon: Clayburn Pert, MD;  Location: [facility];  Service: General;  Laterality: N/A;  . UTERINE FIBROID SURGERY  2012    Family History  Problem Relation Age of Onset  . Hypertension Mother   .  Hypertension Father     Allergies  Allergen Reactions  . Aspirin Nausea And Vomiting  . Oxycodone-Acetaminophen Rash and Shortness Of Breath  . Tape Other (See Comments)    Burning feeling. Has left mark on skin.  Melynda Keller [Lorcaserin Hcl] Other (See Comments)    headache  . Contrave [Naltrexone-Bupropion Hcl Er]   . Dicyclomine Nausea And Vomiting  . Norethindrone Rash       Assessment & Plan:  Patient presents to review vascular studies. She was last seen on 01/27/2017 with a chief complaint  of left lower extremity swelling and pain. The patient underwent a left lower extremity venous duplex exam which was notable for no evidence of deep or superficial vein thrombosis. No reflux greater than 500 ms in the deep or superficial left venous system. The patient denies any fever nausea vomiting.   1. Swelling of limb - stable Patient with stable symptoms. The patient has not started to engage in wearing medical grade 1 compression stockings or elevation of her lower extremity. No DVT, SVT or reflux found on duplex today. Patient encouraged to follow-up with PCP for further workup. Patient is encouraged to follow-up with [name] after engaging conservative therapy for 1 month to assess her progress. If her symptoms do not improve would consider adding lymphedema pump to her therapy.  Current Outpatient Prescriptions on File Prior to Visit  Medication Sig Dispense Refill  . amitriptyline (ELAVIL) 25 MG tablet Take 1 tablet (25 mg total) by mouth at bedtime. 30 tablet 0  . BENZEPRO FOAMING CLOTHS 6 % MISC Apply 1 each topically every other day.    . cholecalciferol (VITAMIN D) 1000 units tablet Take 1 tablet (1,000 Units total) by mouth daily. 30 tablet 0  . ferrous sulfate 325 (65 FE) MG tablet Take 1 tablet (325 mg total) by mouth every other day. 30 tablet 0  . Insulin Pen Needle (NOVOFINE) 32G X 6 MM MISC 1 each by Does not apply route every morning. 100 each 5  . linaclotide (LINZESS) 145 MCG CAPS capsule Take 1 capsule (145 mcg total) by mouth daily before breakfast. (Patient taking differently: Take 145 mcg by mouth as needed. ) 30 capsule 2  . Liraglutide -Weight Management (SAXENDA) 18 MG/3ML SOPN Inject 3 mg into the skin daily. 5 pen 2  . Multiple Vitamins-Minerals (MULTIVITAMIN ADULT) TABS Take 1 tablet by mouth daily. 30 tablet 0  . SULFACLEANSE 8/4 8-4 % SUSP Apply 1 drop topically daily.    Marland Kitchen triamcinolone cream (KENALOG) 0.1 % Apply 1 application topically 2 (two) times daily. 30  g 0  . Vitamin D, Ergocalciferol, (DRISDOL) 50000 units CAPS capsule Take 1 capsule (50,000 Units total) by mouth every 7 (seven) days. 12 capsule 0   No current facility-administered medications on file prior to visit.     There are no Patient Instructions on file for this visit. No Follow-up on file.   Iara Monds A Joangel Vanosdol, PA-C

## 2017-02-01 ENCOUNTER — Other Ambulatory Visit: Payer: 59

## 2017-02-03 ENCOUNTER — Other Ambulatory Visit: Payer: Self-pay

## 2017-02-03 MED ORDER — FUROSEMIDE 20 MG PO TABS: 20.0000 mg | ORAL_TABLET | Freq: Every day | ORAL | 1 refills | Status: DC

## 2017-02-03 NOTE — Progress Notes (Signed)
lasaix

## 2017-02-06 ENCOUNTER — Inpatient Hospital Stay: Payer: 59

## 2017-02-07 ENCOUNTER — Ambulatory Visit: Payer: Self-pay | Admitting: Hematology and Oncology

## 2017-02-07 ENCOUNTER — Inpatient Hospital Stay: Payer: 59

## 2017-02-07 ENCOUNTER — Ambulatory Visit: Payer: Self-pay

## 2017-02-07 ENCOUNTER — Inpatient Hospital Stay: Payer: 59 | Admitting: Hematology and Oncology

## 2017-02-08 ENCOUNTER — Encounter: Payer: 59 | Admitting: Obstetrics and Gynecology

## 2017-02-09 ENCOUNTER — Ambulatory Visit: Payer: 59

## 2017-02-09 ENCOUNTER — Inpatient Hospital Stay: Payer: 59 | Attending: Hematology and Oncology

## 2017-02-09 DIAGNOSIS — Z79899 Other long term (current) drug therapy: Secondary | ICD-10-CM | POA: Insufficient documentation

## 2017-02-09 DIAGNOSIS — E538 Deficiency of other specified B group vitamins: Secondary | ICD-10-CM | POA: Insufficient documentation

## 2017-02-09 DIAGNOSIS — N920 Excessive and frequent menstruation with regular cycle: Secondary | ICD-10-CM | POA: Insufficient documentation

## 2017-02-09 DIAGNOSIS — K219 Gastro-esophageal reflux disease without esophagitis: Secondary | ICD-10-CM | POA: Diagnosis not present

## 2017-02-09 DIAGNOSIS — D500 Iron deficiency anemia secondary to blood loss (chronic): Secondary | ICD-10-CM | POA: Insufficient documentation

## 2017-02-09 DIAGNOSIS — R5383 Other fatigue: Secondary | ICD-10-CM | POA: Diagnosis not present

## 2017-02-09 DIAGNOSIS — K589 Irritable bowel syndrome without diarrhea: Secondary | ICD-10-CM | POA: Diagnosis not present

## 2017-02-09 LAB — CBC WITH DIFFERENTIAL/PLATELET
Basophils Absolute: 0.1 10*3/uL (ref 0–0.1)
Basophils Relative: 1 %
Eosinophils Absolute: 0.1 10*3/uL (ref 0–0.7)
Eosinophils Relative: 1 %
HCT: 31.4 % — ABNORMAL LOW (ref 35.0–47.0)
Hemoglobin: 10.4 g/dL — ABNORMAL LOW (ref 12.0–16.0)
Lymphocytes Relative: 37 %
Lymphs Abs: 2.6 10*3/uL (ref 1.0–3.6)
MCH: 25.3 pg — ABNORMAL LOW (ref 26.0–34.0)
MCHC: 33.2 g/dL (ref 32.0–36.0)
MCV: 76.2 fL — ABNORMAL LOW (ref 80.0–100.0)
Monocytes Absolute: 0.5 10*3/uL (ref 0.2–0.9)
Monocytes Relative: 7 %
Neutro Abs: 3.8 10*3/uL (ref 1.4–6.5)
Neutrophils Relative %: 54 %
Platelets: 500 10*3/uL — ABNORMAL HIGH (ref 150–440)
RBC: 4.12 MIL/uL (ref 3.80–5.20)
RDW: 14.9 % — ABNORMAL HIGH (ref 11.5–14.5)
WBC: 7.1 10*3/uL (ref 3.6–11.0)

## 2017-02-09 LAB — FERRITIN: Ferritin: 12 ng/mL (ref 11–307)

## 2017-02-09 LAB — SEDIMENTATION RATE: Sed Rate: 44 mm/hr — ABNORMAL HIGH (ref 0–20)

## 2017-02-10 ENCOUNTER — Encounter: Payer: Self-pay | Admitting: Hematology and Oncology

## 2017-02-10 ENCOUNTER — Inpatient Hospital Stay: Payer: 59

## 2017-02-10 ENCOUNTER — Other Ambulatory Visit: Payer: Self-pay | Admitting: Hematology and Oncology

## 2017-02-10 ENCOUNTER — Inpatient Hospital Stay (HOSPITAL_BASED_OUTPATIENT_CLINIC_OR_DEPARTMENT_OTHER): Payer: 59 | Admitting: Hematology and Oncology

## 2017-02-10 VITALS — BP 122/85 | HR 79 | Temp 95.4°F | Wt 219.2 lb

## 2017-02-10 DIAGNOSIS — Z79899 Other long term (current) drug therapy: Secondary | ICD-10-CM | POA: Diagnosis not present

## 2017-02-10 DIAGNOSIS — N920 Excessive and frequent menstruation with regular cycle: Secondary | ICD-10-CM

## 2017-02-10 DIAGNOSIS — R5383 Other fatigue: Secondary | ICD-10-CM

## 2017-02-10 DIAGNOSIS — E538 Deficiency of other specified B group vitamins: Secondary | ICD-10-CM

## 2017-02-10 DIAGNOSIS — D500 Iron deficiency anemia secondary to blood loss (chronic): Secondary | ICD-10-CM | POA: Diagnosis not present

## 2017-02-10 DIAGNOSIS — K219 Gastro-esophageal reflux disease without esophagitis: Secondary | ICD-10-CM | POA: Diagnosis not present

## 2017-02-10 DIAGNOSIS — K589 Irritable bowel syndrome without diarrhea: Secondary | ICD-10-CM | POA: Diagnosis not present

## 2017-02-10 NOTE — Progress Notes (Signed)
[facility] day:  02/10/2017  Chief Complaint: Sharon Herring is a 41 y.o. female with iron deficiency anemia who is seen for 4 month assessment.  HPI: The patient was last seen in the hematology clinic on 10/10/2016.  At that time, she felt a little sluggish.  Ice pica had resolved.  Hematocrit had improved from 28.3 to 32.4.  RBCs remained microcytic.  Ferritin had improved from 5 to 97.  Labs on 12/08/2016 revealed a hematocrit of 31.6, hemoglobin 10.5, MCV 77.6.  Ferritin was 21.  Iron saturation was 16% with a TIBC of 347.  CBC on 02/09/2017 revealed a hematocrit of 31.4, hemoglobin 10.4, MCV 76.2.  Ferritin was 12.  Symptomatically, she is fatigued. She is experiencing hypersomnia. Patient has been experiencing lower extremity edema for over a week. PCP gave patient Lasix and compression stockings; interventions have been effective. She is still having heavy menses.  She sees gynecology next week.   Past Medical History:  Diagnosis Date  . Anemia   . Dizziness   . Fibroid uterus   . GERD (gastroesophageal reflux disease)   . IBS (irritable bowel syndrome)   . Migraine   . Polyp of stomach 01-06-16   Stomach polyp, pyloric    Past Surgical History:  Procedure Laterality Date  . APPENDECTOMY  2012  . ESOPHAGOGASTRODUODENOSCOPY (EGD) WITH PROPOFOL N/A 01/06/2016   Procedure: ESOPHAGOGASTRODUODENOSCOPY (EGD) WITH PROPOFOL;  Surgeon: Robert Bellow, MD;  Location: [facility];  Service: Endoscopy;  Laterality: N/A;  . ROBOTIC ASSISTED LAPAROSCOPIC CHOLECYSTECTOMY N/A 03/03/2016   Procedure: ROBOTIC ASSISTED LAPAROSCOPIC CHOLECYSTECTOMY ;  Surgeon: Clayburn Pert, MD;  Location: [facility] ORS;  Service: General;  Laterality: N/A;  . UTERINE FIBROID SURGERY  2012    Family History  Problem Relation Age of Onset  . Hypertension Mother   . Hypertension Father     Social History:  reports that she has never smoked. She has never used  smokeless tobacco. She reports that she does not drink alcohol or use drugs.  She denies any exposure to radiation or toxins.  She works at [employer] as a Psychologist, sport and exercise.  She lives in [place].  The patient is alone today.  Allergies:  Allergies  Allergen Reactions  . Aspirin Nausea And Vomiting  . Oxycodone-Acetaminophen Rash and Shortness Of Breath  . Tape Other (See Comments)    Burning feeling. Has left mark on skin.  Melynda Keller [Lorcaserin Hcl] Other (See Comments)    headache  . Contrave [Naltrexone-Bupropion Hcl Er]   . Dicyclomine Nausea And Vomiting  . Norethindrone Rash    Current Medications: Current Outpatient Prescriptions  Medication Sig Dispense Refill  . amitriptyline (ELAVIL) 25 MG tablet Take 1 tablet (25 mg total) by mouth at bedtime. 30 tablet 0  . BENZEPRO FOAMING CLOTHS 6 % MISC Apply 1 each topically every other day.    . cholecalciferol (VITAMIN D) 1000 units tablet Take 1 tablet (1,000 Units total) by mouth daily. 30 tablet 0  . ferrous sulfate 325 (65 FE) MG tablet Take 1 tablet (325 mg total) by mouth every other day. 30 tablet 0  . furosemide (LASIX) 20 MG tablet Take 1 tablet (20 mg total) by mouth daily. 30 tablet 1  . Insulin Pen Needle (NOVOFINE) 32G X 6 MM MISC 1 each by Does not apply route every morning. 100 each 5  . linaclotide (LINZESS) 145 MCG CAPS capsule Take 1 capsule (145 mcg total) by mouth daily  before breakfast. (Patient taking differently: Take 145 mcg by mouth as needed. ) 30 capsule 2  . Liraglutide -Weight Management (SAXENDA) 18 MG/3ML SOPN Inject 3 mg into the skin daily. 5 pen 2  . Multiple Vitamins-Minerals (MULTIVITAMIN ADULT) TABS Take 1 tablet by mouth daily. 30 tablet 0  . SULFACLEANSE 8/4 8-4 % SUSP Apply 1 drop topically daily.    Marland Kitchen triamcinolone cream (KENALOG) 0.1 % Apply 1 application topically 2 (two) times daily. 30 g 0  . Vitamin D, Ergocalciferol, (DRISDOL) 50000 units CAPS capsule Take 1 capsule (50,000 Units  total) by mouth every 7 (seven) days. 12 capsule 0   No current facility-administered medications for this visit.     Review of Systems:  GENERAL:  Feels "really tired".  No fevers or sweats.  Weight up 7 pounds. PERFORMANCE STATUS (ECOG):  0 HEENT:  No visual changes, runny nose, sore throat, mouth sores or tenderness. Lungs: No shortness of breath or cough.  No hemoptysis. Cardiac:  No chest pain, palpitations, orthopnea, or PND. GI:  No nausea, vomiting, diarrhea, constipation, melena or hematochezia. GU:  No urgency, frequency, dysuria, or hematuria.  8 days of heavy flow. Musculoskeletal:  No back pain.  No joint pain.  No muscle tenderness. Extremities:  Interval lower extremity swelling (received Lasix). Skin:  No rashes or skin changes. Neuro:  No headache, numbness or weakness, balance or coordination issues. Endocrine:  No diabetes, thyroid issues, hot flashes or night sweats. Psych:  No mood changes, depression or anxiety. Pain:  No focal pain. Review of systems:  All other systems reviewed and found to be negative.  Physical Exam: Blood pressure 122/85, pulse 79, temperature (!) 95.4 F (35.2 C), temperature source Tympanic, weight 219 lb 4 oz (99.5 kg), last menstrual period 01/23/2017. GENERAL:  Well developed, well nourished, woman sitting comfortably in the exam room in no acute distress. MENTAL STATUS:  Alert and oriented to person, place and time. HEAD:  Short brown hair.  Normocephalic, atraumatic, face symmetric, no Cushingoid features. EYES:  Glasses.  Brown eyes.  Pupils equal round and reactive to light and accomodation.  No conjunctivitis or scleral icterus. ENT:  Oropharynx clear without lesion.  Tongue normal. Mucous membranes moist.  RESPIRATORY:  Clear to auscultation without rales, wheezes or rhonchi. CARDIOVASCULAR:  Regular rate and rhythm without murmur, rub or gallop. ABDOMEN:  Soft, non-tender, with active bowel sounds, and no hepatosplenomegaly.  No  masses. SKIN:  No rashes, ulcers or lesions. EXTREMITIES: No edema, no skin discoloration or tenderness.  No palpable cords. LYMPH NODES: No palpable cervical, supraclavicular, axillary or inguinal adenopathy  NEUROLOGICAL: Unremarkable. PSYCH:  Appropriate.   Appointment on 02/09/2017  Component Date Value Ref Range Status  . WBC 02/09/2017 7.1  3.6 - 11.0 K/uL Final  . RBC 02/09/2017 4.12  3.80 - 5.20 MIL/uL Final  . Hemoglobin 02/09/2017 10.4* 12.0 - 16.0 g/dL Final  . HCT 02/09/2017 31.4* 35.0 - 47.0 % Final  . MCV 02/09/2017 76.2* 80.0 - 100.0 fL Final  . MCH 02/09/2017 25.3* 26.0 - 34.0 pg Final  . MCHC 02/09/2017 33.2  32.0 - 36.0 g/dL Final  . RDW 02/09/2017 14.9* 11.5 - 14.5 % Final  . Platelets 02/09/2017 500* 150 - 440 K/uL Final  . Neutrophils Relative % 02/09/2017 54  % Final  . Neutro Abs 02/09/2017 3.8  1.4 - 6.5 K/uL Final  . Lymphocytes Relative 02/09/2017 37  % Final  . Lymphs Abs 02/09/2017 2.6  1.0 - 3.6 K/uL  Final  . Monocytes Relative 02/09/2017 7  % Final  . Monocytes Absolute 02/09/2017 0.5  0.2 - 0.9 K/uL Final  . Eosinophils Relative 02/09/2017 1  % Final  . Eosinophils Absolute 02/09/2017 0.1  0 - 0.7 K/uL Final  . Basophils Relative 02/09/2017 1  % Final  . Basophils Absolute 02/09/2017 0.1  0 - 0.1 K/uL Final  . Ferritin 02/09/2017 12  11 - 307 ng/mL Final  . Sed Rate 02/09/2017 44* 0 - 20 mm/hr Final    Assessment:  Sharon Herring is a 41 y.o. female with iron deficiency anemia secondary to heavy menses.  She has a history of on and off anemia for several years (before 02/2014).  Diet is fairly good.  She denies any melena, hematochezia, or hematuria.  She has had ice pica x 2 months.  She has a history of unresponsiveness to oral iron (4 month trial in 2015).  Labs on 07/21/2016 revealed a hematocrit of 30.9, hemoglobin 9.3, MCV 73.4, platelets 675,000, white count 5300 with an Ross of 2650. Creatinine was 0.7.  Liver function tests were normal.  Iron  studies included a ferritin of 6, iron saturation 6% and TIBC of 440.  HIV testing was negative.  Labs on 08/25/2016 revealed a hematocrit of 28.3, hemoglobin 9.2, and MCV 69.6.  Ferritin was 5.  She received Feraheme 510 mg IV on 08/29/2016 and 09/05/2016.  Ferritin goal is 100.  Ferritin has been followed:  18 on 12/02/2015, 6 on 07/21/2016, 5 on 08/25/2016, 214 on 09/21/2016, 97 on 10/10/2016, 21 on 12/08/2016, and 12 on 02/09/2017.  She has B12 deficiency.  She received B12 shots x 2-3 months.  B12 was 462 on 07/21/2016.  Symptomatically, she feels fatigued.  Menses are still heavy.  Ice pica has resolved.  Exam is unremarkable.  Hematocrit has drifted down to 31.4.  RBCs remain microcytic.   Plan:  1.  Review labs from 02/09/2017.  RBC are microcytic.  Iron stores are low.  Discuss plan for additional IV iron. 2.  Feraheme weekly x 2 doses (first dose today). 3.  Patient to turn in guaiac cards x 3; patient plans to complete these this week. 4.  Patient to follow-up with gynecology re: heavy menses. 5.  RTC in 3 months for MD assessment, labs (CBC with diff, ferritin,  ESR-day before) +/- Feraheme. 6.  Patient to call with continued heavy menses. If she calls with continued heavy menses, von Willebrand panel will be drawn (order pending).    Lequita Asal, MD  02/10/2017, 9:17 AM

## 2017-02-10 NOTE — Progress Notes (Signed)
Patient states she feels like her iron is low.  She feels fatigued and weak.

## 2017-02-13 ENCOUNTER — Inpatient Hospital Stay: Payer: 59

## 2017-02-13 ENCOUNTER — Ambulatory Visit (INDEPENDENT_AMBULATORY_CARE_PROVIDER_SITE_OTHER): Payer: 59

## 2017-02-13 VITALS — BP 120/83 | HR 80 | Temp 97.0°F | Resp 18

## 2017-02-13 DIAGNOSIS — D251 Intramural leiomyoma of uterus: Secondary | ICD-10-CM | POA: Diagnosis not present

## 2017-02-13 DIAGNOSIS — R5383 Other fatigue: Secondary | ICD-10-CM | POA: Diagnosis not present

## 2017-02-13 DIAGNOSIS — D509 Iron deficiency anemia, unspecified: Secondary | ICD-10-CM

## 2017-02-13 DIAGNOSIS — N920 Excessive and frequent menstruation with regular cycle: Secondary | ICD-10-CM | POA: Diagnosis not present

## 2017-02-13 DIAGNOSIS — Z79899 Other long term (current) drug therapy: Secondary | ICD-10-CM | POA: Diagnosis not present

## 2017-02-13 DIAGNOSIS — K219 Gastro-esophageal reflux disease without esophagitis: Secondary | ICD-10-CM | POA: Diagnosis not present

## 2017-02-13 DIAGNOSIS — D500 Iron deficiency anemia secondary to blood loss (chronic): Secondary | ICD-10-CM | POA: Diagnosis not present

## 2017-02-13 DIAGNOSIS — K589 Irritable bowel syndrome without diarrhea: Secondary | ICD-10-CM | POA: Diagnosis not present

## 2017-02-13 DIAGNOSIS — E538 Deficiency of other specified B group vitamins: Secondary | ICD-10-CM | POA: Diagnosis not present

## 2017-02-13 MED ORDER — SODIUM CHLORIDE 0.9 % IV SOLN
510.0000 mg | Freq: Once | INTRAVENOUS | Status: AC
  Administered 2017-02-13: 510 mg via INTRAVENOUS
  Filled 2017-02-13: qty 17

## 2017-02-13 MED ORDER — SODIUM CHLORIDE 0.9 % IV SOLN
Freq: Once | INTRAVENOUS | Status: AC
  Administered 2017-02-13: 14:00:00 via INTRAVENOUS
  Filled 2017-02-13: qty 1000

## 2017-02-14 ENCOUNTER — Ambulatory Visit (INDEPENDENT_AMBULATORY_CARE_PROVIDER_SITE_OTHER): Payer: 59 | Admitting: Obstetrics and Gynecology

## 2017-02-14 ENCOUNTER — Encounter: Payer: Self-pay | Admitting: Obstetrics and Gynecology

## 2017-02-14 VITALS — BP 119/72 | HR 76 | Ht 65.0 in | Wt 219.6 lb

## 2017-02-14 DIAGNOSIS — D250 Submucous leiomyoma of uterus: Secondary | ICD-10-CM

## 2017-02-14 DIAGNOSIS — N920 Excessive and frequent menstruation with regular cycle: Secondary | ICD-10-CM

## 2017-02-14 MED ORDER — TRANEXAMIC ACID 650 MG PO TABS: 1300.0000 mg | ORAL_TABLET | Freq: Three times a day (TID) | ORAL | 2 refills | Status: DC

## 2017-02-14 NOTE — Patient Instructions (Signed)
Tranexamic acid oral tablets What is this medicine? TRANEXAMIC ACID (TRAN ex AM ik AS id) slows down or stops blood clots from being broken down. This medicine is used to treat heavy monthly menstrual bleeding. This medicine may be used for other purposes; ask your health care provider or pharmacist if you have questions. COMMON BRAND NAME(S): Cyklokapron, Lysteda What should I tell my health care provider before I take this medicine? They need to know if you have any of these conditions: -bleeding in the brain -blood clotting problems -kidney disease -vision problems -an unusual allergic reaction to tranexamic acid, other medicines, foods, dyes, or preservatives -pregnant or trying to get pregnant -breast-feeding How should I use this medicine? Take this medicine by mouth with a glass of water. Follow the directions on the prescription label. Do not cut, crush, or chew this medicine. You can take it with or without food. If it upsets your stomach, take it with food. Take your medicine at regular intervals. Do not take it more often than directed. Do not stop taking except on your doctor's advice. Do not take this medicine until your period has started. Do not take it for more than 5 days in a row. Do not take this medicine when you do not have your period. Talk to your pediatrician regarding the use of this medicine in children. While this drug may be prescribed for female children as young as 1 years of age for selected conditions, precautions do apply. Overdosage: If you think you have taken too much of this medicine contact a poison control center or emergency room at once. NOTE: This medicine is only for you. Do not share this medicine with others. What if I miss a dose? If you miss a dose, take it when you remember, and then take your next dose at least 6 hours later. Do not take more than 2 tablets at a time to make up for missed doses. What may interact with this medicine? Do not take  this medicine with any of the following medications: -estrogens -birth control pills, patches, injections, rings or other devices that contain both an estrogen and a progestin This medicine may also interact with the following medications: -certain medicines used to help your blood clot -tretinoin (taken by mouth) This list may not describe all possible interactions. Give your health care provider a list of all the medicines, herbs, non-prescription drugs, or dietary supplements you use. Also tell them if you smoke, drink alcohol, or use illegal drugs. Some items may interact with your medicine. What should I watch for while using this medicine? Tell your doctor or healthcare professional if your symptoms do not start to get better or if they get worse. Tell your doctor or healthcare professional if you notice any eye problems while taking this medicine. Your doctor will refer you to an eye doctor who will examine your eyes. What side effects may I notice from receiving this medicine? Side effects that you should report to your doctor or health care professional as soon as possible: -allergic reactions like skin rash, itching or hives, swelling of the face, lips, or tongue -breathing difficulties -changes in vision -sudden or severe pain in the chest, legs, head, or groin -unusually weak or tired Side effects that usually do not require medical attention (report to your doctor or health care professional if they continue or are bothersome): -back pain -headache -muscle or joint aches -sinus and nasal problems -stomach pain -tiredness This list may not describe all  possible side effects. Call your doctor for medical advice about side effects. You may report side effects to FDA at 1-800-FDA-1088. Where should I keep my medicine? Keep out of the reach of children. Store at room temperature between 15 and 30 degrees C (59 and 86 degrees F). Throw away any unused medicine after the expiration  date. NOTE: This sheet is a summary. It may not cover all possible information. If you have questions about this medicine, talk to your doctor, pharmacist, or health care provider.  2018 Elsevier/Gold Standard (2015-09-10 09:12:15)  Uterine Fibroids Uterine fibroids are tissue masses (tumors) that can develop in the womb (uterus). They are also called leiomyomas. This type of tumor is not cancerous (benign) and does not spread to other parts of the body outside of the pelvic area, which is between the hip bones. Occasionally, fibroids may develop in the fallopian tubes, in the cervix, or on the support structures (ligaments) that surround the uterus. You can have one or many fibroids. Fibroids can vary in size, weight, and where they grow in the uterus. Some can become quite large. Most fibroids do not require medical treatment. What are the causes? A fibroid can develop when a single uterine cell keeps growing (replicating). Most cells in the human body have a control mechanism that keeps them from replicating without control. What are the signs or symptoms? Symptoms may include:  Heavy bleeding during your period.  Bleeding or spotting between periods.  Pelvic pain and pressure.  Bladder problems, such as needing to urinate more often (urinary frequency) or urgently.  Inability to reproduce offspring (infertility).  Miscarriages.  How is this diagnosed? Uterine fibroids are diagnosed through a physical exam. Your health care provider may feel the lumpy tumors during a pelvic exam. Ultrasonography and an MRI may be done to determine the size, location, and number of fibroids. How is this treated? Treatment may include:  Watchful waiting. This involves getting the fibroid checked by your health care provider to see if it grows or shrinks. Follow your health care provider's recommendations for how often to have this checked.  Hormone medicines. These can be taken by mouth or given  through an intrauterine device (IUD).  Surgery. ? Removing the fibroids (myomectomy) or the uterus (hysterectomy). ? Removing blood supply to the fibroids (uterine artery embolization).  If fibroids interfere with your fertility and you want to become pregnant, your health care provider may recommend having the fibroids removed. Follow these instructions at home:  Keep all follow-up visits as directed by your health care provider. This is important.  Take over-the-counter and prescription medicines only as told by your health care provider. ? If you were prescribed a hormone treatment, take the hormone medicines exactly as directed.  Ask your health care provider about taking iron pills and increasing the amount of dark green, leafy vegetables in your diet. These actions can help to boost your blood iron levels, which may be affected by heavy menstrual bleeding.  Pay close attention to your period and tell your health care provider about any changes, such as: ? Increased blood flow that requires you to use more pads or tampons than usual per month. ? A change in the number of days that your period lasts per month. ? A change in symptoms that are associated with your period, such as abdominal cramping or back pain. Contact a health care provider if:  You have pelvic pain, back pain, or abdominal cramps that cannot be controlled with medicines.  You have an increase in bleeding between and during periods.  You soak tampons or pads in a half hour or less.  You feel lightheaded, extra tired, or weak. Get help right away if:  You faint.  You have a sudden increase in pelvic pain. This information is not intended to replace advice given to you by your health care provider. Make sure you discuss any questions you have with your health care provider. Document Released: 08/05/2000 Document Revised: 04/07/2016 Document Reviewed: 02/04/2014 Elsevier Interactive Patient Education  [employer].

## 2017-02-14 NOTE — Progress Notes (Signed)
    GYNECOLOGY PROGRESS NOTE  Subjective:    Patient ID: Sharon Herring, female    DOB: [birth year], 41 y.o.   MRN: [id number]  HPI  Patient is a 41 y.o. [id number] female who presents for follow up after ultrasound and discussion of further management of heavy menstrual periods and fibroid uterus. Has a h/o prior myomectomy for fibroids several years ago.  Was seen by Dr. Amalia Hailey last visit.  Denies complaints today.   The following portions of the patient's history were reviewed and updated as appropriate: allergies, current medications, past family history, past medical history, past social history, past surgical history and problem list.  Review of Systems Pertinent items noted in HPI and remainder of comprehensive ROS otherwise negative.   Objective:   Blood pressure 119/72, pulse 76, height 5\' 5"  (1.651 m), weight 219 lb 9.6 oz (99.6 kg), last menstrual period 01/23/2017. General appearance: alert and no distress Remainder of exam deferred.    Imaging:  ULTRASOUND REPORT  Location: [facility] Date of Service: 02/13/17   Indications: fibroids Findings:  The uterus measures 11.3 x 7.5 x 7.6 cm. Echo texture is heterogenous with evidence of focal masses. Within the uterus are multiple suspected fibroids measuring: Fibroid 1:Anterior fundal submucosal 2.8 x 3.8 x 2.8 cm Fibroid 2:Posterior Fundal submucosal 3.6 x 3.9 x 4.3 cm Fibroid 3:fundal intramural 4.4 x 3.4 x 3.7 cm The Endometrium measures 6.3 mm.  Right Ovary measures 3 x 1.6 x 1.6 cm. It is normal in appearance. Left Ovary measures 3.2 x 2.1 x 2.4 cm. It is normal appearance. Survey of the adnexa demonstrates no adnexal masses. There is no free fluid in the cul de sac.  Impression: 1. Fibroid uterus  Recommendations: 1.Clinical correlation with the patient's History and Physical Exam.   Sharon Herring   Assessment:   Menorrhagia Fibroid uterus  Plan:   - Reviewed ultrasound findings with  patient.  All questions answered.  Discussed management options for heavy menstrual cycles, including tranexamic acid (Lysteda), OCPs, Depo Provera, Mirena IUD, or hysteroscopic myomectomy.  Patient declines endometrial ablation or definitive management with hysterectomy as she desires to maintain her fertility as long as possible. Is considering conception within the next 6 months - 1 year.  Counseled that fertility decreases with age, especially over age 72. Discussed risks and benefits of each method.  Discussed that not all methods would help with the management (possible shrinkage) of her fibroids, and that a myomectomy may need to be considered at some point if infertility or recurrent miscarriages occur.  Patient notes understanding.  She desires Lysteda.  Printed patient education handouts were given to the patient to review at home.  Bleeding precautions reviewed.  - To f/u in 2-3 months to reassess symptoms.    A total of 25 minutes were spent face-to-face with the patient during this encounter and over half of that time involved counseling and coordination of care.   Sharon Maid, MD [facility]

## 2017-02-20 ENCOUNTER — Ambulatory Visit: Payer: Self-pay

## 2017-02-28 ENCOUNTER — Inpatient Hospital Stay: Payer: 59 | Attending: Hematology and Oncology

## 2017-02-28 DIAGNOSIS — K219 Gastro-esophageal reflux disease without esophagitis: Secondary | ICD-10-CM | POA: Diagnosis not present

## 2017-02-28 DIAGNOSIS — D509 Iron deficiency anemia, unspecified: Secondary | ICD-10-CM

## 2017-02-28 DIAGNOSIS — R5383 Other fatigue: Secondary | ICD-10-CM | POA: Diagnosis not present

## 2017-02-28 DIAGNOSIS — N920 Excessive and frequent menstruation with regular cycle: Secondary | ICD-10-CM | POA: Insufficient documentation

## 2017-02-28 DIAGNOSIS — D500 Iron deficiency anemia secondary to blood loss (chronic): Secondary | ICD-10-CM | POA: Diagnosis not present

## 2017-02-28 DIAGNOSIS — Z79899 Other long term (current) drug therapy: Secondary | ICD-10-CM | POA: Diagnosis not present

## 2017-02-28 DIAGNOSIS — K589 Irritable bowel syndrome without diarrhea: Secondary | ICD-10-CM | POA: Insufficient documentation

## 2017-02-28 DIAGNOSIS — E538 Deficiency of other specified B group vitamins: Secondary | ICD-10-CM | POA: Insufficient documentation

## 2017-02-28 MED ORDER — SODIUM CHLORIDE 0.9 % IV SOLN
510.0000 mg | Freq: Once | INTRAVENOUS | Status: AC
  Administered 2017-02-28: 510 mg via INTRAVENOUS
  Filled 2017-02-28: qty 17

## 2017-02-28 MED ORDER — SODIUM CHLORIDE 0.9 % IV SOLN
Freq: Once | INTRAVENOUS | Status: AC
  Administered 2017-02-28: 14:00:00 via INTRAVENOUS
  Filled 2017-02-28: qty 1000

## 2017-04-07 ENCOUNTER — Ambulatory Visit (INDEPENDENT_AMBULATORY_CARE_PROVIDER_SITE_OTHER): Payer: 59 | Admitting: Family Medicine

## 2017-04-07 ENCOUNTER — Encounter: Payer: Self-pay | Admitting: Family Medicine

## 2017-04-07 DIAGNOSIS — M542 Cervicalgia: Secondary | ICD-10-CM

## 2017-04-07 MED ORDER — NAPROXEN 500 MG PO TABS: 500.0000 mg | ORAL_TABLET | Freq: Two times a day (BID) | ORAL | 0 refills | Status: DC

## 2017-04-07 MED ORDER — CYCLOBENZAPRINE HCL 10 MG PO TABS: 5.0000 mg | ORAL_TABLET | Freq: Every day | ORAL | 0 refills | Status: DC
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
## 2017-04-11 ENCOUNTER — Ambulatory Visit (INDEPENDENT_AMBULATORY_CARE_PROVIDER_SITE_OTHER): Payer: 59 | Admitting: Family Medicine

## 2017-04-11 ENCOUNTER — Ambulatory Visit: Admission: RE | Admit: 2017-04-11 | Payer: 59 | Source: Ambulatory Visit

## 2017-04-11 ENCOUNTER — Encounter: Payer: Self-pay | Admitting: Family Medicine

## 2017-04-11 VITALS — BP 110/68 | Temp 98.8°F | Resp 16 | Ht 65.0 in | Wt 190.5 lb

## 2017-04-11 DIAGNOSIS — G4489 Other headache syndrome: Secondary | ICD-10-CM

## 2017-04-11 DIAGNOSIS — K529 Noninfective gastroenteritis and colitis, unspecified: Secondary | ICD-10-CM | POA: Diagnosis not present

## 2017-04-11 DIAGNOSIS — R112 Nausea with vomiting, unspecified: Secondary | ICD-10-CM | POA: Diagnosis not present

## 2017-04-11 LAB — CBC WITH DIFFERENTIAL/PLATELET
BASOS ABS: 0 {cells}/uL (ref 0–200)
BASOS PCT: 0 %
EOS PCT: 1 %
Eosinophils Absolute: 63 cells/uL (ref 15–500)
HCT: 35 % (ref 35.0–45.0)
HEMOGLOBIN: 11 g/dL — AB (ref 11.7–15.5)
LYMPHS ABS: 2520 {cells}/uL (ref 850–3900)
Lymphocytes Relative: 40 %
MCH: 25.9 pg — ABNORMAL LOW (ref 27.0–33.0)
MCHC: 31.4 g/dL — ABNORMAL LOW (ref 32.0–36.0)
MCV: 82.4 fL (ref 80.0–100.0)
MPV: 8.9 fL (ref 7.5–12.5)
Monocytes Absolute: 378 cells/uL (ref 200–950)
Monocytes Relative: 6 %
NEUTROS ABS: 3339 {cells}/uL (ref 1500–7800)
Neutrophils Relative %: 53 %
PLATELETS: 355 10*3/uL (ref 140–400)
RBC: 4.25 MIL/uL (ref 3.80–5.10)
RDW: 17.9 % — ABNORMAL HIGH (ref 11.0–15.0)
WBC: 6.3 10*3/uL (ref 3.8–10.8)

## 2017-04-11 MED ORDER — ONDANSETRON HCL 4 MG PO TABS: 4.0000 mg | ORAL_TABLET | Freq: Three times a day (TID) | ORAL | 0 refills | Status: DC | PRN

## 2017-04-11 MED ORDER — NORTRIPTYLINE HCL 25 MG PO CAPS: 25.0000 mg | ORAL_CAPSULE | Freq: Every day | ORAL | 0 refills | Status: DC

## 2017-04-11 MED ORDER — PREDNISONE 10 MG PO TABS: 10.0000 mg | ORAL_TABLET | Freq: Two times a day (BID) | ORAL | 0 refills | Status: DC
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
## 2017-04-12 ENCOUNTER — Encounter: Payer: Self-pay | Admitting: Family Medicine

## 2017-04-12 ENCOUNTER — Ambulatory Visit
Admission: RE | Admit: 2017-04-12 | Discharge: 2017-04-12 | Disposition: A | Discharge status: Home / Self Care | Payer: 59 | Source: Ambulatory Visit | Attending: Family Medicine | Admitting: Family Medicine

## 2017-04-12 DIAGNOSIS — G4489 Other headache syndrome: Secondary | ICD-10-CM | POA: Insufficient documentation

## 2017-04-12 DIAGNOSIS — R51 Headache: Secondary | ICD-10-CM | POA: Diagnosis not present

## 2017-04-12 LAB — COMPLETE METABOLIC PANEL WITH GFR
ALK PHOS: 59 U/L (ref 33–115)
ALT: 12 U/L (ref 6–29)
AST: 11 U/L (ref 10–30)
Albumin: 3.8 g/dL (ref 3.6–5.1)
BUN: 6 mg/dL — ABNORMAL LOW (ref 7–25)
CALCIUM: 9.2 mg/dL (ref 8.6–10.2)
CO2: 22 mmol/L (ref 20–32)
Chloride: 106 mmol/L (ref 98–110)
Creat: 0.78 mg/dL (ref 0.50–1.10)
Glucose, Bld: 127 mg/dL — ABNORMAL HIGH (ref 65–99)
Potassium: 4.1 mmol/L (ref 3.5–5.3)
Sodium: 138 mmol/L (ref 135–146)
Total Bilirubin: 0.2 mg/dL (ref 0.2–1.2)
Total Protein: 6.7 g/dL (ref 6.1–8.1)

## 2017-04-13 ENCOUNTER — Encounter: Payer: Self-pay | Admitting: Family Medicine

## 2017-04-14 ENCOUNTER — Ambulatory Visit: Payer: 59 | Admitting: Family Medicine

## 2017-04-17 ENCOUNTER — Encounter: Payer: Self-pay | Admitting: Family Medicine

## 2017-04-17 ENCOUNTER — Ambulatory Visit (INDEPENDENT_AMBULATORY_CARE_PROVIDER_SITE_OTHER): Payer: 59 | Admitting: Family Medicine

## 2017-04-17 VITALS — BP 122/80 | HR 87 | Temp 98.6°F | Resp 16 | Ht 65.0 in | Wt 188.8 lb

## 2017-04-17 DIAGNOSIS — G4489 Other headache syndrome: Secondary | ICD-10-CM | POA: Diagnosis not present

## 2017-04-17 DIAGNOSIS — R112 Nausea with vomiting, unspecified: Secondary | ICD-10-CM
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
## 2017-04-18 ENCOUNTER — Encounter: Payer: 59 | Admitting: Obstetrics and Gynecology

## 2017-05-03 ENCOUNTER — Ambulatory Visit (INDEPENDENT_AMBULATORY_CARE_PROVIDER_SITE_OTHER): Payer: 59 | Admitting: Family Medicine

## 2017-05-03 ENCOUNTER — Encounter: Payer: Self-pay | Admitting: Family Medicine

## 2017-05-03 VITALS — BP 110/70 | HR 84 | Temp 98.2°F | Resp 16 | Ht 65.0 in | Wt 186.5 lb

## 2017-05-03 DIAGNOSIS — IMO0002 Reserved for concepts with insufficient information to code with codable children: Secondary | ICD-10-CM

## 2017-05-03 DIAGNOSIS — M791 Myalgia, unspecified site: Secondary | ICD-10-CM

## 2017-05-03 DIAGNOSIS — M2560 Stiffness of unspecified joint, not elsewhere classified: Secondary | ICD-10-CM

## 2017-05-03 NOTE — Addendum Note (Signed)
Addended by: Inda Coke on: 05/03/2017 02:31 PM   Modules accepted: Orders

## 2017-05-03 NOTE — Progress Notes (Signed)
Name: Sharon Herring   MRN: [id number]    DOB: 28-Aug-1975   Date:05/03/2017       Progress Note  Subjective  Chief Complaint  Chief Complaint  Patient presents with  . Follow-up  . Medication Refill    HPI  Body aches: she was given prednisone for headaches on 08/21 and states it helped with stiffness and muscle aches and would like if she could have a refill. She states when she wakes up she feels like her body is very tight, also has left shoulder and left ankle soreness, she feels achy on arms and legs most days, not sure what the triggers are. She continues to have edema during her cycles. She has been off [name] because of recent GI bug.    Patient Active Problem List   Diagnosis Date Noted  . Swelling of limb 01/27/2017  . B12 deficiency 03/28/2016  . Vitamin D deficiency 03/28/2016  . Hyperglycemia 03/28/2016  . Multiple gastric polyps 01/08/2016  . Menorrhagia 11/13/2015  . Chronic constipation 09/23/2015  . Obesity, Class I, BMI 30.0-34.9 (see actual BMI) 06/02/2015  . Iron deficiency anemia 03/14/2014  . History of uterine fibroid 03/14/2014    Past Surgical History:  Procedure Laterality Date  . APPENDECTOMY  2012  . ESOPHAGOGASTRODUODENOSCOPY (EGD) WITH PROPOFOL N/A 01/06/2016   Procedure: ESOPHAGOGASTRODUODENOSCOPY (EGD) WITH PROPOFOL;  Surgeon: Robert Bellow, MD;  Location: [facility] END[facility]COPY;  Service: Endoscopy;  Laterality: N/A;  . ROBOTIC ASSISTED LAPAROSCOPIC CHOLECYSTECTOMY N/A 03/03/2016   Procedure: ROBOTIC ASSISTED LAPAROSCOPIC CHOLECYSTECTOMY ;  Surgeon: Clayburn Pert, MD;  Location: [facility] ORS;  Service: General;  Laterality: N/A;  . UTERINE FIBROID SURGERY  2012    Family History  Problem Relation Age of Onset  . Hypertension Mother   . Hypertension Father     Social History   Social History  . Marital status: Single    Spouse name: N/A  . Number of children: N/A  . Years of education: N/A   Occupational History  . Not on file.    Social History Main Topics  . Smoking status: Never Smoker  . Smokeless tobacco: Never Used  . Alcohol use No  . Drug use: No  . Sexual activity: Yes    Birth control/ protection: None   Other Topics Concern  . Not on file   Social History Narrative  . No narrative on file     Current Outpatient Prescriptions:  .  BENZEPRO FOAMING CLOTHS 6 % MISC, Apply 1 each topically every other day., Disp: , Rfl:  .  cholecalciferol (VITAMIN D) 1000 units tablet, Take 1 tablet (1,000 Units total) by mouth daily., Disp: 30 tablet, Rfl: 0 .  furosemide (LASIX) 20 MG tablet, Take 1 tablet (20 mg total) by mouth daily., Disp: 30 tablet, Rfl: 1 .  Insulin Pen Needle (NOVOFINE) 32G X 6 MM MISC, 1 each by Does not apply route every morning., Disp: 100 each, Rfl: 5 .  Multiple Vitamins-Minerals (MULTIVITAMIN ADULT) TABS, Take 1 tablet by mouth daily., Disp: 30 tablet, Rfl: 0 .  nortriptyline (PAMELOR) 25 MG capsule, Take 1 capsule (25 mg total) by mouth at bedtime., Disp: 30 capsule, Rfl: 0 .  ondansetron (ZOFRAN) 4 MG tablet, Take 1 tablet (4 mg total) by mouth every 8 (eight) hours as needed for nausea or vomiting., Disp: 20 tablet, Rfl: 0 .  SULFACLEANSE 8/4 8-4 % SUSP, Apply 1 drop topically daily., Disp: , Rfl:  .  tranexamic acid (LYSTEDA) 650 MG TABS  tablet, Take 2 tablets (1,300 mg total) by mouth 3 (three) times daily. Take during menses for a maximum of five days, Disp: 30 tablet, Rfl: 2 .  cyclobenzaprine (FLEXERIL) 10 MG tablet, , Disp: , Rfl:  .  ferrous sulfate 325 (65 FE) MG tablet, Take 1 tablet (325 mg total) by mouth every other day. (Patient not taking: Reported on 05/03/2017), Disp: 30 tablet, Rfl: 0 .  linaclotide (LINZESS) 145 MCG CAPS capsule, Take 1 capsule (145 mcg total) by mouth daily before breakfast. (Patient not taking: Reported on 05/03/2017), Disp: 30 capsule, Rfl: 2 .  Liraglutide -Weight Management (SAXENDA) 18 MG/3ML SOPN, Inject 3 mg into the skin daily. (Patient not  taking: Reported on 05/03/2017), Disp: 5 pen, Rfl: 2  Allergies  Allergen Reactions  . Aspirin Nausea And Vomiting  . Oxycodone-Acetaminophen Rash and Shortness Of Breath  . Tape Other (See Comments)    Burning feeling. Has left mark on skin.  Melynda Keller [Lorcaserin Hcl] Other (See Comments)    headache  . Contrave [Naltrexone-Bupropion Hcl Er]   . Dicyclomine Nausea And Vomiting  . Norethindrone Rash     ROS  Constitutional: Negative for fever or weight change.  Respiratory: Negative for cough and shortness of breath.   Cardiovascular: Negative for chest pain or palpitations.  Gastrointestinal: Negative for abdominal pain, no bowel changes.  Musculoskeletal: Negative for gait problem or joint swelling.  Skin: Negative for rash.  Neurological: Negative for dizziness , positive for intermittent  headache.  No other specific complaints in a complete review of systems (except as listed in HPI above).  Objective  Vitals:   05/03/17 1258  BP: 110/70  Pulse: 84  Resp: 16  Temp: 98.2 F (36.8 C)  TempSrc: Oral  SpO2: 99%  Weight: 186 lb 8 oz (84.6 kg)  Height: _0  (1.651 m)    Body mass index is 31.04 kg/m.  Physical Exam  Constitutional: Patient appears well-developed and well-nourished. Obese  No distress.  HEENT: head atraumatic, normocephalic, pupils equal and reactive to light,  neck supple, throat within normal limits Cardiovascular: Normal rate, regular rhythm and normal heart sounds.  No murmur heard. No BLE edema. Pulmonary/Chest: Effort normal and breath sounds normal. No respiratory distress. Abdominal: Soft.  There is no tenderness. Psychiatric: Patient has a normal mood and affect. behavior is normal. Judgment and thought content normal. Muscular Skeletal: no synovitis, no joint swelling, erythema or increase in warmth  Recent Results (from the past 2160 hour(s))  CBC with Differential/Platelet     Status: Abnormal   Collection Time: 02/09/17  8:32 AM   Result Value Ref Range   WBC 7.1 3.6 - 11.0 K/uL   RBC 4.12 3.80 - 5.20 MIL/uL   Hemoglobin 10.4 (L) 12.0 - 16.0 g/dL   HCT 31.4 (L) 35.0 - 47.0 %   MCV 76.2 (L) 80.0 - 100.0 fL   MCH 25.3 (L) 26.0 - 34.0 pg   MCHC 33.2 32.0 - 36.0 g/dL   RDW 14.9 (H) 11.5 - 14.5 %   Platelets 500 (H) 150 - 440 K/uL   Neutrophils Relative % 54 %   Neutro Abs 3.8 1.4 - 6.5 K/uL   Lymphocytes Relative 37 %   Lymphs Abs 2.6 1.0 - 3.6 K/uL   Monocytes Relative 7 %   Monocytes Absolute 0.5 0.2 - 0.9 K/uL   Eosinophils Relative 1 %   Eosinophils Absolute 0.1 0 - 0.7 K/uL   Basophils Relative 1 %   Basophils Absolute  0.1 0 - 0.1 K/uL  Ferritin     Status: None   Collection Time: 02/09/17  8:32 AM  Result Value Ref Range   Ferritin 12 11 - 307 ng/mL  Sedimentation rate     Status: Abnormal   Collection Time: 02/09/17  8:32 AM  Result Value Ref Range   Sed Rate 44 (H) 0 - 20 mm/hr  COMPLETE METABOLIC PANEL WITH GFR     Status: Abnormal   Collection Time: 04/11/17  3:56 PM  Result Value Ref Range   Sodium 138 135 - 146 mmol/L   Potassium 4.1 3.5 - 5.3 mmol/L   Chloride 106 98 - 110 mmol/L   CO2 22 20 - 32 mmol/L    Comment: ** Please note change in reference range(s). **      Glucose, Bld 127 (H) 65 - 99 mg/dL   BUN 6 (L) 7 - 25 mg/dL   Creat 0.78 0.50 - 1.10 mg/dL   Total Bilirubin 0.2 0.2 - 1.2 mg/dL   Alkaline Phosphatase 59 33 - 115 U/L   AST 11 10 - 30 U/L   ALT 12 6 - 29 U/L   Total Protein 6.7 6.1 - 8.1 g/dL   Albumin 3.8 3.6 - 5.1 g/dL   Calcium 9.2 8.6 - 10.2 mg/dL   GFR, Est African American >89 >=60 mL/min   GFR, Est Non African American >89 >=60 mL/min  CBC with Differential/Platelet     Status: Abnormal   Collection Time: 04/11/17  3:56 PM  Result Value Ref Range   WBC 6.3 3.8 - 10.8 K/uL   RBC 4.25 3.80 - 5.10 MIL/uL   Hemoglobin 11.0 (L) 11.7 - 15.5 g/dL   HCT 35.0 35.0 - 45.0 %   MCV 82.4 80.0 - 100.0 fL   MCH 25.9 (L) 27.0 - 33.0 pg   MCHC 31.4 (L) 32.0 - 36.0 g/dL    RDW 17.9 (H) 11.0 - 15.0 %   Platelets 355 140 - 400 K/uL   MPV 8.9 7.5 - 12.5 fL   Neutro Abs 3,339 1,500 - 7,800 cells/uL   Lymphs Abs 2,520 850 - 3,900 cells/uL   Monocytes Absolute 378 200 - 950 cells/uL   Eosinophils Absolute 63 15 - 500 cells/uL   Basophils Absolute 0 0 - 200 cells/uL   Neutrophils Relative % 53 %   Lymphocytes Relative 40 %   Monocytes Relative 6 %   Eosinophils Relative 1 %   Basophils Relative 0 %   Smear Review Criteria for review not met      PHQ2/9: Depression screen [facility] 2/9 04/17/2017 04/07/2017 03/28/2016 12/16/2015 11/13/2015  Decreased Interest 0 0 0 0 0  Down, Depressed, Hopeless 0 0 0 0 0  PHQ - 2 Score 0 0 0 0 0    Fall Risk: Fall Risk  04/17/2017 04/07/2017 02/13/2017 03/28/2016 12/16/2015  Falls in the past year? _0       Assessment & Plan  1. Stiffness of extremity  - Rheumatoid factor - Lupus anticoagulant panel - C-reactive protein - Sedimentation rate - CK (Creatine Kinase)  2. Myalgia  Discussed FMS and taking Cymbalta if needed  - Rheumatoid factor - Lupus anticoagulant panel - C-reactive protein - Sedimentation rate - CK (Creatine Kinase)

## 2017-05-04 ENCOUNTER — Encounter: Payer: Self-pay | Admitting: Family Medicine

## 2017-05-04 LAB — RHEUMATOID FACTOR: Rhuematoid fact SerPl-aCnc: <14 IU/mL (ref <14)

## 2017-05-04 LAB — CK: CK TOTAL: 73 U/L (ref 29–143)

## 2017-05-04 LAB — C-REACTIVE PROTEIN: CRP: 5.2 mg/L (ref <8.0)

## 2017-05-04 LAB — SEDIMENTATION RATE: Sed Rate: 19 mm/h (ref 0–20)

## 2017-05-06 LAB — LUPUS ANTICOAGULANT EVAL W/ REFLEX
PTT LA SCREEN: 32 s (ref <=40)
dRVVT Screen: 39 s (ref <=45)

## 2017-05-09 DIAGNOSIS — G4730 Sleep apnea, unspecified: Secondary | ICD-10-CM | POA: Diagnosis not present

## 2017-05-10 ENCOUNTER — Ambulatory Visit (INDEPENDENT_AMBULATORY_CARE_PROVIDER_SITE_OTHER): Payer: 59 | Admitting: Obstetrics and Gynecology

## 2017-05-10 ENCOUNTER — Encounter: Payer: Self-pay | Admitting: Obstetrics and Gynecology

## 2017-05-10 VITALS — BP 109/70 | HR 88 | Ht 65.0 in | Wt 220.0 lb

## 2017-05-10 DIAGNOSIS — N920 Excessive and frequent menstruation with regular cycle: Secondary | ICD-10-CM

## 2017-05-10 DIAGNOSIS — D259 Leiomyoma of uterus, unspecified: Secondary | ICD-10-CM

## 2017-05-10 MED ORDER — TRANEXAMIC ACID 650 MG PO TABS: 1300.0000 mg | ORAL_TABLET | Freq: Three times a day (TID) | ORAL | 11 refills | Status: DC

## 2017-05-10 MED ORDER — IBUPROFEN 800 MG PO TABS: 800.0000 mg | ORAL_TABLET | Freq: Three times a day (TID) | ORAL | 3 refills | Status: DC | PRN

## 2017-05-10 NOTE — Progress Notes (Signed)
    GYNECOLOGY PROGRESS NOTE  Subjective:    Patient ID: Sharon Herring, female    DOB: 1976-08-03, 41 y.o.   MRN: [id number]  HPI  Patient is a 41 y.o. G0P0000 female who presents for 2 month follow-up of menorrhagia and fibroid uterus. Patient was previously counseled on all management options and placed on Lysteda last visit. Patient reports that overall her periods have improved. She is noted less cramping and a shorter cycle (used to last 7-8 days, however now down to 5 days). She does note that her flow is still relatively heavy, however she does note that she is having to change a super pads that she wears less frequently.  The following portions of the patient's history were reviewed and updated as appropriate: allergies, current medications, past family history, past medical history, past social history, past surgical history and problem list.  Review of Systems Pertinent items noted in HPI and remainder of comprehensive ROS otherwise negative.   Objective:   Blood pressure 109/70, pulse 88, height 5\' 5"  (1.651 m), weight 220 lb (99.8 kg), last menstrual period 04/15/2017. General appearance: alert and no distress Exam deferred.    Assessment:   Mnorrhagia with regular cycles Fibroid uterus  Plan:   - Patient overall note some improvement in her menstrual cycles using the Lysteda, and would like to continue use of the medication. She notes that her periods are now more manageable. Patient is still desiring to continue to maintain her fertility status as long as possible.  New prescription given an order for patient to continue medication. - To follow up as needed, or if symptoms worsen.   Rubie Maid, MD [facility]

## 2017-05-12 ENCOUNTER — Inpatient Hospital Stay: Payer: 59

## 2017-05-12 ENCOUNTER — Inpatient Hospital Stay: Payer: 59 | Attending: Hematology and Oncology

## 2017-05-12 ENCOUNTER — Inpatient Hospital Stay: Payer: 59 | Admitting: Hematology and Oncology

## 2017-05-12 ENCOUNTER — Ambulatory Visit: Payer: 59 | Admitting: Hematology and Oncology

## 2017-05-15 ENCOUNTER — Inpatient Hospital Stay: Payer: 59 | Admitting: Hematology and Oncology

## 2017-05-15 NOTE — Progress Notes (Unsigned)
[facility] day:  05/15/2017  Chief Complaint: Sharon Herring is a 41 y.o. female with iron deficiency anemia who is seen for 3 month assessment.  HPI: The patient was last seen in the hematology clinic on 02/10/2017.  At that time, she felt fatigued.  Menses were still heavy.  Ice pica had resolved.  Exam was unremarkable.  Hematocrit had drifted down to 31.4.  RBCs remained microcytic. We discussed follow-up with gynecology.  She received Feraheme 510 mg IV x 2 (02/13/2017 and 02/28/2017).  CBC on 04/11/2017 revealed a hematocrit of 35, hemoglobin 11.0, and MCV 82.4.  Symptomatically,   Past Medical History:  Diagnosis Date  . Anemia   . Dizziness   . Fibroid uterus   . GERD (gastroesophageal reflux disease)   . IBS (irritable bowel syndrome)   . Migraine   . Polyp of stomach 01-06-16   Stomach polyp, pyloric    Past Surgical History:  Procedure Laterality Date  . APPENDECTOMY  2012  . ESOPHAGOGASTRODUODENOSCOPY (EGD) WITH PROPOFOL N/A 01/06/2016   Procedure: ESOPHAGOGASTRODUODENOSCOPY (EGD) WITH PROPOFOL;  Surgeon: Robert Bellow, MD;  Location: [facility] END[facility]COPY;  Service: Endoscopy;  Laterality: N/A;  . ROBOTIC ASSISTED LAPAROSCOPIC CHOLECYSTECTOMY N/A 03/03/2016   Procedure: ROBOTIC ASSISTED LAPAROSCOPIC CHOLECYSTECTOMY ;  Surgeon: Clayburn Pert, MD;  Location: [facility] ORS;  Service: General;  Laterality: N/A;  . UTERINE FIBROID SURGERY  2012    Family History  Problem Relation Age of Onset  . Hypertension Mother   . Hypertension Father     Social History:  reports that she has never smoked. She has never used smokeless tobacco. She reports that she does not drink alcohol or use drugs.  She denies any exposure to radiation or toxins.  She works at [employer] as a Psychologist, sport and exercise.  She lives in [place].  The patient is alone today.  Allergies:  Allergies  Allergen Reactions  . Aspirin Nausea And Vomiting  .  Oxycodone-Acetaminophen Rash and Shortness Of Breath  . Tape Other (See Comments)    Burning feeling. Has left mark on skin.  Melynda Keller [Lorcaserin Hcl] Other (See Comments)    headache  . Contrave [Naltrexone-Bupropion Hcl Er]   . Dicyclomine Nausea And Vomiting  . Norethindrone Rash    Current Medications: Current Outpatient Prescriptions  Medication Sig Dispense Refill  . BENZEPRO FOAMING CLOTHS 6 % MISC Apply 1 each topically every other day.    . cholecalciferol (VITAMIN D) 1000 units tablet Take 1 tablet (1,000 Units total) by mouth daily. 30 tablet 0  . cyclobenzaprine (FLEXERIL) 10 MG tablet     . ferrous sulfate 325 (65 FE) MG tablet Take 1 tablet (325 mg total) by mouth every other day. 30 tablet 0  . furosemide (LASIX) 20 MG tablet Take 1 tablet (20 mg total) by mouth daily. 30 tablet 1  . ibuprofen (ADVIL,MOTRIN) 800 MG tablet Take 1 tablet (800 mg total) by mouth every 8 (eight) hours as needed. 60 tablet 3  . Insulin Pen Needle (NOVOFINE) 32G X 6 MM MISC 1 each by Does not apply route every morning. 100 each 5  . linaclotide (LINZESS) 145 MCG CAPS capsule Take 1 capsule (145 mcg total) by mouth daily before breakfast. 30 capsule 2  . Liraglutide -Weight Management (SAXENDA) 18 MG/3ML SOPN Inject 3 mg into the skin daily. 5 pen 2  . Multiple Vitamins-Minerals (MULTIVITAMIN ADULT) TABS Take 1 tablet by mouth daily. 30 tablet 0  .  nortriptyline (PAMELOR) 25 MG capsule Take 1 capsule (25 mg total) by mouth at bedtime. 30 capsule 0  . SULFACLEANSE 8/4 8-4 % SUSP Apply 1 drop topically daily.    . tranexamic acid (LYSTEDA) 650 MG TABS tablet Take 2 tablets (1,300 mg total) by mouth 3 (three) times daily. Take during menses for a maximum of five days 30 tablet 11   No current facility-administered medications for this visit.     Review of Systems:  GENERAL:  Feels "really tired".  No fevers or sweats.  Weight up 7 pounds. PERFORMANCE STATUS (ECOG):  0 HEENT:  No visual changes,  runny nose, sore throat, mouth sores or tenderness. Lungs: No shortness of breath or cough.  No hemoptysis. Cardiac:  No chest pain, palpitations, orthopnea, or PND. GI:  No nausea, vomiting, diarrhea, constipation, melena or hematochezia. GU:  No urgency, frequency, dysuria, or hematuria.  8 days of heavy flow. Musculoskeletal:  No back pain.  No joint pain.  No muscle tenderness. Extremities:  Interval lower extremity swelling (received Lasix). Skin:  No rashes or skin changes. Neuro:  No headache, numbness or weakness, balance or coordination issues. Endocrine:  No diabetes, thyroid issues, hot flashes or night sweats. Psych:  No mood changes, depression or anxiety. Pain:  No focal pain. Review of systems:  All other systems reviewed and found to be negative.  Physical Exam: Last menstrual period 04/15/2017. GENERAL:  Well developed, well nourished, woman sitting comfortably in the exam room in no acute distress. MENTAL STATUS:  Alert and oriented to person, place and time. HEAD:  Short brown hair.  Normocephalic, atraumatic, face symmetric, no Cushingoid features. EYES:  Glasses.  Brown eyes.  Pupils equal round and reactive to light and accomodation.  No conjunctivitis or scleral icterus. ENT:  Oropharynx clear without lesion.  Tongue normal. Mucous membranes moist.  RESPIRATORY:  Clear to auscultation without rales, wheezes or rhonchi. CARDIOVASCULAR:  Regular rate and rhythm without murmur, rub or gallop. ABDOMEN:  Soft, non-tender, with active bowel sounds, and no hepatosplenomegaly.  No masses. SKIN:  No rashes, ulcers or lesions. EXTREMITIES: No edema, no skin discoloration or tenderness.  No palpable cords. LYMPH NODES: No palpable cervical, supraclavicular, axillary or inguinal adenopathy  NEUROLOGICAL: Unremarkable. PSYCH:  Appropriate.   No visits with results within 3 Day(s) from this visit.  Latest known visit with results is:  Office Visit on 05/03/2017  Component  Date Value Ref Range Status  . Rhuematoid fact SerPl-aCnc 05/03/2017 <14  <14 IU/mL Final  . CRP 05/03/2017 5.2  <8.0 mg/L Final  . Sed Rate 05/03/2017 19  0 - 20 mm/h Final  . Total CK 05/03/2017 73  29 - 143 U/L Final  . Lupus Anticoagulant Eval 05/03/2017 see note   Final   Comment: A Lupus Anticoagulant is not detected. Marland Kitchen Reference Range:  Not Detected . http://education.questdiagnostics.com/faq/LupusAnticoag ------------------------------------------------------- . This interpretation is based on the following test results. .   . PTT LA SCREEN 05/03/2017 32  <=40 sec Final  . DRVVT MIX INTERPRETATION: 05/03/2017 Not Indicated   Final  . dRVVT Screen 05/03/2017 39  <=45 sec Final    Assessment:  HYDEIA MCATEE is a 41 y.o. female with iron deficiency anemia secondary to heavy menses.  She has a history of on and off anemia for several years (before 02/2014).  Diet is fairly good.  She denies any melena, hematochezia, or hematuria.  She has had ice pica x 2 months.  She has a history  of unresponsiveness to oral iron (4 month trial in 2015).  Labs on 07/21/2016 revealed a hematocrit of 30.9, hemoglobin 9.3, MCV 73.4, platelets 675,000, white count 5300 with an [place] of 2650. Creatinine was 0.7.  Liver function tests were normal.  Iron studies included a ferritin of 6, iron saturation 6% and TIBC of 440.  HIV testing was negative.  Labs on 08/25/2016 revealed a hematocrit of 28.3, hemoglobin 9.2, and MCV 69.6.  Ferritin was 5.  She received Feraheme 510 mg IV on 08/29/2016, 09/05/2016, 02/13/2017, and 02/28/2017.  Ferritin goal is 100.  Ferritin has been followed:  18 on 12/02/2015, 6 on 07/21/2016, 5 on 08/25/2016, 214 on 09/21/2016, 97 on 10/10/2016, 21 on 12/08/2016, and 12 on 02/09/2017.  She has B12 deficiency.  She received B12 shots x 2-3 months.  B12 was 462 on 07/21/2016.  Symptomatically, she feels fatigued.  Menses are still heavy.  Ice pica has resolved.  Exam is  unremarkable.  Hematocrit has drifted down to 31.4.  RBCs remain microcytic.   Plan:  1.  Labs today:  CBC with diff, ferritin, ESR, B12, folate. 2.  Review labs from 02/09/2017.  RBC are microcytic.  Iron stores are low.  Discuss plan for additional IV iron. 2.  Feraheme weekly x 2 doses (first dose today). 3.  Patient to turn in guaiac cards x 3; patient plans to complete these this week. 4.  Patient to follow-up with gynecology re: heavy menses. 5.  RTC in 3 months for MD assessment, labs (CBC with diff, ferritin,  ESR-day before) +/- Feraheme. 6.  Patient to call with continued heavy menses. If she calls with continued heavy menses, von Willebrand panel will be drawn (order pending).    Lequita Asal, MD  05/15/2017, 7:05 AM

## 2017-05-17 ENCOUNTER — Inpatient Hospital Stay: Payer: 59 | Attending: Hematology and Oncology

## 2017-05-24 ENCOUNTER — Encounter: Payer: Self-pay | Admitting: Family Medicine

## 2017-05-24 ENCOUNTER — Telehealth: Payer: Self-pay | Admitting: Emergency Medicine

## 2017-05-24 ENCOUNTER — Other Ambulatory Visit: Payer: Self-pay | Admitting: Family Medicine

## 2017-05-24 MED ORDER — FLUCONAZOLE 150 MG PO TABS: 150.0000 mg | ORAL_TABLET | ORAL | 0 refills | Status: DC

## 2017-05-24 NOTE — Telephone Encounter (Signed)
Can a script for diflucan be called to pharmacy. Used wrong soaps and now have yeast infection

## 2017-06-12 ENCOUNTER — Encounter: Payer: Self-pay | Admitting: Family Medicine

## 2017-08-05 ENCOUNTER — Ambulatory Visit (INDEPENDENT_AMBULATORY_CARE_PROVIDER_SITE_OTHER): Payer: 59 | Admitting: Family Medicine

## 2017-08-05 ENCOUNTER — Encounter: Payer: Self-pay | Admitting: Family Medicine

## 2017-08-05 VITALS — BP 122/80 | HR 86 | Resp 14 | Ht 65.0 in | Wt 223.8 lb

## 2017-08-05 DIAGNOSIS — Z113 Encounter for screening for infections with a predominantly sexual mode of transmission: Secondary | ICD-10-CM | POA: Diagnosis not present

## 2017-08-05 DIAGNOSIS — Z23 Encounter for immunization: Secondary | ICD-10-CM | POA: Diagnosis not present

## 2017-08-05 DIAGNOSIS — F418 Other specified anxiety disorders: Secondary | ICD-10-CM | POA: Diagnosis not present

## 2017-08-05 DIAGNOSIS — E559 Vitamin D deficiency, unspecified: Secondary | ICD-10-CM

## 2017-08-05 DIAGNOSIS — R739 Hyperglycemia, unspecified: Secondary | ICD-10-CM | POA: Diagnosis not present

## 2017-08-05 DIAGNOSIS — R600 Localized edema: Secondary | ICD-10-CM | POA: Diagnosis not present

## 2017-08-05 DIAGNOSIS — E669 Obesity, unspecified: Secondary | ICD-10-CM

## 2017-08-05 DIAGNOSIS — E786 Lipoprotein deficiency: Secondary | ICD-10-CM

## 2017-08-05 DIAGNOSIS — E538 Deficiency of other specified B group vitamins: Secondary | ICD-10-CM

## 2017-08-05 DIAGNOSIS — Z79899 Other long term (current) drug therapy: Secondary | ICD-10-CM | POA: Diagnosis not present

## 2017-08-05 MED ORDER — INSULIN PEN NEEDLE 32G X 6 MM MISC: 1.0000 | 5 refills | Status: DC

## 2017-08-05 MED ORDER — LIRAGLUTIDE -WEIGHT MANAGEMENT 18 MG/3ML ~~LOC~~ SOPN: 3.0000 mg | PEN_INJECTOR | Freq: Every day | SUBCUTANEOUS | 2 refills | Status: DC

## 2017-08-05 MED ORDER — TETANUS-DIPHTH-ACELL PERTUSSIS 5-2.5-18.5 LF-MCG/0.5 IM SUSP
0.5000 mL | Freq: Once | INTRAMUSCULAR | Status: AC
  Administered 2017-08-05: 0.5 mL via INTRAMUSCULAR

## 2017-08-05 MED ORDER — FUROSEMIDE 20 MG PO TABS: 20.0000 mg | ORAL_TABLET | Freq: Every day | ORAL | 1 refills | Status: DC

## 2017-08-05 NOTE — Progress Notes (Signed)
Name: Sharon Herring   MRN: [id number]    DOB: [birth year]   Date:08/05/2017       Progress Note  Subjective  Chief Complaint  Chief Complaint  Patient presents with  . Depression  . Obesity    HPI   Obesity/Prediabetes:  she states she started to gain  weight at age 41. She states it happened when her father was diagnosed with brain cancer and she was eating because of stress. The highest weight was 223.8lbs ( current weight) . She had tried exercise and portion control without much help. She has also tried Phentermine and lost 8-9 lbs and was able to keep it down for about one year. She has also tried Contrave that made her feel groggy and Belviq that caused headaches. She started Saxenda at a weight of 217 lbs on 03/28/2016. Her weight went as low as 170 lbs. She stopped using Saxenda and weight has been trending up, she would like a refill of medication. She is also walking daily. She changed her diet this week.   Menorrhagia: she had uterine fibroids removed 2011, he still has heavy cycles, lasts about 1 week, regular, about every 28 days, this past cycle was the heaviest and she decided to come in for follow up. Not taking ocp. She was not able to tolerate ocp, she broke out in a rash and does not want to try anything else at this time. She also has iron deficiency anemia and sees Hematologist Dr. Mike Gip, had iron infusion and iron storage has improved, taking otc iron tablets a few times a week, but still anemic We will recheck labs for Dr. Mike Gip  B12 and Vitamin D deficiency: taking otc vitamin D, last level was very low, we will recheck labs  Chronic constipation: she states she is doing well, bowel movements once a day, taking Linzess only a few times a month. No straining, or blood in stools.   Depression/anxiety/insomnia: she has noticed feeling down since the week before she turned 41. She has switched jobs and missed the clinical aspect of her previous job. She has noticed  difficulty falling asleep, also at times she feels down, trying to walk more. Frustrated about her weight - eats when upset. She does not want medication and denies suicidal thoughts or ideation  Patient Active Problem List   Diagnosis Date Noted  . Swelling of limb 01/27/2017  . B12 deficiency 03/28/2016  . Vitamin D deficiency 03/28/2016  . Hyperglycemia 03/28/2016  . Multiple gastric polyps 01/08/2016  . Menorrhagia 11/13/2015  . Chronic constipation 09/23/2015  . Obesity, Class I, BMI 30.0-34.9 (see actual BMI) 06/02/2015  . Iron deficiency anemia 03/14/2014  . History of uterine fibroid 03/14/2014    Past Surgical History:  Procedure Laterality Date  . APPENDECTOMY  2012  . ESOPHAGOGASTRODUODENOSCOPY (EGD) WITH PROPOFOL N/A 01/06/2016   Procedure: ESOPHAGOGASTRODUODENOSCOPY (EGD) WITH PROPOFOL;  Surgeon: Robert Bellow, MD;  Location: [facility] END[facility]COPY;  Service: Endoscopy;  Laterality: N/A;  . ROBOTIC ASSISTED LAPAROSCOPIC CHOLECYSTECTOMY N/A 03/03/2016   Procedure: ROBOTIC ASSISTED LAPAROSCOPIC CHOLECYSTECTOMY ;  Surgeon: Clayburn Pert, MD;  Location: [facility] ORS;  Service: General;  Laterality: N/A;  . UTERINE FIBROID SURGERY  2012    Family History  Problem Relation Age of Onset  . Hypertension Mother   . Hypertension Father     Social History   Socioeconomic History  . Marital status: Single    Spouse name: Not on file  . Number of children: 0  .  Years of education: Not on file  . Highest education level: Not on file  Social Needs  . Financial resource strain: Not hard at all  . Food insecurity - worry: Never true  . Food insecurity - inability: Never true  . Transportation needs - medical: No  . Transportation needs - non-medical: No  Occupational History  . Not on file  Tobacco Use  . Smoking status: Never Smoker  . Smokeless tobacco: Never Used  Substance and Sexual Activity  . Alcohol use: No    Alcohol/week: 0.0 oz  . Drug use: No  . Sexual activity:  Yes    Birth control/protection: Condom  Other Topics Concern  . Not on file  Social History Narrative   Lives alone, works for [employer] at the [employer]      Current Outpatient Medications:  .  BENZEPRO FOAMING CLOTHS 6 % MISC, Apply 1 each topically every other day., Disp: , Rfl:  .  cholecalciferol (VITAMIN D) 1000 units tablet, Take 1 tablet (1,000 Units total) by mouth daily., Disp: 30 tablet, Rfl: 0 .  furosemide (LASIX) 20 MG tablet, Take 1 tablet (20 mg total) by mouth daily., Disp: 30 tablet, Rfl: 1 .  ibuprofen (ADVIL,MOTRIN) 800 MG tablet, Take 1 tablet (800 mg total) by mouth every 8 (eight) hours as needed., Disp: 60 tablet, Rfl: 3 .  linaclotide (LINZESS) 145 MCG CAPS capsule, Take 1 capsule (145 mcg total) by mouth daily before breakfast., Disp: 30 capsule, Rfl: 2 .  Multiple Vitamins-Minerals (MULTIVITAMIN ADULT) TABS, Take 1 tablet by mouth daily., Disp: 30 tablet, Rfl: 0 .  SULFACLEANSE 8/4 8-4 % SUSP, Apply 1 drop topically daily., Disp: , Rfl:  .  tranexamic acid (LYSTEDA) 650 MG TABS tablet, Take 2 tablets (1,300 mg total) by mouth 3 (three) times daily. Take during menses for a maximum of five days, Disp: 30 tablet, Rfl: 11 .  Insulin Pen Needle (NOVOFINE) 32G X 6 MM MISC, 1 each by Does not apply route every morning., Disp: 100 each, Rfl: 5 .  iron polysaccharides (NIFEREX) 150 MG capsule, Take 150 mg by mouth every other day., Disp: , Rfl:  .  Liraglutide -Weight Management (SAXENDA) 18 MG/3ML SOPN, Inject 3 mg into the skin daily., Disp: 5 pen, Rfl: 2  Allergies  Allergen Reactions  . Aspirin Nausea And Vomiting  . Oxycodone-Acetaminophen Rash and Shortness Of Breath  . Tape Other (See Comments)    Burning feeling. Has left mark on skin.  Melynda Keller [Lorcaserin Hcl] Other (See Comments)    headache  . Contrave [Naltrexone-Bupropion Hcl Er]   . Dicyclomine Nausea And Vomiting  . Norethindrone Rash     ROS  Constitutional: Negative for fever or weight change.   Respiratory: Negative for cough and shortness of breath.   Cardiovascular: Negative for chest pain or palpitations.  Gastrointestinal: Negative for abdominal pain, no bowel changes.  Musculoskeletal: Negative for gait problem or joint swelling.  Skin: Negative for rash.  Neurological: Negative for dizziness or headache.  No other specific complaints in a complete review of systems (except as listed in HPI above).  Objective  Vitals:   08/05/17 1112  BP: 122/80  Pulse: 86  Resp: 14  SpO2: 98%  Weight: 223 lb 12.8 oz (101.5 kg)  Height: 5\' 5"  (1.651 m)    Body mass index is 37.24 kg/m.  Physical Exam  Constitutional: Patient appears well-developed and well-nourished. Obese  No distress.  HEENT: head atraumatic, normocephalic, pupils equal and reactive  to light,neck supple, throat within normal limits Cardiovascular: Normal rate, regular rhythm and normal heart sounds.  No murmur heard. No BLE edema. Pulmonary/Chest: Effort normal and breath sounds normal. No respiratory distress. Abdominal: Soft.  There is no tenderness. Psychiatric: Patient has a normal mood and affect. behavior is normal. Judgment and thought content normal.  PHQ2/9: Depression screen [facility] 2/9 08/05/2017 04/17/2017 04/07/2017 03/28/2016 12/16/2015  Decreased Interest 0 0 0 0 0  Down, Depressed, Hopeless 1 0 0 0 0  PHQ - 2 Score 1 0 0 0 0  Altered sleeping 2 - - - -  Tired, decreased energy 1 - - - -  Change in appetite 1 - - - -  Feeling bad or failure about yourself  0 - - - -  Trouble concentrating 1 - - - -  Moving slowly or fidgety/restless 0 - - - -  Suicidal thoughts 0 - - - -  PHQ-9 Score 6 - - - -  Difficult doing work/chores Not difficult at all - - - -     Fall Risk: Fall Risk  08/05/2017 04/17/2017 04/07/2017 02/13/2017 03/28/2016  Falls in the past year? No No No No No     Functional Status Survey: Is the patient deaf or have difficulty hearing?: No Does the patient have difficulty seeing,  even when wearing glasses/contacts?: No Does the patient have difficulty concentrating, remembering, or making decisions?: No Does the patient have difficulty walking or climbing stairs?: No Does the patient have difficulty dressing or bathing?: No Does the patient have difficulty doing errands alone such as visiting a doctor's office or shopping?: No    Assessment & Plan  1. Hyperglycemia  - Liraglutide -Weight Management (SAXENDA) 18 MG/3ML SOPN; Inject 3 mg into the skin daily.  Dispense: 5 pen; Refill: 2 - Hemoglobin A1c  2. Need for Tdap vaccination  - Tdap (BOOSTRIX) injection 0.5 mL  3. Obesity, Class I, BMI 30.0-34.9 (see actual BMI)  - Liraglutide -Weight Management (SAXENDA) 18 MG/3ML SOPN; Inject 3 mg into the skin daily.  Dispense: 5 pen; Refill: 2 - Insulin Pen Needle (NOVOFINE) 32G X 6 MM MISC; 1 each by Does not apply route every morning.  Dispense: 100 each; Refill: 5  4. Depression with anxiety  - TSH - counseling, she will seek help through the hospital/employee health  5. Vitamin D deficiency  - VITAMIN D 25 Hydroxy (Vit-D Deficiency, Fractures)  6. B12 deficiency  - Vitamin B12  7. Low HDL (under 40)  - Lipid panel  8. Routine screening for STI (sexually transmitted infection)  - HIV antibody - RPR - Hepatitis, Acute  9. Edema of both legs  - furosemide (LASIX) 20 MG tablet; Take 1 tablet (20 mg total) by mouth daily.  Dispense: 30 tablet; Refill: 1  10. Long-term use of high-risk medication  - COMPLETE METABOLIC PANEL WITH GFR

## 2017-08-21 ENCOUNTER — Telehealth: Payer: 59 | Admitting: Family

## 2017-08-21 DIAGNOSIS — J069 Acute upper respiratory infection, unspecified: Secondary | ICD-10-CM | POA: Diagnosis not present

## 2017-08-21 MED ORDER — BENZONATATE 100 MG PO CAPS: 100.0000 mg | ORAL_CAPSULE | Freq: Three times a day (TID) | ORAL | 0 refills | Status: DC | PRN

## 2017-08-21 MED ORDER — FLUTICASONE PROPIONATE 50 MCG/ACT NA SUSP: 2.0000 | Freq: Every day | NASAL | 6 refills | Status: DC

## 2017-08-21 NOTE — Progress Notes (Signed)

## 2017-09-22 ENCOUNTER — Encounter: Payer: Self-pay | Admitting: Psychiatry

## 2017-09-22 ENCOUNTER — Ambulatory Visit (INDEPENDENT_AMBULATORY_CARE_PROVIDER_SITE_OTHER): Payer: Self-pay | Admitting: Psychiatry

## 2017-09-22 VITALS — BP 125/81 | HR 80 | Temp 98.4°F | Wt 196.0 lb

## 2017-09-22 DIAGNOSIS — K529 Noninfective gastroenteritis and colitis, unspecified: Secondary | ICD-10-CM

## 2017-09-22 NOTE — Patient Instructions (Signed)
Food Choices to Help Relieve Diarrhea, Adult  When you have diarrhea, the foods you eat and your eating habits are very important. Choosing the right foods and drinks can help:  · Relieve diarrhea.  · Replace lost fluids and nutrients.  · Prevent dehydration.    What general guidelines should I follow?  Relieving diarrhea  · Choose foods with less than 2 g or .07 oz. of fiber per serving.  · Limit fats to less than 8 tsp (38 g or 1.34 oz.) a day.  · Avoid the following:  ? Foods and beverages sweetened with high-fructose corn syrup, honey, or sugar alcohols such as xylitol, sorbitol, and mannitol.  ? Foods that contain a lot of fat or sugar.  ? Fried, greasy, or spicy foods.  ? High-fiber grains, breads, and cereals.  ? Raw fruits and vegetables.  · Eat foods that are rich in probiotics. These foods include dairy products such as yogurt and fermented milk products. They help increase healthy bacteria in the stomach and intestines (gastrointestinal tract, or GI tract).  · If you have lactose intolerance, avoid dairy products. These may make your diarrhea worse.  · Take medicine to help stop diarrhea (antidiarrheal medicine) only as told by your health care provider.  Replacing nutrients  · Eat small meals or snacks every 3–4 hours.  · Eat bland foods, such as white rice, toast, or baked potato, until your diarrhea starts to get better. Gradually reintroduce nutrient-rich foods as tolerated or as told by your health care provider. This includes:  ? Well-cooked protein foods.  ? Peeled, seeded, and soft-cooked fruits and vegetables.  ? Low-fat dairy products.  · Take vitamin and mineral supplements as told by your health care provider.  Preventing dehydration    · Start by sipping water or a special solution to prevent dehydration (oral rehydration solution, ORS). Urine that is clear or pale yellow means that you are getting enough fluid.  · Try to drink at least 8–10 cups of fluid each day to help replace lost  fluids.  · You may add other liquids in addition to water, such as clear juice or decaffeinated sports drinks, as tolerated or as told by your health care provider.  · Avoid drinks with caffeine, such as coffee, tea, or soft drinks.  · Avoid alcohol.  What foods are recommended?  The items listed may not be a complete list. Talk with your health care provider about what dietary choices are best for you.  Grains  White rice. White, French, or pita breads (fresh or toasted), including plain rolls, buns, or bagels. White pasta. Saltine, soda, or graham crackers. Pretzels. Low-fiber cereal. Cooked cereals made with water (such as cornmeal, farina, or cream cereals). Plain muffins. Matzo. Melba toast. Zwieback.  Vegetables  Potatoes (without the skin). Most well-cooked and canned vegetables without skins or seeds. Tender lettuce.  Fruits  Apple sauce. Fruits canned in juice. Cooked apricots, cherries, grapefruit, peaches, pears, or plums. Fresh bananas and cantaloupe.  Meats and other protein foods  Baked or boiled chicken. Eggs. Tofu. Fish. Seafood. Smooth nut butters. Ground or well-cooked tender beef, ham, veal, lamb, pork, or poultry.  Dairy  Plain yogurt, kefir, and unsweetened liquid yogurt. Lactose-free milk, buttermilk, skim milk, or soy milk. Low-fat or nonfat hard cheese.  Beverages  Water. Low-calorie sports drinks. Fruit juices without pulp. Strained tomato and vegetable juices. Decaffeinated teas. Sugar-free beverages not sweetened with sugar alcohols. Oral rehydration solutions, if approved by your health care   provider.  Seasoning and other foods  Bouillon, broth, or soups made from recommended foods.  What foods are not recommended?  The items listed may not be a complete list. Talk with your health care provider about what dietary choices are best for you.  Grains  Whole grain, whole wheat, bran, or rye breads, rolls, pastas, and crackers. Wild or brown rice. Whole grain or bran cereals. Barley. Oats  and oatmeal. Corn tortillas or taco shells. Granola. Popcorn.  Vegetables  Raw vegetables. Fried vegetables. Cabbage, broccoli, Brussels sprouts, artichokes, baked beans, beet greens, corn, kale, legumes, peas, sweet potatoes, and yams. Potato skins. Cooked spinach and cabbage.  Fruits  Dried fruit, including raisins and dates. Raw fruits. Stewed or dried prunes. Canned fruits with syrup.  Meat and other protein foods  Fried or fatty meats. Deli meats. Chunky nut butters. Nuts and seeds. Beans and lentils. Bacon. Hot dogs. Sausage.  Dairy  High-fat cheeses. Whole milk, chocolate milk, and beverages made with milk, such as milk shakes. Half-and-half. Cream. sour cream. Ice cream.  Beverages  Caffeinated beverages (such as coffee, tea, soda, or energy drinks). Alcoholic beverages. Fruit juices with pulp. Prune juice. Soft drinks sweetened with high-fructose corn syrup or sugar alcohols. High-calorie sports drinks.  Fats and oils  Butter. Cream sauces. Margarine. Salad oils. Plain salad dressings. Olives. Avocados. Mayonnaise.  Sweets and desserts  Sweet rolls, doughnuts, and sweet breads. Sugar-free desserts sweetened with sugar alcohols such as xylitol and sorbitol.  Seasoning and other foods  Honey. Hot sauce. Chili powder. Gravy. Cream-based or milk-based soups. Pancakes and waffles.  Summary  · When you have diarrhea, the foods you eat and your eating habits are very important.  · Make sure you get at least 8–10 cups of fluid each day, or enough to keep your urine clear or pale yellow.  · Eat bland foods and gradually reintroduce healthy, nutrient-rich foods as tolerated, or as told by your health care provider.  · Avoid high-fiber, fried, greasy, or spicy foods.  This information is not intended to replace advice given to you by your health care provider. Make sure you discuss any questions you have with your health care provider.  Document Released: 10/29/2003 Document Revised: 08/05/2016 Document  Reviewed: 08/05/2016  [employer]vier Interactive Patient Education © 2018 [employer]

## 2017-09-22 NOTE — Progress Notes (Signed)
  Subjective:    Sharon Herring is a 42 y.o. female with a PMH significant for IBS and GERD who presents for evaluation of abdominal cramping, diarrhea, nausea and vomiting since last night. Patient reported a fever of 101.8 last night with abdominal cramping 5/10. Reported she has had about 5 episodes of diarreha since last night but the last episode seems to be more formed. She reports vomiting a yellowish liquid about 0500 this morning. States she took her Zofran and that was helpful. Patient denies hematemesis and hematuria. Patient's oral intake has been average. Patient's urine output has been adequate. Patient reported eating shrimp from [employer] yesterday evening prior to going to bed and having this symptoms. Patient have not taken any other medications for her symptoms.   The following portions of the patient's history were reviewed and updated as appropriate: allergies, current medications, past family history, past medical history, past social history, past surgical history and problem list.  Review of Systems Pertinent items are noted in HPI.    Objective:     General appearance: alert, cooperative and no distress Lungs: clear to auscultation bilaterally Heart: regular rate and rhythm, S1, S2 normal, no murmur, click, rub or gallop Abdomen: normal findings: bowel sounds normal and abnormal findings:  moderate tenderness in the periumbilical area, in the RUQ, in the RLQ, in the LUQ and in the LLQ    Assessment:    Acute Gastroenteritis    Plan:  I suspect her symptoms are most likely associated to food poison. Patient is afebrile now and does not appear to be in distress.  -Assurance given that symptoms are mostlikly self limiting. Instructed to return to the clinic or go to the ed with worsening symptoms.  -Take Tylenol as needed for abdominal pain. -Continue Zofran as needed -Discussed oral rehydration, reintroduction of solid foods, signs of dehydration.  concerns. -Follow  up in PRN or sooner as needed.

## 2017-10-16 ENCOUNTER — Encounter: Payer: Self-pay | Admitting: Family Medicine

## 2017-11-01 ENCOUNTER — Encounter: Payer: Self-pay | Admitting: Family Medicine

## 2017-11-01 ENCOUNTER — Ambulatory Visit (INDEPENDENT_AMBULATORY_CARE_PROVIDER_SITE_OTHER): Payer: Self-pay | Admitting: Family Medicine

## 2017-11-01 VITALS — BP 116/76 | HR 82 | Temp 98.4°F | Resp 14 | Ht 65.0 in | Wt 188.9 lb

## 2017-11-01 DIAGNOSIS — E786 Lipoprotein deficiency: Secondary | ICD-10-CM

## 2017-11-01 DIAGNOSIS — R600 Localized edema: Secondary | ICD-10-CM

## 2017-11-01 DIAGNOSIS — E669 Obesity, unspecified: Secondary | ICD-10-CM

## 2017-11-01 DIAGNOSIS — R739 Hyperglycemia, unspecified: Secondary | ICD-10-CM

## 2017-11-01 DIAGNOSIS — J069 Acute upper respiratory infection, unspecified: Secondary | ICD-10-CM

## 2017-11-01 MED ORDER — HYDROCOD POLST-CPM POLST ER 10-8 MG/5ML PO SUER: 5.0000 mL | Freq: Two times a day (BID) | ORAL | 0 refills | Status: DC | PRN

## 2017-11-01 MED ORDER — FUROSEMIDE 20 MG PO TABS: 20.0000 mg | ORAL_TABLET | Freq: Every day | ORAL | 1 refills | Status: DC

## 2017-11-01 MED ORDER — PHENTERMINE-TOPIRAMATE ER 7.5-46 MG PO CP24: 1.0000 | ORAL_CAPSULE | Freq: Every day | ORAL | 0 refills | Status: DC

## 2017-11-01 MED ORDER — PHENTERMINE-TOPIRAMATE ER 3.75-23 MG PO CP24: 1.0000 | ORAL_CAPSULE | Freq: Every day | ORAL | 0 refills | Status: DC

## 2017-11-01 NOTE — Progress Notes (Signed)
Name: Sharon Herring   MRN: [id number]    DOB: 1976-04-21   Date:11/01/2017       Progress Note  Subjective  Chief Complaint  Chief Complaint  Patient presents with  . URI    She has symptoms that include congestion, sore throat, nasal congestion, dyspnea, her throat burns and hurts to swallow. She has had these symtpoms x 3 days. She has treated symptoms with Allergy Medication and Mucinex with no relief.    HPI  URI: she states multiple co-workers have same symptoms. She states that for the past 3 days she has noticed nasal congestion, facial pressure, nocturnal cough, sore throat - felt raw yesterday and now scratchy. Taking otc medication and some oils, but not getting better. No wheezing or SOB  Obesity: she took [name] in the past with good results, but no longer covered by insurance. She has been going to a boot camp and has lost weight 25 lbs since Dec 2018. She is trying to eat healthy, 1500 calorie diet, but would like to try Qsymia. Discussed risk of Topamax with Qsymia.   Dyslipidemia: low HDL back 06/2016, discussed life style modification  Edema lower extremity: usually before and during cycles, she needs refill of fluid pills  Hyperglycemia: denies polydipsia, polyuria or polyphagia   Patient Active Problem List   Diagnosis Date Noted  . Swelling of limb 01/27/2017  . B12 deficiency 03/28/2016  . Vitamin D deficiency 03/28/2016  . Hyperglycemia 03/28/2016  . Multiple gastric polyps 01/08/2016  . Menorrhagia 11/13/2015  . Chronic constipation 09/23/2015  . Obesity, Class I, BMI 30.0-34.9 (see actual BMI) 06/02/2015  . Iron deficiency anemia 03/14/2014  . History of uterine fibroid 03/14/2014    Past Surgical History:  Procedure Laterality Date  . APPENDECTOMY  2012  . ESOPHAGOGASTRODUODENOSCOPY (EGD) WITH PROPOFOL N/A 01/06/2016   Procedure: ESOPHAGOGASTRODUODENOSCOPY (EGD) WITH PROPOFOL;  Surgeon: Robert Bellow, MD;  Location: [facility] ENDOSCOPY;  Service:  Endoscopy;  Laterality: N/A;  . ROBOTIC ASSISTED LAPAROSCOPIC CHOLECYSTECTOMY N/A 03/03/2016   Procedure: ROBOTIC ASSISTED LAPAROSCOPIC CHOLECYSTECTOMY ;  Surgeon: Clayburn Pert, MD;  Location: [facility] ORS;  Service: General;  Laterality: N/A;  . UTERINE FIBROID SURGERY  2012    Family History  Problem Relation Age of Onset  . Hypertension Mother   . Hypertension Father     Social History   Socioeconomic History  . Marital status: Single    Spouse name: Not on file  . Number of children: 0  . Years of education: Not on file  . Highest education level: Not on file  Social Needs  . Financial resource strain: Not hard at all  . Food insecurity - worry: Never true  . Food insecurity - inability: Never true  . Transportation needs - medical: No  . Transportation needs - non-medical: No  Occupational History  . Not on file  Tobacco Use  . Smoking status: Never Smoker  . Smokeless tobacco: Never Used  Substance and Sexual Activity  . Alcohol use: No    Alcohol/week: 0.0 oz  . Drug use: No  . Sexual activity: Yes    Birth control/protection: Condom  Other Topics Concern  . Not on file  Social History Narrative   Lives alone, works for [employer] at the [employer]      Current Outpatient Medications:  .  BENZEPRO FOAMING CLOTHS 6 % MISC, Apply 1 each topically every other day., Disp: , Rfl:  .  cholecalciferol (VITAMIN D) 1000 units tablet, Take 1  tablet (1,000 Units total) by mouth daily., Disp: 30 tablet, Rfl: 0 .  fluticasone (FLONASE) 50 MCG/ACT nasal spray, Place 2 sprays into both nostrils daily., Disp: 16 g, Rfl: 6 .  furosemide (LASIX) 20 MG tablet, Take 1 tablet (20 mg total) by mouth daily., Disp: 30 tablet, Rfl: 1 .  ibuprofen (ADVIL,MOTRIN) 800 MG tablet, Take 1 tablet (800 mg total) by mouth every 8 (eight) hours as needed., Disp: 60 tablet, Rfl: 3 .  iron polysaccharides (NIFEREX) 150 MG capsule, Take 150 mg by mouth every other day., Disp: , Rfl:  .  linaclotide (LINZESS)  145 MCG CAPS capsule, Take 1 capsule (145 mcg total) by mouth daily before breakfast., Disp: 30 capsule, Rfl: 2 .  Multiple Vitamins-Minerals (MULTIVITAMIN ADULT) TABS, Take 1 tablet by mouth daily., Disp: 30 tablet, Rfl: 0 .  SULFACLEANSE 8/4 8-4 % SUSP, Apply 1 drop topically daily., Disp: , Rfl:  .  tranexamic acid (LYSTEDA) 650 MG TABS tablet, Take 2 tablets (1,300 mg total) by mouth 3 (three) times daily. Take during menses for a maximum of five days, Disp: 30 tablet, Rfl: 11 .  chlorpheniramine-HYDROcodone (TUSSIONEX PENNKINETIC ER) 10-8 MG/5ML SUER, Take 5 mLs by mouth every 12 (twelve) hours as needed., Disp: 140 mL, Rfl: 0 .  Phentermine-Topiramate (QSYMIA) 3.75-23 MG CP24, Take 1 tablet by mouth daily., Disp: 14 capsule, Rfl: 0 .  Phentermine-Topiramate (QSYMIA) 7.5-46 MG CP24, Take 1 tablet by mouth daily., Disp: 30 capsule, Rfl: 0  Allergies  Allergen Reactions  . Aspirin Nausea And Vomiting  . Oxycodone-Acetaminophen Rash and Shortness Of Breath  . Tape Other (See Comments)    Burning feeling. Has left mark on skin.  Melynda Keller [Lorcaserin Hcl] Other (See Comments)    headache  . Contrave [Naltrexone-Bupropion Hcl Er]   . Dicyclomine Nausea And Vomiting  . Norethindrone Rash     ROS  Constitutional: Negative for fever, positive for  weight change.  Respiratory: positive  for cough but no  shortness of breath.   Cardiovascular: Negative for chest pain or palpitations.  Gastrointestinal: Negative for abdominal pain, no bowel changes.  Musculoskeletal: Negative for gait problem or joint swelling.  Skin: Negative for rash.  Neurological: Negative for dizziness or headache.  No other specific complaints in a complete review of systems (except as listed in HPI above).  Objective  Vitals:   11/01/17 1412  BP: 116/76  Pulse: 82  Resp: 14  Temp: 98.4 F (36.9 C)  TempSrc: Oral  SpO2: 99%  Weight: 188 lb 14.4 oz (85.7 kg)  Height: 5\' 5"  (1.651 m)    Body mass index is  31.43 kg/m.  Physical Exam  Constitutional: Patient appears well-developed and well-nourished. Obese No distress.  HEENT: head atraumatic, normocephalic, pupils equal and reactive to light, ears normal TM, boggy turbinates , clear rhinorrhea, neck supple, throat within normal limits Cardiovascular: Normal rate, regular rhythm and normal heart sounds.  No murmur heard. No BLE edema. Pulmonary/Chest: Effort normal and breath sounds normal. No respiratory distress. Abdominal: Soft.  There is no tenderness. Psychiatric: Patient has a normal mood and affect. behavior is normal. Judgment and thought content normal.  PHQ2/9: Depression screen [facility] 2/9 08/05/2017 04/17/2017 04/07/2017 03/28/2016 12/16/2015  Decreased Interest 0 0 0 0 0  Down, Depressed, Hopeless 1 0 0 0 0  PHQ - 2 Score 1 0 0 0 0  Altered sleeping 2 - - - -  Tired, decreased energy 1 - - - -  Change in appetite  1 - - - -  Feeling bad or failure about yourself  0 - - - -  Trouble concentrating 1 - - - -  Moving slowly or fidgety/restless 0 - - - -  Suicidal thoughts 0 - - - -  PHQ-9 Score 6 - - - -  Difficult doing work/chores Not difficult at all - - - -     Fall Risk: Fall Risk  11/01/2017 08/05/2017 04/17/2017 04/07/2017 02/13/2017  Falls in the past year? No No No No No     Functional Status Survey: Is the patient deaf or have difficulty hearing?: No Does the patient have difficulty seeing, even when wearing glasses/contacts?: No Does the patient have difficulty concentrating, remembering, or making decisions?: No Does the patient have difficulty walking or climbing stairs?: No Does the patient have difficulty dressing or bathing?: No Does the patient have difficulty doing errands alone such as visiting a doctor's office or shopping?: No    Assessment & Plan  1. URI, acute  - chlorpheniramine-HYDROcodone (TUSSIONEX PENNKINETIC ER) 10-8 MG/5ML SUER; Take 5 mLs by mouth every 12 (twelve) hours as needed.  Dispense:  140 mL; Refill: 0  2. Obesity, Class I, BMI 30.0-34.9 (see actual BMI)  - Phentermine-Topiramate (QSYMIA) 3.75-23 MG CP24; Take 1 tablet by mouth daily.  Dispense: 14 capsule; Refill: 0 - Phentermine-Topiramate (QSYMIA) 7.5-46 MG CP24; Take 1 tablet by mouth daily.  Dispense: 30 capsule; Refill: 0  3. Edema of both legs  - furosemide (LASIX) 20 MG tablet; Take 1 tablet (20 mg total) by mouth daily.  Dispense: 30 tablet; Refill: 1   4. Hyperglycemia  Check labs next visit  5. Low HDL (under 40)  Lipid panel shows low HDL : to improve HDL patient  needs to eat tree nuts ( pecans/pistachios/almonds ) four times weekly, eat fish two times weekly  and exercise  at least 150 minutes per week

## 2017-11-10 ENCOUNTER — Ambulatory Visit (HOSPITAL_COMMUNITY)
Admission: EM | Admit: 2017-11-10 | Discharge: 2017-11-10 | Disposition: A | Discharge status: Home / Self Care | Payer: No Typology Code available for payment source | Attending: Family Medicine | Admitting: Family Medicine

## 2017-11-10 ENCOUNTER — Encounter (HOSPITAL_COMMUNITY): Payer: Self-pay | Admitting: Emergency Medicine

## 2017-11-10 DIAGNOSIS — K589 Irritable bowel syndrome without diarrhea: Secondary | ICD-10-CM | POA: Diagnosis not present

## 2017-11-10 DIAGNOSIS — K219 Gastro-esophageal reflux disease without esophagitis: Secondary | ICD-10-CM | POA: Insufficient documentation

## 2017-11-10 DIAGNOSIS — R0602 Shortness of breath: Secondary | ICD-10-CM | POA: Insufficient documentation

## 2017-11-10 DIAGNOSIS — R350 Frequency of micturition: Secondary | ICD-10-CM | POA: Diagnosis not present

## 2017-11-10 DIAGNOSIS — N390 Urinary tract infection, site not specified: Secondary | ICD-10-CM | POA: Diagnosis present

## 2017-11-10 LAB — POCT URINALYSIS DIP (DEVICE)
Bilirubin Urine: NEGATIVE
Glucose, UA: NEGATIVE mg/dL
HGB URINE DIPSTICK: NEGATIVE
KETONES UR: NEGATIVE mg/dL
Leukocytes, UA: NEGATIVE
Nitrite: NEGATIVE
PH: 6 (ref 5.0–8.0)
PROTEIN: NEGATIVE mg/dL
Specific Gravity, Urine: >=1.03 (ref 1.005–1.030)
Urobilinogen, UA: 1 mg/dL (ref 0.0–1.0)

## 2017-11-10 LAB — POCT PREGNANCY, URINE: PREG TEST UR: NEGATIVE

## 2017-11-10 MED ORDER — CIPROFLOXACIN HCL 500 MG PO TABS
ORAL_TABLET | ORAL | Status: AC
  Filled 2017-11-10: qty 1

## 2017-11-10 MED ORDER — CIPROFLOXACIN HCL 500 MG PO TABS
500.0000 mg | ORAL_TABLET | Freq: Once | ORAL | Status: AC
  Administered 2017-11-10: 500 mg via ORAL

## 2017-11-10 MED ORDER — CIPROFLOXACIN HCL 500 MG PO TABS: 500.0000 mg | ORAL_TABLET | Freq: Two times a day (BID) | ORAL | 0 refills | Status: DC

## 2017-11-10 NOTE — ED Provider Notes (Signed)
[place]   [id number] 11/10/17 Arrival Time: 1658   SUBJECTIVE:  Sharon Herring is a 42 y.o. female who presents to the urgent care with complaint of urinary tract infection symptoms:    Past Medical History:  Diagnosis Date  . Anemia   . Dizziness   . Fibroid uterus   . GERD (gastroesophageal reflux disease)   . IBS (irritable bowel syndrome)   . Migraine   . Polyp of stomach 01-06-16   Stomach polyp, pyloric   Family History  Problem Relation Age of Onset  . Hypertension Mother   . Hypertension Father    Social History   Socioeconomic History  . Marital status: Single    Spouse name: Not on file  . Number of children: 0  . Years of education: Not on file  . Highest education level: Not on file  Occupational History  . Not on file  Social Needs  . Financial resource strain: Not hard at all  . Food insecurity:    Worry: Never true    Inability: Never true  . Transportation needs:    Medical: No    Non-medical: No  Tobacco Use  . Smoking status: Never Smoker  . Smokeless tobacco: Never Used  Substance and Sexual Activity  . Alcohol use: No    Alcohol/week: 0.0 oz  . Drug use: No  . Sexual activity: Yes    Birth control/protection: Condom  Lifestyle  . Physical activity:    Days per week: 7 days    Minutes per session: 30 min  . Stress: Rather much  Relationships  . Social connections:    Talks on phone: More than three times a week    Gets together: More than three times a week    Attends religious service: More than 4 times per year    Active member of club or organization: Yes    Attends meetings of clubs or organizations: More than 4 times per year    Relationship status: Never married  . Intimate partner violence:    Fear of current or ex partner: No    Emotionally abused: No    Physically abused: No    Forced sexual activity: No  Other Topics Concern  . Not on file  Social History Narrative   Lives alone, works for [employer] at  the [employer]    No outpatient medications have been marked as taking for the 11/10/17 encounter [facility] Encounter).   Allergies  Allergen Reactions  . Aspirin Nausea And Vomiting  . Oxycodone-Acetaminophen Rash and Shortness Of Breath  . Tape Other (See Comments)    Burning feeling. Has left mark on skin.  Melynda Keller [Lorcaserin Hcl] Other (See Comments)    headache  . Contrave [Naltrexone-Bupropion Hcl Er]   . Dicyclomine Nausea And Vomiting  . Norethindrone Rash      ROS: As per HPI, remainder of ROS negative.   OBJECTIVE:   Vitals:   11/10/17 1718  BP: 131/88  Pulse: 84  Resp: 18  Temp: (!) 97 F (36.1 C)  TempSrc: Oral  SpO2: 98%     General appearance: alert; no distress Eyes: PERRL; EOMI; conjunctiva normal HENT: normocephalic; atraumatic;  Neck: supple Back: no CVA tenderness Extremities: no cyanosis or edema; symmetrical with no gross deformities Skin: warm and dry Neurologic: normal gait; grossly normal Psychological: alert and cooperative; normal mood and affect      Labs:  Results for orders placed or performed during the hospital encounter of  11/10/17  POCT urinalysis dip (device)  Result Value Ref Range   Glucose, UA NEGATIVE NEGATIVE mg/dL   Bilirubin Urine NEGATIVE NEGATIVE   Ketones, ur NEGATIVE NEGATIVE mg/dL   Specific Gravity, Urine >=1.030 1.005 - 1.030   Hgb urine dipstick NEGATIVE NEGATIVE   pH 6.0 5.0 - 8.0   Protein, ur NEGATIVE NEGATIVE mg/dL   Urobilinogen, UA 1.0 0.0 - 1.0 mg/dL   Nitrite NEGATIVE NEGATIVE   Leukocytes, UA NEGATIVE NEGATIVE  Pregnancy, urine POC  Result Value Ref Range   Preg Test, Ur NEGATIVE NEGATIVE    Labs Reviewed  URINE CULTURE  POCT URINALYSIS DIP (DEVICE)  POCT PREGNANCY, URINE    Recheck of test strip shows some leuk esterase that the machine is not picking up    ASSESSMENT & PLAN:  1. Frequency of micturition     Meds ordered this encounter  Medications  . ciprofloxacin (CIPRO)  tablet 500 mg  . ciprofloxacin (CIPRO) 500 MG tablet    Sig: Take 1 tablet (500 mg total) by mouth 2 (two) times daily.    Dispense:  10 tablet    Refill:  0    Reviewed expectations re: course of current medical issues. Questions answered. Outlined signs and symptoms indicating need for more acute intervention. Patient verbalized understanding. After Visit Summary given.       Robyn Haber, MD 11/10/17 1735

## 2017-11-10 NOTE — ED Triage Notes (Signed)
Pt here for possible UTI symptoms

## 2017-11-10 NOTE — Discharge Instructions (Addendum)
We are running a culture.

## 2017-11-11 LAB — URINE CULTURE

## 2017-11-15 ENCOUNTER — Encounter (HOSPITAL_COMMUNITY): Payer: Self-pay | Admitting: Emergency Medicine

## 2017-11-15 ENCOUNTER — Ambulatory Visit (HOSPITAL_COMMUNITY)
Admission: EM | Admit: 2017-11-15 | Discharge: 2017-11-15 | Disposition: A | Discharge status: Home / Self Care | Payer: No Typology Code available for payment source | Attending: Family Medicine | Admitting: Family Medicine

## 2017-11-15 ENCOUNTER — Other Ambulatory Visit: Payer: Self-pay

## 2017-11-15 DIAGNOSIS — S161XXA Strain of muscle, fascia and tendon at neck level, initial encounter: Secondary | ICD-10-CM | POA: Diagnosis not present

## 2017-11-15 MED ORDER — CYCLOBENZAPRINE HCL 10 MG PO TABS: 10.0000 mg | ORAL_TABLET | Freq: Three times a day (TID) | ORAL | 0 refills | Status: DC
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
## 2017-11-30 ENCOUNTER — Encounter: Payer: Self-pay | Admitting: Obstetrics and Gynecology
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
## 2017-12-14 ENCOUNTER — Ambulatory Visit (INDEPENDENT_AMBULATORY_CARE_PROVIDER_SITE_OTHER): Payer: No Typology Code available for payment source | Admitting: Family Medicine

## 2017-12-14 ENCOUNTER — Encounter: Payer: Self-pay | Admitting: Family Medicine

## 2017-12-14 VITALS — BP 110/72 | HR 89 | Temp 98.6°F | Resp 16 | Ht 65.0 in | Wt 177.8 lb

## 2017-12-14 DIAGNOSIS — Z01419 Encounter for gynecological examination (general) (routine) without abnormal findings: Secondary | ICD-10-CM | POA: Diagnosis not present

## 2017-12-14 DIAGNOSIS — Z1231 Encounter for screening mammogram for malignant neoplasm of breast: Secondary | ICD-10-CM

## 2017-12-14 LAB — LIPID PANEL
Cholesterol: 170 mg/dL (ref <200)
HDL: 41 mg/dL — ABNORMAL LOW (ref >50)
LDL Cholesterol (Calc): 114 mg/dL (calc) — ABNORMAL HIGH
NON-HDL CHOLESTEROL (CALC): 129 mg/dL (ref <130)
TRIGLYCERIDES: 63 mg/dL (ref <150)
Total CHOL/HDL Ratio: 4.1 (calc) (ref <5.0)

## 2017-12-14 LAB — COMPLETE METABOLIC PANEL WITH GFR
AG Ratio: 1.3 (calc) (ref 1.0–2.5)
ALKALINE PHOSPHATASE (APISO): 67 U/L (ref 33–115)
ALT: 12 U/L (ref 6–29)
AST: 13 U/L (ref 10–30)
Albumin: 3.9 g/dL (ref 3.6–5.1)
BUN: 12 mg/dL (ref 7–25)
CO2: 28 mmol/L (ref 20–32)
CREATININE: 0.66 mg/dL (ref 0.50–1.10)
Calcium: 9 mg/dL (ref 8.6–10.2)
Chloride: 104 mmol/L (ref 98–110)
GFR, Est African American: 128 mL/min/{1.73_m2} (ref >=60)
GFR, Est Non African American: 110 mL/min/{1.73_m2} (ref >=60)
GLOBULIN: 3.1 g/dL (ref 1.9–3.7)
GLUCOSE: 106 mg/dL — AB (ref 65–99)
Potassium: 4.7 mmol/L (ref 3.5–5.3)
SODIUM: 137 mmol/L (ref 135–146)
Total Bilirubin: 0.2 mg/dL (ref 0.2–1.2)
Total Protein: 7 g/dL (ref 6.1–8.1)

## 2017-12-14 LAB — TSH: TSH: 4.77 mIU/L — ABNORMAL HIGH

## 2017-12-14 LAB — VITAMIN B12: VITAMIN B 12: 434 pg/mL (ref 200–1100)

## 2017-12-14 NOTE — Patient Instructions (Signed)
Preventive Care 40-64 Years, Female Preventive care refers to lifestyle choices and visits with your health care provider that can promote health and wellness. What does preventive care include?  A yearly physical exam. This is also called an annual well check.  Dental exams once or twice a year.  Routine eye exams. Ask your health care provider how often you should have your eyes checked.  Personal lifestyle choices, including: ? Daily care of your teeth and gums. ? Regular physical activity. ? Eating a healthy diet. ? Avoiding tobacco and drug use. ? Limiting alcohol use. ? Practicing safe sex. ? Taking low-dose aspirin daily starting at age 58. ? Taking vitamin and mineral supplements as recommended by your health care provider. What happens during an annual well check? The services and screenings done by your health care provider during your annual well check will depend on your age, overall health, lifestyle risk factors, and family history of disease. Counseling Your health care provider may ask you questions about your:  Alcohol use.  Tobacco use.  Drug use.  Emotional well-being.  Home and relationship well-being.  Sexual activity.  Eating habits.  Work and work Statistician.  Method of birth control.  Menstrual cycle.  Pregnancy history.  Screening You may have the following tests or measurements:  Height, weight, and BMI.  Blood pressure.  Lipid and cholesterol levels. These may be checked every 5 years, or more frequently if you are over 81 years old.  Skin check.  Lung cancer screening. You may have this screening every year starting at age 78 if you have a 30-pack-year history of smoking and currently smoke or have quit within the past 15 years.  Fecal occult blood test (FOBT) of the stool. You may have this test every year starting at age 65.  Flexible sigmoidoscopy or colonoscopy. You may have a sigmoidoscopy every 5 years or a colonoscopy  every 10 years starting at age 30.  Hepatitis C blood test.  Hepatitis B blood test.  Sexually transmitted disease (STD) testing.  Diabetes screening. This is done by checking your blood sugar (glucose) after you have not eaten for a while (fasting). You may have this done every 1-3 years.  Mammogram. This may be done every 1-2 years. Talk to your health care provider about when you should start having regular mammograms. This may depend on whether you have a family history of breast cancer.  BRCA-related cancer screening. This may be done if you have a family history of breast, ovarian, tubal, or peritoneal cancers.  Pelvic exam and Pap test. This may be done every 3 years starting at age 80. Starting at age 36, this may be done every 5 years if you have a Pap test in combination with an HPV test.  Bone density scan. This is done to screen for osteoporosis. You may have this scan if you are at high risk for osteoporosis.  Discuss your test results, treatment options, and if necessary, the need for more tests with your health care provider. Vaccines Your health care provider may recommend certain vaccines, such as:  Influenza vaccine. This is recommended every year.  Tetanus, diphtheria, and acellular pertussis (Tdap, Td) vaccine. You may need a Td booster every 10 years.  Varicella vaccine. You may need this if you have not been vaccinated.  Zoster vaccine. You may need this after age 5.  Measles, mumps, and rubella (MMR) vaccine. You may need at least one dose of MMR if you were born in  1956 or later. You may also need a second dose.  Pneumococcal 13-valent conjugate (PCV13) vaccine. You may need this if you have certain conditions and were not previously vaccinated.  Pneumococcal polysaccharide (PPSV23) vaccine. You may need one or two doses if you smoke cigarettes or if you have certain conditions.  Meningococcal vaccine. You may need this if you have certain  conditions.  Hepatitis A vaccine. You may need this if you have certain conditions or if you travel or work in places where you may be exposed to hepatitis A.  Hepatitis B vaccine. You may need this if you have certain conditions or if you travel or work in places where you may be exposed to hepatitis B.  Haemophilus influenzae type b (Hib) vaccine. You may need this if you have certain conditions.  Talk to your health care provider about which screenings and vaccines you need and how often you need them. This information is not intended to replace advice given to you by your health care provider. Make sure you discuss any questions you have with your health care provider. Document Released: 09/04/2015 Document Revised: 04/27/2016 Document Reviewed: 06/09/2015 Elsevier Interactive Patient Education  2018 Elsevier Inc.  

## 2017-12-14 NOTE — Progress Notes (Signed)
Name: KHALILAH HOKE   MRN: [id number]    DOB: Sep 23, 1975   Date:12/14/2017       Progress Note  Subjective  Chief Complaint  Chief Complaint  Patient presents with  . Annual Exam    HPI   Patient presents for annual CPE   Diet: since started on Qysmia she has lost 12 lbs, she is eating breakfast - oatmeal and boiled and one glass of juice, advised to switch juice for a fruit, smaller portions, eating a lot of seafood  Exercise: walking on treadmill   USPSTF grade A and B recommendations  Depression:  Depression screen [facility] 2/9 12/14/2017 08/05/2017 04/17/2017 04/07/2017 03/28/2016  Decreased Interest 0 0 0 0 0  Down, Depressed, Hopeless 0 1 0 0 0  PHQ - 2 Score 0 1 0 0 0  Altered sleeping - 2 - - -  Tired, decreased energy - 1 - - -  Change in appetite - 1 - - -  Feeling bad or failure about yourself  - 0 - - -  Trouble concentrating - 1 - - -  Moving slowly or fidgety/restless - 0 - - -  Suicidal thoughts - 0 - - -  PHQ-9 Score - 6 - - -  Difficult doing work/chores - Not difficult at all - - -   Hypertension: BP Readings from Last 3 Encounters:  12/14/17 110/72  11/15/17 129/84  11/10/17 131/88   Obesity: Wt Readings from Last 3 Encounters:  12/14/17 177 lb 12.8 oz (80.6 kg)  11/01/17 188 lb 14.4 oz (85.7 kg)  09/22/17 196 lb (88.9 kg)   BMI Readings from Last 3 Encounters:  12/14/17 29.59 kg/m  11/01/17 31.43 kg/m  09/22/17 32.62 kg/m    Intimate partner violence: negative screen  Sexual History/Pain during Intercourse: no pain, no vagina discharge, cycles are regular and painful - unchanged  Menstrual History/LMP/Abnormal Bleeding: no abnormal bleeding, LMP was 11/24/2017 Incontinence Symptoms: none  Advanced Care Planning: A voluntary discussion about advance care planning including the explanation and discussion of advance directives.  Discussed health care proxy and Living will, and the patient was able to identify a health care proxy as mother .  Patient  does have a living will at present time. If patient does have living will, I have requested they bring this to the clinic to be scanned in to their chart.  Breast cancer:  Due for first one BRCA gene screening: not a candidate Cervical cancer screening: due for repeat pap, but will go see Dr. Marcelline Mates    Lipids:  Lab Results  Component Value Date   CHOL 177 07/21/2016   CHOL 163 06/08/2015   Lab Results  Component Value Date   HDL 39 (L) 07/21/2016   HDL 36 (L) 06/08/2015   Lab Results  Component Value Date   LDLCALC 128 (H) 07/21/2016   LDLCALC 108 (H) 06/08/2015   Lab Results  Component Value Date   TRIG 52 07/21/2016   TRIG 94 06/08/2015   Lab Results  Component Value Date   CHOLHDL 4.5 07/21/2016   CHOLHDL 4.5 06/08/2015   No results found for: LDLDIRECT  Glucose:  Glucose, Bld  Date Value Ref Range Status  04/11/2017 127 (H) 65 - 99 mg/dL Final  01/26/2017 93 65 - 99 mg/dL Final  07/21/2016 108 (H) 65 - 99 mg/dL Final     Patient Active Problem List   Diagnosis Date Noted  . Swelling of limb 01/27/2017  . B12 deficiency 03/28/2016  .  Vitamin D deficiency 03/28/2016  . Hyperglycemia 03/28/2016  . Multiple gastric polyps 01/08/2016  . Menorrhagia 11/13/2015  . Chronic constipation 09/23/2015  . Obesity, Class I, BMI 30.0-34.9 (see actual BMI) 06/02/2015  . Iron deficiency anemia 03/14/2014  . History of uterine fibroid 03/14/2014    Past Surgical History:  Procedure Laterality Date  . APPENDECTOMY  2012  . ESOPHAGOGASTRODUODENOSCOPY (EGD) WITH PROPOFOL N/A 01/06/2016   Procedure: ESOPHAGOGASTRODUODENOSCOPY (EGD) WITH PROPOFOL;  Surgeon: Robert Bellow, MD;  Location: [facility] ENDOSCOPY;  Service: Endoscopy;  Laterality: N/A;  . ROBOTIC ASSISTED LAPAROSCOPIC CHOLECYSTECTOMY N/A 03/03/2016   Procedure: ROBOTIC ASSISTED LAPAROSCOPIC CHOLECYSTECTOMY ;  Surgeon: Clayburn Pert, MD;  Location: [facility] ORS;  Service: General;  Laterality: N/A;  . UTERINE FIBROID  SURGERY  2012    Family History  Problem Relation Age of Onset  . Hypertension Mother   . Hypertension Father     Social History   Socioeconomic History  . Marital status: Single    Spouse name: Not on file  . Number of children: 0  . Years of education: Not on file  . Highest education level: Bachelor's degree (e.g., BA, AB, BS)  Occupational History  . Occupation: CMA  Social Needs  . Financial resource strain: Not hard at all  . Food insecurity:    Worry: Never true    Inability: Never true  . Transportation needs:    Medical: No    Non-medical: No  Tobacco Use  . Smoking status: Never Smoker  . Smokeless tobacco: Never Used  Substance and Sexual Activity  . Alcohol use: No    Alcohol/week: 0.0 oz  . Drug use: No  . Sexual activity: Yes    Birth control/protection: Condom  Lifestyle  . Physical activity:    Days per week: 7 days    Minutes per session: 30 min  . Stress: Not at all  Relationships  . Social connections:    Talks on phone: More than three times a week    Gets together: More than three times a week    Attends religious service: More than 4 times per year    Active member of club or organization: Yes    Attends meetings of clubs or organizations: More than 4 times per year    Relationship status: Never married  . Intimate partner violence:    Fear of current or ex partner: No    Emotionally abused: No    Physically abused: No    Forced sexual activity: No  Other Topics Concern  . Not on file  Social History Narrative   Lives alone, works for [employer] at the [employer] , but switching to [employer]      Current Outpatient Medications:  .  BENZEPRO FOAMING CLOTHS 6 % MISC, Apply 1 each topically every other day., Disp: , Rfl:  .  cholecalciferol (VITAMIN D) 1000 units tablet, Take 1 tablet (1,000 Units total) by mouth daily., Disp: 30 tablet, Rfl: 0 .  fluticasone (FLONASE) 50 MCG/ACT nasal spray, Place 2 sprays into both nostrils daily., Disp: 16 g,  Rfl: 6 .  furosemide (LASIX) 20 MG tablet, Take 1 tablet (20 mg total) by mouth daily., Disp: 30 tablet, Rfl: 1 .  ibuprofen (ADVIL,MOTRIN) 800 MG tablet, Take 1 tablet (800 mg total) by mouth every 8 (eight) hours as needed., Disp: 60 tablet, Rfl: 3 .  iron polysaccharides (NIFEREX) 150 MG capsule, Take 150 mg by mouth every other day., Disp: , Rfl:  .  linaclotide (LINZESS) 145 MCG CAPS capsule, Take 1 capsule (145 mcg total) by mouth daily before breakfast., Disp: 30 capsule, Rfl: 2 .  Multiple Vitamins-Minerals (MULTIVITAMIN ADULT) TABS, Take 1 tablet by mouth daily., Disp: 30 tablet, Rfl: 0 .  Phentermine-Topiramate (QSYMIA) 7.5-46 MG CP24, Take 1 tablet by mouth daily., Disp: 30 capsule, Rfl: 0 .  SULFACLEANSE 8/4 8-4 % SUSP, Apply 1 drop topically daily., Disp: , Rfl:  .  tranexamic acid (LYSTEDA) 650 MG TABS tablet, Take 2 tablets (1,300 mg total) by mouth 3 (three) times daily. Take during menses for a maximum of five days, Disp: 30 tablet, Rfl: 11  Allergies  Allergen Reactions  . Aspirin Nausea And Vomiting  . Oxycodone-Acetaminophen Rash and Shortness Of Breath  . Tape Other (See Comments)    Burning feeling. Has left mark on skin.  Melynda Keller [Lorcaserin Hcl] Other (See Comments)    headache  . Contrave [Naltrexone-Bupropion Hcl Er]   . Dicyclomine Nausea And Vomiting  . Norethindrone Rash     ROS   Constitutional: Negative for fever, positive for  weight change. - doing well on Qsymia   Respiratory: Negative for cough and shortness of breath.   Cardiovascular: Negative for chest pain or palpitations.  Gastrointestinal: Negative for abdominal pain, no bowel changes.  Musculoskeletal: Negative for gait problem or joint swelling.  Skin: Negative for rash.  Neurological: Negative for dizziness or headache.  No other specific complaints in a complete review of systems (except as listed in HPI above).   Objective  Vitals:   12/14/17 0822  BP: 110/72  Pulse: 89  Resp:  16  Temp: 98.6 F (37 C)  TempSrc: Oral  SpO2: 97%  Weight: 177 lb 12.8 oz (80.6 kg)  Height: '5\' 5"'  (1.651 m)    Body mass index is 29.59 kg/m.  Physical Exam  Constitutional: Patient appears well-developed and well-nourished. No distress.  HENT: Head: Normocephalic and atraumatic. Ears: B TMs ok, no erythema or effusion; Nose: Nose normal. Mouth/Throat: Oropharynx is clear and moist. No oropharyngeal exudate.  Eyes: Conjunctivae and EOM are normal. Pupils are equal, round, and reactive to light. No scleral icterus.  Neck: Normal range of motion. Neck supple. No JVD present. No thyromegaly present.  Cardiovascular: Normal rate, regular rhythm and normal heart sounds.  No murmur heard. No BLE edema. Pulmonary/Chest: Effort normal and breath sounds normal. No respiratory distress. Abdominal: Soft. Bowel sounds are normal, no distension. There is no tenderness. no masses Breast: no lumps or masses, no nipple discharge or rashes FEMALE GENITALIA:  Not done, going to gyn  RECTAL: not done Musculoskeletal: Normal range of motion, no joint effusions. No gross deformities Neurological: he is alert and oriented to person, place, and time. No cranial nerve deficit. Coordination, balance, strength, speech and gait are normal.  Skin: Skin is warm and dry. No rash noted. No erythema.  Psychiatric: Patient has a normal mood and affect. behavior is normal. Judgment and thought content normal.   Recent Results (from the past 2160 hour(s))  POCT urinalysis dip (device)     Status: None   Collection Time: 11/10/17  5:20 PM  Result Value Ref Range   Glucose, UA NEGATIVE NEGATIVE mg/dL   Bilirubin Urine NEGATIVE NEGATIVE   Ketones, ur NEGATIVE NEGATIVE mg/dL   Specific Gravity, Urine >=1.030 1.005 - 1.030   Hgb urine dipstick NEGATIVE NEGATIVE   pH 6.0 5.0 - 8.0   Protein, ur NEGATIVE NEGATIVE mg/dL   Urobilinogen, UA 1.0 0.0 -  1.0 mg/dL   Nitrite NEGATIVE NEGATIVE   Leukocytes, UA NEGATIVE  NEGATIVE    Comment: Biochemical Testing Only. Please order routine urinalysis from main lab if confirmatory testing is needed.  Pregnancy, urine POC     Status: None   Collection Time: 11/10/17  5:27 PM  Result Value Ref Range   Preg Test, Ur NEGATIVE NEGATIVE    Comment:        THE SENSITIVITY OF THIS METHODOLOGY IS >24 mIU/mL   Urine culture     Status: Abnormal   Collection Time: 11/10/17  5:34 PM  Result Value Ref Range   Specimen Description URINE, RANDOM    Special Requests      NONE Performed at [facility], [address] [place], [place] [address]    Culture MULTIPLE SPECIES PRESENT, SUGGEST RECOLLECTION (A)    Report Status 11/11/2017 FINAL       PHQ2/9: Depression screen [facility] 2/9 12/14/2017 08/05/2017 04/17/2017 04/07/2017 03/28/2016  Decreased Interest 0 0 0 0 0  Down, Depressed, Hopeless 0 1 0 0 0  PHQ - 2 Score 0 1 0 0 0  Altered sleeping - 2 - - -  Tired, decreased energy - 1 - - -  Change in appetite - 1 - - -  Feeling bad or failure about yourself  - 0 - - -  Trouble concentrating - 1 - - -  Moving slowly or fidgety/restless - 0 - - -  Suicidal thoughts - 0 - - -  PHQ-9 Score - 6 - - -  Difficult doing work/chores - Not difficult at all - - -     Fall Risk: Fall Risk  12/14/2017 11/01/2017 08/05/2017 04/17/2017 04/07/2017  Falls in the past year? No No No No No     Functional Status Survey: Is the patient deaf or have difficulty hearing?: No Does the patient have difficulty seeing, even when wearing glasses/contacts?: No Does the patient have difficulty concentrating, remembering, or making decisions?: No Does the patient have difficulty walking or climbing stairs?: No Does the patient have difficulty dressing or bathing?: No Does the patient have difficulty doing errands alone such as visiting a doctor's office or shopping?: No   Assessment & Plan  1. Well woman exam  Discussed importance of 150 minutes of physical activity weekly, eat two  servings of fish weekly, eat one serving of tree nuts ( cashews, pistachios, pecans, almonds.Marland Kitchen) every other day, eat 6 servings of fruit/vegetables daily and drink plenty of water and avoid sweet beverages.   2. Breast cancer screening by mammogram  - MM DIGITAL SCREENING BILATERAL; Future

## 2017-12-15 LAB — RPR: RPR: NONREACTIVE

## 2017-12-15 LAB — HEPATITIS PANEL, ACUTE
HEP B C IGM: NONREACTIVE
HEP B S AG: NONREACTIVE
Hep A IgM: NONREACTIVE
Hepatitis C Ab: NONREACTIVE
SIGNAL TO CUT-OFF: 0.18 (ref <1.00)

## 2017-12-15 LAB — HEMOGLOBIN A1C
HEMOGLOBIN A1C: 6.3 %{Hb} — AB (ref <5.7)
Mean Plasma Glucose: 134 (calc)
eAG (mmol/L): 7.4 (calc)

## 2017-12-15 LAB — VITAMIN D 25 HYDROXY (VIT D DEFICIENCY, FRACTURES): Vit D, 25-Hydroxy: 20 ng/mL — ABNORMAL LOW (ref 30–100)

## 2017-12-15 LAB — HIV ANTIBODY (ROUTINE TESTING W REFLEX): HIV 1&2 Ab, 4th Generation: NONREACTIVE

## 2017-12-18 ENCOUNTER — Other Ambulatory Visit: Payer: Self-pay | Admitting: Family Medicine

## 2017-12-18 DIAGNOSIS — G4733 Obstructive sleep apnea (adult) (pediatric): Secondary | ICD-10-CM

## 2017-12-19 DIAGNOSIS — G4733 Obstructive sleep apnea (adult) (pediatric): Secondary | ICD-10-CM | POA: Insufficient documentation

## 2018-01-01 ENCOUNTER — Ambulatory Visit
Admission: RE | Admit: 2018-01-01 | Discharge: 2018-01-01 | Disposition: A | Discharge status: Home / Self Care | Payer: No Typology Code available for payment source | Source: Ambulatory Visit | Attending: Family Medicine | Admitting: Family Medicine

## 2018-01-01 DIAGNOSIS — Z1231 Encounter for screening mammogram for malignant neoplasm of breast: Secondary | ICD-10-CM | POA: Diagnosis not present

## 2018-01-02 ENCOUNTER — Other Ambulatory Visit: Payer: Self-pay | Admitting: Family Medicine

## 2018-01-02 DIAGNOSIS — R590 Localized enlarged lymph nodes: Secondary | ICD-10-CM

## 2018-01-02 DIAGNOSIS — R928 Other abnormal and inconclusive findings on diagnostic imaging of breast: Secondary | ICD-10-CM

## 2018-01-11 ENCOUNTER — Ambulatory Visit
Admission: RE | Admit: 2018-01-11 | Discharge: 2018-01-11 | Disposition: A | Discharge status: Home / Self Care | Payer: No Typology Code available for payment source | Source: Ambulatory Visit | Attending: Family Medicine | Admitting: Family Medicine

## 2018-01-11 ENCOUNTER — Other Ambulatory Visit: Payer: Self-pay | Admitting: Family Medicine

## 2018-01-11 DIAGNOSIS — R590 Localized enlarged lymph nodes: Secondary | ICD-10-CM

## 2018-01-11 DIAGNOSIS — R928 Other abnormal and inconclusive findings on diagnostic imaging of breast: Secondary | ICD-10-CM

## 2018-01-16 ENCOUNTER — Encounter: Payer: Self-pay | Admitting: General Surgery

## 2018-01-16 ENCOUNTER — Other Ambulatory Visit: Payer: Self-pay | Admitting: Family Medicine

## 2018-01-16 ENCOUNTER — Ambulatory Visit (INDEPENDENT_AMBULATORY_CARE_PROVIDER_SITE_OTHER): Payer: No Typology Code available for payment source | Admitting: General Surgery

## 2018-01-16 VITALS — BP 130/74 | HR 86 | Resp 14 | Ht 65.0 in | Wt 226.0 lb

## 2018-01-16 DIAGNOSIS — R591 Generalized enlarged lymph nodes: Secondary | ICD-10-CM

## 2018-01-16 DIAGNOSIS — R599 Enlarged lymph nodes, unspecified: Secondary | ICD-10-CM

## 2018-01-16 LAB — CBC WITH DIFFERENTIAL/PLATELET
BASOS ABS: 19 {cells}/uL (ref 0–200)
BASOS PCT: 0.3 %
EOS PCT: 1.3 %
Eosinophils Absolute: 81 cells/uL (ref 15–500)
HEMATOCRIT: 28.4 % — AB (ref 35.0–45.0)
HEMOGLOBIN: 8.8 g/dL — AB (ref 11.7–15.5)
LYMPHS ABS: 2486 {cells}/uL (ref 850–3900)
MCH: 22.4 pg — ABNORMAL LOW (ref 27.0–33.0)
MCHC: 31 g/dL — ABNORMAL LOW (ref 32.0–36.0)
MCV: 72.4 fL — ABNORMAL LOW (ref 80.0–100.0)
MPV: 9.9 fL (ref 7.5–12.5)
Monocytes Relative: 9.4 %
NEUTROS ABS: 3032 {cells}/uL (ref 1500–7800)
Neutrophils Relative %: 48.9 %
Platelets: 524 10*3/uL — ABNORMAL HIGH (ref 140–400)
RBC: 3.92 10*6/uL (ref 3.80–5.10)
RDW: 15.5 % — ABNORMAL HIGH (ref 11.0–15.0)
Total Lymphocyte: 40.1 %
WBC mixed population: 583 cells/uL (ref 200–950)
WBC: 6.2 10*3/uL (ref 3.8–10.8)

## 2018-01-16 NOTE — Patient Instructions (Signed)
The patient is aware to call back for any questions or concerns.  

## 2018-01-16 NOTE — Progress Notes (Signed)
Patient ID: Sharon Herring, female   DOB: Nov 09, 1975, 42 y.o.   MRN: [id number]  Chief Complaint  Patient presents with  . Breast Problem    HPI Sharon Herring is a 42 y.o. female.  who presents for a breast evaluation referred by Dr Ancil Boozer. The most recent mammogram was done on 01-02-18 and bilateral ultrasound was 01-11-18.  Patient does perform regular self breast checks and her last mammogram was 2009. She can not feel anything different in the breast.   She states during her menstrual cycle her left ankle swells but denies any further swelling. Denies any night sweats. Denies any arm trauma. She works at [employer]. She is here with her friend and former co-worker at [place].  HPI  Past Medical History:  Diagnosis Date  . Anemia   . Dizziness   . Fibroid uterus   . GERD (gastroesophageal reflux disease)   . IBS (irritable bowel syndrome)   . Migraine   . Polyp of stomach 01-06-16   Stomach polyp, pyloric    Past Surgical History:  Procedure Laterality Date  . APPENDECTOMY  2012  . ESOPHAGOGASTRODUODENOSCOPY (EGD) WITH PROPOFOL N/A 01/06/2016   Procedure: ESOPHAGOGASTRODUODENOSCOPY (EGD) WITH PROPOFOL;  Surgeon: Robert Bellow, MD;  Location: [facility] ENDOSCOPY;  Service: Endoscopy;  Laterality: N/A;  . ROBOTIC ASSISTED LAPAROSCOPIC CHOLECYSTECTOMY N/A 03/03/2016   Procedure: ROBOTIC ASSISTED LAPAROSCOPIC CHOLECYSTECTOMY ;  Surgeon: Clayburn Pert, MD;  Location: [facility] ORS;  Service: General;  Laterality: N/A;  . UTERINE FIBROID SURGERY  2012    Family History  Problem Relation Age of Onset  . Hypertension Mother   . Hypertension Father   . Pancreatic cancer Father 74  . Breast cancer Neg Hx   . Colon cancer Neg Hx     Social History Social History   Tobacco Use  . Smoking status: Never Smoker  . Smokeless tobacco: Never Used  Substance Use Topics  . Alcohol use: No    Alcohol/week: 0.0 oz  . Drug use: No    Allergies  Allergen Reactions  .  Aspirin Nausea And Vomiting  . Oxycodone-Acetaminophen Rash and Shortness Of Breath  . Tape Other (See Comments)    Burning feeling. Has left mark on skin.  Melynda Keller [Lorcaserin Hcl] Other (See Comments)    headache  . Contrave [Naltrexone-Bupropion Hcl Er]   . Dicyclomine Nausea And Vomiting  . Norethindrone Rash    Current Outpatient Medications  Medication Sig Dispense Refill  . BENZEPRO FOAMING CLOTHS 6 % MISC Apply 1 each topically every other day.    . cholecalciferol (VITAMIN D) 1000 units tablet Take 1 tablet (1,000 Units total) by mouth daily. 30 tablet 0  . fluticasone (FLONASE) 50 MCG/ACT nasal spray Place 2 sprays into both nostrils daily. 16 g 6  . furosemide (LASIX) 20 MG tablet Take 1 tablet (20 mg total) by mouth daily. 30 tablet 1  . ibuprofen (ADVIL,MOTRIN) 800 MG tablet Take 1 tablet (800 mg total) by mouth every 8 (eight) hours as needed. 60 tablet 3  . iron polysaccharides (NIFEREX) 150 MG capsule Take 150 mg by mouth every other day.    . linaclotide (LINZESS) 145 MCG CAPS capsule Take 1 capsule (145 mcg total) by mouth daily before breakfast. 30 capsule 2  . Multiple Vitamins-Minerals (MULTIVITAMIN ADULT) TABS Take 1 tablet by mouth daily. 30 tablet 0  . Phentermine-Topiramate (QSYMIA) 7.5-46 MG CP24 Take 1 tablet by mouth daily. 30 capsule 0  . SULFACLEANSE 8/4  8-4 % SUSP Apply 1 drop topically daily.    . tranexamic acid (LYSTEDA) 650 MG TABS tablet Take 2 tablets (1,300 mg total) by mouth 3 (three) times daily. Take during menses for a maximum of five days 30 tablet 11   No current facility-administered medications for this visit.     Review of Systems Review of Systems  Constitutional: Negative.  Negative for activity change, chills, diaphoresis, fatigue, fever and unexpected weight change.  HENT: Negative.   Respiratory: Negative.   Cardiovascular: Negative.   Gastrointestinal: Negative.   Endocrine: Negative.   Genitourinary: Negative.    Musculoskeletal: Negative.   Allergic/Immunologic: Negative.   Neurological: Negative.   Hematological: Negative.   Psychiatric/Behavioral: Negative.     Blood pressure 130/74, pulse 86, resp. rate 14, height 5\' 5"  (1.651 m), weight 226 lb (102.5 kg), last menstrual period 12/23/2017.  Physical Exam Physical Exam  Constitutional: She is oriented to person, place, and time. She appears well-developed and well-nourished.  HENT:  Mouth/Throat: Oropharynx is clear and moist.  Eyes: Conjunctivae are normal. No scleral icterus.  Neck: Neck supple.  Cardiovascular: Normal rate, regular rhythm and normal heart sounds.  Pulmonary/Chest: Effort normal and breath sounds normal. Right breast exhibits no inverted nipple, no mass, no nipple discharge, no skin change and no tenderness. Left breast exhibits no inverted nipple, no mass, no nipple discharge, no skin change and no tenderness.  Breasts are soft, supple without dominant mass or thickening.  No abnormality of the nipple-areolar complex.  Abdominal: There is no splenomegaly or hepatomegaly.    Lymphadenopathy:    She has no cervical adenopathy.    She has no axillary adenopathy.       Right: No inguinal and no supraclavicular adenopathy present.       Left: No inguinal and no supraclavicular adenopathy present.  Neurological: She is alert and oriented to person, place, and time.  Skin: Skin is warm and dry.  Psychiatric: Her behavior is normal.    Data Reviewed Bilateral breast mammograms of Jan 02, 2018 and bilateral axillary ultrasound of Jan 11, 2018 reviewed. No abnormality of the breast parenchyma.  Bilateral, prominent axillary lymph nodes.  Cortical thickening in the left lower axilla reported at 5 mm.  Lymph nodes reportedly larger from 2009 exam.  Assessment    Incidental finding of prominent bilateral axillary lymph nodes.    Plan    My suspicion for a breast primary is very small, and the possibility of lymphoma is  low as well.  A CBC will be obtained in regards to the latter.  Options for management were reviewed: 1) he proceed directly to lymph node biopsy (open versus core guided would be based on clinical suspicion, lymphoma best assessed with formal node biopsy rather than core); versus observation.  With the lack of any "B" symptoms, I think observation is a very reasonable option.  If over the interval of time the patient changes her mind and wants to proceed to biopsy this is not a problem.  If the lymph nodes increase in size over time biopsy would be prudent.    Recheck in 3 months      HPI, Physical Exam, Assessment and Plan have been scribed under the direction and in the presence of Robert Bellow, MD. Karie Fetch, RN  I have completed the exam and reviewed the above documentation for accuracy and completeness.  I agree with the above.  Haematologist has been used and any errors in dictation or  transcription are unintentional.  Hervey Ard, M.D., F.A.C.S.  Forest Gleason Jiles Goya 01/16/2018, 11:16 AM

## 2018-01-20 ENCOUNTER — Telehealth: Payer: No Typology Code available for payment source | Admitting: Family

## 2018-01-20 DIAGNOSIS — K0889 Other specified disorders of teeth and supporting structures: Secondary | ICD-10-CM

## 2018-01-20 DIAGNOSIS — R110 Nausea: Secondary | ICD-10-CM

## 2018-01-20 NOTE — Progress Notes (Signed)
Based on what you shared with me it looks like you have a serious condition that should be evaluated in a face to face office visit.  NOTE: If you entered your credit card information for this eVisit, you will not be charged. You may see a "hold" on your card for the $30 but that hold will drop off and you will not have a charge processed.  If you are having a true medical emergency please call 911.  If you need an urgent face to face visit, [place] has four urgent care centers for your convenience.  If you need care fast and have a high deductible or no insurance consider:   [email or web address] to reserve your spot online an avoid wait times  [facility] [address], [address] [place], [place] [address] 8 am to 8 pm Monday-Friday 10 am to 4 pm Saturday-Sunday *Across the street from [employer]  [facility]  [address] [place], [address] 8 am to 5 pm Monday-Friday * In the [facility]   The following sites will take your  insurance:  . [facility]  [phone] Get Driving Directions Find a Provider at this Location  [address] [place], [place] [address] . 10 am to 8 pm Monday-Friday . 12 pm to 8 pm Saturday-Sunday   . [facility]  [phone] Get Driving Directions Find a Provider at this Location  [address], [address] [place], [place] [address] . 8 am to 8 pm Monday-Friday . 9 am to 6 pm Saturday . 11 am to 6 pm Sunday   .  [facility]gent [facility] at [facility] [place]  [phone] Get Driving Directions  [address] [place],  [address] . 8 am to 8 pm Monday-Friday . 8 am to 4 pm Saturday-Sunday   Your e-visit answers were reviewed by a board certified advanced clinical practitioner to complete your personal care plan.  Thank you for using e-Visits.  

## 2018-01-29 ENCOUNTER — Telehealth: Payer: Self-pay | Admitting: Family Medicine

## 2018-01-29 ENCOUNTER — Other Ambulatory Visit: Payer: Self-pay | Admitting: Family Medicine

## 2018-01-29 DIAGNOSIS — G4733 Obstructive sleep apnea (adult) (pediatric): Secondary | ICD-10-CM

## 2018-01-29 MED ORDER — RESPIRATORY THERAPY SUPPLIES DEVI: 1.0000 | Freq: Every day | 0 refills | Status: DC

## 2018-01-29 NOTE — Progress Notes (Signed)
New rx has been sent for CPAP machine and supplies

## 2018-01-29 NOTE — Telephone Encounter (Signed)
Copied from [place] [phone]. Topic: Quick Communication - See Telephone Encounter >> Jan 29, 2018  1:13 PM Conception Chancy, NT wrote: CRM for notification. See Telephone encounter for: 01/29/18.  Alwyn Ren is calling from [facility] needing clarification on the rx [facility] [place]. They are unsure what they are needing. Please contact. [place], [place] [address] Phone: [phone] Fax: [phone]

## 2018-01-30 NOTE — Telephone Encounter (Signed)
[facility] pharmacy and gave clarity that patient is having this rx for CPAP & supplies filled at [facility].

## 2018-01-31 ENCOUNTER — Encounter: Payer: Self-pay | Admitting: Obstetrics and Gynecology

## 2018-01-31 ENCOUNTER — Ambulatory Visit (INDEPENDENT_AMBULATORY_CARE_PROVIDER_SITE_OTHER): Payer: No Typology Code available for payment source | Admitting: Obstetrics and Gynecology

## 2018-01-31 VITALS — BP 126/81 | HR 76 | Ht 65.0 in | Wt 226.9 lb

## 2018-01-31 DIAGNOSIS — D259 Leiomyoma of uterus, unspecified: Secondary | ICD-10-CM

## 2018-01-31 DIAGNOSIS — N920 Excessive and frequent menstruation with regular cycle: Secondary | ICD-10-CM

## 2018-01-31 DIAGNOSIS — D500 Iron deficiency anemia secondary to blood loss (chronic): Secondary | ICD-10-CM

## 2018-01-31 NOTE — Progress Notes (Signed)
Pt stated her cycle has been on for 10 days. Pt stated that her cycle was very heavy thoughtout the cycle with clots. Marland Kitchen

## 2018-01-31 NOTE — Progress Notes (Signed)
GYNECOLOGY PROGRESS NOTE  Subjective:    Patient ID: Sharon Herring, female    DOB: [birth year], 42 y.o.   MRN: [id number]  HPI  Patient is a 42 y.o. G0P0000 female who presents for complaints of an unusually long cycle.  Patient does have a history of fibroids, is currently using Lysteda for management of her heavy menstrual cycles.  Has been using this medication since September 2018.  The patient notes that the Marshell Levan has been working however her most recent cycle was prolonged and lasted 10 days with heavy bleeding and passage of clots.   The following portions of the patient's history were reviewed and updated as appropriate: allergies, current medications, past family history, past medical history, past social history, past surgical history and problem list.  Review of Systems Pertinent items noted in HPI and remainder of comprehensive ROS otherwise negative.   Objective:   Blood pressure 126/81, pulse 76, height 5\' 5"  (1.651 m), weight 226 lb 14.4 oz (102.9 kg). General appearance: alert and no distress Abdomen: soft, non-tender; bowel sounds normal; no masses,  no organomegaly Pelvic: external genitalia normal, rectovaginal septum normal.  Vagina with small amount of thin white discharge, no odor.  Cervix normal appearing, no lesions and no motion tenderness.  Uterus mobile, nontender, normal shape, slightly enlarged, 12 to 14 weeks size (no change from previous exam).  Adnexae non-palpable, nontender bilaterally.    Imaging:  ULTRASOUND REPORT  Location: [facility] Date of Service: 02/13/17   Indications:fibroids Findings:  The uterus measures 11.3 x 7.5 x 7.6 cm. Echo texture is heterogenous with evidence of focal masses. Within the uterus are multiple suspected fibroids measuring: Fibroid 1:Anterior fundal submucosal 2.8 x 3.8 x 2.8 cm Fibroid 2:Posterior Fundal submucosal 3.6 x 3.9 x 4.3 cm Fibroid 3:fundal intramural 4.4 x 3.4 x 3.7 cm The  Endometrium measures 6.3 mm.  Right Ovary measures 3 x 1.6 x 1.6 cm. It is normal in appearance. Left Ovary measures 3.2 x 2.1 x 2.4 cm. It is normal appearance. Survey of the adnexa demonstrates no adnexal masses. There is no free fluid in the cul de sac.  Impression: 1. Fibroid uterus  Recommendations: 1.Clinical correlation with the patient's History and Physical Exam    Labs:  Lab Results  Component Value Date   WBC 6.2 01/16/2018   HGB 8.8 (L) 01/16/2018   HCT 28.4 (L) 01/16/2018   MCV 72.4 (L) 01/16/2018   PLT 524 (H) 01/16/2018     Assessment:   Fibroid uterus Menorrhagia with regular cycle Anemia   Plan:   -Discussion had with patient on most recent cycle becoming more prolonged.  Has h/o fibroid uterus. Currently using Lysteda, has been working well in the past however most recent cycle became heavier and longer.  Discussed that this could potentially be a fluke, and that her next period may be normal.  I also discussed with her that she may be getting used to the [facility] and may require a different sort of treatment in the future.  Discussed option of changing medication management now or waiting 1 additional cycle to see if her menses returns back to a lighter more normal flow.  Patient desires to wait until next month to make a final decision.   -Fibroid uterus with no noticeable change in size since her prior exam and ultrasound.  Not concerned at this point that her fibroids have enlarged.  However discussed other management options with patient including hormonal therapy that could  not only help to regulate her cycles better but also may help in decreasing the rate of growth and current size of her fibroids.  Patient previously was hesitant about starting hormonal therapy, however may be willing to reconsider if the Lysteda no longer helps her cycles. -Anemia, patient currently without major symptoms.  Notes that she is due for another iron infusion within the  next week.  To follow-up in 1 month if symptoms worsen or fail to improve.   Rubie Maid, MD [facility]

## 2018-03-07 ENCOUNTER — Encounter: Payer: Self-pay | Admitting: *Deleted

## 2018-03-20 ENCOUNTER — Other Ambulatory Visit: Payer: Self-pay

## 2018-03-20 MED ORDER — TRIAMCINOLONE ACETONIDE 0.1 % EX CREA: 1.0000 "application " | TOPICAL_CREAM | Freq: Two times a day (BID) | CUTANEOUS | 0 refills | Status: DC

## 2018-03-20 NOTE — Telephone Encounter (Signed)
Refill request was sent to Dr. Krichna Sowles for approval and submission.  

## 2018-03-23 ENCOUNTER — Encounter: Payer: Self-pay | Admitting: Family Medicine

## 2018-03-23 ENCOUNTER — Ambulatory Visit (INDEPENDENT_AMBULATORY_CARE_PROVIDER_SITE_OTHER): Payer: No Typology Code available for payment source | Admitting: Family Medicine

## 2018-03-23 VITALS — BP 112/68 | HR 85 | Temp 98.0°F | Resp 16 | Ht 65.0 in | Wt 212.6 lb

## 2018-03-23 DIAGNOSIS — R739 Hyperglycemia, unspecified: Secondary | ICD-10-CM

## 2018-03-23 DIAGNOSIS — F321 Major depressive disorder, single episode, moderate: Secondary | ICD-10-CM | POA: Diagnosis not present

## 2018-03-23 DIAGNOSIS — R634 Abnormal weight loss: Secondary | ICD-10-CM

## 2018-03-23 DIAGNOSIS — D500 Iron deficiency anemia secondary to blood loss (chronic): Secondary | ICD-10-CM

## 2018-03-23 DIAGNOSIS — R7989 Other specified abnormal findings of blood chemistry: Secondary | ICD-10-CM | POA: Diagnosis not present

## 2018-03-23 DIAGNOSIS — G4700 Insomnia, unspecified: Secondary | ICD-10-CM

## 2018-03-23 MED ORDER — ESCITALOPRAM OXALATE 10 MG PO TABS: 10.0000 mg | ORAL_TABLET | Freq: Every day | ORAL | Status: DC

## 2018-03-23 MED ORDER — DOXYLAMINE SUCCINATE (SLEEP) 25 MG PO TABS: 25.0000 mg | ORAL_TABLET | Freq: Every evening | ORAL | 0 refills | Status: DC | PRN

## 2018-03-23 NOTE — Addendum Note (Signed)
Addended by: Inda Coke on: 03/23/2018 09:08 AM   Modules accepted: Orders

## 2018-03-23 NOTE — Progress Notes (Signed)
Name: Sharon Herring   MRN: [id number]    DOB: 1976-02-22   Date:03/23/2018       Progress Note  Subjective  Chief Complaint  Chief Complaint  Patient presents with  . Insomnia    issues with sleeping for the past 2 weeks. patient stated that she will go to sleep but cant stay alseep. has tried melantoini  . Fatigue    HPI  Insomnia: for the last 2 weeks she has had significant trouble sleeping; she tried Melatonin three times (50m) - this usually works for her but right now it's only giving her about 2-3 hours of sleep. She feels really emotionally labile. She says nothing new in particular is different -no new stressors, not under any particular increased stress.She states when is able to sleep she has difficulty waking up in am's.   Major depression: Phq9 of 13, feeling tired, no motivation to drive to see her mother, lack of appetite and loss of 15 lbs in 2 months. She has been crying more often, she states she feels very tired all the time. She denies suicidal thoughts or ideation  Decreased Appetite: She tries to make herself eat lunch, but isn't really hungry; not eating breakfast or dinner.  She lost 15lbs since June.  She denies nausea, dysphagia, constipation, diarrhea, abdominal pain, fevers, or chills. We will recheck TSH   Obesity/Prediabes: Was taking Qysmia but didn't have a refill so has been out for about a month - see above regarding decreased appetite.  Walking everyday at lunch for about 20 minutes.  Anemia: iron deficiency from heavy cycles, seeing Dr. CMarcelline Matesand is taking medication only during periods to decrease volume, currently lasting about 4-5 days and not as heavy. Last hgb was down to 8.8 and needs iron infusion, since it may be part of the cause of fatigue but does not justify other symptoms.   Patient Active Problem List   Diagnosis Date Noted  . Lymphadenopathy 01/16/2018  . Moderate obstructive sleep apnea 12/19/2017  . Swelling of limb 01/27/2017  .  B12 deficiency 03/28/2016  . Vitamin D deficiency 03/28/2016  . Hyperglycemia 03/28/2016  . Multiple gastric polyps 01/08/2016  . Menorrhagia 11/13/2015  . Chronic constipation 09/23/2015  . Obesity, Class I, BMI 30.0-34.9 (see actual BMI) 06/02/2015  . Iron deficiency anemia 03/14/2014  . History of uterine fibroid 03/14/2014    Past Surgical History:  Procedure Laterality Date  . APPENDECTOMY  2012  . ESOPHAGOGASTRODUODENOSCOPY (EGD) WITH PROPOFOL N/A 01/06/2016   Procedure: ESOPHAGOGASTRODUODENOSCOPY (EGD) WITH PROPOFOL;  Surgeon: JRobert Bellow MD;  Location: [facility];  Service: Endoscopy;  Laterality: N/A;  . ROBOTIC ASSISTED LAPAROSCOPIC CHOLECYSTECTOMY N/A 03/03/2016   Procedure: ROBOTIC ASSISTED LAPAROSCOPIC CHOLECYSTECTOMY ;  Surgeon: CClayburn Pert MD;  Location: [facility] ORS;  Service: General;  Laterality: N/A;  . UTERINE FIBROID SURGERY  2012    Family History  Problem Relation Age of Onset  . Hypertension Mother   . Hypertension Father   . Pancreatic cancer Father 558 . Breast cancer Neg Hx   . Colon cancer Neg Hx     Social History   Socioeconomic History  . Marital status: Single    Spouse name: Not on file  . Number of children: 0  . Years of education: Not on file  . Highest education level: Bachelor's degree (e.g., BA, AB, BS)  Occupational History  . Occupation: CMA  Social Needs  . Financial resource strain: Not hard at all  .  Food insecurity:    Worry: Never true    Inability: Never true  . Transportation needs:    Medical: No    Non-medical: No  Tobacco Use  . Smoking status: Never Smoker  . Smokeless tobacco: Never Used  Substance and Sexual Activity  . Alcohol use: No    Alcohol/week: 0.0 oz  . Drug use: No  . Sexual activity: Yes    Partners: Male    Birth control/protection: Condom  Lifestyle  . Physical activity:    Days per week: 7 days    Minutes per session: 30 min  . Stress: Not at all  Relationships  . Social  connections:    Talks on phone: More than three times a week    Gets together: More than three times a week    Attends religious service: More than 4 times per year    Active member of club or organization: Yes    Attends meetings of clubs or organizations: More than 4 times per year    Relationship status: Never married  . Intimate partner violence:    Fear of current or ex partner: No    Emotionally abused: No    Physically abused: No    Forced sexual activity: No  Other Topics Concern  . Not on file  Social History Narrative   Lives alone, works for [employer] at the [employer] , but switching to [employer]      Current Outpatient Medications:  .  BENZEPRO FOAMING CLOTHS 6 % MISC, Apply 1 each topically every other day., Disp: , Rfl:  .  cholecalciferol (VITAMIN D) 1000 units tablet, Take 1 tablet (1,000 Units total) by mouth daily., Disp: 30 tablet, Rfl: 0 .  fluticasone (FLONASE) 50 MCG/ACT nasal spray, Place 2 sprays into both nostrils daily., Disp: 16 g, Rfl: 6 .  furosemide (LASIX) 20 MG tablet, Take 1 tablet (20 mg total) by mouth daily., Disp: 30 tablet, Rfl: 1 .  ibuprofen (ADVIL,MOTRIN) 800 MG tablet, Take 1 tablet (800 mg total) by mouth every 8 (eight) hours as needed., Disp: 60 tablet, Rfl: 3 .  iron polysaccharides (NIFEREX) 150 MG capsule, Take 150 mg by mouth every other day., Disp: , Rfl:  .  linaclotide (LINZESS) 145 MCG CAPS capsule, Take 1 capsule (145 mcg total) by mouth daily before breakfast., Disp: 30 capsule, Rfl: 2 .  Multiple Vitamins-Minerals (MULTIVITAMIN ADULT) TABS, Take 1 tablet by mouth daily., Disp: 30 tablet, Rfl: 0 .  Phentermine-Topiramate (QSYMIA) 7.5-46 MG CP24, Take 1 tablet by mouth daily., Disp: 30 capsule, Rfl: 0 .  Respiratory Therapy Supplies DEVI, 1 kit by Does not apply route daily., Disp: 1 each, Rfl: 0 .  SULFACLEANSE 8/4 8-4 % SUSP, Apply 1 drop topically daily., Disp: , Rfl:  .  tranexamic acid (LYSTEDA) 650 MG TABS tablet, Take 2 tablets (1,300  mg total) by mouth 3 (three) times daily. Take during menses for a maximum of five days, Disp: 30 tablet, Rfl: 11 .  triamcinolone cream (KENALOG) 0.1 %, Apply 1 application topically 2 (two) times daily., Disp: 30 g, Rfl: 0  Allergies  Allergen Reactions  . Aspirin Nausea And Vomiting  . Oxycodone-Acetaminophen Rash and Shortness Of Breath  . Tape Other (See Comments)    Burning feeling. Has left mark on skin.  Melynda Keller [Lorcaserin Hcl] Other (See Comments)    headache  . Contrave [Naltrexone-Bupropion Hcl Er]   . Dicyclomine Nausea And Vomiting  . Norethindrone Rash  ROS  Constitutional: Negative for fever , positive for  weight change.  Respiratory: Negative for cough and shortness of breath.   Cardiovascular: Negative for chest pain or palpitations.  Gastrointestinal: Negative for abdominal pain, no bowel changes.  Musculoskeletal: Negative for gait problem or joint swelling.  Skin: Negative for rash.  Neurological: Negative for dizziness or headache.  No other specific complaints in a complete review of systems (except as listed in HPI above).  Objective  Vitals:   03/23/18 0742  BP: 112/68  Pulse: 85  Resp: 16  Temp: 98 F (36.7 C)  TempSrc: Oral  SpO2: 99%  Weight: 212 lb 9.6 oz (96.4 kg)  Height: '5\' 5"'  (1.651 m)    Body mass index is 35.38 kg/m.  Physical Exam  Constitutional: Patient appears well-developed and well-nourished. Obese  No distress.  HEENT: head atraumatic, normocephalic, pupils equal and reactive to light,  neck supple, throat within normal limits Cardiovascular: Normal rate, regular rhythm and normal heart sounds.  No murmur heard. No BLE edema. Pulmonary/Chest: Effort normal and breath sounds normal. No respiratory distress. Abdominal: Soft.  There is no tenderness. Psychiatric: Patient has a normal mood and affect. behavior is normal. Judgment and thought content normal.  PHQ2/9: Depression screen [facility] 2/9 03/23/2018 12/14/2017 08/05/2017  04/17/2017 04/07/2017  Decreased Interest 1 0 0 0 0  Down, Depressed, Hopeless 1 0 1 0 0  PHQ - 2 Score 2 0 1 0 0  Altered sleeping 3 - 2 - -  Tired, decreased energy 3 - 1 - -  Change in appetite 3 - 1 - -  Feeling bad or failure about yourself  0 - 0 - -  Trouble concentrating 2 - 1 - -  Moving slowly or fidgety/restless 0 - 0 - -  Suicidal thoughts 0 - 0 - -  PHQ-9 Score 13 - 6 - -  Difficult doing work/chores Very difficult - Not difficult at all - -     Fall Risk: Fall Risk  03/23/2018 12/14/2017 11/01/2017 08/05/2017 04/17/2017  Falls in the past year? No No No No No     Functional Status Survey: Is the patient deaf or have difficulty hearing?: No Does the patient have difficulty seeing, even when wearing glasses/contacts?: No Does the patient have difficulty concentrating, remembering, or making decisions?: Yes Does the patient have difficulty walking or climbing stairs?: No Does the patient have difficulty dressing or bathing?: No Does the patient have difficulty doing errands alone such as visiting a doctor's office or shopping?: No   Assessment & Plan  1. Unintentional weight loss of more than 10 pounds in 90 days  - COMPLETE METABOLIC PANEL WITH GFR - HIV antibody  2. Moderate major depression (HCC)  - escitalopram (LEXAPRO) 10 MG tablet; Take 1 tablet (10 mg total) by mouth daily.  Dispense: 30 tablet; Refill: 00  3. Hyperglycemia  - Hemoglobin A1c  4. Abnormal TSH  - TSH  5. Iron deficiency anemia due to chronic blood loss  - CBC with Differential/Platelet - Iron, TIBC and Ferritin Panel; Future  6. Insomnia, unspecified type  - doxylamine, Sleep, (UNISOM) 25 MG tablet; Take 1 tablet (25 mg total) by mouth at bedtime as needed.  Dispense: 30 tablet; Refill: 0

## 2018-03-24 LAB — CBC WITH DIFFERENTIAL/PLATELET
Basophils Absolute: 32 cells/uL (ref 0–200)
Basophils Relative: 0.7 %
EOS PCT: 1.1 %
Eosinophils Absolute: 51 cells/uL (ref 15–500)
HCT: 28.6 % — ABNORMAL LOW (ref 35.0–45.0)
HEMOGLOBIN: 8.3 g/dL — AB (ref 11.7–15.5)
LYMPHS ABS: 2157 {cells}/uL (ref 850–3900)
MCH: 19.8 pg — AB (ref 27.0–33.0)
MCHC: 29 g/dL — ABNORMAL LOW (ref 32.0–36.0)
MCV: 68.3 fL — ABNORMAL LOW (ref 80.0–100.0)
MPV: 9.6 fL (ref 7.5–12.5)
Monocytes Relative: 8.5 %
NEUTROS ABS: 1969 {cells}/uL (ref 1500–7800)
Neutrophils Relative %: 42.8 %
Platelets: 557 10*3/uL — ABNORMAL HIGH (ref 140–400)
RBC: 4.19 10*6/uL (ref 3.80–5.10)
RDW: 16.5 % — ABNORMAL HIGH (ref 11.0–15.0)
Total Lymphocyte: 46.9 %
WBC mixed population: 391 cells/uL (ref 200–950)
WBC: 4.6 10*3/uL (ref 3.8–10.8)

## 2018-03-24 LAB — HEMOGLOBIN A1C
Hgb A1c MFr Bld: 6.3 % of total Hgb — ABNORMAL HIGH (ref <5.7)
Mean Plasma Glucose: 134 (calc)
eAG (mmol/L): 7.4 (calc)

## 2018-03-24 LAB — COMPLETE METABOLIC PANEL WITH GFR
AG Ratio: 1.3 (calc) (ref 1.0–2.5)
ALKALINE PHOSPHATASE (APISO): 70 U/L (ref 33–115)
ALT: 9 U/L (ref 6–29)
AST: 11 U/L (ref 10–30)
Albumin: 4.2 g/dL (ref 3.6–5.1)
BUN: 7 mg/dL (ref 7–25)
CALCIUM: 9.4 mg/dL (ref 8.6–10.2)
CO2: 28 mmol/L (ref 20–32)
CREATININE: 0.74 mg/dL (ref 0.50–1.10)
Chloride: 105 mmol/L (ref 98–110)
GFR, EST NON AFRICAN AMERICAN: 101 mL/min/{1.73_m2} (ref >=60)
GFR, Est African American: 117 mL/min/{1.73_m2} (ref >=60)
GLOBULIN: 3.2 g/dL (ref 1.9–3.7)
GLUCOSE: 102 mg/dL — AB (ref 65–99)
Potassium: 4.5 mmol/L (ref 3.5–5.3)
SODIUM: 139 mmol/L (ref 135–146)
Total Bilirubin: 0.3 mg/dL (ref 0.2–1.2)
Total Protein: 7.4 g/dL (ref 6.1–8.1)

## 2018-03-24 LAB — IRON,TIBC AND FERRITIN PANEL
%SAT: 5 % (calc) — ABNORMAL LOW (ref 16–45)
FERRITIN: 4 ng/mL — AB (ref 16–154)
IRON: 21 ug/dL — AB (ref 40–190)
TIBC: 436 mcg/dL (calc) (ref 250–450)

## 2018-03-24 LAB — TSH: TSH: 1.73 m[IU]/L

## 2018-03-24 LAB — HIV ANTIBODY (ROUTINE TESTING W REFLEX): HIV: NONREACTIVE

## 2018-03-30 ENCOUNTER — Inpatient Hospital Stay: Payer: No Typology Code available for payment source

## 2018-03-30 ENCOUNTER — Encounter: Payer: Self-pay | Admitting: Family Medicine

## 2018-03-30 ENCOUNTER — Inpatient Hospital Stay: Payer: No Typology Code available for payment source | Admitting: Urgent Care

## 2018-04-06 ENCOUNTER — Ambulatory Visit: Payer: No Typology Code available for payment source

## 2018-04-06 ENCOUNTER — Ambulatory Visit: Payer: No Typology Code available for payment source | Admitting: Urgent Care

## 2018-04-13 ENCOUNTER — Encounter: Payer: Self-pay | Admitting: Hematology and Oncology

## 2018-04-13 ENCOUNTER — Inpatient Hospital Stay
Payer: No Typology Code available for payment source | Attending: Hematology and Oncology | Admitting: Hematology and Oncology

## 2018-04-13 ENCOUNTER — Inpatient Hospital Stay: Payer: No Typology Code available for payment source

## 2018-04-13 ENCOUNTER — Other Ambulatory Visit: Payer: Self-pay | Admitting: Hematology and Oncology

## 2018-04-13 VITALS — BP 116/83 | HR 92 | Temp 95.7°F | Resp 18 | Wt 215.1 lb

## 2018-04-13 DIAGNOSIS — D500 Iron deficiency anemia secondary to blood loss (chronic): Secondary | ICD-10-CM

## 2018-04-13 DIAGNOSIS — N920 Excessive and frequent menstruation with regular cycle: Secondary | ICD-10-CM | POA: Diagnosis not present

## 2018-04-13 DIAGNOSIS — E538 Deficiency of other specified B group vitamins: Secondary | ICD-10-CM | POA: Diagnosis not present

## 2018-04-13 DIAGNOSIS — D509 Iron deficiency anemia, unspecified: Secondary | ICD-10-CM

## 2018-04-13 LAB — CBC WITH DIFFERENTIAL/PLATELET
Basophils Absolute: 0 10*3/uL (ref 0–0.1)
Basophils Relative: 1 %
Eosinophils Absolute: 0.1 10*3/uL (ref 0–0.7)
Eosinophils Relative: 1 %
HCT: 26.8 % — ABNORMAL LOW (ref 35.0–47.0)
Hemoglobin: 8.2 g/dL — ABNORMAL LOW (ref 12.0–16.0)
Lymphocytes Relative: 33 %
Lymphs Abs: 1.9 10*3/uL (ref 1.0–3.6)
MCH: 19.9 pg — ABNORMAL LOW (ref 26.0–34.0)
MCHC: 30.7 g/dL — ABNORMAL LOW (ref 32.0–36.0)
MCV: 64.8 fL — ABNORMAL LOW (ref 80.0–100.0)
Monocytes Absolute: 0.4 10*3/uL (ref 0.2–0.9)
Monocytes Relative: 7 %
Neutro Abs: 3.4 10*3/uL (ref 1.4–6.5)
Neutrophils Relative %: 58 %
Platelets: 569 10*3/uL — ABNORMAL HIGH (ref 150–440)
RBC: 4.14 MIL/uL (ref 3.80–5.20)
RDW: 19.1 % — ABNORMAL HIGH (ref 11.5–14.5)
WBC: 5.9 10*3/uL (ref 3.6–11.0)

## 2018-04-13 LAB — SAMPLE TO BLOOD BANK

## 2018-04-13 LAB — IRON AND TIBC
Iron: 17 ug/dL — ABNORMAL LOW (ref 28–170)
Saturation Ratios: 4 % — ABNORMAL LOW (ref 10.4–31.8)
TIBC: 463 ug/dL — ABNORMAL HIGH (ref 250–450)
UIBC: 446 ug/dL

## 2018-04-13 LAB — FOLATE: Folate: 10.4 ng/mL (ref >5.9)

## 2018-04-13 LAB — FERRITIN: Ferritin: 4 ng/mL — ABNORMAL LOW (ref 11–307)

## 2018-04-13 MED ORDER — SODIUM CHLORIDE 0.9 % IV SOLN
510.0000 mg | Freq: Once | INTRAVENOUS | Status: AC
  Administered 2018-04-13: 510 mg via INTRAVENOUS
  Filled 2018-04-13: qty 17

## 2018-04-13 MED ORDER — SODIUM CHLORIDE 0.9 % IV SOLN
INTRAVENOUS | Status: DC
  Administered 2018-04-13: 16:00:00 via INTRAVENOUS
  Filled 2018-04-13: qty 250

## 2018-04-13 NOTE — Progress Notes (Signed)
Patient continues to have problems sleeping.  She also states her appetite is decreased.  Otherwise, no other complaints today.

## 2018-04-13 NOTE — Progress Notes (Signed)
[facility] day:  04/13/2018  Chief Complaint: Sharon Herring is a 42 y.o. female with iron deficiency anemia who is seen for reassessment.  HPI: The patient was last seen in the hematology clinic on 02/10/2017.  At that time, she felt fatigued.  Menses was still heavy.  Ice pica had resolved.  Exam was unremarkable.  Hematocrit has drifted down to 31.4.  RBCs remained microcytic.   She received Feraheme x 2 (02/13/2017 and 02/28/2017).   Patient was essentially lost to follow up following her last iron infusions in 02/2017.  CBC on 03/23/2018 revealed a hematocrit of 28.3, hemoglobin 8.3, MCV 68.3, platelets 557,000, and WBC 4,600.  Ferritin was 4 with an iron saturation of 5% and a TIBC of 436.  TSH was 1.73.  HIV testing was negative.  During the interim, patient has been doing "ok". Patient notes that she has been very fatigued. She has exertional shortness of breath. Patient denies chest pain. She has ice pica. She states, "I eat ice all day". Patient denies restless legs, however describes a "deep aching".  Patient advises that she has continued heavy menses. She has been seen in follow up consult by GYN. She was put on a medication (Lysteda) that was supposed to shorten the duration and intensity of her cycles.    Past Medical History:  Diagnosis Date  . Anemia   . Dizziness   . Fibroid uterus   . GERD (gastroesophageal reflux disease)   . IBS (irritable bowel syndrome)   . Migraine   . Polyp of stomach 01/06/2016   Stomach polyp, pyloric, consistent with prolapse.     Past Surgical History:  Procedure Laterality Date  . APPENDECTOMY  2012  . ESOPHAGOGASTRODUODENOSCOPY (EGD) WITH PROPOFOL N/A 01/06/2016   Procedure: ESOPHAGOGASTRODUODENOSCOPY (EGD) WITH PROPOFOL;  Surgeon: Robert Bellow, MD;  Location: [facility] ENDOSCOPY;  Service: Endoscopy;  Laterality: N/A;  . ROBOTIC ASSISTED LAPAROSCOPIC CHOLECYSTECTOMY N/A 03/03/2016    Procedure: ROBOTIC ASSISTED LAPAROSCOPIC CHOLECYSTECTOMY ;  Surgeon: Clayburn Pert, MD;  Location: [facility] ORS;  Service: General;  Laterality: N/A;  . UTERINE FIBROID SURGERY  2012    Family History  Problem Relation Age of Onset  . Hypertension Mother   . Hypertension Father   . Pancreatic cancer Father 66  . Breast cancer Neg Hx   . Colon cancer Neg Hx     Social History:  reports that she has never smoked. She has never used smokeless tobacco. She reports that she does not drink alcohol or use drugs.  She denies any exposure to radiation or toxins.  She works at [employer] as a Psychologist, sport and exercise.  She lives in [place].  The patient is alone today.  Allergies:  Allergies  Allergen Reactions  . Aspirin Nausea And Vomiting  . Oxycodone-Acetaminophen Rash and Shortness Of Breath  . Lexapro [Escitalopram] Palpitations  . Tape Other (See Comments)    Burning feeling. Has left mark on skin.  Melynda Keller [Lorcaserin Hcl] Other (See Comments)    headache  . Contrave [Naltrexone-Bupropion Hcl Er]   . Dicyclomine Nausea And Vomiting  . Norethindrone Rash    Current Medications: Current Outpatient Medications  Medication Sig Dispense Refill  . BENZEPRO FOAMING CLOTHS 6 % MISC Apply 1 each topically every other day.    . cholecalciferol (VITAMIN D) 1000 units tablet Take 1 tablet (1,000 Units total) by mouth daily. 30 tablet 0  . doxylamine, Sleep, (UNISOM) 25 MG tablet Take 1  tablet (25 mg total) by mouth at bedtime as needed. 30 tablet 0  . fluticasone (FLONASE) 50 MCG/ACT nasal spray Place 2 sprays into both nostrils daily. 16 g 6  . furosemide (LASIX) 20 MG tablet Take 1 tablet (20 mg total) by mouth daily. 30 tablet 1  . ibuprofen (ADVIL,MOTRIN) 800 MG tablet Take 1 tablet (800 mg total) by mouth every 8 (eight) hours as needed. 60 tablet 3  . iron polysaccharides (NIFEREX) 150 MG capsule Take 150 mg by mouth every other day.    . linaclotide (LINZESS) 145 MCG CAPS capsule Take 1  capsule (145 mcg total) by mouth daily before breakfast. 30 capsule 2  . Multiple Vitamins-Minerals (MULTIVITAMIN ADULT) TABS Take 1 tablet by mouth daily. 30 tablet 0  . Respiratory Therapy Supplies DEVI 1 kit by Does not apply route daily. 1 each 0  . SULFACLEANSE 8/4 8-4 % SUSP Apply 1 drop topically daily.    . tranexamic acid (LYSTEDA) 650 MG TABS tablet Take 2 tablets (1,300 mg total) by mouth 3 (three) times daily. Take during menses for a maximum of five days 30 tablet 11  . triamcinolone cream (KENALOG) 0.1 % Apply 1 application topically 2 (two) times daily. 30 g 0   No current facility-administered medications for this visit.     Review of Systems:  GENERAL:  Very low energy.  No fevers, sweats or weight loss. PERFORMANCE STATUS (ECOG):  0-1 HEENT:  No visual changes, runny nose, sore throat, mouth sores or tenderness. Lungs: Shortness of breath with exertion.  No cough.  No hemoptysis. Cardiac:  No chest pain, palpitations, orthopnea, or PND. GI:  No nausea, vomiting, diarrhea, constipation, melena or hematochezia.  Pica. GU:  No urgency, frequency, dysuria, or hematuria.   Menses less heavy. Musculoskeletal:  No back pain.  No joint pain.  No muscle tenderness. Extremities:  No pain or swelling. Skin:  No rashes or skin changes. Neuro:  Restless legs.  No headache, numbness or weakness, balance or coordination issues. Endocrine:  No diabetes, thyroid issues, hot flashes or night sweats. Psych:  No mood changes, depression or anxiety. Pain:  No focal pain. Review of systems:  All other systems reviewed and found to be negative.   Physical Exam: Blood pressure 116/83, pulse 92, temperature (!) 95.7 F (35.4 C), temperature source Tympanic, resp. rate 18, weight 215 lb 1 oz (97.6 kg), last menstrual period 03/16/2018, SpO2 100 %. GENERAL:  Well developed, well nourished, woman sitting comfortably in the exam room in no acute distress. MENTAL STATUS:  Alert and oriented to  person, place and time. HEAD:  Black hair.  Normocephalic, atraumatic, face symmetric, no Cushingoid features. EYES:  Brown eyes.  Pupils equal round and reactive to light and accomodation.  No conjunctivitis or scleral icterus. ENT:  Oropharynx clear without lesion.  Tongue normal. Mucous membranes moist.  RESPIRATORY:  Clear to auscultation without rales, wheezes or rhonchi. CARDIOVASCULAR:  Regular rate and rhythm without murmur, rub or gallop. ABDOMEN:  Soft, non-tender, with active bowel sounds, and no hepatosplenomegaly.  No masses. SKIN:  No rashes, ulcers or lesions. EXTREMITIES: No edema, no skin discoloration or tenderness.  No palpable cords. LYMPH NODES: No palpable cervical, supraclavicular, axillary or inguinal adenopathy  NEUROLOGICAL: Unremarkable. PSYCH:  Appropriate.    No visits with results within 3 Day(s) from this visit.  Latest known visit with results is:  Office Visit on 03/23/2018  Component Date Value Ref Range Status  . Glucose, Bld 03/23/2018 102*  65 - 99 mg/dL Final   Comment: .            Fasting reference interval . For someone without known diabetes, a glucose value between 100 and 125 mg/dL is consistent with prediabetes and should be confirmed with a follow-up test. .   . BUN 03/23/2018 7  7 - 25 mg/dL Final  . Creat 03/23/2018 0.74  0.50 - 1.10 mg/dL Final  . GFR, Est Non African American 03/23/2018 101  > OR = 60 mL/min/1.16m Final  . GFR, Est African American 03/23/2018 117  > OR = 60 mL/min/1.738mFinal  . BUN/Creatinine Ratio 0827/25/3664OT APPLICABLE  6 - 22 (calc) Final  . Sodium 03/23/2018 139  135 - 146 mmol/L Final  . Potassium 03/23/2018 4.5  3.5 - 5.3 mmol/L Final  . Chloride 03/23/2018 105  98 - 110 mmol/L Final  . CO2 03/23/2018 28  20 - 32 mmol/L Final  . Calcium 03/23/2018 9.4  8.6 - 10.2 mg/dL Final  . Total Protein 03/23/2018 7.4  6.1 - 8.1 g/dL Final  . Albumin 03/23/2018 4.2  3.6 - 5.1 g/dL Final  . Globulin 03/23/2018  3.2  1.9 - 3.7 g/dL (calc) Final  . AG Ratio 03/23/2018 1.3  1.0 - 2.5 (calc) Final  . Total Bilirubin 03/23/2018 0.3  0.2 - 1.2 mg/dL Final  . Alkaline phosphatase (APISO) 03/23/2018 70  33 - 115 U/L Final  . AST 03/23/2018 11  10 - 30 U/L Final  . ALT 03/23/2018 9  6 - 29 U/L Final  . HIV 1&2 Ab, 4th Generation 03/23/2018 NON-REACTIVE  NON-REACTI Final   Comment: HIV-1 antigen and HIV-1/HIV-2 antibodies were not detected. There is no laboratory evidence of HIV infection. . Marland KitchenLEASE NOTE: This information has been disclosed to you from records whose confidentiality may be protected by state law.  If your state requires such protection, then the state law prohibits you from making any further disclosure of the information without the specific written consent of the person to whom it pertains, or as otherwise permitted by law. A general authorization for the release of medical or other information is NOT sufficient for this purpose. . For additional information please refer to http://education.questdiagnostics.com/faq/FAQ106 (This link is being provided for informational/ educational purposes only.) . . Marland Kitchenhe performance of this assay has not been clinically validated in patients less than 2 55ears old. .   . Hgb A1c MFr Bld 03/23/2018 6.3* <5.7 % of total Hgb Final   Comment: For someone without known diabetes, a hemoglobin  A1c value between 5.7% and 6.4% is consistent with prediabetes and should be confirmed with a  follow-up test. . For someone with known diabetes, a value <7% indicates that their diabetes is well controlled. A1c targets should be individualized based on duration of diabetes, age, comorbid conditions, and other considerations. . This assay result is consistent with an increased risk of diabetes. . Currently, no consensus exists regarding use of hemoglobin A1c for diagnosis of diabetes for children. .   . Mean Plasma Glucose 03/23/2018 134  (calc) Final   . eAG (mmol/L) 03/23/2018 7.4  (calc) Final  . TSH 03/23/2018 1.73  mIU/L Final   Comment:           Reference Range .           > or = 20 Years  0.40-4.50 .                Pregnancy Ranges  First trimester    0.26-2.66           Second trimester   0.55-2.73           Third trimester    0.43-2.91   . WBC 03/23/2018 4.6  3.8 - 10.8 Thousand/uL Final  . RBC 03/23/2018 4.19  3.80 - 5.10 Million/uL Final  . Hemoglobin 03/23/2018 8.3* 11.7 - 15.5 g/dL Final  . HCT 03/23/2018 28.6* 35.0 - 45.0 % Final  . MCV 03/23/2018 68.3* 80.0 - 100.0 fL Final  . MCH 03/23/2018 19.8* 27.0 - 33.0 pg Final  . MCHC 03/23/2018 29.0* 32.0 - 36.0 g/dL Final  . RDW 03/23/2018 16.5* 11.0 - 15.0 % Final  . Platelets 03/23/2018 557* 140 - 400 Thousand/uL Final  . MPV 03/23/2018 9.6  7.5 - 12.5 fL Final  . Neutro Abs 03/23/2018 1,969  1,500 - 7,800 cells/uL Final  . Lymphs Abs 03/23/2018 2,157  850 - 3,900 cells/uL Final  . WBC mixed population 03/23/2018 391  200 - 950 cells/uL Final  . Eosinophils Absolute 03/23/2018 51  15 - 500 cells/uL Final  . Basophils Absolute 03/23/2018 32  0 - 200 cells/uL Final  . Neutrophils Relative % 03/23/2018 42.8  % Final  . Total Lymphocyte 03/23/2018 46.9  % Final  . Monocytes Relative 03/23/2018 8.5  % Final  . Eosinophils Relative 03/23/2018 1.1  % Final  . Basophils Relative 03/23/2018 0.7  % Final  . Smear Review 03/23/2018    Final   Comment: Review of peripheral smear confirms automated results.   . Iron 03/23/2018 21* 40 - 190 mcg/dL Final  . TIBC 03/23/2018 436  250 - 450 mcg/dL (calc) Final  . %SAT 03/23/2018 5* 16 - 45 % (calc) Final  . Ferritin 03/23/2018 4* 16 - 154 ng/mL Final    Assessment:  Sharon Herring is a 42 y.o. female with iron deficiency anemia secondary to heavy menses.  She has a history of on and off anemia for several years (before 02/2014).  Diet is fairly good.  She denies any melena, hematochezia, or hematuria.  She has had ice  pica x 2 months.  She has a history of unresponsiveness to oral iron (4 month trial in 2015).  Labs on 07/21/2016 revealed a hematocrit of 30.9, hemoglobin 9.3, MCV 73.4, platelets 675,000, white count 5300 with an Raoul of 2650. Creatinine was 0.7.  Liver function tests were normal.  Iron studies included a ferritin of 6, iron saturation 6% and TIBC of 440.  HIV testing was negative.  Labs on 08/25/2016 revealed a hematocrit of 28.3, hemoglobin 9.2, and MCV 69.6.  Ferritin was 5.  She received Feraheme 510 mg IV on 08/29/2016 and 09/05/2016.  Ferritin goal is 100.  Ferritin has been followed:  18 on 12/02/2015, 6 on 07/21/2016, 5 on 08/25/2016, 214 on 09/21/2016, 97 on 10/10/2016, 21 on 12/08/2016, 12 on 02/09/2017, 4 on 03/23/2018, and 4 on 04/13/2018.  She has B12 deficiency.  She received B12 shots x 2-3 months.  B12 was 462 on 07/21/2016 and 434 on 12/14/2017.  Symptomatically, she is very fatigued.  Menses remain heavy.  She has ice pica.  Hemoglobin is 8.3.  Ferritin is 4.  Plan:  1.  Labs today:  CBC with diff, ferritin, iron studies, folate, hold tube. 2.  Iron deficiency anemia:  Etiology felt secondary to heavy menses (somewhat improved).  Discus reinstitution of IV iron.  Feraheme today and in 1 week. 3.  RTC in 3  months for MD assessment, labs (CBC with diff, ferritin, sed rate), and +/- Feraheme.   Honor Loh, NP  04/13/2018, 3:00 PM   I saw and evaluated the patient, participating in the key portions of the service and reviewing pertinent diagnostic studies and records.  I reviewed the nurse practitioner's note and agree with the findings and the plan.  The assessment and plan were discussed with the patient.  Several questions were asked by the patient and answered.   Nolon Stalls, MD 04/13/2018, 3:00 PM

## 2018-04-17 ENCOUNTER — Ambulatory Visit: Payer: No Typology Code available for payment source | Admitting: General Surgery

## 2018-04-19 ENCOUNTER — Ambulatory Visit: Payer: No Typology Code available for payment source | Admitting: General Surgery

## 2018-04-20 ENCOUNTER — Inpatient Hospital Stay: Payer: No Typology Code available for payment source

## 2018-04-20 VITALS — BP 121/82 | HR 83 | Temp 97.5°F | Resp 18

## 2018-04-20 DIAGNOSIS — D509 Iron deficiency anemia, unspecified: Secondary | ICD-10-CM

## 2018-04-20 DIAGNOSIS — D500 Iron deficiency anemia secondary to blood loss (chronic): Secondary | ICD-10-CM | POA: Diagnosis not present

## 2018-04-20 MED ORDER — SODIUM CHLORIDE 0.9 % IV SOLN
510.0000 mg | Freq: Once | INTRAVENOUS | Status: AC
  Administered 2018-04-20: 510 mg via INTRAVENOUS
  Filled 2018-04-20: qty 17

## 2018-04-20 MED ORDER — SODIUM CHLORIDE 0.9 % IV SOLN
Freq: Once | INTRAVENOUS | Status: AC
  Administered 2018-04-20: 14:00:00 via INTRAVENOUS
  Filled 2018-04-20: qty 250

## 2018-05-01 ENCOUNTER — Other Ambulatory Visit: Payer: Self-pay

## 2018-05-01 DIAGNOSIS — R1013 Epigastric pain: Secondary | ICD-10-CM

## 2018-05-02 LAB — H. PYLORI BREATH TEST: H. PYLORI BREATH TEST: NOT DETECTED

## 2018-06-23 ENCOUNTER — Telehealth: Payer: No Typology Code available for payment source | Admitting: Physician Assistant

## 2018-06-23 DIAGNOSIS — R1110 Vomiting, unspecified: Secondary | ICD-10-CM

## 2018-06-23 DIAGNOSIS — R197 Diarrhea, unspecified: Principal | ICD-10-CM

## 2018-06-23 DIAGNOSIS — R42 Dizziness and giddiness: Secondary | ICD-10-CM

## 2018-06-23 NOTE — Progress Notes (Signed)
Based on what you shared with me it looks like you have a serious condition that should be evaluated in a face to face office visit. It sounds like you have a gastroenteritis. Giving your mention of dizziness and lightheadedness you need assessment to make sure you do not need iv fluids and to be given some high powered anti-vomiting medications.  NOTE: If you entered your credit card information for this eVisit, you will not be charged. You may see a "hold" on your card for the $30 but that hold will drop off and you will not have a charge processed.  If you are having a true medical emergency please call 911.  If you need an urgent face to face visit, [facility] has four urgent care centers for your convenience.  If you need care fast and have a high deductible or no insurance consider: ?  [email or web address] to reserve your spot online an avoid wait times  [facility] [address], [address] [place], [place] [address] 8 am to 8 pm Monday-Friday 10 am to 4 pm Saturday-Sunday *Across the street from [employer]  [place], [address] 8 am to 5 pm Monday-Friday * In the [facility] on the [facility]   The following sites will take your insurance:  [facility] a Provider at this Location  [place], [address] [address] 10 am to 8 pm Monday-Friday 12 pm to 8 pm [facility] [place] a Provider at this Location  [place], [address] [place], [place] [address] 8 am to 8 pm Monday-Friday 9 am to 6 pm Saturday 11 am to 6 pm Sunday   [facility]  [phone] Get Driving Directions  [address] [place], [place] [address] 8 am to 8 pm Monday-Friday 8 am to 4 pm Saturday-Sunday   Your  e-visit answers were reviewed by a board certified advanced clinical practitioner to complete your personal care plan.  Thank you for using e-Visits.  ===View-only below this line===   ----- Message -----    From: [name]    Sent: 06/23/2018  4:25 PM EDT      To: E-Visit Mailing List Subject: E-Visit Submission: Diarrhea  E-Visit Submission: Diarrhea --------------------------------  Question: When did the diarrhea begin? Answer:   Today  Question: How many stools have you passed in the last 24 hours? Answer:   Five to eight  Question: Do you have a fever? Answer:   No, I do not have a fever  Question: Is there blood in your stool, or is your stool dark red or black? Answer:   None of the above  Question: Does your stool contain pus or mucus? Answer:   No, my stool does not contain pus or mucus  Question: Are you vomiting? Answer:   Yes, I am vomiting occassionally  Question: Are you able to keep down fluids? Answer:   Yes, I can keep down some fluids  Question: Do you have belly pain? Answer:   I have pain in the upper part of my belly  Question: Are you feeling dizzy or like you might pass out? Answer:   Yes  Question: Are you having trouble walking or lifting yourself due to weakness from this illness? Answer:   No  Question: Do any  of the following apply to you? Answer:   My urine is normal  Question: Did the diarrhea begin after a specific meal that may have caused the illness? Answer:   Yes, after two to six hours  Question: Please enter some  information about your meal, including what you ate that may have caused your illness. Answer:   I ate shrimp and grits for brunch today at 12:15pm and since i have come home i have had severe stomach cramping, nausea and vomiting and my head hurts  Question: Have you taken antibiotics recently? Answer:   I have not been on any antibiotics  Question: Have you been hospitalized in the past 2 months? Answer:    No, I have not been hospitalized recently  Question: Have you taken a laxative or a medicine to help you move your bowels lately? 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 Answer:   I am not sure  Question: Have you had a meal consisting of raw meat or fish in the week prior to your illness? Answer:   Yes  Question: Have you recently travelled to a place where you may have caught an illness? Answer:   No  Question: Have you tried any medication or other treatment for your symptoms? Answer:   Yes  Question: Please list the treatments you tried, and the result of those treatments. Answer:   Pepto bismol  Question: Please list your medication allergies that you may have ? (If 'none' , please list as 'none') Answer:   Aspirin             Percoet  Question: Are you pregnant? Answer:   I am confident that I am not pregnant  Question: Are you breastfeeding? Answer:   No  Question: Please list any addit

## 2018-06-26 ENCOUNTER — Ambulatory Visit: Payer: No Typology Code available for payment source | Admitting: General Surgery

## 2018-07-10 ENCOUNTER — Inpatient Hospital Stay: Payer: No Typology Code available for payment source | Attending: Hematology and Oncology

## 2018-07-10 DIAGNOSIS — D500 Iron deficiency anemia secondary to blood loss (chronic): Secondary | ICD-10-CM | POA: Insufficient documentation

## 2018-07-10 DIAGNOSIS — N920 Excessive and frequent menstruation with regular cycle: Secondary | ICD-10-CM | POA: Insufficient documentation

## 2018-07-10 LAB — CBC WITH DIFFERENTIAL/PLATELET
Abs Immature Granulocytes: 0.04 10*3/uL (ref 0.00–0.07)
Basophils Absolute: 0 10*3/uL (ref 0.0–0.1)
Basophils Relative: 1 %
Eosinophils Absolute: 0.1 10*3/uL (ref 0.0–0.5)
Eosinophils Relative: 1 %
HCT: 31.5 % — ABNORMAL LOW (ref 36.0–46.0)
Hemoglobin: 9.5 g/dL — ABNORMAL LOW (ref 12.0–15.0)
Immature Granulocytes: 1 %
Lymphocytes Relative: 41 %
Lymphs Abs: 2.5 10*3/uL (ref 0.7–4.0)
MCH: 23.5 pg — ABNORMAL LOW (ref 26.0–34.0)
MCHC: 30.2 g/dL (ref 30.0–36.0)
MCV: 78 fL — ABNORMAL LOW (ref 80.0–100.0)
Monocytes Absolute: 0.5 10*3/uL (ref 0.1–1.0)
Monocytes Relative: 8 %
Neutro Abs: 3.1 10*3/uL (ref 1.7–7.7)
Neutrophils Relative %: 48 %
Platelets: 480 10*3/uL — ABNORMAL HIGH (ref 150–400)
RBC: 4.04 MIL/uL (ref 3.87–5.11)
RDW: 20.1 % — ABNORMAL HIGH (ref 11.5–15.5)
WBC: 6.2 10*3/uL (ref 4.0–10.5)
nRBC: 0 % (ref 0.0–0.2)

## 2018-07-10 LAB — FERRITIN: Ferritin: 9 ng/mL — ABNORMAL LOW (ref 11–307)

## 2018-07-10 LAB — SEDIMENTATION RATE: Sed Rate: 35 mm/hr — ABNORMAL HIGH (ref 0–20)

## 2018-07-11 ENCOUNTER — Other Ambulatory Visit: Payer: Self-pay

## 2018-07-11 ENCOUNTER — Other Ambulatory Visit: Payer: Self-pay | Admitting: Hematology and Oncology

## 2018-07-11 ENCOUNTER — Other Ambulatory Visit: Payer: Self-pay | Admitting: Urgent Care

## 2018-07-12 ENCOUNTER — Other Ambulatory Visit: Payer: Self-pay

## 2018-07-13 ENCOUNTER — Telehealth: Payer: Self-pay | Admitting: *Deleted

## 2018-07-13 ENCOUNTER — Inpatient Hospital Stay: Payer: No Typology Code available for payment source | Admitting: Hematology and Oncology

## 2018-07-13 ENCOUNTER — Inpatient Hospital Stay: Payer: No Typology Code available for payment source

## 2018-07-13 NOTE — Telephone Encounter (Signed)
Called patient to inquire as to why she missed her appointment today.  She states she forgot.  Will have scheduling call her to reschedule.

## 2018-07-13 NOTE — Progress Notes (Deleted)
[facility] day:  07/13/2018  Chief Complaint: Sharon Herring is a 42 y.o. female with iron deficiency anemia who is seen for 3 month assessment.  HPI: The patient was last seen in the hematology clinic on 04/13/2018.  At that time, she was very fatigued.  Menses remained heavy.  She had ice pica.  Hemoglobin was 8.3.  MCV was 64.8.  Folate was 10.4.  Ferritin was 4.  She received Feraheme x 2 (04/13/2018 and 04/20/2018).   Labs on 07/10/2018 revealed a hematocrit of 31.5, hemoglobin 9.5, MCV 78.  Ferritin was 9.  During the interim,    Past Medical History:  Diagnosis Date  . Anemia   . Dizziness   . Fibroid uterus   . GERD (gastroesophageal reflux disease)   . IBS (irritable bowel syndrome)   . Migraine   . Polyp of stomach 01/06/2016   Stomach polyp, pyloric, consistent with prolapse.     Past Surgical History:  Procedure Laterality Date  . APPENDECTOMY  2012  . ESOPHAGOGASTRODUODENOSCOPY (EGD) WITH PROPOFOL N/A 01/06/2016   Procedure: ESOPHAGOGASTRODUODENOSCOPY (EGD) WITH PROPOFOL;  Surgeon: Robert Bellow, MD;  Location: [facility] ENDOSCOPY;  Service: Endoscopy;  Laterality: N/A;  . ROBOTIC ASSISTED LAPAROSCOPIC CHOLECYSTECTOMY N/A 03/03/2016   Procedure: ROBOTIC ASSISTED LAPAROSCOPIC CHOLECYSTECTOMY ;  Surgeon: Clayburn Pert, MD;  Location: [facility] ORS;  Service: General;  Laterality: N/A;  . UTERINE FIBROID SURGERY  2012    Family History  Problem Relation Age of Onset  . Hypertension Mother   . Hypertension Father   . Pancreatic cancer Father 44  . Breast cancer Neg Hx   . Colon cancer Neg Hx     Social History:  reports that she has never smoked. She has never used smokeless tobacco. She reports that she does not drink alcohol or use drugs.  She denies any exposure to radiation or toxins.  She works at [employer] as a Psychologist, sport and exercise.  She lives in [place].  The patient is alone today.  Allergies:  Allergies   Allergen Reactions  . Aspirin Nausea And Vomiting  . Oxycodone-Acetaminophen Rash and Shortness Of Breath  . Lexapro [Escitalopram] Palpitations  . Tape Other (See Comments)    Burning feeling. Has left mark on skin.  Melynda Keller [Lorcaserin Hcl] Other (See Comments)    headache  . Contrave [Naltrexone-Bupropion Hcl Er]   . Dicyclomine Nausea And Vomiting  . Norethindrone Rash    Current Medications: Current Outpatient Medications  Medication Sig Dispense Refill  . BENZEPRO FOAMING CLOTHS 6 % MISC Apply 1 each topically every other day.    . cholecalciferol (VITAMIN D) 1000 units tablet Take 1 tablet (1,000 Units total) by mouth daily. 30 tablet 0  . doxylamine, Sleep, (UNISOM) 25 MG tablet Take 1 tablet (25 mg total) by mouth at bedtime as needed. 30 tablet 0  . fluticasone (FLONASE) 50 MCG/ACT nasal spray Place 2 sprays into both nostrils daily. 16 g 6  . furosemide (LASIX) 20 MG tablet Take 1 tablet (20 mg total) by mouth daily. 30 tablet 1  . ibuprofen (ADVIL,MOTRIN) 800 MG tablet Take 1 tablet (800 mg total) by mouth every 8 (eight) hours as needed. 60 tablet 3  . iron polysaccharides (NIFEREX) 150 MG capsule Take 150 mg by mouth every other day.    . linaclotide (LINZESS) 145 MCG CAPS capsule Take 1 capsule (145 mcg total) by mouth daily before breakfast. 30 capsule 2  . Multiple Vitamins-Minerals (MULTIVITAMIN  ADULT) TABS Take 1 tablet by mouth daily. 30 tablet 0  . Respiratory Therapy Supplies DEVI 1 kit by Does not apply route daily. 1 each 0  . SULFACLEANSE 8/4 8-4 % SUSP Apply 1 drop topically daily.    . tranexamic acid (LYSTEDA) 650 MG TABS tablet Take 2 tablets (1,300 mg total) by mouth 3 (three) times daily. Take during menses for a maximum of five days 30 tablet 11  . triamcinolone cream (KENALOG) 0.1 % Apply 1 application topically 2 (two) times daily. 30 g 0   No current facility-administered medications for this visit.     Review of Systems:  GENERAL:  Very low  energy.  No fevers, sweats or weight loss. PERFORMANCE STATUS (ECOG):  0-1 HEENT:  No visual changes, runny nose, sore throat, mouth sores or tenderness. Lungs: Shortness of breath with exertion.  No cough.  No hemoptysis. Cardiac:  No chest pain, palpitations, orthopnea, or PND. GI:  No nausea, vomiting, diarrhea, constipation, melena or hematochezia.  Pica. GU:  No urgency, frequency, dysuria, or hematuria.   Menses less heavy. Musculoskeletal:  No back pain.  No joint pain.  No muscle tenderness. Extremities:  No pain or swelling. Skin:  No rashes or skin changes. Neuro:  Restless legs.  No headache, numbness or weakness, balance or coordination issues. Endocrine:  No diabetes, thyroid issues, hot flashes or night sweats. Psych:  No mood changes, depression or anxiety. Pain:  No focal pain. Review of systems:  All other systems reviewed and found to be negative.   Physical Exam: There were no vitals taken for this visit. GENERAL:  Well developed, well nourished, woman sitting comfortably in the exam room in no acute distress. MENTAL STATUS:  Alert and oriented to person, place and time. HEAD:  Black hair.  Normocephalic, atraumatic, face symmetric, no Cushingoid features. EYES:  Brown eyes.  Pupils equal round and reactive to light and accomodation.  No conjunctivitis or scleral icterus. ENT:  Oropharynx clear without lesion.  Tongue normal. Mucous membranes moist.  RESPIRATORY:  Clear to auscultation without rales, wheezes or rhonchi. CARDIOVASCULAR:  Regular rate and rhythm without murmur, rub or gallop. ABDOMEN:  Soft, non-tender, with active bowel sounds, and no hepatosplenomegaly.  No masses. SKIN:  No rashes, ulcers or lesions. EXTREMITIES: No edema, no skin discoloration or tenderness.  No palpable cords. LYMPH NODES: No palpable cervical, supraclavicular, axillary or inguinal adenopathy  NEUROLOGICAL: Unremarkable. PSYCH:  Appropriate.    No visits with results within 3  Day(s) from this visit.  Latest known visit with results is:  Appointment on 07/10/2018  Component Date Value Ref Range Status  . Sed Rate 07/10/2018 35* 0 - 20 mm/hr Final   Performed at [facility], [address] [place],  [address]  . WBC 07/10/2018 6.2  4.0 - 10.5 K/uL Final  . RBC 07/10/2018 4.04  3.87 - 5.11 MIL/uL Final  . Hemoglobin 07/10/2018 9.5* 12.0 - 15.0 g/dL Final   Comment: Reticulocyte Hemoglobin testing may be clinically indicated, consider ordering this additional test [id number]   . HCT 07/10/2018 31.5* 36.0 - 46.0 % Final  . MCV 07/10/2018 78.0* 80.0 - 100.0 fL Final  . MCH 07/10/2018 23.5* 26.0 - 34.0 pg Final  . MCHC 07/10/2018 30.2  30.0 - 36.0 g/dL Final  . RDW 07/10/2018 20.1* 11.5 - 15.5 % Final  . Platelets 07/10/2018 480* 150 - 400 K/uL Final  . nRBC 07/10/2018 0.0  0.0 - 0.2 % Final  . Neutrophils  Relative % 07/10/2018 48  % Final  . Neutro Abs 07/10/2018 3.1  1.7 - 7.7 K/uL Final  . Lymphocytes Relative 07/10/2018 41  % Final  . Lymphs Abs 07/10/2018 2.5  0.7 - 4.0 K/uL Final  . Monocytes Relative 07/10/2018 8  % Final  . Monocytes Absolute 07/10/2018 0.5  0.1 - 1.0 K/uL Final  . Eosinophils Relative 07/10/2018 1  % Final  . Eosinophils Absolute 07/10/2018 0.1  0.0 - 0.5 K/uL Final  . Basophils Relative 07/10/2018 1  % Final  . Basophils Absolute 07/10/2018 0.0  0.0 - 0.1 K/uL Final  . Immature Granulocytes 07/10/2018 1  % Final  . Abs Immature Granulocytes 07/10/2018 0.04  0.00 - 0.07 K/uL Final   Performed at [facility], [address], [place], [place] [address]  . Ferritin 07/10/2018 9* 11 - 307 ng/mL Final   Performed at [facility], [address] [place], [place] [address]    Assessment:  CYENNA REBELLO is a 42 y.o. female with iron deficiency anemia secondary to heavy menses.  She has a history of on and off anemia for several years (before 02/2014).  Diet is fairly good.  She denies any melena,  hematochezia, or hematuria.  She has had ice pica x 2 months.  She has a history of unresponsiveness to oral iron (4 month trial in 2015).  Labs on 07/21/2016 revealed a hematocrit of 30.9, hemoglobin 9.3, MCV 73.4, platelets 675,000, white count 5300 with an [place] of 2650. Creatinine was 0.7.  Liver function tests were normal.  Iron studies included a ferritin of 6, iron saturation 6% and TIBC of 440.  HIV testing was negative.  Labs on 08/25/2016 revealed a hematocrit of 28.3, hemoglobin 9.2, and MCV 69.6.  Ferritin was 5.  She received Feraheme 510 mg IV on 08/29/2016 and 09/05/2016.  Ferritin goal is 100.  Ferritin has been followed:  18 on 12/02/2015, 6 on 07/21/2016, 5 on 08/25/2016, 214 on 09/21/2016, 97 on 10/10/2016, 21 on 12/08/2016, 12 on 02/09/2017, 4 on 03/23/2018, 4 on 04/13/2018, and 9 on 07/10/2018.  She has B12 deficiency.  She received B12 shots x 2-3 months (last 01/04/2017).  B12 was 462 on 07/21/2016 and 434 on 12/14/2017.  Folate was 10.4 on 04/13/2018.  Symptomatically,  she is very fatigued.  Menses remain heavy.  She has ice pica.  Hemoglobin is 8.3.  Ferritin is 4.  Plan:  1.  Review labs from 07/10/2018.  Hemoglobin improving.  RBC microcytic.  Ferritin remains low (9).   2.  Iron deficiency anemia:  Etiology felt secondary to heavy menses (somewhat improved).  Discus reinstitution of IV iron.  Feraheme today and in 1 week. 3.  B12 deficiency:  4.  Menorrhagia:   RTC in 3 months for MD assessment, labs (CBC with diff, ferritin, sed rate), and +/- Feraheme.   Lequita Asal, MD  07/13/2018, 6:29 AM   I saw and evaluated the patient, participating in the key portions of the service and reviewing pertinent diagnostic studies and records.  I reviewed the nurse practitioner's note and agree with the findings and the plan.  The assessment and plan were discussed with the patient.  Several questions were asked by the patient and answered.   Nolon Stalls, MD 04/13/2018, 3:00 PM

## 2018-07-17 ENCOUNTER — Ambulatory Visit (INDEPENDENT_AMBULATORY_CARE_PROVIDER_SITE_OTHER): Payer: No Typology Code available for payment source | Admitting: Family Medicine

## 2018-07-17 ENCOUNTER — Encounter: Payer: Self-pay | Admitting: Family Medicine

## 2018-07-17 VITALS — BP 118/74 | HR 90 | Temp 97.9°F | Resp 16 | Ht 65.0 in | Wt 218.0 lb

## 2018-07-17 DIAGNOSIS — R600 Localized edema: Secondary | ICD-10-CM | POA: Diagnosis not present

## 2018-07-17 DIAGNOSIS — E538 Deficiency of other specified B group vitamins: Secondary | ICD-10-CM

## 2018-07-17 DIAGNOSIS — D500 Iron deficiency anemia secondary to blood loss (chronic): Secondary | ICD-10-CM | POA: Diagnosis not present

## 2018-07-17 DIAGNOSIS — F325 Major depressive disorder, single episode, in full remission: Secondary | ICD-10-CM

## 2018-07-17 DIAGNOSIS — E669 Obesity, unspecified: Secondary | ICD-10-CM

## 2018-07-17 MED ORDER — PHENTERMINE-TOPIRAMATE ER 7.5-46 MG PO CP24: 1.0000 | ORAL_CAPSULE | Freq: Every day | ORAL | 2 refills | Status: DC

## 2018-07-17 MED ORDER — CYANOCOBALAMIN 1000 MCG/ML IJ SOLN
1000.0000 ug | Freq: Once | INTRAMUSCULAR | Status: AC
  Administered 2018-07-17: 1000 ug via INTRAMUSCULAR

## 2018-07-17 MED ORDER — FUROSEMIDE 20 MG PO TABS: 20.0000 mg | ORAL_TABLET | Freq: Every day | ORAL | 1 refills | Status: DC

## 2018-07-17 NOTE — Progress Notes (Signed)
Name: Sharon Herring   MRN: [id number]    DOB: May 09, 1976   Date:07/17/2018       Progress Note  Subjective  Chief Complaint  Chief Complaint  Patient presents with  . Edema    Onset-yesterday, bilateral feet-arms and hand.. edema and painful- Foot pain is shooting up to her knee. Has taken 2 lasix 40 mg and didn't help     HPI  Edema: she has a history of lower extremity edema, used to happen during her cycles, however LMP was 06/06/2018. She denies change in activity or salt intake. She took tow lasix yesterday and swelling of hands and arms improved, however lower leg still bothering her. No redness, just swollen and tender to touch. Drinking lots of water since yesterday and voiding without problems.   Iron deficiency anemia: under the care of hematologist and going to have iron infusion next week  Obesity: she was responding to Qsymia however very depressed on her last visit and had lack of appetite, so we stopped medication, she has gained 6 lbs since. We will resume it today since phq 9 has improved, her appetite is back to normal now.   Periosteum pain: on right lower leg, going up to her knee, likely from swelling, elevated leg, take lasix and monitor for now  Patient Active Problem List   Diagnosis Date Noted  . Lymphadenopathy 01/16/2018  . Moderate obstructive sleep apnea 12/19/2017  . Swelling of limb 01/27/2017  . B12 deficiency 03/28/2016  . Vitamin D deficiency 03/28/2016  . Hyperglycemia 03/28/2016  . Multiple gastric polyps 01/08/2016  . Menorrhagia 11/13/2015  . Chronic constipation 09/23/2015  . Obesity, Class I, BMI 30.0-34.9 (see actual BMI) 06/02/2015  . Iron deficiency anemia 03/14/2014  . History of uterine fibroid 03/14/2014    Past Surgical History:  Procedure Laterality Date  . APPENDECTOMY  2012  . ESOPHAGOGASTRODUODENOSCOPY (EGD) WITH PROPOFOL N/A 01/06/2016   Procedure: ESOPHAGOGASTRODUODENOSCOPY (EGD) WITH PROPOFOL;  Surgeon: Robert Bellow,  MD;  Location: [facility] ENDOSCOPY;  Service: Endoscopy;  Laterality: N/A;  . ROBOTIC ASSISTED LAPAROSCOPIC CHOLECYSTECTOMY N/A 03/03/2016   Procedure: ROBOTIC ASSISTED LAPAROSCOPIC CHOLECYSTECTOMY ;  Surgeon: Clayburn Pert, MD;  Location: [facility] ORS;  Service: General;  Laterality: N/A;  . UTERINE FIBROID SURGERY  2012    Family History  Problem Relation Age of Onset  . Hypertension Mother   . Hypertension Father   . Pancreatic cancer Father 62  . Breast cancer Neg Hx   . Colon cancer Neg Hx     Social History   Socioeconomic History  . Marital status: Single    Spouse name: Not on file  . Number of children: 0  . Years of education: Not on file  . Highest education level: Bachelor's degree (e.g., BA, AB, BS)  Occupational History  . Occupation: CMA  Social Needs  . Financial resource strain: Not hard at all  . Food insecurity:    Worry: Never true    Inability: Never true  . Transportation needs:    Medical: No    Non-medical: No  Tobacco Use  . Smoking status: Never Smoker  . Smokeless tobacco: Never Used  Substance and Sexual Activity  . Alcohol use: No    Alcohol/week: 0.0 standard drinks  . Drug use: No  . Sexual activity: Yes    Partners: Male    Birth control/protection: Condom  Lifestyle  . Physical activity:    Days per week: 7 days    Minutes per session:  30 min  . Stress: Not at all  Relationships  . Social connections:    Talks on phone: More than three times a week    Gets together: More than three times a week    Attends religious service: More than 4 times per year    Active member of club or organization: Yes    Attends meetings of clubs or organizations: More than 4 times per year    Relationship status: Never married  . Intimate partner violence:    Fear of current or ex partner: No    Emotionally abused: No    Physically abused: No    Forced sexual activity: No  Other Topics Concern  . Not on file  Social History Narrative   Lives alone,  works for [employer] at the [employer] , but switching to [employer]      Current Outpatient Medications:  .  BENZEPRO FOAMING CLOTHS 6 % MISC, Apply 1 each topically every other day., Disp: , Rfl:  .  cholecalciferol (VITAMIN D) 1000 units tablet, Take 1 tablet (1,000 Units total) by mouth daily., Disp: 30 tablet, Rfl: 0 .  doxylamine, Sleep, (UNISOM) 25 MG tablet, Take 1 tablet (25 mg total) by mouth at bedtime as needed., Disp: 30 tablet, Rfl: 0 .  fluticasone (FLONASE) 50 MCG/ACT nasal spray, Place 2 sprays into both nostrils daily., Disp: 16 g, Rfl: 6 .  furosemide (LASIX) 20 MG tablet, Take 1 tablet (20 mg total) by mouth daily., Disp: 30 tablet, Rfl: 1 .  ibuprofen (ADVIL,MOTRIN) 800 MG tablet, Take 1 tablet (800 mg total) by mouth every 8 (eight) hours as needed., Disp: 60 tablet, Rfl: 3 .  iron polysaccharides (NIFEREX) 150 MG capsule, Take 150 mg by mouth every other day., Disp: , Rfl:  .  linaclotide (LINZESS) 145 MCG CAPS capsule, Take 1 capsule (145 mcg total) by mouth daily before breakfast., Disp: 30 capsule, Rfl: 2 .  Multiple Vitamins-Minerals (MULTIVITAMIN ADULT) TABS, Take 1 tablet by mouth daily., Disp: 30 tablet, Rfl: 0 .  PREVIDENT 5000 BOOSTER PLUS 1.1 % PSTE, , Disp: , Rfl: 2 .  tranexamic acid (LYSTEDA) 650 MG TABS tablet, Take 2 tablets (1,300 mg total) by mouth 3 (three) times daily. Take during menses for a maximum of five days, Disp: 30 tablet, Rfl: 11 .  Phentermine-Topiramate (QSYMIA) 7.5-46 MG CP24, Take 1 tablet by mouth daily., Disp: 30 capsule, Rfl: 2  Current Facility-Administered Medications:  .  cyanocobalamin ((VITAMIN B-12)) injection 1,000 mcg, 1,000 mcg, Intramuscular, Once, Steele Sizer, MD  Allergies  Allergen Reactions  . Aspirin Nausea And Vomiting  . Oxycodone-Acetaminophen Rash and Shortness Of Breath  . Lexapro [Escitalopram] Palpitations  . Tape Other (See Comments)    Burning feeling. Has left mark on skin.  Melynda Keller [Lorcaserin Hcl] Other (See  Comments)    headache  . Contrave [Naltrexone-Bupropion Hcl Er]   . Dicyclomine Nausea And Vomiting  . Norethindrone Rash    I personally reviewed active problem list, medication list, allergies, family history, social history with the patient/caregiver today.   ROS  Constitutional: Negative for fever , positive for mild weight change.  Respiratory: Negative for cough and shortness of breath.   Cardiovascular: Negative for chest pain or palpitations.  Gastrointestinal: Negative for abdominal pain, no bowel changes.  Musculoskeletal: Negative for gait problem or joint swelling.  Skin: Negative for rash.  Neurological: Negative for dizziness or headache.  No other specific complaints in a complete review of systems (except  as listed in HPI above).  Objective  Vitals:   07/17/18 1509  BP: 118/74  Pulse: 90  Resp: 16  Temp: 97.9 F (36.6 C)  TempSrc: Oral  SpO2: 99%  Weight: 218 lb (98.9 kg)  Height: 5\' 5"  (1.651 m)    Body mass index is 36.28 kg/m.  Physical Exam  Constitutional: Patient appears well-developed and well-nourished. Obese No distress.  HEENT: head atraumatic, normocephalic, pupils equal and reactive to light,  neck supple, throat within normal limits Cardiovascular: Normal rate, regular rhythm and normal heart sounds.  No murmur heard. 2 plus  BLE edema. Pulmonary/Chest: Effort normal and breath sounds normal. No respiratory distress. Abdominal: Soft.  There is no tenderness. Psychiatric: Patient has a normal mood and affect. behavior is normal. Judgment and thought content normal.  Recent Results (from the past 2160 hour(s))  H. pylori breath test     Status: None   Collection Time: 05/01/18  3:36 PM  Result Value Ref Range   H. pylori Breath Test NOT DETECTED NOT DETECT    Comment: . Antimicrobials, proton pump inhibitors, and bismuth preparations are known to suppress H. pylori, and  ingestion of these prior to H. pylori diagnostic testing may  lead to false negative results. If clinically  indicated, the test may be repeated on a new specimen obtained two weeks after discontinuing treatment. However, a positive result is still clinically valid.   Sedimentation rate     Status: Abnormal   Collection Time: 07/10/18 12:07 PM  Result Value Ref Range   Sed Rate 35 (H) 0 - 20 mm/hr    Comment: Performed at [facility], [address] [place], [place] [address]  CBC with Differential     Status: Abnormal   Collection Time: 07/10/18 12:07 PM  Result Value Ref Range   WBC 6.2 4.0 - 10.5 K/uL   RBC 4.04 3.87 - 5.11 MIL/uL   Hemoglobin 9.5 (L) 12.0 - 15.0 g/dL    Comment: Reticulocyte Hemoglobin testing may be clinically indicated, consider ordering this additional test [id number]    HCT 31.5 (L) 36.0 - 46.0 %   MCV 78.0 (L) 80.0 - 100.0 fL   MCH 23.5 (L) 26.0 - 34.0 pg   MCHC 30.2 30.0 - 36.0 g/dL   RDW 20.1 (H) 11.5 - 15.5 %   Platelets 480 (H) 150 - 400 K/uL   nRBC 0.0 0.0 - 0.2 %   Neutrophils Relative % 48 %   Neutro Abs 3.1 1.7 - 7.7 K/uL   Lymphocytes Relative 41 %   Lymphs Abs 2.5 0.7 - 4.0 K/uL   Monocytes Relative 8 %   Monocytes Absolute 0.5 0.1 - 1.0 K/uL   Eosinophils Relative 1 %   Eosinophils Absolute 0.1 0.0 - 0.5 K/uL   Basophils Relative 1 %   Basophils Absolute 0.0 0.0 - 0.1 K/uL   Immature Granulocytes 1 %   Abs Immature Granulocytes 0.04 0.00 - 0.07 K/uL    Comment: Performed at [facility], [address] [place], [place] [address]  Ferritin     Status: Abnormal   Collection Time: 07/10/18 12:07 PM  Result Value Ref Range   Ferritin 9 (L) 11 - 307 ng/mL    Comment: Performed at [facility], [address] [place],  [address]     PHQ2/9: Depression screen [facility] 2/9 07/17/2018 03/23/2018 12/14/2017 08/05/2017 04/17/2017  Decreased Interest 0 1 0 0 0  Down, Depressed, Hopeless 0 1 0 1 0  PHQ - 2 Score 0 2 0 1 0  Altered sleeping 1 3 - 2 -  Tired, decreased  energy 1 3 - 1 -  Change in appetite 1 3 - 1 -  Feeling bad or failure about yourself  0 0 - 0 -  Trouble concentrating 0 2 - 1 -  Moving slowly or fidgety/restless 0 0 - 0 -  Suicidal thoughts 0 0 - 0 -  PHQ-9 Score 3 13 - 6 -  Difficult doing work/chores Not difficult at all Very difficult - Not difficult at all -     Fall Risk: Fall Risk  07/17/2018 04/20/2018 03/23/2018 12/14/2017 11/01/2017  Falls in the past year? 0 No No No No  Number falls in past yr: 0 - - - -     Functional Status Survey: Is the patient deaf or have difficulty hearing?: No Does the patient have difficulty seeing, even when wearing glasses/contacts?: Yes(Contacts) Does the patient have difficulty concentrating, remembering, or making decisions?: No Does the patient have difficulty walking or climbing stairs?: No Does the patient have difficulty dressing or bathing?: No Does the patient have difficulty doing errands alone such as visiting a doctor's office or shopping?: No    Assessment & Plan  1. Edema of both legs  - furosemide (LASIX) 20 MG tablet; Take 1 tablet (20 mg total) by mouth daily.  Dispense: 30 tablet; Refill: 1  2. Iron deficiency anemia due to chronic blood loss  Getting infusion next week   3. Moderate major depression in remission [facility])  Doing well now   4. Obesity, Class I, BMI 30.0-34.9 (see actual BMI)  Discussed with the patient the risk posed by an increased BMI. Discussed importance of portion control, calorie counting and at least 150 minutes of physical activity weekly. Avoid sweet beverages and drink more water. Eat at least 6 servings of fruit and vegetables daily   5. B12 deficiency  - cyanocobalamin ((VITAMIN B-12)) injection 1,000 mcg

## 2018-07-27 ENCOUNTER — Other Ambulatory Visit: Payer: Self-pay

## 2018-07-27 ENCOUNTER — Inpatient Hospital Stay: Payer: No Typology Code available for payment source | Attending: Hematology and Oncology

## 2018-07-27 DIAGNOSIS — N920 Excessive and frequent menstruation with regular cycle: Secondary | ICD-10-CM | POA: Insufficient documentation

## 2018-07-27 DIAGNOSIS — D500 Iron deficiency anemia secondary to blood loss (chronic): Secondary | ICD-10-CM

## 2018-07-28 ENCOUNTER — Ambulatory Visit (HOSPITAL_COMMUNITY)
Admission: EM | Admit: 2018-07-28 | Discharge: 2018-07-28 | Disposition: A | Discharge status: Home / Self Care | Payer: No Typology Code available for payment source | Attending: Physician Assistant | Admitting: Physician Assistant

## 2018-07-28 ENCOUNTER — Encounter (HOSPITAL_COMMUNITY): Payer: Self-pay | Admitting: Physician Assistant

## 2018-07-28 ENCOUNTER — Other Ambulatory Visit: Payer: Self-pay

## 2018-07-28 DIAGNOSIS — M7989 Other specified soft tissue disorders: Secondary | ICD-10-CM | POA: Diagnosis not present

## 2018-07-28 MED ORDER — MELOXICAM 7.5 MG PO TABS: 7.5000 mg | ORAL_TABLET | Freq: Every day | ORAL | 0 refills | Status: DC

## 2018-07-28 NOTE — ED Provider Notes (Signed)
[facility]    CSN: [id number] Arrival date & time: 07/28/18  1640     History   Chief Complaint Chief Complaint  Patient presents with  . Leg Swelling    HPI Sharon Herring is a 42 y.o. female.   42 year old female with history of fibroids, GERD, IBS comes in for 1 day history of bilateral foot swelling.  Patient states woke up this morning with swelling and pain to the left foot.  No injury/trauma.  Diffuse swelling without erythema or warmth.  Denies numbness, tingling.  States this happened a few weeks ago, and after some Epson salt soaks and elevation, swelling resolved.  However, tried soaking and elevation without relief.  She denies shortness of breath, chest pain, palpitations.  Denies orthopnea. No long hours of standing.      Past Medical History:  Diagnosis Date  . Anemia   . Dizziness   . Fibroid uterus   . GERD (gastroesophageal reflux disease)   . IBS (irritable bowel syndrome)   . Migraine   . Polyp of stomach 01/06/2016   Stomach polyp, pyloric, consistent with prolapse.     Patient Active Problem List   Diagnosis Date Noted  . Lymphadenopathy 01/16/2018  . Moderate obstructive sleep apnea 12/19/2017  . Swelling of limb 01/27/2017  . B12 deficiency 03/28/2016  . Vitamin D deficiency 03/28/2016  . Hyperglycemia 03/28/2016  . Multiple gastric polyps 01/08/2016  . Menorrhagia 11/13/2015  . Chronic constipation 09/23/2015  . Obesity, Class I, BMI 30.0-34.9 (see actual BMI) 06/02/2015  . Iron deficiency anemia 03/14/2014  . History of uterine fibroid 03/14/2014    Past Surgical History:  Procedure Laterality Date  . APPENDECTOMY  2012  . ESOPHAGOGASTRODUODENOSCOPY (EGD) WITH PROPOFOL N/A 01/06/2016   Procedure: ESOPHAGOGASTRODUODENOSCOPY (EGD) WITH PROPOFOL;  Surgeon: Robert Bellow, MD;  Location: [facility];  Service: Endoscopy;  Laterality: N/A;  . ROBOTIC ASSISTED LAPAROSCOPIC CHOLECYSTECTOMY N/A 03/03/2016   Procedure: ROBOTIC  ASSISTED LAPAROSCOPIC CHOLECYSTECTOMY ;  Surgeon: Clayburn Pert, MD;  Location: [facility] ORS;  Service: General;  Laterality: N/A;  . UTERINE FIBROID SURGERY  2012    OB History    Gravida  0   Para  0   Term  0   Preterm  0   AB  0   Living  0     SAB  0   TAB  0   Ectopic  0   Multiple  0   Live Births           Obstetric Comments  1st Menstrual Cycle:  15            Home Medications    Prior to Admission medications   Medication Sig Start Date End Date Taking? Authorizing Provider  BENZEPRO FOAMING CLOTHS 6 % MISC Apply 1 each topically every other day. 12/15/16   Jannet Mantis, MD  cholecalciferol (VITAMIN D) 1000 units tablet Take 1 tablet (1,000 Units total) by mouth daily. 01/04/17   Steele Sizer, MD  doxylamine, Sleep, (UNISOM) 25 MG tablet Take 1 tablet (25 mg total) by mouth at bedtime as needed. 03/23/18   Steele Sizer, MD  fluticasone (FLONASE) 50 MCG/ACT nasal spray Place 2 sprays into both nostrils daily. 08/21/17   Sharion Balloon, FNP  furosemide (LASIX) 20 MG tablet Take 1 tablet (20 mg total) by mouth daily. 07/17/18   Steele Sizer, MD  ibuprofen (ADVIL,MOTRIN) 800 MG tablet Take 1 tablet (800 mg total) by mouth every 8 (  eight) hours as needed. 05/10/17   Rubie Maid, MD  iron polysaccharides (NIFEREX) 150 MG capsule Take 150 mg by mouth every other day.    [provider]  linaclotide Rolan Lipa) 145 MCG CAPS capsule Take 1 capsule (145 mcg total) by mouth daily before breakfast. 12/16/15   Steele Sizer, MD  meloxicam (MOBIC) 7.5 MG tablet Take 1 tablet (7.5 mg total) by mouth daily. 07/28/18   Tasia Catchings, Jadalee Westcott V, PA-C  Multiple Vitamins-Minerals (MULTIVITAMIN ADULT) TABS Take 1 tablet by mouth daily. 01/04/17   Steele Sizer, MD  Phentermine-Topiramate (QSYMIA) 7.5-46 MG CP24 Take 1 tablet by mouth daily. 07/17/18   Steele Sizer, MD  PREVIDENT 5000 BOOSTER PLUS 1.1 % PSTE  07/13/18   [provider]  tranexamic acid  (LYSTEDA) 650 MG TABS tablet Take 2 tablets (1,300 mg total) by mouth 3 (three) times daily. Take during menses for a maximum of five days 05/10/17   Rubie Maid, MD    Family History Family History  Problem Relation Age of Onset  . Hypertension Mother   . Hypertension Father   . Pancreatic cancer Father 69  . Breast cancer Neg Hx   . Colon cancer Neg Hx     Social History Social History   Tobacco Use  . Smoking status: Never Smoker  . Smokeless tobacco: Never Used  Substance Use Topics  . Alcohol use: No    Alcohol/week: 0.0 standard drinks  . Drug use: No     Allergies   Aspirin; Oxycodone-acetaminophen; Lexapro [escitalopram]; Tape; Belviq [lorcaserin hcl]; Contrave [naltrexone-bupropion hcl er]; Dicyclomine; and Norethindrone   Review of Systems Review of Systems  Reason unable to perform ROS: See HPI as above.     Physical Exam Triage Vital Signs ED Triage Vitals [07/28/18 1710]  Enc Vitals Group     BP 132/86     Pulse Rate 95     Resp 18     Temp 97.9 F (36.6 C)     Temp Source Oral     SpO2 98 %     Weight      Height      Head Circumference      Peak Flow      Pain Score      Pain Loc      Pain Edu?      Excl. in [place]?    No data found.  Updated Vital Signs BP 132/86 (BP Location: Left Arm)   Pulse 95   Temp 97.9 F (36.6 C) (Oral)   Resp 18   LMP 07/05/2018   SpO2 98%   Physical Exam  Constitutional: She is oriented to person, place, and time. She appears well-developed and well-nourished. No distress.  HENT:  Head: Normocephalic and atraumatic.  Eyes: Pupils are equal, round, and reactive to light. Conjunctivae are normal.  Cardiovascular: Normal rate, regular rhythm and normal heart sounds. Exam reveals no gallop and no friction rub.  No murmur heard. Pulmonary/Chest: Effort normal and breath sounds normal. No accessory muscle usage or stridor. No respiratory distress. She has no decreased breath sounds. She has no wheezes. She has  no rhonchi. She has no rales.  Musculoskeletal:  Diffuse foot swelling bilaterally without pitting edema.  No swelling to the lower leg.  No erythema, warmth, contusion.  Diffuse tenderness to palpation of left foot.  Full range of motion of ankle and toes.  Strength normal and equal bilaterally.  Sensation intact and equal bilaterally.  Pedal pulse 2+, cap refill  less than 2 seconds.  Neurological: She is alert and oriented to person, place, and time.  Skin: She is not diaphoretic.     UC Treatments / Results  Labs (all labs ordered are listed, but only abnormal results are displayed) Labs Reviewed - No data to display  EKG None  Radiology No results found.  Procedures Procedures (including critical care time)  Medications Ordered in UC Medications - No data to display  Initial Impression / Assessment and Plan / UC Course  I have reviewed the triage vital signs and the nursing notes.  Pertinent labs & imaging results that were available during my care of the patient were reviewed by me and considered in my medical decision making (see chart for details).    Patient was seen by primary care for similar symptoms in the past, was given Lasix.  She took 20 mg Lasix today without any relief.  Will have patient increase dosage to 40 mg, either 20mg  twice daily, or 40mg  daily.  Discussed elevation, Ace wrap, rest.  Return precautions given.  Otherwise follow-up with PCP for further evaluation and management needed.  Patient expresses understanding and agrees to plan.  Final Clinical Impressions(s) / UC Diagnoses   Final diagnoses:  Foot swelling    ED Prescriptions    Medication Sig Dispense Auth. Provider   meloxicam (MOBIC) 7.5 MG tablet Take 1 tablet (7.5 mg total) by mouth daily. 15 tablet Tobin Chad, [place] 07/28/18 1722

## 2018-07-28 NOTE — ED Triage Notes (Addendum)
Bilateral lower extremity edema  Seen by provider

## 2018-07-28 NOTE — Discharge Instructions (Addendum)
Increase lasix to 20mg  twice a day or 40mg  daily for the next 2-4 days. Leg elevation, ace wrap, rest. Monitor for chest pain, shortness of breath, needing to use more pillows to breath, go to the emergency department for further evaluation needed. Otherwise, follow up with PCP for further evaluation needed.

## 2018-07-30 ENCOUNTER — Other Ambulatory Visit: Payer: Self-pay | Admitting: Hematology and Oncology

## 2018-07-30 ENCOUNTER — Inpatient Hospital Stay: Payer: No Typology Code available for payment source | Admitting: Hematology and Oncology

## 2018-07-30 ENCOUNTER — Inpatient Hospital Stay: Payer: No Typology Code available for payment source

## 2018-07-30 DIAGNOSIS — E538 Deficiency of other specified B group vitamins: Secondary | ICD-10-CM

## 2018-07-30 DIAGNOSIS — D500 Iron deficiency anemia secondary to blood loss (chronic): Secondary | ICD-10-CM

## 2018-07-30 NOTE — Progress Notes (Deleted)
[facility] day:  07/30/2018  Chief Complaint: Sharon Herring is a 42 y.o. female with iron deficiency anemia who is seen for 3 month assessment.  HPI: The patient was last seen in the hematology clinic on 04/13/2018.  At that time, she was very fatigued.  Menses remained heavy.  She had ice pica.  Hemoglobin was 8.3.  MCV was 64.8.  Folate was 10.4.  Ferritin was 4.  She received Feraheme x 2 (04/13/2018 and 04/20/2018).   Labs on 07/10/2018 revealed a hematocrit of 31.5, hemoglobin 9.5, MCV 78.  Ferritin was 9.  During the interim,    Past Medical History:  Diagnosis Date  . Anemia   . Dizziness   . Fibroid uterus   . GERD (gastroesophageal reflux disease)   . IBS (irritable bowel syndrome)   . Migraine   . Polyp of stomach 01/06/2016   Stomach polyp, pyloric, consistent with prolapse.     Past Surgical History:  Procedure Laterality Date  . APPENDECTOMY  2012  . ESOPHAGOGASTRODUODENOSCOPY (EGD) WITH PROPOFOL N/A 01/06/2016   Procedure: ESOPHAGOGASTRODUODENOSCOPY (EGD) WITH PROPOFOL;  Surgeon: Robert Bellow, MD;  Location: [facility];  Service: Endoscopy;  Laterality: N/A;  . ROBOTIC ASSISTED LAPAROSCOPIC CHOLECYSTECTOMY N/A 03/03/2016   Procedure: ROBOTIC ASSISTED LAPAROSCOPIC CHOLECYSTECTOMY ;  Surgeon: Clayburn Pert, MD;  Location: [facility] ORS;  Service: General;  Laterality: N/A;  . UTERINE FIBROID SURGERY  2012    Family History  Problem Relation Age of Onset  . Hypertension Mother   . Hypertension Father   . Pancreatic cancer Father 68  . Breast cancer Neg Hx   . Colon cancer Neg Hx     Social History:  reports that she has never smoked. She has never used smokeless tobacco. She reports that she does not drink alcohol or use drugs.  She denies any exposure to radiation or toxins.  She works at [employer] as a Psychologist, sport and exercise.  She lives in [place].  The patient is alone today.  Allergies:  Allergies   Allergen Reactions  . Aspirin Nausea And Vomiting  . Oxycodone-Acetaminophen Rash and Shortness Of Breath  . Lexapro [Escitalopram] Palpitations  . Tape Other (See Comments)    Burning feeling. Has left mark on skin.  Melynda Keller [Lorcaserin Hcl] Other (See Comments)    headache  . Contrave [Naltrexone-Bupropion Hcl Er]   . Dicyclomine Nausea And Vomiting  . Norethindrone Rash    Current Medications: Current Outpatient Medications  Medication Sig Dispense Refill  . BENZEPRO FOAMING CLOTHS 6 % MISC Apply 1 each topically every other day.    . cholecalciferol (VITAMIN D) 1000 units tablet Take 1 tablet (1,000 Units total) by mouth daily. 30 tablet 0  . doxylamine, Sleep, (UNISOM) 25 MG tablet Take 1 tablet (25 mg total) by mouth at bedtime as needed. 30 tablet 0  . fluticasone (FLONASE) 50 MCG/ACT nasal spray Place 2 sprays into both nostrils daily. 16 g 6  . furosemide (LASIX) 20 MG tablet Take 1 tablet (20 mg total) by mouth daily. 30 tablet 1  . ibuprofen (ADVIL,MOTRIN) 800 MG tablet Take 1 tablet (800 mg total) by mouth every 8 (eight) hours as needed. 60 tablet 3  . iron polysaccharides (NIFEREX) 150 MG capsule Take 150 mg by mouth every other day.    . linaclotide (LINZESS) 145 MCG CAPS capsule Take 1 capsule (145 mcg total) by mouth daily before breakfast. 30 capsule 2  . meloxicam (MOBIC) 7.5  MG tablet Take 1 tablet (7.5 mg total) by mouth daily. 15 tablet 0  . Multiple Vitamins-Minerals (MULTIVITAMIN ADULT) TABS Take 1 tablet by mouth daily. 30 tablet 0  . Phentermine-Topiramate (QSYMIA) 7.5-46 MG CP24 Take 1 tablet by mouth daily. 30 capsule 2  . PREVIDENT 5000 BOOSTER PLUS 1.1 % PSTE   2  . tranexamic acid (LYSTEDA) 650 MG TABS tablet Take 2 tablets (1,300 mg total) by mouth 3 (three) times daily. Take during menses for a maximum of five days 30 tablet 11   No current facility-administered medications for this visit.     Review of Systems:  GENERAL:  Very low energy.  No  fevers, sweats or weight loss. PERFORMANCE STATUS (ECOG):  0-1 HEENT:  No visual changes, runny nose, sore throat, mouth sores or tenderness. Lungs: Shortness of breath with exertion.  No cough.  No hemoptysis. Cardiac:  No chest pain, palpitations, orthopnea, or PND. GI:  No nausea, vomiting, diarrhea, constipation, melena or hematochezia.  Pica. GU:  No urgency, frequency, dysuria, or hematuria.   Menses less heavy. Musculoskeletal:  No back pain.  No joint pain.  No muscle tenderness. Extremities:  No pain or swelling. Skin:  No rashes or skin changes. Neuro:  Restless legs.  No headache, numbness or weakness, balance or coordination issues. Endocrine:  No diabetes, thyroid issues, hot flashes or night sweats. Psych:  No mood changes, depression or anxiety. Pain:  No focal pain. Review of systems:  All other systems reviewed and found to be negative.   Physical Exam: Last menstrual period 07/05/2018. GENERAL:  Well developed, well nourished, woman sitting comfortably in the exam room in no acute distress. MENTAL STATUS:  Alert and oriented to person, place and time. HEAD:  Black hair.  Normocephalic, atraumatic, face symmetric, no Cushingoid features. EYES:  Brown eyes.  Pupils equal round and reactive to light and accomodation.  No conjunctivitis or scleral icterus. ENT:  Oropharynx clear without lesion.  Tongue normal. Mucous membranes moist.  RESPIRATORY:  Clear to auscultation without rales, wheezes or rhonchi. CARDIOVASCULAR:  Regular rate and rhythm without murmur, rub or gallop. ABDOMEN:  Soft, non-tender, with active bowel sounds, and no hepatosplenomegaly.  No masses. SKIN:  No rashes, ulcers or lesions. EXTREMITIES: No edema, no skin discoloration or tenderness.  No palpable cords. LYMPH NODES: No palpable cervical, supraclavicular, axillary or inguinal adenopathy  NEUROLOGICAL: Unremarkable. PSYCH:  Appropriate.    No visits with results within 3 Day(s) from this visit.   Latest known visit with results is:  Appointment on 07/10/2018  Component Date Value Ref Range Status  . Sed Rate 07/10/2018 35* 0 - 20 mm/hr Final   Performed at [facility], [address] [place], [place] [address]  . WBC 07/10/2018 6.2  4.0 - 10.5 K/uL Final  . RBC 07/10/2018 4.04  3.87 - 5.11 MIL/uL Final  . Hemoglobin 07/10/2018 9.5* 12.0 - 15.0 g/dL Final   Comment: Reticulocyte Hemoglobin testing may be clinically indicated, consider ordering this additional test [id number]   . HCT 07/10/2018 31.5* 36.0 - 46.0 % Final  . MCV 07/10/2018 78.0* 80.0 - 100.0 fL Final  . MCH 07/10/2018 23.5* 26.0 - 34.0 pg Final  . MCHC 07/10/2018 30.2  30.0 - 36.0 g/dL Final  . RDW 07/10/2018 20.1* 11.5 - 15.5 % Final  . Platelets 07/10/2018 480* 150 - 400 K/uL Final  . nRBC 07/10/2018 0.0  0.0 - 0.2 % Final  . Neutrophils Relative % 07/10/2018 48  %  Final  . Neutro Abs 07/10/2018 3.1  1.7 - 7.7 K/uL Final  . Lymphocytes Relative 07/10/2018 41  % Final  . Lymphs Abs 07/10/2018 2.5  0.7 - 4.0 K/uL Final  . Monocytes Relative 07/10/2018 8  % Final  . Monocytes Absolute 07/10/2018 0.5  0.1 - 1.0 K/uL Final  . Eosinophils Relative 07/10/2018 1  % Final  . Eosinophils Absolute 07/10/2018 0.1  0.0 - 0.5 K/uL Final  . Basophils Relative 07/10/2018 1  % Final  . Basophils Absolute 07/10/2018 0.0  0.0 - 0.1 K/uL Final  . Immature Granulocytes 07/10/2018 1  % Final  . Abs Immature Granulocytes 07/10/2018 0.04  0.00 - 0.07 K/uL Final   Performed at [facility], [address], [place], [place] [address]  . Ferritin 07/10/2018 9* 11 - 307 ng/mL Final   Performed at [facility], [address] [place], [place] [address]    Assessment:  Sharon Herring is a 42 y.o. female with iron deficiency anemia secondary to heavy menses.  She has a history of on and off anemia for several years (before 02/2014).  Diet is fairly good.  She denies any melena, hematochezia, or hematuria.   She has had ice pica x 2 months.  She has a history of unresponsiveness to oral iron (4 month trial in 2015).  Labs on 07/21/2016 revealed a hematocrit of 30.9, hemoglobin 9.3, MCV 73.4, platelets 675,000, white count 5300 with an Graves of 2650. Creatinine was 0.7.  Liver function tests were normal.  Iron studies included a ferritin of 6, iron saturation 6% and TIBC of 440.  HIV testing was negative.  Labs on 08/25/2016 revealed a hematocrit of 28.3, hemoglobin 9.2, and MCV 69.6.  Ferritin was 5.  She received Feraheme 510 mg IV on 08/29/2016 and 09/05/2016.  Ferritin goal is 100.  Ferritin has been followed:  18 on 12/02/2015, 6 on 07/21/2016, 5 on 08/25/2016, 214 on 09/21/2016, 97 on 10/10/2016, 21 on 12/08/2016, 12 on 02/09/2017, 4 on 03/23/2018, 4 on 04/13/2018, and 9 on 07/10/2018.  She has B12 deficiency.  She received B12 shots x 2-3 months (last 01/04/2017).  B12 was 462 on 07/21/2016 and 434 on 12/14/2017.  Folate was 10.4 on 04/13/2018.  Symptomatically,  she is very fatigued.  Menses remain heavy.  She has ice pica.  Hemoglobin is 8.3.  Ferritin is 4.  Plan:  1.  Labs today:  CBC with diff, ferritin, iron studies, B12, folate, hold tube. 2.  Review labs from 07/10/2018.  Hemoglobin improving.  RBC microcytic.  Ferritin remains low (9).   2.  Iron deficiency anemia:  Etiology felt secondary to heavy menses (somewhat improved).  Discus reinstitution of IV iron.  Feraheme today and in 1 week. 3.  B12 deficiency:  4.  Menorrhagia:   RTC in 3 months for MD assessment, labs (CBC with diff, ferritin, sed rate), and +/- Feraheme.   Lequita Asal, MD  07/30/2018, 7:29 AM   I saw and evaluated the patient, participating in the key portions of the service and reviewing pertinent diagnostic studies and records.  I reviewed the nurse practitioner's note and agree with the findings and the plan.  The assessment and plan were discussed with the patient.  Several questions were  asked by the patient and answered.   Nolon Stalls, MD 04/13/2018, 3:00 PM

## 2018-08-01 ENCOUNTER — Inpatient Hospital Stay: Payer: No Typology Code available for payment source | Attending: Hematology and Oncology

## 2018-08-03 ENCOUNTER — Other Ambulatory Visit: Payer: Self-pay | Admitting: *Deleted

## 2018-08-03 ENCOUNTER — Inpatient Hospital Stay: Payer: No Typology Code available for payment source

## 2018-08-03 ENCOUNTER — Inpatient Hospital Stay: Payer: No Typology Code available for payment source | Admitting: Hematology and Oncology

## 2018-08-03 DIAGNOSIS — D500 Iron deficiency anemia secondary to blood loss (chronic): Secondary | ICD-10-CM

## 2018-08-03 DIAGNOSIS — N920 Excessive and frequent menstruation with regular cycle: Secondary | ICD-10-CM | POA: Diagnosis not present

## 2018-08-03 LAB — CBC WITH DIFFERENTIAL/PLATELET
Abs Immature Granulocytes: 0.04 10*3/uL (ref 0.00–0.07)
Basophils Absolute: 0 10*3/uL (ref 0.0–0.1)
Basophils Relative: 0 %
Eosinophils Absolute: 0.1 10*3/uL (ref 0.0–0.5)
Eosinophils Relative: 2 %
HCT: 29.4 % — ABNORMAL LOW (ref 36.0–46.0)
Hemoglobin: 9 g/dL — ABNORMAL LOW (ref 12.0–15.0)
Immature Granulocytes: 1 %
Lymphocytes Relative: 38 %
Lymphs Abs: 2.1 10*3/uL (ref 0.7–4.0)
MCH: 23.4 pg — ABNORMAL LOW (ref 26.0–34.0)
MCHC: 30.6 g/dL (ref 30.0–36.0)
MCV: 76.4 fL — ABNORMAL LOW (ref 80.0–100.0)
Monocytes Absolute: 0.5 10*3/uL (ref 0.1–1.0)
Monocytes Relative: 9 %
Neutro Abs: 2.9 10*3/uL (ref 1.7–7.7)
Neutrophils Relative %: 50 %
Platelets: 465 10*3/uL — ABNORMAL HIGH (ref 150–400)
RBC: 3.85 MIL/uL — ABNORMAL LOW (ref 3.87–5.11)
RDW: 17 % — ABNORMAL HIGH (ref 11.5–15.5)
WBC: 5.6 10*3/uL (ref 4.0–10.5)
nRBC: 0 % (ref 0.0–0.2)

## 2018-08-03 LAB — FERRITIN: Ferritin: 6 ng/mL — ABNORMAL LOW (ref 11–307)

## 2018-08-03 LAB — IRON AND TIBC
Iron: 25 ug/dL — ABNORMAL LOW (ref 28–170)
Saturation Ratios: 6 % — ABNORMAL LOW (ref 10.4–31.8)
TIBC: 423 ug/dL (ref 250–450)
UIBC: 398 ug/dL

## 2018-08-03 NOTE — Progress Notes (Deleted)
[facility] day:  08/03/2018  Chief Complaint: Sharon Herring is a 42 y.o. female with iron deficiency anemia who is seen for 3 month assessment.  HPI: The patient was last seen in the hematology clinic on 04/13/2018.  At that time, she was very fatigued.  Menses remained heavy.  She had ice pica.  Hemoglobin was 8.3.  MCV was 64.8.  Folate was 10.4.  Ferritin was 4.  She received Feraheme x 2 (04/13/2018 and 04/20/2018).   Labs on 07/10/2018 revealed a hematocrit of 31.5, hemoglobin 9.5, MCV 78.  Ferritin was 9.  She was seen in the [facility] ER on 07/28/2018 for lower extremity swelling.  Notes reviewed.  She had bilateral foot swelling.  Her Lasix was increased.  During the interim,    Past Medical History:  Diagnosis Date  . Anemia   . Dizziness   . Fibroid uterus   . GERD (gastroesophageal reflux disease)   . IBS (irritable bowel syndrome)   . Migraine   . Polyp of stomach 01/06/2016   Stomach polyp, pyloric, consistent with prolapse.     Past Surgical History:  Procedure Laterality Date  . APPENDECTOMY  2012  . ESOPHAGOGASTRODUODENOSCOPY (EGD) WITH PROPOFOL N/A 01/06/2016   Procedure: ESOPHAGOGASTRODUODENOSCOPY (EGD) WITH PROPOFOL;  Surgeon: Robert Bellow, MD;  Location: [facility] ENDOSCOPY;  Service: Endoscopy;  Laterality: N/A;  . ROBOTIC ASSISTED LAPAROSCOPIC CHOLECYSTECTOMY N/A 03/03/2016   Procedure: ROBOTIC ASSISTED LAPAROSCOPIC CHOLECYSTECTOMY ;  Surgeon: Clayburn Pert, MD;  Location: [facility] ORS;  Service: General;  Laterality: N/A;  . UTERINE FIBROID SURGERY  2012    Family History  Problem Relation Age of Onset  . Hypertension Mother   . Hypertension Father   . Pancreatic cancer Father 25  . Breast cancer Neg Hx   . Colon cancer Neg Hx     Social History:  reports that she has never smoked. She has never used smokeless tobacco. She reports that she does not drink alcohol or use drugs.  She denies any exposure to  radiation or toxins.  She works at [employer] as a Psychologist, sport and exercise.  She lives in [place].  The patient is alone today.  Allergies:  Allergies  Allergen Reactions  . Aspirin Nausea And Vomiting  . Oxycodone-Acetaminophen Rash and Shortness Of Breath  . Lexapro [Escitalopram] Palpitations  . Tape Other (See Comments)    Burning feeling. Has left mark on skin.  Melynda Keller [Lorcaserin Hcl] Other (See Comments)    headache  . Contrave [Naltrexone-Bupropion Hcl Er]   . Dicyclomine Nausea And Vomiting  . Norethindrone Rash    Current Medications: Current Outpatient Medications  Medication Sig Dispense Refill  . BENZEPRO FOAMING CLOTHS 6 % MISC Apply 1 each topically every other day.    . cholecalciferol (VITAMIN D) 1000 units tablet Take 1 tablet (1,000 Units total) by mouth daily. 30 tablet 0  . doxylamine, Sleep, (UNISOM) 25 MG tablet Take 1 tablet (25 mg total) by mouth at bedtime as needed. 30 tablet 0  . fluticasone (FLONASE) 50 MCG/ACT nasal spray Place 2 sprays into both nostrils daily. 16 g 6  . furosemide (LASIX) 20 MG tablet Take 1 tablet (20 mg total) by mouth daily. 30 tablet 1  . ibuprofen (ADVIL,MOTRIN) 800 MG tablet Take 1 tablet (800 mg total) by mouth every 8 (eight) hours as needed. 60 tablet 3  . iron polysaccharides (NIFEREX) 150 MG capsule Take 150 mg by mouth every other day.    Marland Kitchen  linaclotide (LINZESS) 145 MCG CAPS capsule Take 1 capsule (145 mcg total) by mouth daily before breakfast. 30 capsule 2  . meloxicam (MOBIC) 7.5 MG tablet Take 1 tablet (7.5 mg total) by mouth daily. 15 tablet 0  . Multiple Vitamins-Minerals (MULTIVITAMIN ADULT) TABS Take 1 tablet by mouth daily. 30 tablet 0  . Phentermine-Topiramate (QSYMIA) 7.5-46 MG CP24 Take 1 tablet by mouth daily. 30 capsule 2  . PREVIDENT 5000 BOOSTER PLUS 1.1 % PSTE   2  . tranexamic acid (LYSTEDA) 650 MG TABS tablet Take 2 tablets (1,300 mg total) by mouth 3 (three) times daily. Take during menses for a maximum  of five days 30 tablet 11   No current facility-administered medications for this visit.     Review of Systems:  GENERAL:  Very low energy.  No fevers, sweats or weight loss. PERFORMANCE STATUS (ECOG):  0-1 HEENT:  No visual changes, runny nose, sore throat, mouth sores or tenderness. Lungs: Shortness of breath with exertion.  No cough.  No hemoptysis. Cardiac:  No chest pain, palpitations, orthopnea, or PND. GI:  No nausea, vomiting, diarrhea, constipation, melena or hematochezia.  Pica. GU:  No urgency, frequency, dysuria, or hematuria.   Menses less heavy. Musculoskeletal:  No back pain.  No joint pain.  No muscle tenderness. Extremities:  No pain or swelling. Skin:  No rashes or skin changes. Neuro:  Restless legs.  No headache, numbness or weakness, balance or coordination issues. Endocrine:  No diabetes, thyroid issues, hot flashes or night sweats. Psych:  No mood changes, depression or anxiety. Pain:  No focal pain. Review of systems:  All other systems reviewed and found to be negative.   Physical Exam: Last menstrual period 07/05/2018. GENERAL:  Well developed, well nourished, woman sitting comfortably in the exam room in no acute distress. MENTAL STATUS:  Alert and oriented to person, place and time. HEAD:  Black hair.  Normocephalic, atraumatic, face symmetric, no Cushingoid features. EYES:  Brown eyes.  Pupils equal round and reactive to light and accomodation.  No conjunctivitis or scleral icterus. ENT:  Oropharynx clear without lesion.  Tongue normal. Mucous membranes moist.  RESPIRATORY:  Clear to auscultation without rales, wheezes or rhonchi. CARDIOVASCULAR:  Regular rate and rhythm without murmur, rub or gallop. ABDOMEN:  Soft, non-tender, with active bowel sounds, and no hepatosplenomegaly.  No masses. SKIN:  No rashes, ulcers or lesions. EXTREMITIES: No edema, no skin discoloration or tenderness.  No palpable cords. LYMPH NODES: No palpable cervical,  supraclavicular, axillary or inguinal adenopathy  NEUROLOGICAL: Unremarkable. PSYCH:  Appropriate.    No visits with results within 3 Day(s) from this visit.  Latest known visit with results is:  Appointment on 07/10/2018  Component Date Value Ref Range Status  . Sed Rate 07/10/2018 35* 0 - 20 mm/hr Final   Performed at [facility], [address] [place], [place] [address]  . WBC 07/10/2018 6.2  4.0 - 10.5 K/uL Final  . RBC 07/10/2018 4.04  3.87 - 5.11 MIL/uL Final  . Hemoglobin 07/10/2018 9.5* 12.0 - 15.0 g/dL Final   Comment: Reticulocyte Hemoglobin testing may be clinically indicated, consider ordering this additional test [id number]   . HCT 07/10/2018 31.5* 36.0 - 46.0 % Final  . MCV 07/10/2018 78.0* 80.0 - 100.0 fL Final  . MCH 07/10/2018 23.5* 26.0 - 34.0 pg Final  . MCHC 07/10/2018 30.2  30.0 - 36.0 g/dL Final  . RDW 07/10/2018 20.1* 11.5 - 15.5 % Final  . Platelets 07/10/2018 480*  150 - 400 K/uL Final  . nRBC 07/10/2018 0.0  0.0 - 0.2 % Final  . Neutrophils Relative % 07/10/2018 48  % Final  . Neutro Abs 07/10/2018 3.1  1.7 - 7.7 K/uL Final  . Lymphocytes Relative 07/10/2018 41  % Final  . Lymphs Abs 07/10/2018 2.5  0.7 - 4.0 K/uL Final  . Monocytes Relative 07/10/2018 8  % Final  . Monocytes Absolute 07/10/2018 0.5  0.1 - 1.0 K/uL Final  . Eosinophils Relative 07/10/2018 1  % Final  . Eosinophils Absolute 07/10/2018 0.1  0.0 - 0.5 K/uL Final  . Basophils Relative 07/10/2018 1  % Final  . Basophils Absolute 07/10/2018 0.0  0.0 - 0.1 K/uL Final  . Immature Granulocytes 07/10/2018 1  % Final  . Abs Immature Granulocytes 07/10/2018 0.04  0.00 - 0.07 K/uL Final   Performed at [facility], [address], [place], [place] [address]  . Ferritin 07/10/2018 9* 11 - 307 ng/mL Final   Performed at [facility], [address] [place], [place] [address]    Assessment:  Sharon Herring is a 42 y.o. female with iron deficiency anemia secondary to  heavy menses.  She has a history of on and off anemia for several years (before 02/2014).  Diet is fairly good.  She denies any melena, hematochezia, or hematuria.  She has had ice pica x 2 months.  She has a history of unresponsiveness to oral iron (4 month trial in 2015).  Labs on 07/21/2016 revealed a hematocrit of 30.9, hemoglobin 9.3, MCV 73.4, platelets 675,000, white count 5300 with an [place] of 2650. Creatinine was 0.7.  Liver function tests were normal.  Iron studies included a ferritin of 6, iron saturation 6% and TIBC of 440.  HIV testing was negative.  Labs on 08/25/2016 revealed a hematocrit of 28.3, hemoglobin 9.2, and MCV 69.6.  Ferritin was 5.  She received Feraheme 510 mg IV on 08/29/2016 and 09/05/2016.  Ferritin goal is 100.  Ferritin has been followed:  18 on 12/02/2015, 6 on 07/21/2016, 5 on 08/25/2016, 214 on 09/21/2016, 97 on 10/10/2016, 21 on 12/08/2016, 12 on 02/09/2017, 4 on 03/23/2018, 4 on 04/13/2018, and 9 on 07/10/2018.  She has B12 deficiency.  She received B12 shots x 2-3 months (last 01/04/2017).  B12 was 462 on 07/21/2016 and 434 on 12/14/2017.  Folate was 10.4 on 04/13/2018.  Symptomatically,  she is very fatigued.  Menses remain heavy.  She has ice pica.  Hemoglobin is 8.3.  Ferritin is 4.  Plan:  1.  Labs today:  CBC with diff, ferritin, iron studies, B12, folate, hold tube. 2.  Review labs from 07/10/2018.  Hemoglobin improving.  RBC microcytic.  Ferritin remains low (9).   2.  Iron deficiency anemia:  Etiology felt secondary to heavy menses (somewhat improved).  Discus reinstitution of IV iron.  Feraheme today and in 1 week. 3.  B12 deficiency:  4.  Menorrhagia:   RTC in 3 months for MD assessment, labs (CBC with diff, ferritin, sed rate), and +/- Feraheme.   Lequita Asal, MD  08/03/2018, 5:11 AM   I saw and evaluated the patient, participating in the key portions of the service and reviewing pertinent diagnostic studies and records.  I  reviewed the nurse practitioner's note and agree with the findings and the plan.  The assessment and plan were discussed with the patient.  Several questions were asked by the patient and answered.   Nolon Stalls, MD 08/03/2018,  5:11 AM

## 2018-08-07 ENCOUNTER — Ambulatory Visit (INDEPENDENT_AMBULATORY_CARE_PROVIDER_SITE_OTHER): Payer: No Typology Code available for payment source | Admitting: Nurse Practitioner

## 2018-08-07 ENCOUNTER — Encounter: Payer: Self-pay | Admitting: Nurse Practitioner

## 2018-08-07 VITALS — BP 116/68 | HR 86 | Temp 98.8°F | Resp 16 | Ht 65.0 in | Wt 222.2 lb

## 2018-08-07 DIAGNOSIS — R52 Pain, unspecified: Secondary | ICD-10-CM

## 2018-08-07 DIAGNOSIS — R112 Nausea with vomiting, unspecified: Secondary | ICD-10-CM

## 2018-08-07 DIAGNOSIS — R059 Cough, unspecified: Secondary | ICD-10-CM

## 2018-08-07 DIAGNOSIS — R6883 Chills (without fever): Secondary | ICD-10-CM

## 2018-08-07 DIAGNOSIS — R197 Diarrhea, unspecified: Secondary | ICD-10-CM | POA: Diagnosis not present

## 2018-08-07 DIAGNOSIS — R05 Cough: Secondary | ICD-10-CM

## 2018-08-07 LAB — POCT INFLUENZA A/B
Influenza A, POC: NEGATIVE
Influenza B, POC: NEGATIVE

## 2018-08-07 MED ORDER — PROMETHAZINE-DM 6.25-15 MG/5ML PO SYRP: 2.5000 mL | ORAL_SOLUTION | Freq: Four times a day (QID) | ORAL | 0 refills | Status: DC | PRN

## 2018-08-07 NOTE — Patient Instructions (Addendum)
-   Alternate between tylenol (500mg  every 6 hours)  and ibuprofen (400-600mg  every 8 hours) for at least the first 24 hours and then as needed.  -  Take zofran PRN - Phenergan-DM syrup for nausea and cough  - Honey lemon tea for sore throat.  - Rest, and lots of water and sips of electrolyte replacement.   - If you develop shortness of breath, fevers uncontrolled with tylenol and ibuprofen, chest pain or other new or concerning symptoms please get urgent medical attention.

## 2018-08-07 NOTE — Progress Notes (Signed)
Name: Sharon Herring   MRN: [id number]    DOB: 1975/10/25   Date:08/07/2018       Progress Note  Subjective  Chief Complaint  Chief Complaint  Patient presents with  . GI Problem    patient presents with GI upset since Saturday.   . Nausea  . Emesis  . Diarrhea  . Fever    yesterday 102  . Chills  . Cough  . Sore Throat    HPI  Patient endorses fevers, chills, nausea, vomiting, diarrhea, cough- dry, sore throat started on Saturday; worse today.  Diarrhea- three episodes yesterday, once today.  No blood Emesis- three episodes yesterday, once today. Yellow, clear.   Patient has been taking tylenol- fever of 102 Sunday, nyquil, and zofran and theraflu.    Patient Active Problem List   Diagnosis Date Noted  . Lymphadenopathy 01/16/2018  . Moderate obstructive sleep apnea 12/19/2017  . Swelling of limb 01/27/2017  . B12 deficiency 03/28/2016  . Vitamin D deficiency 03/28/2016  . Hyperglycemia 03/28/2016  . Multiple gastric polyps 01/08/2016  . Menorrhagia 11/13/2015  . Chronic constipation 09/23/2015  . Obesity, Class I, BMI 30.0-34.9 (see actual BMI) 06/02/2015  . Iron deficiency anemia 03/14/2014  . History of uterine fibroid 03/14/2014    Past Medical History:  Diagnosis Date  . Anemia   . Dizziness   . Fibroid uterus   . GERD (gastroesophageal reflux disease)   . IBS (irritable bowel syndrome)   . Migraine   . Polyp of stomach 01/06/2016   Stomach polyp, pyloric, consistent with prolapse.     Past Surgical History:  Procedure Laterality Date  . APPENDECTOMY  2012  . ESOPHAGOGASTRODUODENOSCOPY (EGD) WITH PROPOFOL N/A 01/06/2016   Procedure: ESOPHAGOGASTRODUODENOSCOPY (EGD) WITH PROPOFOL;  Surgeon: Robert Bellow, MD;  Location: [facility] ENDOSCOPY;  Service: Endoscopy;  Laterality: N/A;  . ROBOTIC ASSISTED LAPAROSCOPIC CHOLECYSTECTOMY N/A 03/03/2016   Procedure: ROBOTIC ASSISTED LAPAROSCOPIC CHOLECYSTECTOMY ;  Surgeon: Clayburn Pert, MD;  Location: [facility] ORS;   Service: General;  Laterality: N/A;  . UTERINE FIBROID SURGERY  2012    Social History   Tobacco Use  . Smoking status: Never Smoker  . Smokeless tobacco: Never Used  Substance Use Topics  . Alcohol use: No    Alcohol/week: 0.0 standard drinks     Current Outpatient Medications:  .  BENZEPRO FOAMING CLOTHS 6 % MISC, Apply 1 each topically every other day., Disp: , Rfl:  .  cholecalciferol (VITAMIN D) 1000 units tablet, Take 1 tablet (1,000 Units total) by mouth daily., Disp: 30 tablet, Rfl: 0 .  doxylamine, Sleep, (UNISOM) 25 MG tablet, Take 1 tablet (25 mg total) by mouth at bedtime as needed., Disp: 30 tablet, Rfl: 0 .  fluticasone (FLONASE) 50 MCG/ACT nasal spray, Place 2 sprays into both nostrils daily., Disp: 16 g, Rfl: 6 .  furosemide (LASIX) 20 MG tablet, Take 1 tablet (20 mg total) by mouth daily., Disp: 30 tablet, Rfl: 1 .  ibuprofen (ADVIL,MOTRIN) 800 MG tablet, Take 1 tablet (800 mg total) by mouth every 8 (eight) hours as needed., Disp: 60 tablet, Rfl: 3 .  iron polysaccharides (NIFEREX) 150 MG capsule, Take 150 mg by mouth every other day., Disp: , Rfl:  .  linaclotide (LINZESS) 145 MCG CAPS capsule, Take 1 capsule (145 mcg total) by mouth daily before breakfast., Disp: 30 capsule, Rfl: 2 .  meloxicam (MOBIC) 7.5 MG tablet, Take 1 tablet (7.5 mg total) by mouth daily., Disp: 15 tablet, Rfl: 0 .  Multiple Vitamins-Minerals (MULTIVITAMIN ADULT) TABS, Take 1 tablet by mouth daily., Disp: 30 tablet, Rfl: 0 .  Phentermine-Topiramate (QSYMIA) 7.5-46 MG CP24, Take 1 tablet by mouth daily., Disp: 30 capsule, Rfl: 2 .  PREVIDENT 5000 BOOSTER PLUS 1.1 % PSTE, , Disp: , Rfl: 2 .  tranexamic acid (LYSTEDA) 650 MG TABS tablet, Take 2 tablets (1,300 mg total) by mouth 3 (three) times daily. Take during menses for a maximum of five days, Disp: 30 tablet, Rfl: 11  Allergies  Allergen Reactions  . Aspirin Nausea And Vomiting  . Oxycodone-Acetaminophen Rash and Shortness Of Breath  .  Lexapro [Escitalopram] Palpitations  . Tape Other (See Comments)    Burning feeling. Has left mark on skin.  Melynda Keller [Lorcaserin Hcl] Other (See Comments)    headache  . Contrave [Naltrexone-Bupropion Hcl Er]   . Dicyclomine Nausea And Vomiting  . Norethindrone Rash    ROS   No other specific complaints in a complete review of systems (except as listed in HPI above).  Objective  Vitals:   08/07/18 1100  BP: 116/68  Pulse: 86  Resp: 16  Temp: 98.8 F (37.1 C)  TempSrc: Oral  SpO2: 99%  Weight: 222 lb 3.2 oz (100.8 kg)  Height: 5\' 5"  (1.651 m)    Body mass index is 36.98 kg/m.  Nursing Note and Vital Signs reviewed.  Physical Exam Constitutional:      General: She is not in acute distress.    Appearance: She is well-developed. She is ill-appearing. She is not toxic-appearing or diaphoretic.  HENT:     Head: Normocephalic and atraumatic.     Right Ear: Tympanic membrane and ear canal normal.     Left Ear: Tympanic membrane and ear canal normal.     Nose: Rhinorrhea present.     Mouth/Throat:     Mouth: Mucous membranes are dry.     Pharynx: Posterior oropharyngeal erythema present. No pharyngeal swelling or oropharyngeal exudate.     Tonsils: No tonsillar exudate.  Eyes:     Conjunctiva/sclera: Conjunctivae normal.     Pupils: Pupils are equal, round, and reactive to light.  Neck:     Musculoskeletal: Normal range of motion and neck supple.  Cardiovascular:     Rate and Rhythm: Normal rate and regular rhythm.  Pulmonary:     Effort: Pulmonary effort is normal. No respiratory distress.     Breath sounds: Normal breath sounds. No wheezing or rhonchi.  Skin:    General: Skin is warm and dry.     Findings: No rash.  Neurological:     General: No focal deficit present.     Mental Status: She is alert and oriented to person, place, and time.  Psychiatric:        Mood and Affect: Mood normal.        Behavior: Behavior normal.      No results found for  this or any previous visit (from the past 48 hour(s)).  Assessment & Plan  1. Body aches Negative flu swab but likely influenza like illness discussed disease course and management- see AVS.  - POCT Influenza A/B  2. Chills - POCT Influenza A/B  3. Nausea and vomiting, intractability of vomiting not specified, unspecified vomiting type - POCT Influenza A/B - promethazine-dextromethorphan (PROMETHAZINE-DM) 6.25-15 MG/5ML syrup; Take 2.5 mLs by mouth 4 (four) times daily as needed for cough.  Dispense: 118 mL; Refill: 0  4. Diarrhea, unspecified type Improving- no need for antidiarrheals at this time;  discussed symptoms of electrolyte balances consider blood work if unimproved or worsening.  - POCT Influenza A/B  5. Cough - promethazine-dextromethorphan (PROMETHAZINE-DM) 6.25-15 MG/5ML syrup; Take 2.5 mLs by mouth 4 (four) times daily as needed for cough.  Dispense: 118 mL; Refill: 0

## 2018-08-10 ENCOUNTER — Inpatient Hospital Stay: Payer: No Typology Code available for payment source

## 2018-08-10 ENCOUNTER — Inpatient Hospital Stay: Payer: No Typology Code available for payment source | Admitting: Hematology and Oncology

## 2018-08-10 NOTE — Progress Notes (Deleted)
[facility] day:  08/10/2018  Chief Complaint: Sharon Herring is a 42 y.o. female with iron deficiency anemia who is seen for 4 month assessment.  HPI: The patient was last seen in the hematology clinic on 04/13/2018.  At that time, she was very fatigued.  Menses remained heavy.  She had ice pica.  Hemoglobin was 8.3.  MCV was 64.8.  Folate was 10.4.  Ferritin was 4.  She received Feraheme x 2 (04/13/2018 and 04/20/2018).   Labs on 07/10/2018 revealed a hematocrit of 31.5, hemoglobin 9.5, MCV 78.  Ferritin was 9.  She was seen in the [facility] ER on 07/28/2018 for lower extremity swelling.  Notes reviewed.  She had bilateral foot swelling.  Her Lasix was increased.  CBC on 08/03/2018 revealed a hematocrit of 29.4, hemoglobin 9.0, MCV 76.4, platelets 465,000, WBC 5600 with an ANC of 2900.  Ferritin was 6.  Iron saturation was 6% with a TIBC of 423.  She has missed several appointments.  During the interim,    Past Medical History:  Diagnosis Date  . Anemia   . Dizziness   . Fibroid uterus   . GERD (gastroesophageal reflux disease)   . IBS (irritable bowel syndrome)   . Migraine   . Polyp of stomach 01/06/2016   Stomach polyp, pyloric, consistent with prolapse.     Past Surgical History:  Procedure Laterality Date  . APPENDECTOMY  2012  . ESOPHAGOGASTRODUODENOSCOPY (EGD) WITH PROPOFOL N/A 01/06/2016   Procedure: ESOPHAGOGASTRODUODENOSCOPY (EGD) WITH PROPOFOL;  Surgeon: Robert Bellow, MD;  Location: [facility] ENDOSCOPY;  Service: Endoscopy;  Laterality: N/A;  . ROBOTIC ASSISTED LAPAROSCOPIC CHOLECYSTECTOMY N/A 03/03/2016   Procedure: ROBOTIC ASSISTED LAPAROSCOPIC CHOLECYSTECTOMY ;  Surgeon: Clayburn Pert, MD;  Location: [facility] ORS;  Service: General;  Laterality: N/A;  . UTERINE FIBROID SURGERY  2012    Family History  Problem Relation Age of Onset  . Hypertension Mother   . Hypertension Father   . Pancreatic cancer Father 68  .  Breast cancer Neg Hx   . Colon cancer Neg Hx     Social History:  reports that she has never smoked. She has never used smokeless tobacco. She reports that she does not drink alcohol or use drugs.  She denies any exposure to radiation or toxins.  She works at [employer] as a Psychologist, sport and exercise.  She lives in [place].  The patient is alone today.  Allergies:  Allergies  Allergen Reactions  . Aspirin Nausea And Vomiting  . Oxycodone-Acetaminophen Rash and Shortness Of Breath  . Lexapro [Escitalopram] Palpitations  . Tape Other (See Comments)    Burning feeling. Has left mark on skin.  Melynda Keller [Lorcaserin Hcl] Other (See Comments)    headache  . Contrave [Naltrexone-Bupropion Hcl Er]   . Dicyclomine Nausea And Vomiting  . Norethindrone Rash    Current Medications: Current Outpatient Medications  Medication Sig Dispense Refill  . BENZEPRO FOAMING CLOTHS 6 % MISC Apply 1 each topically every other day.    . cholecalciferol (VITAMIN D) 1000 units tablet Take 1 tablet (1,000 Units total) by mouth daily. 30 tablet 0  . doxylamine, Sleep, (UNISOM) 25 MG tablet Take 1 tablet (25 mg total) by mouth at bedtime as needed. 30 tablet 0  . fluticasone (FLONASE) 50 MCG/ACT nasal spray Place 2 sprays into both nostrils daily. 16 g 6  . furosemide (LASIX) 20 MG tablet Take 1 tablet (20 mg total) by mouth daily. 30 tablet  1  . ibuprofen (ADVIL,MOTRIN) 800 MG tablet Take 1 tablet (800 mg total) by mouth every 8 (eight) hours as needed. 60 tablet 3  . iron polysaccharides (NIFEREX) 150 MG capsule Take 150 mg by mouth every other day.    . linaclotide (LINZESS) 145 MCG CAPS capsule Take 1 capsule (145 mcg total) by mouth daily before breakfast. 30 capsule 2  . meloxicam (MOBIC) 7.5 MG tablet Take 1 tablet (7.5 mg total) by mouth daily. 15 tablet 0  . Multiple Vitamins-Minerals (MULTIVITAMIN ADULT) TABS Take 1 tablet by mouth daily. 30 tablet 0  . Phentermine-Topiramate (QSYMIA) 7.5-46 MG CP24 Take 1  tablet by mouth daily. 30 capsule 2  . PREVIDENT 5000 BOOSTER PLUS 1.1 % PSTE   2  . promethazine-dextromethorphan (PROMETHAZINE-DM) 6.25-15 MG/5ML syrup Take 2.5 mLs by mouth 4 (four) times daily as needed for cough. 118 mL 0  . tranexamic acid (LYSTEDA) 650 MG TABS tablet Take 2 tablets (1,300 mg total) by mouth 3 (three) times daily. Take during menses for a maximum of five days 30 tablet 11   No current facility-administered medications for this visit.     Review of Systems:  GENERAL:  Very low energy.  No fevers, sweats or weight loss. PERFORMANCE STATUS (ECOG):  0-1 HEENT:  No visual changes, runny nose, sore throat, mouth sores or tenderness. Lungs: Shortness of breath with exertion.  No cough.  No hemoptysis. Cardiac:  No chest pain, palpitations, orthopnea, or PND. GI:  No nausea, vomiting, diarrhea, constipation, melena or hematochezia.  Pica. GU:  No urgency, frequency, dysuria, or hematuria.   Menses less heavy. Musculoskeletal:  No back pain.  No joint pain.  No muscle tenderness. Extremities:  No pain or swelling. Skin:  No rashes or skin changes. Neuro:  Restless legs.  No headache, numbness or weakness, balance or coordination issues. Endocrine:  No diabetes, thyroid issues, hot flashes or night sweats. Psych:  No mood changes, depression or anxiety. Pain:  No focal pain. Review of systems:  All other systems reviewed and found to be negative.   Physical Exam: Last menstrual period 08/05/2018. GENERAL:  Well developed, well nourished, woman sitting comfortably in the exam room in no acute distress. MENTAL STATUS:  Alert and oriented to person, place and time. HEAD:  Black hair.  Normocephalic, atraumatic, face symmetric, no Cushingoid features. EYES:  Brown eyes.  Pupils equal round and reactive to light and accomodation.  No conjunctivitis or scleral icterus. ENT:  Oropharynx clear without lesion.  Tongue normal. Mucous membranes moist.  RESPIRATORY:  Clear to  auscultation without rales, wheezes or rhonchi. CARDIOVASCULAR:  Regular rate and rhythm without murmur, rub or gallop. ABDOMEN:  Soft, non-tender, with active bowel sounds, and no hepatosplenomegaly.  No masses. SKIN:  No rashes, ulcers or lesions. EXTREMITIES: No edema, no skin discoloration or tenderness.  No palpable cords. LYMPH NODES: No palpable cervical, supraclavicular, axillary or inguinal adenopathy  NEUROLOGICAL: Unremarkable. PSYCH:  Appropriate.    No visits with results within 3 Day(s) from this visit.  Latest known visit with results is:  Office Visit on 08/07/2018  Component Date Value Ref Range Status  . Influenza A, POC 08/07/2018 Negative  Negative Final  . Influenza B, POC 08/07/2018 Negative  Negative Final    Assessment:  Sharon Herring is a 42 y.o. female with iron deficiency anemia secondary to heavy menses.  She has a history of on and off anemia for several years (before 02/2014).  Diet is fairly good.  She denies any melena, hematochezia, or hematuria.  She has had ice pica x 2 months.  She has a history of unresponsiveness to oral iron (4 month trial in 2015).  Labs on 07/21/2016 revealed a hematocrit of 30.9, hemoglobin 9.3, MCV 73.4, platelets 675,000, white count 5300 with an [place] of 2650. Creatinine was 0.7.  Liver function tests were normal.  Iron studies included a ferritin of 6, iron saturation 6% and TIBC of 440.  HIV testing was negative.  Labs on 08/25/2016 revealed a hematocrit of 28.3, hemoglobin 9.2, and MCV 69.6.  Ferritin was 5.  She received Feraheme 510 mg IV on 08/29/2016 and 09/05/2016.  Ferritin goal is 100.  Ferritin has been followed:  18 on 12/02/2015, 6 on 07/21/2016, 5 on 08/25/2016, 214 on 09/21/2016, 97 on 10/10/2016, 21 on 12/08/2016, 12 on 02/09/2017, 4 on 03/23/2018, 4 on 04/13/2018, 9 on 07/10/2018, and 6 on 08/03/2018.  She has B12 deficiency.  She received B12 shots x 2-3 months (last 2: 01/04/2017 and 07/17/2018).  B12 was  462 on 07/21/2016 and 434 on 12/14/2017.  Folate was 10.4 on 04/13/2018.  Symptomatically,  she is very fatigued.  Menses remain heavy.  She has ice pica.  Hemoglobin is 8.3.  Ferritin is 4.  Plan:  1.  Review labs from 07/10/2018 and 08/03/2018.    2.  Iron deficiency anemia:  Etiology felt secondary to heavy menses (somewhat improved).  Discus reinstitution of IV iron.  Feraheme today and in 1 week. 3.  B12 deficiency:  4.  Menorrhagia:   RTC in 3 months for MD assessment, labs (CBC with diff, ferritin, sed rate), and +/- Feraheme.   Lequita Asal, MD  08/10/2018, 3:36 AM   I saw and evaluated the patient, participating in the key portions of the service and reviewing pertinent diagnostic studies and records.  I reviewed the nurse practitioner's note and agree with the findings and the plan.  The assessment and plan were discussed with the patient.  Several questions were asked by the patient and answered.   Nolon Stalls, MD 08/10/2018, 3:36 AM

## 2018-08-28 ENCOUNTER — Encounter: Payer: Self-pay | Admitting: *Deleted

## 2018-09-05 ENCOUNTER — Other Ambulatory Visit: Payer: Self-pay

## 2018-09-05 DIAGNOSIS — E669 Obesity, unspecified: Secondary | ICD-10-CM

## 2018-09-05 MED ORDER — PHENTERMINE-TOPIRAMATE ER 7.5-46 MG PO CP24: 1.0000 | ORAL_CAPSULE | Freq: Every day | ORAL | 0 refills | Status: DC

## 2018-09-05 NOTE — Telephone Encounter (Signed)
Prior authorization was initiated and approved for Qsymia 7.5 mg-46 mg. This authorization is effective for a maximum of 3 fills from 08/06/2018-11/04/2018 as long as she is enrolled as a member of her current health plan. This request if approved for 1 capsule per day. Please note that renewal requires that you must lose at least 3% baseline body weight after 3 months of treatment.

## 2018-09-06 ENCOUNTER — Telehealth: Payer: No Typology Code available for payment source | Admitting: Physician Assistant

## 2018-09-06 DIAGNOSIS — R112 Nausea with vomiting, unspecified: Secondary | ICD-10-CM

## 2018-09-06 DIAGNOSIS — R6889 Other general symptoms and signs: Secondary | ICD-10-CM

## 2018-09-06 MED ORDER — OSELTAMIVIR PHOSPHATE 75 MG PO CAPS: 75.0000 mg | ORAL_CAPSULE | Freq: Two times a day (BID) | ORAL | 0 refills | Status: DC

## 2018-09-06 MED ORDER — ONDANSETRON HCL 4 MG PO TABS: 4.0000 mg | ORAL_TABLET | Freq: Three times a day (TID) | ORAL | 0 refills | Status: DC | PRN

## 2018-09-06 NOTE — Progress Notes (Signed)
Duplicate e-visit -- no charge.

## 2018-09-06 NOTE — Progress Notes (Signed)
E visit for Flu like symptoms   We are sorry that you are not feeling well.  Here is how we plan to help! Based on what you have shared with me it looks like you may have a respiratory virus that may be influenza.  Influenza or "the flu" is   an infection caused by a respiratory virus. The flu virus is highly contagious and persons who did not receive their yearly flu vaccination may "catch" the flu from close contact.  We have anti-viral medications to treat the viruses that cause this infection. They are not a "cure" and only shorten the course of the infection. These prescriptions are most effective when they are given within the first 2 days of "flu" symptoms. Antiviral medication are indicated if you have a high risk of complications from the flu. You should  also consider an antiviral medication if you are in close contact with someone who is at risk. These medications can help patients avoid complications from the flu  but have side effects that you should know. Possible side effects from Tamiflu or oseltamivir include nausea, vomiting, diarrhea, dizziness, headaches, eye redness, sleep problems or other respiratory symptoms. You should not take Tamiflu if you have an allergy to oseltamivir or any to the ingredients in Tamiflu.  Based upon your symptoms and potential risk factors I have prescribed Oseltamivir (Tamiflu).  It has been sent to your designated pharmacy.  You will take one 75 mg capsule orally twice a day for the next 5 days.. I also am giving you a prescription for Zofran to help with nausea and vomiting. If this does not calm down and/or you areunable to tolerate fluids by mouth, you need to go to the ER.   ANYONE WHO HAS FLU SYMPTOMS SHOULD: . Stay home. The flu is highly contagious and going out or to work exposes others! . Be sure to drink plenty of fluids. Water is fine as well as fruit juices, sodas and electrolyte beverages. You may want to stay away from caffeine or alcohol.  If you are nauseated, try taking small sips of liquids. How do you know if you are getting enough fluid? Your urine should be a pale yellow or almost colorless. . Get rest. . Taking a steamy shower or using a humidifier may help nasal congestion and ease sore throat pain. Using a saline nasal spray works much the same way. . Cough drops, hard candies and sore throat lozenges may ease your cough. . Line up a caregiver. Have someone check on you regularly.   GET HELP RIGHT AWAY IF: . You cannot keep down liquids or your medications. . You become short of breath . Your fell like you are going to pass out or loose consciousness. . Your symptoms persist after you have completed your treatment plan MAKE SURE YOU   Understand these instructions.  Will watch your condition.  Will get help right away if you are not doing well or get worse.  Your e-visit answers were reviewed by a board certified advanced clinical practitioner to complete your personal care plan.  Depending on the condition, your plan could have included both over the counter or prescription medications.  If there is a problem please reply  once you have received a response from your provider.  Your safety is important to [name].  If you have drug allergies check your prescription carefully.    You can use MyChart to ask questions about today's visit, request a non-urgent call back,  or ask for a work or school excuse for 24 hours related to this e-Visit. If it has been greater than 24 hours you will need to follow up with your provider, or enter a new e-Visit to address those concerns.  You will get an e-mail in the next two days asking about your experience.  I hope that your e-visit has been valuable and will speed your recovery. Thank you for using e-visits.

## 2018-09-17 ENCOUNTER — Other Ambulatory Visit: Payer: Self-pay

## 2018-09-17 DIAGNOSIS — D500 Iron deficiency anemia secondary to blood loss (chronic): Secondary | ICD-10-CM

## 2018-09-19 ENCOUNTER — Inpatient Hospital Stay
Payer: No Typology Code available for payment source | Attending: Hematology and Oncology | Admitting: Hematology and Oncology

## 2018-09-19 ENCOUNTER — Inpatient Hospital Stay: Payer: No Typology Code available for payment source

## 2018-09-19 ENCOUNTER — Encounter: Payer: Self-pay | Admitting: Hematology and Oncology

## 2018-09-19 ENCOUNTER — Other Ambulatory Visit: Payer: Self-pay | Admitting: Hematology and Oncology

## 2018-09-19 VITALS — BP 125/77 | HR 77 | Temp 97.9°F | Resp 18 | Ht 65.0 in | Wt 227.0 lb

## 2018-09-19 VITALS — BP 117/79 | HR 85 | Temp 96.8°F | Resp 18

## 2018-09-19 DIAGNOSIS — D500 Iron deficiency anemia secondary to blood loss (chronic): Secondary | ICD-10-CM | POA: Diagnosis present

## 2018-09-19 DIAGNOSIS — E538 Deficiency of other specified B group vitamins: Secondary | ICD-10-CM | POA: Diagnosis not present

## 2018-09-19 DIAGNOSIS — Z79899 Other long term (current) drug therapy: Secondary | ICD-10-CM

## 2018-09-19 DIAGNOSIS — N920 Excessive and frequent menstruation with regular cycle: Secondary | ICD-10-CM | POA: Insufficient documentation

## 2018-09-19 DIAGNOSIS — D509 Iron deficiency anemia, unspecified: Secondary | ICD-10-CM

## 2018-09-19 LAB — CBC WITH DIFFERENTIAL/PLATELET
Abs Immature Granulocytes: 0.03 10*3/uL (ref 0.00–0.07)
Basophils Absolute: 0 10*3/uL (ref 0.0–0.1)
Basophils Relative: 0 %
Eosinophils Absolute: 0 10*3/uL (ref 0.0–0.5)
Eosinophils Relative: 1 %
HCT: 29.7 % — ABNORMAL LOW (ref 36.0–46.0)
Hemoglobin: 9 g/dL — ABNORMAL LOW (ref 12.0–15.0)
Immature Granulocytes: 1 %
Lymphocytes Relative: 40 %
Lymphs Abs: 2.3 10*3/uL (ref 0.7–4.0)
MCH: 22.5 pg — ABNORMAL LOW (ref 26.0–34.0)
MCHC: 30.3 g/dL (ref 30.0–36.0)
MCV: 74.3 fL — ABNORMAL LOW (ref 80.0–100.0)
Monocytes Absolute: 0.5 10*3/uL (ref 0.1–1.0)
Monocytes Relative: 9 %
Neutro Abs: 2.8 10*3/uL (ref 1.7–7.7)
Neutrophils Relative %: 49 %
Platelets: 524 10*3/uL — ABNORMAL HIGH (ref 150–400)
RBC: 4 MIL/uL (ref 3.87–5.11)
RDW: 15.7 % — ABNORMAL HIGH (ref 11.5–15.5)
WBC: 5.7 10*3/uL (ref 4.0–10.5)
nRBC: 0 % (ref 0.0–0.2)

## 2018-09-19 LAB — IRON AND TIBC
Iron: 28 ug/dL (ref 28–170)
Saturation Ratios: 6 % — ABNORMAL LOW (ref 10.4–31.8)
TIBC: 457 ug/dL — ABNORMAL HIGH (ref 250–450)
UIBC: 429 ug/dL

## 2018-09-19 LAB — FERRITIN: Ferritin: 5 ng/mL — ABNORMAL LOW (ref 11–307)

## 2018-09-19 LAB — SAMPLE TO BLOOD BANK

## 2018-09-19 MED ORDER — SODIUM CHLORIDE 0.9 % IV SOLN
510.0000 mg | Freq: Once | INTRAVENOUS | Status: AC
  Administered 2018-09-19: 510 mg via INTRAVENOUS
  Filled 2018-09-19: qty 17

## 2018-09-19 MED ORDER — SODIUM CHLORIDE 0.9 % IV SOLN
Freq: Once | INTRAVENOUS | Status: AC
  Administered 2018-09-19: 10:00:00 via INTRAVENOUS
  Filled 2018-09-19: qty 250

## 2018-09-19 NOTE — Progress Notes (Signed)
No new change noted today 

## 2018-09-19 NOTE — Patient Instructions (Signed)

## 2018-09-19 NOTE — Progress Notes (Addendum)
[facility] day:  09/19/2018  Chief Complaint: Sharon Herring is a 43 y.o. female with iron deficiency anemia who is seen for 5 month assessment.  HPI: The patient was last seen in the hematology clinic on 04/13/2018.  At that time, she was very fatigued.  Menses remained heavy.  She had ice pica.  Hemoglobin was 8.3.  Ferritin was 4.  She received Feraheme 510 mg IV on 04/13/2018 and 04/20/2018.  CBC on 08/03/2018 revealed a hematocrit of 29.4, hemoglobin 9.0, MCV 76.4.  Ferritin was 6 with an iron saturation of 6% and a TIBC of 423.  Symptomatically, she remains fatigued. Her exertional dyspnea persists. She continues to have issues with her menstrual cycles. She is seeing GYN and is being considered for an ablation. Patient denies any obvious gastrointestinal bleeding. Patient denies that she has experienced any B symptoms. She denies any interval infections.   Patient advises that she maintains an adequate appetite. She is eating well. Weight today is 226 lb 15.4 oz (103 kg), which compared to her last visit to the clinic, represents a 11 pound increase. Patient confirms ice pica and restless legs.   Patient denies pain in the clinic today.   Past Medical History:  Diagnosis Date  . Anemia   . Dizziness   . Fibroid uterus   . GERD (gastroesophageal reflux disease)   . IBS (irritable bowel syndrome)   . Migraine   . Polyp of stomach 01/06/2016   Stomach polyp, pyloric, consistent with prolapse.     Past Surgical History:  Procedure Laterality Date  . APPENDECTOMY  2012  . ESOPHAGOGASTRODUODENOSCOPY (EGD) WITH PROPOFOL N/A 01/06/2016   Procedure: ESOPHAGOGASTRODUODENOSCOPY (EGD) WITH PROPOFOL;  Surgeon: Robert Bellow, MD;  Location: [facility] ENDOSCOPY;  Service: Endoscopy;  Laterality: N/A;  . ROBOTIC ASSISTED LAPAROSCOPIC CHOLECYSTECTOMY N/A 03/03/2016   Procedure: ROBOTIC ASSISTED LAPAROSCOPIC CHOLECYSTECTOMY ;  Surgeon: Clayburn Pert, MD;  Location: [facility] ORS;  Service: General;  Laterality: N/A;  . UTERINE FIBROID SURGERY  2012    Family History  Problem Relation Age of Onset  . Hypertension Mother   . Hypertension Father   . Pancreatic cancer Father 19  . Breast cancer Neg Hx   . Colon cancer Neg Hx     Social History:  reports that she has never smoked. She has never used smokeless tobacco. She reports that she does not drink alcohol or use drugs.  She denies any exposure to radiation or toxins.  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  The patient is alone today.  Allergies:  Allergies  Allergen Reactions  . Aspirin Nausea And Vomiting  . Oxycodone-Acetaminophen Rash and Shortness Of Breath  . Lexapro [Escitalopram] Palpitations  . Tape Other (See Comments)    Burning feeling. Has left mark on skin.  Melynda Keller [Lorcaserin Hcl] Other (See Comments)    headache  . Contrave [Naltrexone-Bupropion Hcl Er]   . Dicyclomine Nausea And Vomiting  . Norethindrone Rash    Current Medications: Current Outpatient Medications  Medication Sig Dispense Refill  . iron polysaccharides (NIFEREX) 150 MG capsule Take 150 mg by mouth every other day.    . Multiple Vitamins-Minerals (MULTIVITAMIN ADULT) TABS Take 1 tablet by mouth daily. 30 tablet 0  . Phentermine-Topiramate (QSYMIA) 7.5-46 MG CP24 Take 1 tablet by mouth daily. 30 capsule 0  . PREVIDENT 5000 BOOSTER PLUS 1.1 % PSTE   2  . BENZEPRO  FOAMING CLOTHS 6 % MISC Apply 1 each topically every other day.    . cholecalciferol (VITAMIN D) 1000 units tablet Take 1 tablet (1,000 Units total) by mouth daily. (Patient not taking: Reported on 09/19/2018) 30 tablet 0  . doxylamine, Sleep, (UNISOM) 25 MG tablet Take 1 tablet (25 mg total) by mouth at bedtime as needed. (Patient not taking: Reported on 09/19/2018) 30 tablet 0  . fluticasone (FLONASE) 50 MCG/ACT nasal spray Place 2 sprays into both nostrils daily. (Patient not taking:  Reported on 09/19/2018) 16 g 6  . furosemide (LASIX) 20 MG tablet Take 1 tablet (20 mg total) by mouth daily. (Patient not taking: Reported on 09/19/2018) 30 tablet 1  . ibuprofen (ADVIL,MOTRIN) 800 MG tablet Take 1 tablet (800 mg total) by mouth every 8 (eight) hours as needed. (Patient not taking: Reported on 09/19/2018) 60 tablet 3  . linaclotide (LINZESS) 145 MCG CAPS capsule Take 1 capsule (145 mcg total) by mouth daily before breakfast. (Patient not taking: Reported on 09/19/2018) 30 capsule 2  . meloxicam (MOBIC) 7.5 MG tablet Take 1 tablet (7.5 mg total) by mouth daily. (Patient not taking: Reported on 09/19/2018) 15 tablet 0  . ondansetron (ZOFRAN) 4 MG tablet Take 1 tablet (4 mg total) by mouth every 8 (eight) hours as needed for nausea or vomiting. (Patient not taking: Reported on 09/19/2018) 20 tablet 0  . oseltamivir (TAMIFLU) 75 MG capsule Take 1 capsule (75 mg total) by mouth 2 (two) times daily. (Patient not taking: Reported on 09/19/2018) 10 capsule 0  . promethazine-dextromethorphan (PROMETHAZINE-DM) 6.25-15 MG/5ML syrup Take 2.5 mLs by mouth 4 (four) times daily as needed for cough. (Patient not taking: Reported on 09/19/2018) 118 mL 0  . tranexamic acid (LYSTEDA) 650 MG TABS tablet Take 2 tablets (1,300 mg total) by mouth 3 (three) times daily. Take during menses for a maximum of five days 30 tablet 11   No current facility-administered medications for this visit.     Review of Systems:  GENERAL:  Fatigue.  Feels "a little better".  No fevers, sweats.  Weight gain of 11 pounds. PERFORMANCE STATUS (ECOG):  1 HEENT:  No visual changes, runny nose, sore throat, mouth sores or tenderness. Lungs:  Shortness of breath with exertion.  No cough.  No hemoptysis. Cardiac:  No chest pain, palpitations, orthopnea, or PND. GI:  No nausea, vomiting, diarrhea, constipation, melena or hematochezia.  Ice pica. GU:  No urgency, frequency, dysuria, or hematuria.  Heavy menses. Musculoskeletal:  No back  pain.  No joint pain.  No muscle tenderness. Extremities:  No pain or swelling. Skin:  No rashes or skin changes. Neuro:  Restless legs.  No headache, numbness or weakness, balance or coordination issues. Endocrine:  No diabetes, thyroid issues, hot flashes or night sweats. Psych:  No mood changes, depression or anxiety. Pain:  No focal pain. Review of systems:  All other systems reviewed and found to be negative.   Physical Exam: Blood pressure 125/77, pulse 77, temperature 97.9 F (36.6 C), temperature source Tympanic, resp. rate 18, height 5\' 5"  (1.651 m), weight 226 lb 15.4 oz (103 kg), SpO2 100 %. GENERAL:  Well developed, well nourished, woman sitting comfortably in the exam room in no acute distress. MENTAL STATUS:  Alert and oriented to person, place and time. HEAD:  Short styled black hair.  Normocephalic, atraumatic, face symmetric, no Cushingoid features. EYES:  Brown eyes.  Pupils equal round and reactive to light and accomodation.  No conjunctivitis or scleral icterus.  ENT:  Oropharynx clear without lesion.  Tongue normal. Mucous membranes moist.  RESPIRATORY:  Clear to auscultation without rales, wheezes or rhonchi. CARDIOVASCULAR:  Regular rate and rhythm without murmur, rub or gallop. ABDOMEN:  Soft, non-tender, with active bowel sounds, and no hepatosplenomegaly.  No masses. SKIN:  No rashes, ulcers or lesions. EXTREMITIES: No edema, no skin discoloration or tenderness.  No palpable cords. LYMPH NODES: No palpable cervical, supraclavicular, axillary or inguinal adenopathy  NEUROLOGICAL: Unremarkable. PSYCH:  Appropriate.    Appointment on 09/19/2018  Component Date Value Ref Range Status  . WBC 09/19/2018 5.7  4.0 - 10.5 K/uL Final  . RBC 09/19/2018 4.00  3.87 - 5.11 MIL/uL Final  . Hemoglobin 09/19/2018 9.0* 12.0 - 15.0 g/dL Final  . HCT 09/19/2018 29.7* 36.0 - 46.0 % Final  . MCV 09/19/2018 74.3* 80.0 - 100.0 fL Final  . MCH 09/19/2018 22.5* 26.0 - 34.0 pg Final   . MCHC 09/19/2018 30.3  30.0 - 36.0 g/dL Final  . RDW 09/19/2018 15.7* 11.5 - 15.5 % Final  . Platelets 09/19/2018 524* 150 - 400 K/uL Final  . nRBC 09/19/2018 0.0  0.0 - 0.2 % Final  . Neutrophils Relative % 09/19/2018 49  % Final  . Neutro Abs 09/19/2018 2.8  1.7 - 7.7 K/uL Final  . Lymphocytes Relative 09/19/2018 40  % Final  . Lymphs Abs 09/19/2018 2.3  0.7 - 4.0 K/uL Final  . Monocytes Relative 09/19/2018 9  % Final  . Monocytes Absolute 09/19/2018 0.5  0.1 - 1.0 K/uL Final  . Eosinophils Relative 09/19/2018 1  % Final  . Eosinophils Absolute 09/19/2018 0.0  0.0 - 0.5 K/uL Final  . Basophils Relative 09/19/2018 0  % Final  . Basophils Absolute 09/19/2018 0.0  0.0 - 0.1 K/uL Final  . Immature Granulocytes 09/19/2018 1  % Final  . Abs Immature Granulocytes 09/19/2018 0.03  0.00 - 0.07 K/uL Final   Performed at [facility], [address] [place], [place] [address]    Assessment:  AMIT MELOY is a 43 y.o. female with iron deficiency anemia secondary to heavy menses.  She has a history of on and off anemia for several years (before 02/2014).  Diet is fairly good.  She denies any melena, hematochezia, or hematuria.  She has had ice pica x 2 months.  She has a history of unresponsiveness to oral iron (4 month trial in 2015).  Labs on 07/21/2016 revealed a hematocrit of 30.9, hemoglobin 9.3, MCV 73.4, platelets 675,000, white count 5300 with an [place] of 2650. Creatinine was 0.7.  Liver function tests were normal.  Iron studies included a ferritin of 6, iron saturation 6% and TIBC of 440.  HIV testing was negative.  Labs on 08/25/2016 revealed a hematocrit of 28.3, hemoglobin 9.2, and MCV 69.6.  Ferritin was 5.  She received Feraheme 510 mg IV on 08/29/2016 and 09/05/2016, 04/13/2018 and 04/20/2018.  Ferritin goal is 100.  Ferritin has been followed:  18 on 12/02/2015, 6 on 07/21/2016, 5 on 08/25/2016, 214 on 09/21/2016, 97 on 10/10/2016, 21 on 12/08/2016, 12 on  02/09/2017, 4 on 03/23/2018, 4 on 04/13/2018, 9 on 07/10/2018, 6 on 08/03/2018, and 5 on 09/19/2018.  She has B12 deficiency.  She received B12 shots x 2-3 months.  B12 was 462 on 07/21/2016 and 434 on 12/14/2017.  Symptomatically, she remains fatigued.  She has shortness of breath on exertion.  Hemoglobin is 9.0.  Ferritin is 5.  Plan:  1.  Labs today:  CBC with diff, ferritin, iron studies, hold tube. 2.   Iron deficiency anemia  Hematocrit 29.7.  Hemoglobin 9.0.  MCV 74.3.  Ferritin 5 with an iron saturation of 6%.  Etiology secondary to heavy menses.  Feraheme today and in 1 week. 3.   RTC in 3 months for MD assessment, labs (CBC with diff, ferritin, sed rate), and +/- Feraheme.   Honor Loh, NP  09/19/2018, 9:16 AM   I saw and evaluated the patient, participating in the key portions of the service and reviewing pertinent diagnostic studies and records.  I reviewed the nurse practitioner's note and agree with the findings and the plan.  The assessment and plan were discussed with the patient.  A few questions were asked by the patient and answered.   Nolon Stalls, MD 09/19/2018, 9:16 AM

## 2018-09-26 ENCOUNTER — Inpatient Hospital Stay: Payer: No Typology Code available for payment source | Attending: Hematology and Oncology

## 2018-10-17 ENCOUNTER — Ambulatory Visit (INDEPENDENT_AMBULATORY_CARE_PROVIDER_SITE_OTHER): Payer: No Typology Code available for payment source | Admitting: Family Medicine

## 2018-10-17 ENCOUNTER — Encounter: Payer: Self-pay | Admitting: Family Medicine

## 2018-10-17 VITALS — BP 112/62 | HR 92 | Temp 98.2°F | Resp 16 | Ht 65.0 in | Wt 198.0 lb

## 2018-10-17 DIAGNOSIS — F325 Major depressive disorder, single episode, in full remission: Secondary | ICD-10-CM | POA: Diagnosis not present

## 2018-10-17 DIAGNOSIS — E538 Deficiency of other specified B group vitamins: Secondary | ICD-10-CM | POA: Diagnosis not present

## 2018-10-17 DIAGNOSIS — E669 Obesity, unspecified: Secondary | ICD-10-CM

## 2018-10-17 DIAGNOSIS — E786 Lipoprotein deficiency: Secondary | ICD-10-CM | POA: Diagnosis not present

## 2018-10-17 DIAGNOSIS — E559 Vitamin D deficiency, unspecified: Secondary | ICD-10-CM

## 2018-10-17 MED ORDER — PHENTERMINE-TOPIRAMATE ER 7.5-46 MG PO CP24: 1.0000 | ORAL_CAPSULE | Freq: Every day | ORAL | 2 refills | Status: DC

## 2018-10-17 NOTE — Progress Notes (Signed)
Name: Sharon Herring   MRN: [id number]    DOB: Jul 01, 1976   Date:10/17/2018       Progress Note  Subjective  Chief Complaint  Chief Complaint  Patient presents with  . Medication Refill  . Insomnia  . Depression  . Anemia  . Obesity  . Decreased Appetite    HPI  Edema: usually associated with her cycles, takes prn lasix to control symptoms.   Iron deficiency anemia: under the care of hematologist and is still having iron infusions, last visit with hematologist was 01/29 with Dr. Mike Gip , last infusion 02/05. She is thinking about having her fibroids removed by Dr. Marcelline Mates to resolve the cause of anemia.   Obesity: she started taking Qsymia 08/2018 every other day and also intermittent fasting, eats from 10 am to 7 pm, walking daily for 30 minutes and going to gym a few times a week. She states appetite has been under control now. She has lost 20 lbs since Nov 2019.   B12 and vitamin D deficiency: she needs to resume supplementation   Depression: she states she is doing well, phq 9 is low and only positive for lack of sleep, otherwise feeling well. No crying spells. This is chronic and recurrent but doing well now   Patient Active Problem List   Diagnosis Date Noted  . Lymphadenopathy 01/16/2018  . Moderate obstructive sleep apnea 12/19/2017  . Swelling of limb 01/27/2017  . B12 deficiency 03/28/2016  . Vitamin D deficiency 03/28/2016  . Hyperglycemia 03/28/2016  . Multiple gastric polyps 01/08/2016  . Menorrhagia 11/13/2015  . Chronic constipation 09/23/2015  . Obesity, Class I, BMI 30.0-34.9 (see actual BMI) 06/02/2015  . Iron deficiency anemia 03/14/2014  . History of uterine fibroid 03/14/2014    Past Surgical History:  Procedure Laterality Date  . APPENDECTOMY  2012  . ESOPHAGOGASTRODUODENOSCOPY (EGD) WITH PROPOFOL N/A 01/06/2016   Procedure: ESOPHAGOGASTRODUODENOSCOPY (EGD) WITH PROPOFOL;  Surgeon: Robert Bellow, MD;  Location: [facility] END[facility];  Service:  Endoscopy;  Laterality: N/A;  . ROBOTIC ASSISTED LAPAROSCOPIC CHOLECYSTECTOMY N/A 03/03/2016   Procedure: ROBOTIC ASSISTED LAPAROSCOPIC CHOLECYSTECTOMY ;  Surgeon: Clayburn Pert, MD;  Location: [facility] ORS;  Service: General;  Laterality: N/A;  . UTERINE FIBROID SURGERY  2012    Family History  Problem Relation Age of Onset  . Hypertension Mother   . Hypertension Father   . Pancreatic cancer Father 70  . Breast cancer Neg Hx   . Colon cancer Neg Hx     Social History   Socioeconomic History  . Marital status: Single    Spouse name: Not on file  . Number of children: 0  . Years of education: Not on file  . Highest education level: Bachelor's degree (e.g., BA, AB, BS)  Occupational History  . Occupation: CMA  Social Needs  . Financial resource strain: Not hard at all  . Food insecurity:    Worry: Never true    Inability: Never true  . Transportation needs:    Medical: No    Non-medical: No  Tobacco Use  . Smoking status: Never Smoker  . Smokeless tobacco: Never Used  Substance and Sexual Activity  . Alcohol use: No    Alcohol/week: 0.0 standard drinks  . Drug use: No  . Sexual activity: Yes    Partners: Male    Birth control/protection: Condom  Lifestyle  . Physical activity:    Days per week: 7 days    Minutes per session: 30 min  . Stress:  Not at all  Relationships  . Social connections:    Talks on phone: More than three times a week    Gets together: More than three times a week    Attends religious service: More than 4 times per year    Active member of club or organization: Yes    Attends meetings of clubs or organizations: More than 4 times per year    Relationship status: Never married  . Intimate partner violence:    Fear of current or ex partner: No    Emotionally abused: No    Physically abused: No    Forced sexual activity: No  Other Topics Concern  . Not on file  Social History Narrative   Lives alone, works for [employer] at the [employer] [facility] [employer] , but switching  to [employer]      Current Outpatient Medications:  .  BENZEPRO FOAMING CLOTHS 6 % MISC, Apply 1 each topically every other day., Disp: , Rfl:  .  fluticasone (FLONASE) 50 MCG/ACT nasal spray, Place 2 sprays into both nostrils daily. (Patient taking differently: Place 2 sprays into both nostrils daily as needed for allergies. ), Disp: 16 g, Rfl: 6 .  furosemide (LASIX) 20 MG tablet, Take 1 tablet (20 mg total) by mouth daily. (Patient taking differently: Take 20 mg by mouth daily as needed. ), Disp: 30 tablet, Rfl: 1 .  ibuprofen (ADVIL,MOTRIN) 800 MG tablet, Take 1 tablet (800 mg total) by mouth every 8 (eight) hours as needed., Disp: 60 tablet, Rfl: 3 .  Iron-FA-B Cmp-C-Biot-Probiotic (FUSION PLUS) CAPS, Take 1 capsule by mouth daily., Disp: , Rfl:  .  linaclotide (LINZESS) 145 MCG CAPS capsule, Take 1 capsule (145 mcg total) by mouth daily before breakfast., Disp: 30 capsule, Rfl: 2 .  Multiple Vitamins-Minerals (MULTIVITAMIN ADULT) TABS, Take 1 tablet by mouth daily., Disp: 30 tablet, Rfl: 0 .  Phentermine-Topiramate (QSYMIA) 7.5-46 MG CP24, Take 1 tablet by mouth daily., Disp: 30 capsule, Rfl: 2 .  PREVIDENT 5000 BOOSTER PLUS 1.1 % PSTE, , Disp: , Rfl: 2 .  tranexamic acid (LYSTEDA) 650 MG TABS tablet, Take 2 tablets (1,300 mg total) by mouth 3 (three) times daily. Take during menses for a maximum of five days, Disp: 30 tablet, Rfl: 11 .  iron polysaccharides (NIFEREX) 150 MG capsule, Take 150 mg by mouth every other day., Disp: , Rfl:   Allergies  Allergen Reactions  . Aspirin Nausea And Vomiting  . Oxycodone-Acetaminophen Rash and Shortness Of Breath  . Lexapro [Escitalopram] Palpitations  . Tape Other (See Comments)    Burning feeling. Has left mark on skin.  Melynda Keller [Lorcaserin Hcl] Other (See Comments)    headache  . Contrave [Naltrexone-Bupropion Hcl Er]   . Dicyclomine Nausea And Vomiting  . Norethindrone Rash    I personally reviewed active problem list, medication list,  social history with the patient/caregiver today.   ROS  Constitutional: Negative for fever , positive for weight change.  Respiratory: Negative for cough and shortness of breath.   Cardiovascular: Negative for chest pain or palpitations.  Gastrointestinal: Negative for abdominal pain, no bowel changes.  Musculoskeletal: Negative for gait problem or joint swelling.  Skin: Negative for rash.  Neurological: Negative for dizziness or headache.  No other specific complaints in a complete review of systems (except as listed in HPI above).   Objective  Vitals:   10/17/18 0952  BP: 112/62  Pulse: 92  Resp: 16  Temp: 98.2 F (36.8 C)  TempSrc: Oral  SpO2: 99%  Weight: 198 lb (89.8 kg)  Height: 5\' 5"  (1.651 m)    Body mass index is 32.95 kg/m.  Physical Exam  Constitutional: Patient appears well-developed and well-nourished. Obese  No distress.  HEENT: head atraumatic, normocephalic, pupils equal and reactive to light,  neck supple, throat within normal limits Cardiovascular: Normal rate, regular rhythm and normal heart sounds.  No murmur heard. No BLE edema. Pulmonary/Chest: Effort normal and breath sounds normal. No respiratory distress. Abdominal: Soft.  There is no tenderness. Psychiatric: Patient has a normal mood and affect. behavior is normal. Judgment and thought content normal.  Recent Results (from the past 2160 hour(s))  Iron and TIBC     Status: Abnormal   Collection Time: 08/03/18  2:33 PM  Result Value Ref Range   Iron 25 (L) 28 - 170 ug/dL   TIBC 423 250 - 450 ug/dL   Saturation Ratios 6 (L) 10.4 - 31.8 %   UIBC 398 ug/dL    Comment: Performed at [facility], [address] [place], [place] [address]  CBC with Differential/Platelet     Status: Abnormal   Collection Time: 08/03/18  2:33 PM  Result Value Ref Range   WBC 5.6 4.0 - 10.5 K/uL   RBC 3.85 (L) 3.87 - 5.11 MIL/uL   Hemoglobin 9.0 (L) 12.0 - 15.0 g/dL    Comment: Reticulocyte Hemoglobin  testing may be clinically indicated, consider ordering this additional test [id number]    HCT 29.4 (L) 36.0 - 46.0 %   MCV 76.4 (L) 80.0 - 100.0 fL   MCH 23.4 (L) 26.0 - 34.0 pg   MCHC 30.6 30.0 - 36.0 g/dL   RDW 17.0 (H) 11.5 - 15.5 %   Platelets 465 (H) 150 - 400 K/uL   nRBC 0.0 0.0 - 0.2 %   Neutrophils Relative % 50 %   Neutro Abs 2.9 1.7 - 7.7 K/uL   Lymphocytes Relative 38 %   Lymphs Abs 2.1 0.7 - 4.0 K/uL   Monocytes Relative 9 %   Monocytes Absolute 0.5 0.1 - 1.0 K/uL   Eosinophils Relative 2 %   Eosinophils Absolute 0.1 0.0 - 0.5 K/uL   Basophils Relative 0 %   Basophils Absolute 0.0 0.0 - 0.1 K/uL   Immature Granulocytes 1 %   Abs Immature Granulocytes 0.04 0.00 - 0.07 K/uL    Comment: Performed at [facility], [address] [place], [place] [address]  Ferritin     Status: Abnormal   Collection Time: 08/03/18  2:33 PM  Result Value Ref Range   Ferritin 6 (L) 11 - 307 ng/mL    Comment: Performed at [facility], [address] [place], [place] [address]  POCT Influenza A/B     Status: Normal   Collection Time: 08/07/18 11:59 AM  Result Value Ref Range   Influenza A, POC Negative Negative   Influenza B, POC Negative Negative  Hold Tube- Blood Bank     Status: None   Collection Time: 09/19/18  8:38 AM  Result Value Ref Range   Blood Bank Specimen SAMPLE AVAILABLE FOR TESTING    Sample Expiration      09/22/2018 Performed at [facility], [address] [place], [place] [address]   Iron and TIBC     Status: Abnormal   Collection Time: 09/19/18  8:38 AM  Result Value Ref Range   Iron 28 28 - 170 ug/dL   TIBC 457 (H) 250 - 450 ug/dL  Saturation Ratios 6 (L) 10.4 - 31.8 %   UIBC 429 ug/dL    Comment: Performed at [facility], [address] [place], [place] [address]  Ferritin     Status: Abnormal   Collection Time: 09/19/18  8:38 AM  Result Value Ref Range   Ferritin 5 (L) 11 - 307 ng/mL    Comment: Performed at  [facility], [address] [place], [place] [address]  CBC with Differential/Platelet     Status: Abnormal   Collection Time: 09/19/18  8:38 AM  Result Value Ref Range   WBC 5.7 4.0 - 10.5 K/uL   RBC 4.00 3.87 - 5.11 MIL/uL   Hemoglobin 9.0 (L) 12.0 - 15.0 g/dL   HCT 29.7 (L) 36.0 - 46.0 %   MCV 74.3 (L) 80.0 - 100.0 fL   MCH 22.5 (L) 26.0 - 34.0 pg   MCHC 30.3 30.0 - 36.0 g/dL   RDW 15.7 (H) 11.5 - 15.5 %   Platelets 524 (H) 150 - 400 K/uL   nRBC 0.0 0.0 - 0.2 %   Neutrophils Relative % 49 %   Neutro Abs 2.8 1.7 - 7.7 K/uL   Lymphocytes Relative 40 %   Lymphs Abs 2.3 0.7 - 4.0 K/uL   Monocytes Relative 9 %   Monocytes Absolute 0.5 0.1 - 1.0 K/uL   Eosinophils Relative 1 %   Eosinophils Absolute 0.0 0.0 - 0.5 K/uL   Basophils Relative 0 %   Basophils Absolute 0.0 0.0 - 0.1 K/uL   Immature Granulocytes 1 %   Abs Immature Granulocytes 0.03 0.00 - 0.07 K/uL    Comment: Performed at [facility], [address], [place], [place] [address]     PHQ2/9: Depression screen [facility] 2/9 10/17/2018 08/07/2018 07/17/2018 03/23/2018 12/14/2017  Decreased Interest 0 0 0 1 0  Down, Depressed, Hopeless 0 0 0 1 0  PHQ - 2 Score 0 0 0 2 0  Altered sleeping 0 1 1 3  -  Tired, decreased energy 2 1 1 3  -  Change in appetite 0 0 1 3 -  Feeling bad or failure about yourself  0 0 0 0 -  Trouble concentrating 0 0 0 2 -  Moving slowly or fidgety/restless 0 0 0 0 -  Suicidal thoughts 0 0 0 0 -  PHQ-9 Score 2 2 3 13  -  Difficult doing work/chores Not difficult at all Not difficult at all Not difficult at all Very difficult -    phq9 reviewed in remission, she is helping care for grandfather at night and does not sleep well  Fall Risk: Fall Risk  10/17/2018 08/07/2018 07/17/2018 04/20/2018 03/23/2018  Falls in the past year? 0 0 0 No No  Number falls in past yr: - 0 0 - -  Injury with Fall? - 0 - - -     Functional Status Survey: Is the patient deaf or have difficulty  hearing?: No Does the patient have difficulty seeing, even when wearing glasses/contacts?: No Does the patient have difficulty concentrating, remembering, or making decisions?: No Does the patient have difficulty walking or climbing stairs?: No Does the patient have difficulty dressing or bathing?: No Does the patient have difficulty doing errands alone such as visiting a doctor's office or shopping?: No    Assessment & Plan  1. Obesity, Class I, BMI 30.0-34.9 (see actual BMI)  She is doing intermittent fasting and qsymia and is doing well  - Phentermine-Topiramate (QSYMIA) 7.5-46 MG CP24; Take  1 tablet by mouth daily.  Dispense: 30 capsule; Refill: 2  2. B12 deficiency  Needs to resume B12 supplementation   3. Major depression in remission [facility])  Doing well at this time  4. Low HDL (under 40)  Exercising daily walking and sometimes the gym  5. Vitamin D deficiency  Resume supplementation

## 2018-10-18 ENCOUNTER — Ambulatory Visit (INDEPENDENT_AMBULATORY_CARE_PROVIDER_SITE_OTHER): Payer: No Typology Code available for payment source | Admitting: Obstetrics and Gynecology

## 2018-10-18 ENCOUNTER — Encounter: Payer: Self-pay | Admitting: Obstetrics and Gynecology

## 2018-10-18 VITALS — BP 120/79 | HR 69 | Ht 65.0 in | Wt 226.7 lb

## 2018-10-18 DIAGNOSIS — D250 Submucous leiomyoma of uterus: Secondary | ICD-10-CM

## 2018-10-18 DIAGNOSIS — N920 Excessive and frequent menstruation with regular cycle: Secondary | ICD-10-CM

## 2018-10-18 DIAGNOSIS — D251 Intramural leiomyoma of uterus: Secondary | ICD-10-CM | POA: Diagnosis not present

## 2018-10-18 MED ORDER — NORETHINDRONE ACETATE 5 MG PO TABS: 5.0000 mg | ORAL_TABLET | Freq: Every day | ORAL | 2 refills | Status: DC

## 2018-10-18 NOTE — Progress Notes (Signed)
    GYNECOLOGY PROGRESS NOTE  Subjective:    Patient ID: Sharon Herring, female    DOB: 1976/07/06, 43 y.o.   MRN: [id number]  HPI  Patient is a 43 y.o. G0P0000 female with a h/o fibroids who presents for complaints of prolonged menstrual cycles.  She notes that her cycles are becoming heavier again with clotting, lasting 9 days. She also reports that they are becoming more painful. Takes Ibuprofen which helps some.  Of note, she experienced similar complaints ~ 1 year ago, and opted for use of Lysteda. She states that it worked well for the first 6-8 months, however lately her cycles seem to be getting worse again. Does not desires definitive management with hysterectomy.    The following portions of the patient's history were reviewed and updated as appropriate: allergies, current medications, past family history, past medical history, past social history, past surgical history and problem list.   Review of Systems Pertinent items noted in HPI and remainder of comprehensive ROS otherwise negative.   Objective:   Blood pressure 120/79, pulse 69, height 5\' 5"  (1.651 m), weight 226 lb 11.2 oz (102.8 kg), last menstrual period 09/29/2018. General appearance: alert and no distress Abdomen: soft, non-tender; bowel sounds normal; no masses,  no organomegaly Pelvic: deferred   Imaging:  ULTRASOUND REPORT  Location: [facility] Date of Service: 02/13/17   Indications:fibroids Findings:  The uterus measures 11.3 x 7.5 x 7.6 cm. Echo texture is heterogenous with evidence of focal masses. Within the uterus are multiple suspected fibroids measuring: Fibroid 1:Anterior fundal submucosal 2.8 x 3.8 x 2.8 cm Fibroid 2:Posterior Fundal submucosal 3.6 x 3.9 x 4.3 cm Fibroid 3:fundal intramural 4.4 x 3.4 x 3.7 cm The Endometrium measures 6.3 mm.  Right Ovary measures 3 x 1.6 x 1.6 cm. It is normal in appearance. Left Ovary measures 3.2 x 2.1 x 2.4 cm. It is normal  appearance. Survey of the adnexa demonstrates no adnexal masses. There is no free fluid in the cul de sac.  Impression: 1. Fibroid uterus  Recommendations: 1.Clinical correlation with the patient's History and Physical Exam.   [name]  The ultrasound images and findings were reviewed by me and I agree with the above report.  Finis Bud, M.D. 02/23/2017 1:04 PM  Assessment:   Fibroid uterus Menorrhagia Dysmenorrhea  Plan:   - Discussion had with patient regarding her current symptoms. Advised that since it has been almost 2 years since her last ultrasound, repeat imaging was warranted to assess for growth of the fibroids or any other structural abnormalities.  - Discussion had on management of menorrhagia, likely secondary to fibroids, but may also be the beginnings of perimenopausal bleeding due to hormonal imbalance. Reviewed options of oral progesterone, Depo Provera, Levonogestrel IUD, endometrial ablation, Depot Lupron for fibroids, or myomectomy. Again declines hysterectomy as definitive surgical management.  Briefly discussed risks and benefits of each method. Given prescription for Aygestin 5 mg daily to take until ultrasound performed and further discussion can be had on desired management options.     - F/u after next available ultrasound scheduled.    A total of 15 minutes were spent face-to-face with the patient during this encounter and over half of that time dealt with counseling and coordination of care.   Rubie Maid, MD [facility]

## 2018-10-18 NOTE — Progress Notes (Signed)
Pt present today for discuss about surgery for fibroid removal. Pt stated that her cycles are heavy and prolong, clots, and cramping with pain.

## 2018-10-21 ENCOUNTER — Encounter: Payer: Self-pay | Admitting: Obstetrics and Gynecology

## 2018-10-29 ENCOUNTER — Encounter: Payer: Self-pay | Admitting: Family Medicine

## 2018-10-29 ENCOUNTER — Ambulatory Visit (INDEPENDENT_AMBULATORY_CARE_PROVIDER_SITE_OTHER): Payer: No Typology Code available for payment source | Admitting: Family Medicine

## 2018-10-29 VITALS — BP 120/90 | HR 80 | Temp 98.2°F | Resp 16 | Ht 65.0 in | Wt 226.7 lb

## 2018-10-29 DIAGNOSIS — J40 Bronchitis, not specified as acute or chronic: Secondary | ICD-10-CM | POA: Diagnosis not present

## 2018-10-29 MED ORDER — HYDROCOD POLST-CPM POLST ER 10-8 MG/5ML PO SUER: 5.0000 mL | Freq: Two times a day (BID) | ORAL | 0 refills | Status: DC | PRN

## 2018-10-29 MED ORDER — FLUTICASONE FUROATE-VILANTEROL 100-25 MCG/INH IN AEPB: 1.0000 | INHALATION_SPRAY | Freq: Every day | RESPIRATORY_TRACT | 0 refills | Status: DC

## 2018-10-29 NOTE — Progress Notes (Signed)
Name: Sharon Herring   MRN: [id number]    DOB: 1976-01-29   Date:10/29/2018       Progress Note  Subjective  Chief Complaint  Chief Complaint  Patient presents with  . Bronchitis    possible    HPI  Cough: she developed cold symptoms two days ago with rhinorrhea, nasal congestion, drainage from sinus that is yellow with some streaks of blood. Chills and also a dry cough, she states her chest burns when breathing. She also has noticed decrease in appetite but able to stay hydrated. No previous history of asthma and she does not smoke.    Patient Active Problem List   Diagnosis Date Noted  . Lymphadenopathy 01/16/2018  . Moderate obstructive sleep apnea 12/19/2017  . Swelling of limb 01/27/2017  . B12 deficiency 03/28/2016  . Vitamin D deficiency 03/28/2016  . Hyperglycemia 03/28/2016  . Multiple gastric polyps 01/08/2016  . Menorrhagia 11/13/2015  . Chronic constipation 09/23/2015  . Obesity, Class I, BMI 30.0-34.9 (see actual BMI) 06/02/2015  . Iron deficiency anemia 03/14/2014  . History of uterine fibroid 03/14/2014    Past Surgical History:  Procedure Laterality Date  . APPENDECTOMY  2012  . ESOPHAGOGASTRODUODENOSCOPY (EGD) WITH PROPOFOL N/A 01/06/2016   Procedure: ESOPHAGOGASTRODUODENOSCOPY (EGD) WITH PROPOFOL;  Surgeon: Robert Bellow, MD;  Location: [facility];  Service: Endoscopy;  Laterality: N/A;  . ROBOTIC ASSISTED LAPAROSCOPIC CHOLECYSTECTOMY N/A 03/03/2016   Procedure: ROBOTIC ASSISTED LAPAROSCOPIC CHOLECYSTECTOMY ;  Surgeon: Clayburn Pert, MD;  Location: [facility] ORS;  Service: General;  Laterality: N/A;  . UTERINE FIBROID SURGERY  2012    Family History  Problem Relation Age of Onset  . Hypertension Mother   . Hypertension Father   . Pancreatic cancer Father 60  . Breast cancer Neg Hx   . Colon cancer Neg Hx     Social History   Socioeconomic History  . Marital status: Single    Spouse name: Not on file  . Number of children: 0  . Years of  education: Not on file  . Highest education level: Bachelor's degree (e.g., BA, AB, BS)  Occupational History  . Occupation: CMA  Social Needs  . Financial resource strain: Not hard at all  . Food insecurity:    Worry: Never true    Inability: Never true  . Transportation needs:    Medical: No    Non-medical: No  Tobacco Use  . Smoking status: Never Smoker  . Smokeless tobacco: Never Used  Substance and Sexual Activity  . Alcohol use: No    Alcohol/week: 0.0 standard drinks  . Drug use: No  . Sexual activity: Not Currently    Partners: Male    Birth control/protection: Condom  Lifestyle  . Physical activity:    Days per week: 7 days    Minutes per session: 30 min  . Stress: Not at all  Relationships  . Social connections:    Talks on phone: More than three times a week    Gets together: More than three times a week    Attends religious service: More than 4 times per year    Active member of club or organization: Yes    Attends meetings of clubs or organizations: More than 4 times per year    Relationship status: Never married  . Intimate partner violence:    Fear of current or ex partner: No    Emotionally abused: No    Physically abused: No    Forced sexual activity: No  Other Topics Concern  . Not on file  Social History Narrative   Lives alone, works for [employer] at the [employer] , but switching to [employer]      Current Outpatient Medications:  .  BENZEPRO FOAMING CLOTHS 6 % MISC, Apply 1 each topically every other day., Disp: , Rfl:  .  fluticasone (FLONASE) 50 MCG/ACT nasal spray, Place 2 sprays into both nostrils daily. (Patient taking differently: Place 2 sprays into both nostrils daily as needed for allergies. ), Disp: 16 g, Rfl: 6 .  furosemide (LASIX) 20 MG tablet, Take 1 tablet (20 mg total) by mouth daily. (Patient taking differently: Take 20 mg by mouth daily as needed. ), Disp: 30 tablet, Rfl: 1 .  ibuprofen (ADVIL,MOTRIN) 800 MG tablet, Take 1 tablet (800 mg  total) by mouth every 8 (eight) hours as needed., Disp: 60 tablet, Rfl: 3 .  iron polysaccharides (NIFEREX) 150 MG capsule, Take 150 mg by mouth every other day., Disp: , Rfl:  .  Iron-FA-B Cmp-C-Biot-Probiotic (FUSION PLUS) CAPS, Take 1 capsule by mouth daily., Disp: , Rfl:  .  linaclotide (LINZESS) 145 MCG CAPS capsule, Take 1 capsule (145 mcg total) by mouth daily before breakfast., Disp: 30 capsule, Rfl: 2 .  Multiple Vitamins-Minerals (MULTIVITAMIN ADULT) TABS, Take 1 tablet by mouth daily., Disp: 30 tablet, Rfl: 0 .  norethindrone (AYGESTIN) 5 MG tablet, Take 1 tablet (5 mg total) by mouth daily., Disp: 30 tablet, Rfl: 2 .  Phentermine-Topiramate (QSYMIA) 7.5-46 MG CP24, Take 1 tablet by mouth daily., Disp: 30 capsule, Rfl: 2 .  PREVIDENT 5000 BOOSTER PLUS 1.1 % PSTE, , Disp: , Rfl: 2 .  tranexamic acid (LYSTEDA) 650 MG TABS tablet, Take 2 tablets (1,300 mg total) by mouth 3 (three) times daily. Take during menses for a maximum of five days, Disp: 30 tablet, Rfl: 11 .  chlorpheniramine-HYDROcodone (TUSSIONEX PENNKINETIC ER) 10-8 MG/5ML SUER, Take 5 mLs by mouth every 12 (twelve) hours as needed., Disp: 140 mL, Rfl: 0 .  fluticasone furoate-vilanterol (BREO ELLIPTA) 100-25 MCG/INH AEPB, Inhale 1 puff into the lungs daily., Disp: 60 each, Rfl: 0  Allergies  Allergen Reactions  . Aspirin Nausea And Vomiting  . Oxycodone-Acetaminophen Rash and Shortness Of Breath  . Lexapro [Escitalopram] Palpitations  . Tape Other (See Comments)    Burning feeling. Has left mark on skin.  Melynda Keller [Lorcaserin Hcl] Other (See Comments)    headache  . Contrave [Naltrexone-Bupropion Hcl Er]   . Dicyclomine Nausea And Vomiting  . Norethindrone Rash    I personally reviewed active problem list, medication list, allergies, family history, social history with the patient/caregiver today.   ROS  Constitutional: Negative for fever or weight change.  Respiratory: Negative for cough and shortness of breath.    Cardiovascular: Negative for chest pain or palpitations.  Gastrointestinal: Negative for abdominal pain, no bowel changes.  Musculoskeletal: Negative for gait problem or joint swelling.  Skin: Negative for rash.  Neurological: Negative for dizziness or headache.  No other specific complaints in a complete review of systems (except as listed in HPI above).  Objective  Vitals:   10/29/18 1243  BP: 120/90  Pulse: 80  Resp: 16  Temp: 98.2 F (36.8 C)  TempSrc: Oral  SpO2: 100%  Weight: 226 lb 11.2 oz (102.8 kg)  Height: 5\' 5"  (1.651 m)    Body mass index is 37.72 kg/m.  Physical Exam  Constitutional: Patient appears well-developed and well-nourished. Obese  No distress.  HEENT: head atraumatic,  normocephalic, pupils equal and reactive to light, ears normal TM bilaterally, neck supple, throat within normal limits Cardiovascular: Normal rate, regular rhythm and normal heart sounds.  No murmur heard. No BLE edema. Pulmonary/Chest: Effort normal and breath sounds normal. No respiratory distress. Abdominal: Soft.  There is no tenderness. Psychiatric: Patient has a normal mood and affect. behavior is normal. Judgment and thought content normal.  Recent Results (from the past 2160 hour(s))  Iron and TIBC     Status: Abnormal   Collection Time: 08/03/18  2:33 PM  Result Value Ref Range   Iron 25 (L) 28 - 170 ug/dL   TIBC 423 250 - 450 ug/dL   Saturation Ratios 6 (L) 10.4 - 31.8 %   UIBC 398 ug/dL    Comment: Performed at [facility], [address] [place], [place] [address]  CBC with Differential/Platelet     Status: Abnormal   Collection Time: 08/03/18  2:33 PM  Result Value Ref Range   WBC 5.6 4.0 - 10.5 K/uL   RBC 3.85 (L) 3.87 - 5.11 MIL/uL   Hemoglobin 9.0 (L) 12.0 - 15.0 g/dL    Comment: Reticulocyte Hemoglobin testing may be clinically indicated, consider ordering this additional test [id number]    HCT 29.4 (L) 36.0 - 46.0 %   MCV 76.4 (L) 80.0 - 100.0  fL   MCH 23.4 (L) 26.0 - 34.0 pg   MCHC 30.6 30.0 - 36.0 g/dL   RDW 17.0 (H) 11.5 - 15.5 %   Platelets 465 (H) 150 - 400 K/uL   nRBC 0.0 0.0 - 0.2 %   Neutrophils Relative % 50 %   Neutro Abs 2.9 1.7 - 7.7 K/uL   Lymphocytes Relative 38 %   Lymphs Abs 2.1 0.7 - 4.0 K/uL   Monocytes Relative 9 %   Monocytes Absolute 0.5 0.1 - 1.0 K/uL   Eosinophils Relative 2 %   Eosinophils Absolute 0.1 0.0 - 0.5 K/uL   Basophils Relative 0 %   Basophils Absolute 0.0 0.0 - 0.1 K/uL   Immature Granulocytes 1 %   Abs Immature Granulocytes 0.04 0.00 - 0.07 K/uL    Comment: Performed at [facility], [address] [place], [place] [address]  Ferritin     Status: Abnormal   Collection Time: 08/03/18  2:33 PM  Result Value Ref Range   Ferritin 6 (L) 11 - 307 ng/mL    Comment: Performed at [facility], [address] [place], [place] [address]  POCT Influenza A/B     Status: Normal   Collection Time: 08/07/18 11:59 AM  Result Value Ref Range   Influenza A, POC Negative Negative   Influenza B, POC Negative Negative  Hold Tube- Blood Bank     Status: None   Collection Time: 09/19/18  8:38 AM  Result Value Ref Range   Blood Bank Specimen SAMPLE AVAILABLE FOR TESTING    Sample Expiration      09/22/2018 Performed at [facility], [address] [place], [place] [address]   Iron and TIBC     Status: Abnormal   Collection Time: 09/19/18  8:38 AM  Result Value Ref Range   Iron 28 28 - 170 ug/dL   TIBC 457 (H) 250 - 450 ug/dL   Saturation Ratios 6 (L) 10.4 - 31.8 %   UIBC 429 ug/dL    Comment: Performed at [facility], [address] [place], [place] [address]  Ferritin     Status: Abnormal  Collection Time: 09/19/18  8:38 AM  Result Value Ref Range   Ferritin 5 (L) 11 - 307 ng/mL    Comment: Performed at [facility], [address] [place], [place] [address]  CBC with Differential/Platelet     Status: Abnormal   Collection Time: 09/19/18   8:38 AM  Result Value Ref Range   WBC 5.7 4.0 - 10.5 K/uL   RBC 4.00 3.87 - 5.11 MIL/uL   Hemoglobin 9.0 (L) 12.0 - 15.0 g/dL   HCT 29.7 (L) 36.0 - 46.0 %   MCV 74.3 (L) 80.0 - 100.0 fL   MCH 22.5 (L) 26.0 - 34.0 pg   MCHC 30.3 30.0 - 36.0 g/dL   RDW 15.7 (H) 11.5 - 15.5 %   Platelets 524 (H) 150 - 400 K/uL   nRBC 0.0 0.0 - 0.2 %   Neutrophils Relative % 49 %   Neutro Abs 2.8 1.7 - 7.7 K/uL   Lymphocytes Relative 40 %   Lymphs Abs 2.3 0.7 - 4.0 K/uL   Monocytes Relative 9 %   Monocytes Absolute 0.5 0.1 - 1.0 K/uL   Eosinophils Relative 1 %   Eosinophils Absolute 0.0 0.0 - 0.5 K/uL   Basophils Relative 0 %   Basophils Absolute 0.0 0.0 - 0.1 K/uL   Immature Granulocytes 1 %   Abs Immature Granulocytes 0.03 0.00 - 0.07 K/uL    Comment: Performed at [facility], [address] [place], [place] [address]     PHQ2/9: Depression screen [facility] 2/9 10/17/2018 08/07/2018 07/17/2018 03/23/2018 12/14/2017  Decreased Interest 0 0 0 1 0  Down, Depressed, Hopeless 0 0 0 1 0  PHQ - 2 Score 0 0 0 2 0  Altered sleeping 0 1 1 3  -  Tired, decreased energy 2 1 1 3  -  Change in appetite 0 0 1 3 -  Feeling bad or failure about yourself  0 0 0 0 -  Trouble concentrating 0 0 0 2 -  Moving slowly or fidgety/restless 0 0 0 0 -  Suicidal thoughts 0 0 0 0 -  PHQ-9 Score 2 2 3 13  -  Difficult doing work/chores Not difficult at all Not difficult at all Not difficult at all Very difficult -    Fall Risk: Fall Risk  10/17/2018 08/07/2018 07/17/2018 04/20/2018 03/23/2018  Falls in the past year? 0 0 0 No No  Number falls in past yr: - 0 0 - -  Injury with Fall? - 0 - - -   Assessment & Plan  1. Bronchitis  - chlorpheniramine-HYDROcodone (TUSSIONEX PENNKINETIC ER) 10-8 MG/5ML SUER; Take 5 mLs by mouth every 12 (twelve) hours as needed.  Dispense: 140 mL; Refill: 0 - fluticasone furoate-vilanterol (BREO ELLIPTA) 100-25 MCG/INH AEPB; Inhale 1 puff into the lungs daily.  Dispense: 60 each;  Refill: 0

## 2018-11-01 ENCOUNTER — Other Ambulatory Visit: Payer: Self-pay | Admitting: Family Medicine

## 2018-11-01 ENCOUNTER — Other Ambulatory Visit: Payer: Self-pay

## 2018-11-01 ENCOUNTER — Ambulatory Visit
Admission: RE | Admit: 2018-11-01 | Discharge: 2018-11-01 | Disposition: A | Discharge status: Home / Self Care | Payer: No Typology Code available for payment source | Attending: Family Medicine | Admitting: Family Medicine

## 2018-11-01 ENCOUNTER — Ambulatory Visit
Admission: RE | Admit: 2018-11-01 | Discharge: 2018-11-01 | Disposition: A | Discharge status: Home / Self Care | Payer: No Typology Code available for payment source | Source: Ambulatory Visit | Attending: Family Medicine | Admitting: Family Medicine

## 2018-11-01 DIAGNOSIS — R05 Cough: Secondary | ICD-10-CM | POA: Insufficient documentation

## 2018-11-01 DIAGNOSIS — R059 Cough, unspecified: Secondary | ICD-10-CM

## 2018-11-05 ENCOUNTER — Encounter: Payer: Self-pay | Admitting: Family Medicine

## 2018-11-05 ENCOUNTER — Other Ambulatory Visit: Payer: Self-pay

## 2018-11-05 ENCOUNTER — Ambulatory Visit (INDEPENDENT_AMBULATORY_CARE_PROVIDER_SITE_OTHER): Payer: No Typology Code available for payment source | Admitting: Family Medicine

## 2018-11-05 VITALS — BP 112/76 | HR 82 | Temp 98.2°F | Resp 16 | Ht 65.0 in | Wt 226.0 lb

## 2018-11-05 DIAGNOSIS — J40 Bronchitis, not specified as acute or chronic: Secondary | ICD-10-CM | POA: Diagnosis not present

## 2018-11-05 DIAGNOSIS — R112 Nausea with vomiting, unspecified: Secondary | ICD-10-CM

## 2018-11-05 LAB — POCT INFLUENZA A/B
Influenza A, POC: NEGATIVE
Influenza B, POC: NEGATIVE

## 2018-11-05 LAB — POCT RAPID STREP A (OFFICE): Rapid Strep A Screen: NEGATIVE

## 2018-11-05 MED ORDER — BENZONATATE 100 MG PO CAPS: 100.0000 mg | ORAL_CAPSULE | Freq: Two times a day (BID) | ORAL | 0 refills | Status: DC | PRN

## 2018-11-05 MED ORDER — ONDANSETRON 4 MG PO TBDP: 4.0000 mg | ORAL_TABLET | Freq: Three times a day (TID) | ORAL | 0 refills | Status: DC | PRN

## 2018-11-05 NOTE — Progress Notes (Signed)
Name: Sharon Herring   MRN: [id number]    DOB: August 02, 1976   Date:11/05/2018       Progress Note  Subjective  Chief Complaint  Chief Complaint  Patient presents with  . URI    still sick, cough, sore throat, low grade fever, chest tightness    HPI  URI: she is back today, Samaya has been sick since March 7th, 2020. Initially she had cold symptoms, followed by post-nasal drainage and a cough. She was given Breo and tussionex, symptoms did not improve and because of sob we ordered a CXR on 11/01/2018 that was negative , but because symptoms had gotten worse she was given a Zpack, she came in again today because last night she developed nausea, vomiting and some diarrhea. She has been isolating herself. She has been afebrile, she denies being exposed to anyone that could have COVID-19, we checked rapid strep and flu that were both negative. She does not have a fever. Her symptoms started prior to any cases of COVID -19 were reported in our area   Patient Active Problem List   Diagnosis Date Noted  . Lymphadenopathy 01/16/2018  . Moderate obstructive sleep apnea 12/19/2017  . Swelling of limb 01/27/2017  . B12 deficiency 03/28/2016  . Vitamin D deficiency 03/28/2016  . Hyperglycemia 03/28/2016  . Multiple gastric polyps 01/08/2016  . Menorrhagia 11/13/2015  . Chronic constipation 09/23/2015  . Obesity, Class I, BMI 30.0-34.9 (see actual BMI) 06/02/2015  . Iron deficiency anemia 03/14/2014  . History of uterine fibroid 03/14/2014    Past Surgical History:  Procedure Laterality Date  . APPENDECTOMY  2012  . ESOPHAGOGASTRODUODENOSCOPY (EGD) WITH PROPOFOL N/A 01/06/2016   Procedure: ESOPHAGOGASTRODUODENOSCOPY (EGD) WITH PROPOFOL;  Surgeon: Robert Bellow, MD;  Location: [facility] END[facility];  Service: Endoscopy;  Laterality: N/A;  . ROBOTIC ASSISTED LAPAROSCOPIC CHOLECYSTECTOMY N/A 03/03/2016   Procedure: ROBOTIC ASSISTED LAPAROSCOPIC CHOLECYSTECTOMY ;  Surgeon: Clayburn Pert, MD;   Location: [facility] ORS;  Service: General;  Laterality: N/A;  . UTERINE FIBROID SURGERY  2012    Family History  Problem Relation Age of Onset  . Hypertension Mother   . Hypertension Father   . Pancreatic cancer Father 76  . Breast cancer Neg Hx   . Colon cancer Neg Hx     Social History   Socioeconomic History  . Marital status: Single    Spouse name: Not on file  . Number of children: 0  . Years of education: Not on file  . Highest education level: Bachelor's degree (e.g., BA, AB, BS)  Occupational History  . Occupation: CMA  Social Needs  . Financial resource strain: Not hard at all  . Food insecurity:    Worry: Never true    Inability: Never true  . Transportation needs:    Medical: No    Non-medical: No  Tobacco Use  . Smoking status: Never Smoker  . Smokeless tobacco: Never Used  Substance and Sexual Activity  . Alcohol use: No    Alcohol/week: 0.0 standard drinks  . Drug use: No  . Sexual activity: Not Currently    Partners: Male    Birth control/protection: Condom  Lifestyle  . Physical activity:    Days per week: 7 days    Minutes per session: 30 min  . Stress: Not at all  Relationships  . Social connections:    Talks on phone: More than three times a week    Gets together: More than three times a week  Attends religious service: More than 4 times per year    Active member of club or organization: Yes    Attends meetings of clubs or organizations: More than 4 times per year    Relationship status: Never married  . Intimate partner violence:    Fear of current or ex partner: No    Emotionally abused: No    Physically abused: No    Forced sexual activity: No  Other Topics Concern  . Not on file  Social History Narrative   Lives alone, works for [employer] at the [employer] , but switching to [employer]      Current Outpatient Medications:  .  BENZEPRO FOAMING CLOTHS 6 % MISC, Apply 1 each topically every other day., Disp: , Rfl:  .  benzonatate (TESSALON)  100 MG capsule, Take 1-2 capsules (100-200 mg total) by mouth 2 (two) times daily as needed., Disp: 40 capsule, Rfl: 0 .  chlorpheniramine-HYDROcodone (TUSSIONEX PENNKINETIC ER) 10-8 MG/5ML SUER, Take 5 mLs by mouth every 12 (twelve) hours as needed., Disp: 140 mL, Rfl: 0 .  fluticasone (FLONASE) 50 MCG/ACT nasal spray, Place 2 sprays into both nostrils daily. (Patient taking differently: Place 2 sprays into both nostrils daily as needed for allergies. ), Disp: 16 g, Rfl: 6 .  fluticasone furoate-vilanterol (BREO ELLIPTA) 100-25 MCG/INH AEPB, Inhale 1 puff into the lungs daily., Disp: 60 each, Rfl: 0 .  furosemide (LASIX) 20 MG tablet, Take 1 tablet (20 mg total) by mouth daily. (Patient taking differently: Take 20 mg by mouth daily as needed. ), Disp: 30 tablet, Rfl: 1 .  ibuprofen (ADVIL,MOTRIN) 800 MG tablet, Take 1 tablet (800 mg total) by mouth every 8 (eight) hours as needed., Disp: 60 tablet, Rfl: 3 .  iron polysaccharides (NIFEREX) 150 MG capsule, Take 150 mg by mouth every other day., Disp: , Rfl:  .  Iron-FA-B Cmp-C-Biot-Probiotic (FUSION PLUS) CAPS, Take 1 capsule by mouth daily., Disp: , Rfl:  .  linaclotide (LINZESS) 145 MCG CAPS capsule, Take 1 capsule (145 mcg total) by mouth daily before breakfast., Disp: 30 capsule, Rfl: 2 .  Multiple Vitamins-Minerals (MULTIVITAMIN ADULT) TABS, Take 1 tablet by mouth daily., Disp: 30 tablet, Rfl: 0 .  norethindrone (AYGESTIN) 5 MG tablet, Take 1 tablet (5 mg total) by mouth daily., Disp: 30 tablet, Rfl: 2 .  ondansetron (ZOFRAN ODT) 4 MG disintegrating tablet, Take 1 tablet (4 mg total) by mouth every 8 (eight) hours as needed for nausea or vomiting., Disp: 20 tablet, Rfl: 0 .  Phentermine-Topiramate (QSYMIA) 7.5-46 MG CP24, Take 1 tablet by mouth daily., Disp: 30 capsule, Rfl: 2 .  PREVIDENT 5000 BOOSTER PLUS 1.1 % PSTE, , Disp: , Rfl: 2 .  tranexamic acid (LYSTEDA) 650 MG TABS tablet, Take 2 tablets (1,300 mg total) by mouth 3 (three) times daily.  Take during menses for a maximum of five days, Disp: 30 tablet, Rfl: 11  Allergies  Allergen Reactions  . Aspirin Nausea And Vomiting  . Oxycodone-Acetaminophen Rash and Shortness Of Breath  . Lexapro [Escitalopram] Palpitations  . Tape Other (See Comments)    Burning feeling. Has left mark on skin.  Melynda Keller [Lorcaserin Hcl] Other (See Comments)    headache  . Contrave [Naltrexone-Bupropion Hcl Er]   . Dicyclomine Nausea And Vomiting  . Norethindrone Rash    I personally reviewed active problem list, medication list, allergies, family history, social history with the patient/caregiver today.   ROS  Ten systems reviewed and is negative except as  mentioned in HPI   Objective  Vitals:   11/05/18 0922  BP: 112/76  Pulse: 82  Resp: 16  Temp: 98.2 F (36.8 C)  TempSrc: Oral  SpO2: 98%  Weight: 226 lb (102.5 kg)  Height: 5\' 5"  (1.651 m)    Body mass index is 37.61 kg/m.  Physical Exam  Constitutional: Patient appears well-developed and well-nourished. Obese No distress.  HEENT: head atraumatic, normocephalic, pupils equal and reactive to light, ears : normal TM, neck supple, throat within normal limits very swollen and boggy left nostril Cardiovascular: Normal rate, regular rhythm and normal heart sounds.  No murmur heard. No BLE edema. Pulmonary/Chest: Effort normal and breath sounds normal. No respiratory distress. Abdominal: Soft.  There is no tenderness. Psychiatric: Patient has a normal mood and affect. behavior is normal. Judgment and thought content normal.  Recent Results (from the past 2160 hour(s))  POCT Influenza A/B     Status: Normal   Collection Time: 08/07/18 11:59 AM  Result Value Ref Range   Influenza A, POC Negative Negative   Influenza B, POC Negative Negative  Hold Tube- Blood Bank     Status: None   Collection Time: 09/19/18  8:38 AM  Result Value Ref Range   Blood Bank Specimen SAMPLE AVAILABLE FOR TESTING    Sample Expiration       09/22/2018 Performed at [facility], [address] [place], [place] [address]   Iron and TIBC     Status: Abnormal   Collection Time: 09/19/18  8:38 AM  Result Value Ref Range   Iron 28 28 - 170 ug/dL   TIBC 457 (H) 250 - 450 ug/dL   Saturation Ratios 6 (L) 10.4 - 31.8 %   UIBC 429 ug/dL    Comment: Performed at [facility], [address] [place], [place] [address]  Ferritin     Status: Abnormal   Collection Time: 09/19/18  8:38 AM  Result Value Ref Range   Ferritin 5 (L) 11 - 307 ng/mL    Comment: Performed at [facility], [address] [place], [place] [address]  CBC with Differential/Platelet     Status: Abnormal   Collection Time: 09/19/18  8:38 AM  Result Value Ref Range   WBC 5.7 4.0 - 10.5 K/uL   RBC 4.00 3.87 - 5.11 MIL/uL   Hemoglobin 9.0 (L) 12.0 - 15.0 g/dL   HCT 29.7 (L) 36.0 - 46.0 %   MCV 74.3 (L) 80.0 - 100.0 fL   MCH 22.5 (L) 26.0 - 34.0 pg   MCHC 30.3 30.0 - 36.0 g/dL   RDW 15.7 (H) 11.5 - 15.5 %   Platelets 524 (H) 150 - 400 K/uL   nRBC 0.0 0.0 - 0.2 %   Neutrophils Relative % 49 %   Neutro Abs 2.8 1.7 - 7.7 K/uL   Lymphocytes Relative 40 %   Lymphs Abs 2.3 0.7 - 4.0 K/uL   Monocytes Relative 9 %   Monocytes Absolute 0.5 0.1 - 1.0 K/uL   Eosinophils Relative 1 %   Eosinophils Absolute 0.0 0.0 - 0.5 K/uL   Basophils Relative 0 %   Basophils Absolute 0.0 0.0 - 0.1 K/uL   Immature Granulocytes 1 %   Abs Immature Granulocytes 0.03 0.00 - 0.07 K/uL    Comment: Performed at [facility], [address] [place], [place] [address]      PHQ2/9: Depression screen [facility] 2/9 10/17/2018 08/07/2018 07/17/2018 03/23/2018 12/14/2017  Decreased Interest 0 0  0 1 0  Down, Depressed, Hopeless 0 0 0 1 0  PHQ - 2 Score 0 0 0 2 0  Altered sleeping 0 1 1 3  -  Tired, decreased energy 2 1 1 3  -  Change in appetite 0 0 1 3 -  Feeling bad or failure about yourself  0 0 0 0 -  Trouble concentrating 0 0 0 2 -  Moving slowly or  fidgety/restless 0 0 0 0 -  Suicidal thoughts 0 0 0 0 -  PHQ-9 Score 2 2 3 13  -  Difficult doing work/chores Not difficult at all Not difficult at all Not difficult at all Very difficult -     Fall Risk: Fall Risk  10/17/2018 08/07/2018 07/17/2018 04/20/2018 03/23/2018  Falls in the past year? 0 0 0 No No  Number falls in past yr: - 0 0 - -  Injury with Fall? - 0 - - -     Assessment & Plan  1. Bronchitis  - benzonatate (TESSALON) 100 MG capsule; Take 1-2 capsules (100-200 mg total) by mouth 2 (two) times daily as needed.  Dispense: 40 capsule; Refill: 0  2. Non-intractable vomiting with nausea, unspecified vomiting type  - ondansetron (ZOFRAN ODT) 4 MG disintegrating tablet; Take 1 tablet (4 mg total) by mouth every 8 (eight) hours as needed for nausea or vomiting.  Dispense: 20 tablet; Refill: 0

## 2018-11-07 ENCOUNTER — Ambulatory Visit (INDEPENDENT_AMBULATORY_CARE_PROVIDER_SITE_OTHER): Payer: No Typology Code available for payment source

## 2018-11-07 ENCOUNTER — Encounter: Payer: Self-pay | Admitting: Obstetrics and Gynecology

## 2018-11-07 ENCOUNTER — Other Ambulatory Visit: Payer: Self-pay

## 2018-11-07 ENCOUNTER — Ambulatory Visit (INDEPENDENT_AMBULATORY_CARE_PROVIDER_SITE_OTHER): Payer: No Typology Code available for payment source | Admitting: Obstetrics and Gynecology

## 2018-11-07 VITALS — BP 131/83 | HR 69 | Ht 65.0 in | Wt 224.2 lb

## 2018-11-07 DIAGNOSIS — N920 Excessive and frequent menstruation with regular cycle: Secondary | ICD-10-CM | POA: Diagnosis not present

## 2018-11-07 DIAGNOSIS — D259 Leiomyoma of uterus, unspecified: Secondary | ICD-10-CM

## 2018-11-07 DIAGNOSIS — D251 Intramural leiomyoma of uterus: Secondary | ICD-10-CM

## 2018-11-07 DIAGNOSIS — D250 Submucous leiomyoma of uterus: Secondary | ICD-10-CM

## 2018-11-07 DIAGNOSIS — N946 Dysmenorrhea, unspecified: Secondary | ICD-10-CM

## 2018-11-07 NOTE — Progress Notes (Signed)
Pt is present today follow an u/s to get results. Pt stated that she was doing well no problems.

## 2018-11-07 NOTE — Progress Notes (Signed)
GYNECOLOGY PROGRESS NOTE  Subjective:    Patient ID: Sharon Herring, female    DOB: 07-14-1976, 43 y.o.   MRN: [id number]  HPI  Patient is a 43 y.o. G0P0000 female who presents for follow up after ultrasound to discuss results.  She has a history of menorrhagia and fibroid uterus.  She was initiated on Aygestin last visit due to her complaints of heavy bleeding.  She notes that her most recent episode of bleeding was shorter (6 days instead of the usual 9-10), and was lighter in flow overall (although the first 3 days were still heavy), with less clotting and less dysmenorrhea.   The following portions of the patient's history were reviewed and updated as appropriate: allergies, current medications, past family history, past medical history, past social history, past surgical history and problem list.  Review of Systems Pertinent items noted in HPI and remainder of comprehensive ROS otherwise negative.   Objective:   Blood pressure 131/83, pulse 69, height 5\' 5"  (1.651 m), weight 224 lb 3.2 oz (101.7 kg), last menstrual period 10/30/2018. General appearance: alert and no distress Remainder of exam deferred.   Imaging:  Patient Name: Sharon Herring DOB: 1975-12-26 MRN: [id number]    ULTRASOUND REPORT  Location: [facility]  Date of Service: 11/07/2018      Indications:Fibroids Findings:  The uterus is anteverted and measures 127.9 mm. Echo texture is homogenous with evidence of focal masses. Within the uterus are multiple suspected fibroids measuring: Fibroid 1:35.9 x 35.9 mm posterior Fibroid 2:42.6 x42.6 mm anterior Fibroid 3: 36.0 x 36.0 mm fundal The Endometrium measures 8  mm.  Right Ovary measures 1.39 x 1.53 x .9 cm. It is normal in appearance.  Left Ovary measures 2.38 x 1.41 x .1.58 cm. It is normal in appearance. Survey of the adnexa demonstrates no adnexal masses. There is no free fluid in the cul de sac.   Impression: The uterus is anteverted and  measures 127.9 mm. Echo texture is homogenous with evidence of focal masses. Within the uterus are multiple suspected fibroids measuring: Fibroid 1:35.9 x 35.9 mm posterior Fibroid 2:42.6 x42.6 mm anterior Fibroid 3: 36.0 x 36.0 mm fundal   Recommendations: 1.Clinical correlation with the patient's History and Physical Exam.   Lillia Dallas  RDMS     ULTRASOUND REPORT  Location: [facility] Date of Service: 02/13/17   Indications:fibroids Findings:  The uterus measures 11.3 x 7.5 x 7.6 cm. Echo texture is heterogenous with evidence of focal masses. Within the uterus are multiple suspected fibroids measuring: Fibroid 1:Anterior fundal submucosal 2.8 x 3.8 x 2.8 cm Fibroid 2:Posterior Fundal submucosal 3.6 x 3.9 x 4.3 cm Fibroid 3:fundal intramural 4.4 x 3.4 x 3.7 cm The Endometrium measures 6.3 mm.  Right Ovary measures 3 x 1.6 x 1.6 cm. It is normal in appearance. Left Ovary measures 3.2 x 2.1 x 2.4 cm. It is normal appearance. Survey of the adnexa demonstrates no adnexal masses. There is no free fluid in the cul de sac.  Impression: 1. Fibroid uterus  Recommendations: 1.Clinical correlation with the patient's History and Physical Exam.   [name]  The ultrasound images and findings were reviewed by me and I agree with the above report.  Finis Bud, M.D. 02/23/2017 1:04 PM    Assessment:   Fibroid uterus Menorrhagia Dysmenorrhea  Plan:   Reviewed ultrasound findings with patient.  Note slight enlargement in several of the fibroids, but overall no significant changes. She  is overall noting improvement with her menorrhagia and dysmenorrhea with use of the Aygestin. Reviewed all treatment options again with patient based on her ultrasound.  She still notes that she would desire a myomectomy.  Discussed with her that with the current health environment (Cor[place] virus pandemic) that all elective surgeries are on hold until at  least May. Patient notes understanding. Will continue use of Aygestin for now, and f/u in May.    A total of 15 minutes were spent face-to-face with the patient during this encounter and over half of that time dealt with counseling and coordination of care.   Rubie Maid, MD [facility]

## 2018-11-12 ENCOUNTER — Ambulatory Visit: Payer: No Typology Code available for payment source | Admitting: Family Medicine

## 2018-12-18 ENCOUNTER — Telehealth: Payer: Self-pay

## 2018-12-18 ENCOUNTER — Other Ambulatory Visit: Payer: Self-pay

## 2018-12-18 ENCOUNTER — Inpatient Hospital Stay: Payer: No Typology Code available for payment source | Attending: Hematology and Oncology

## 2018-12-18 DIAGNOSIS — D500 Iron deficiency anemia secondary to blood loss (chronic): Secondary | ICD-10-CM | POA: Diagnosis present

## 2018-12-18 DIAGNOSIS — N920 Excessive and frequent menstruation with regular cycle: Secondary | ICD-10-CM | POA: Insufficient documentation

## 2018-12-18 DIAGNOSIS — Z79899 Other long term (current) drug therapy: Secondary | ICD-10-CM | POA: Insufficient documentation

## 2018-12-18 LAB — CBC WITH DIFFERENTIAL/PLATELET
Abs Immature Granulocytes: 0.05 10*3/uL (ref 0.00–0.07)
Basophils Absolute: 0 10*3/uL (ref 0.0–0.1)
Basophils Relative: 1 %
Eosinophils Absolute: 0.1 10*3/uL (ref 0.0–0.5)
Eosinophils Relative: 1 %
HCT: 29.4 % — ABNORMAL LOW (ref 36.0–46.0)
Hemoglobin: 8.9 g/dL — ABNORMAL LOW (ref 12.0–15.0)
Immature Granulocytes: 1 %
Lymphocytes Relative: 41 %
Lymphs Abs: 2.6 10*3/uL (ref 0.7–4.0)
MCH: 22.5 pg — ABNORMAL LOW (ref 26.0–34.0)
MCHC: 30.3 g/dL (ref 30.0–36.0)
MCV: 74.4 fL — ABNORMAL LOW (ref 80.0–100.0)
Monocytes Absolute: 0.6 10*3/uL (ref 0.1–1.0)
Monocytes Relative: 10 %
Neutro Abs: 3 10*3/uL (ref 1.7–7.7)
Neutrophils Relative %: 46 %
Platelets: 505 10*3/uL — ABNORMAL HIGH (ref 150–400)
RBC: 3.95 MIL/uL (ref 3.87–5.11)
RDW: 19.2 % — ABNORMAL HIGH (ref 11.5–15.5)
WBC: 6.3 10*3/uL (ref 4.0–10.5)
nRBC: 0 % (ref 0.0–0.2)

## 2018-12-18 LAB — SEDIMENTATION RATE: Sed Rate: 36 mm/hr — ABNORMAL HIGH (ref 0–20)

## 2018-12-18 LAB — FERRITIN: Ferritin: 6 ng/mL — ABNORMAL LOW (ref 11–307)

## 2018-12-18 NOTE — Telephone Encounter (Signed)
-----   Message from Lequita Asal, MD sent at 12/18/2018  1:39 PM EDT -----  She needs IV iron tomorrow.  M ----- Message ----- From: Vito Berger, Neche Sent: 12/18/2018  12:20 PM EDT To: Arlan Organ, RN, #  Can you review the labs for ms Barnie Alderman to see if she will need to come in for a visit for IV feraheme or keep the doximity visit for tomorrow. Please and thanks

## 2018-12-18 NOTE — Telephone Encounter (Signed)
Spoke with the patient to inform her that she will need IV Iron and i have sent a PA for venofer to Sharon Herring. The patient was agreeable and understanding.

## 2018-12-19 ENCOUNTER — Telehealth: Payer: Self-pay

## 2018-12-19 ENCOUNTER — Ambulatory Visit: Payer: Self-pay | Admitting: Hematology and Oncology

## 2018-12-19 ENCOUNTER — Ambulatory Visit: Payer: Self-pay

## 2018-12-19 NOTE — Progress Notes (Deleted)
[facility]  [address], [address] [place], [place] [address] Phone: [phone]  Fax: [phone]   Clinic Day:  12/19/2018  Referring physician: Steele Sizer, MD  Chief Complaint: Sharon Herring is a 43 y.o. female with iron deficiency anemia who is seen for reassessment.   HPI: The patient was last seen in the medical hematology clinic on 09/19/2018. At that time, she reamined fatigued.  Menses was heavy.  She had ice pica.  Hemoglobin was 8.3.  Ferritin was 5.  She received Feraheme on 09/19/2018.   She met with Sharon Herring, gynecologist, on 10/18/2018 and 11/07/2018.  Notes reviewed.  Transvaginal US on 11/07/2018 revealed three fibroids measuring between 35.9 - 42.40m. She noted improvement with her menorrhagia and dysmenorrhea with use of the Aygestin.  Treatment options reviewed.  She desired a myomectomy.  Given the current health environment (Cor[place] virus pandemic), elective surgeries are on hold until at least 12/2018.  Plan to continue Aygestin with f/u in May.   CBC on 12/18/2018 revealed a hemoglobin 8.9, hematocrit 29.4, MCV 74.4, RDW 19.2, and platelets 505,000.   Ferritin was 6.  Sed rate was 36.  In the interim, ***   Past Medical History:  Diagnosis Date  . Anemia   . Dizziness   . Fibroid uterus   . GERD (gastroesophageal reflux disease)   . IBS (irritable bowel syndrome)   . Migraine   . Polyp of stomach 01/06/2016   Stomach polyp, pyloric, consistent with prolapse.     Past Surgical History:  Procedure Laterality Date  . APPENDECTOMY  2012  . ESOPHAGOGASTRODUODENOSCOPY (EGD) WITH PROPOFOL N/A 01/06/2016   Procedure: ESOPHAGOGASTRODUODENOSCOPY (EGD) WITH PROPOFOL;  Surgeon: JRobert Bellow MD;  Location: [facility];  Service: Endoscopy;  Laterality: N/A;  . ROBOTIC ASSISTED LAPAROSCOPIC CHOLECYSTECTOMY N/A 03/03/2016   Procedure: ROBOTIC ASSISTED LAPAROSCOPIC CHOLECYSTECTOMY ;  Surgeon: CClayburn Pert MD;  Location:  [facility] ORS;  Service: General;  Laterality: N/A;  . UTERINE FIBROID SURGERY  2012    Family History  Problem Relation Age of Onset  . Hypertension Mother   . Hypertension Father   . Pancreatic cancer Father 551 . Breast cancer Neg Hx   . Colon cancer Neg Hx     Social History:  reports that she has never smoked. She has never used smokeless tobacco. She reports that she does not drink alcohol or use drugs. She denies any exposure to radiation or toxins. 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  Allergies:  Allergies  Allergen Reactions  . Aspirin Nausea And Vomiting  . Oxycodone-Acetaminophen Rash and Shortness Of Breath  . Lexapro [Escitalopram] Palpitations  . Tape Other (See Comments)    Burning feeling. Has left mark on skin.  .Melynda Keller[Lorcaserin Hcl] Other (See Comments)    headache  . Contrave [Naltrexone-Bupropion Hcl Er]   . Dicyclomine Nausea And Vomiting  . Norethindrone Rash    Current Medications: Current Outpatient Medications  Medication Sig Dispense Refill  . BENZEPRO FOAMING CLOTHS 6 % MISC Apply 1 each topically every other day.    . benzonatate (TESSALON) 100 MG capsule Take 1-2 capsules (100-200 mg total) by mouth 2 (two) times daily as needed. 40 capsule 0  . chlorpheniramine-HYDROcodone (TUSSIONEX PENNKINETIC ER) 10-8 MG/5ML SUER Take 5 mLs by mouth every 12 (twelve) hours as needed. 140 mL 0  . fluticasone (FLONASE) 50 MCG/ACT nasal spray Place 2 sprays into both  nostrils daily. (Patient taking differently: Place 2 sprays into both nostrils daily as needed for allergies. ) 16 g 6  . fluticasone furoate-vilanterol (BREO ELLIPTA) 100-25 MCG/INH AEPB Inhale 1 puff into the lungs daily. 60 each 0  . furosemide (LASIX) 20 MG tablet Take 1 tablet (20 mg total) by mouth daily. (Patient taking differently: Take 20 mg by mouth daily as needed. ) 30 tablet 1  . ibuprofen (ADVIL,MOTRIN) 800 MG tablet Take  1 tablet (800 mg total) by mouth every 8 (eight) hours as needed. 60 tablet 3  . iron polysaccharides (NIFEREX) 150 MG capsule Take 150 mg by mouth every other day.    . Iron-FA-B Cmp-C-Biot-Probiotic (FUSION PLUS) CAPS Take 1 capsule by mouth daily.    Marland Kitchen linaclotide (LINZESS) 145 MCG CAPS capsule Take 1 capsule (145 mcg total) by mouth daily before breakfast. 30 capsule 2  . Multiple Vitamins-Minerals (MULTIVITAMIN ADULT) TABS Take 1 tablet by mouth daily. 30 tablet 0  . norethindrone (AYGESTIN) 5 MG tablet Take 1 tablet (5 mg total) by mouth daily. 30 tablet 2  . ondansetron (ZOFRAN ODT) 4 MG disintegrating tablet Take 1 tablet (4 mg total) by mouth every 8 (eight) hours as needed for nausea or vomiting. 20 tablet 0  . Phentermine-Topiramate (QSYMIA) 7.5-46 MG CP24 Take 1 tablet by mouth daily. 30 capsule 2  . PREVIDENT 5000 BOOSTER PLUS 1.1 % PSTE   2   No current facility-administered medications for this visit.     Review of Systems: Review of Systems  Constitutional: Positive for malaise/fatigue (severe). Negative for chills, fever and weight loss.       Up 14lbs.  HENT: Negative.   Eyes: Negative.   Respiratory: Positive for shortness of breath (on exertion).   Gastrointestinal:       Ice pica. Eating well.  Genitourinary:       Menses continue to be heavy  Musculoskeletal: Negative.   Skin: Negative.   Neurological:       Restless legs.  Endo/Heme/Allergies: Negative.   Psychiatric/Behavioral: Negative.   Performance status (ECOG): 0-1  Physical Exam: Physical Exam  Constitutional: She is oriented to person, place, and time. She appears well-developed and well-nourished.  HENT:  Head: Normocephalic and atraumatic.  Mouth/Throat: Oropharynx is clear and moist.  Black hair.  Eyes: Pupils are equal, round, and reactive to light. Conjunctivae and EOM are normal.  Brown eyes  Neck: Normal range of motion. Neck supple.  Cardiovascular: Normal rate, regular rhythm and  normal heart sounds.  Pulmonary/Chest: Effort normal and breath sounds normal.  Abdominal: Soft. Bowel sounds are normal.  Neurological: She is alert and oriented to person, place, and time. She has normal reflexes.  Skin: Skin is warm and dry.  Psychiatric: She has a normal mood and affect. Her behavior is normal. Judgment and thought content normal.     Appointment on 12/18/2018  Component Date Value Ref Range Status  . Sed Rate 12/18/2018 36* 0 - 20 mm/hr Final   Performed at [facility], [address] [place], [place] [address]  . Ferritin 12/18/2018 6* 11 - 307 ng/mL Final   Performed at [facility], [address] [place], [place] [address]  . WBC 12/18/2018 6.3  4.0 - 10.5 K/uL Final  . RBC 12/18/2018 3.95  3.87 - 5.11 MIL/uL Final  . Hemoglobin 12/18/2018 8.9* 12.0 - 15.0 g/dL Final  . HCT 12/18/2018 29.4* 36.0 - 46.0 % Final  . MCV 12/18/2018 74.4* 80.0 -  100.0 fL Final  . MCH 12/18/2018 22.5* 26.0 - 34.0 pg Final  . MCHC 12/18/2018 30.3  30.0 - 36.0 g/dL Final  . RDW 12/18/2018 19.2* 11.5 - 15.5 % Final  . Platelets 12/18/2018 505* 150 - 400 K/uL Final  . nRBC 12/18/2018 0.0  0.0 - 0.2 % Final  . Neutrophils Relative % 12/18/2018 46  % Final  . Neutro Abs 12/18/2018 3.0  1.7 - 7.7 K/uL Final  . Lymphocytes Relative 12/18/2018 41  % Final  . Lymphs Abs 12/18/2018 2.6  0.7 - 4.0 K/uL Final  . Monocytes Relative 12/18/2018 10  % Final  . Monocytes Absolute 12/18/2018 0.6  0.1 - 1.0 K/uL Final  . Eosinophils Relative 12/18/2018 1  % Final  . Eosinophils Absolute 12/18/2018 0.1  0.0 - 0.5 K/uL Final  . Basophils Relative 12/18/2018 1  % Final  . Basophils Absolute 12/18/2018 0.0  0.0 - 0.1 K/uL Final  . Immature Granulocytes 12/18/2018 1  % Final  . Abs Immature Granulocytes 12/18/2018 0.05  0.00 - 0.07 K/uL Final   Performed at [facility], [address] [place], [place] [address]    Assessment:  BRITTNAE ASCHENBRENNER is a 43 y.o.  female withiron deficiency anemiasecondary to heavy menses. She hasa history of on and off anemia for several years (before 02/2014). Dietis fairly good. She denies any melena, hematochezia, or hematuria. She has hadice picax 2 months. She has a history of unresponsiveness to oral iron (4 month trial in 2015).  Labs on 11/30/2017revealed a hematocrit of 30.9, hemoglobin 9.3, MCV 73.4, platelets 675,000, white count 5300 with an ANC of 2650. Creatinine was0.7. Liver function tests were normal. Iron studies included a ferritin of 6, iron saturation 6% and TIBC of 440. HIV testing was negative.  Labs on 08/25/2016 revealed a hematocrit of 28.3, hemoglobin 9.2, and MCV 69.6.  Ferritin was 5.  She received Feraheme 510 mg IV on 08/29/2016 and 09/05/2016, 04/13/2018, 04/20/2018, and 09/19/2018.  Ferritin goal is 100.  Ferritin has been followed:  18 on 12/02/2015, 6 on 07/21/2016, 5 on 08/25/2016, 214 on 09/21/2016, 97 on 10/10/2016, 21 on 12/08/2016, 12 on 02/09/2017, 4 on 03/23/2018, 4 on 04/13/2018, 9 on 07/10/2018, 6 on 08/03/2018, and 5 on 09/19/2018, and 6 on 12/18/2018.  She has B12 deficiency. She received B12 shots x 2-3 months. B12 was 462 on 07/21/2016 and 434 on 12/14/2017.  Symptomatically, ***  Plan: 1.   Review labs from 12/18/2018.  2.  Iron deficiency anemia             Etiology felt secondary to heavy menses (somewhat improved).             Discus reinstitution of IV iron.             Feraheme today and in 1 week. 3.  RTC in 3 months for MD assessment, labs (CBC with diff, ferritin, sed rate), and +/- Feraheme  I discussed the assessment and treatment plan with the patient.  The patient was provided an opportunity to ask questions and all were answered.  The patient agreed with the plan and demonstrated an understanding of the instructions.  The patient was advised to call back if the symptoms worsen or if the condition fails to improve as anticipated.  I  provided *** minutes of face-to-face time during this this encounter and > 50% was spent counseling as documented under my assessment and plan.    Lequita Asal, MD, PhD  12/19/2018, 12:26 PM  I, Molly Dorshimer, am acting as Education administrator for [employer]. Mike Gip, MD, PhD.  {Add scribe attestation statement}

## 2018-12-19 NOTE — Telephone Encounter (Signed)
Spoke with Ms Sharon Herring to inform her that she has been approved for the [facility] only for tomorrow. The patient was understanding and agreeable.

## 2018-12-20 ENCOUNTER — Other Ambulatory Visit: Payer: Self-pay

## 2018-12-20 ENCOUNTER — Inpatient Hospital Stay (HOSPITAL_BASED_OUTPATIENT_CLINIC_OR_DEPARTMENT_OTHER): Payer: No Typology Code available for payment source | Admitting: Hematology and Oncology

## 2018-12-20 ENCOUNTER — Encounter: Payer: Self-pay | Admitting: Hematology and Oncology

## 2018-12-20 ENCOUNTER — Inpatient Hospital Stay: Payer: No Typology Code available for payment source

## 2018-12-20 VITALS — BP 121/81 | HR 71 | Resp 18

## 2018-12-20 VITALS — BP 117/72 | HR 78 | Temp 98.6°F | Resp 18 | Ht 65.0 in | Wt 227.8 lb

## 2018-12-20 DIAGNOSIS — N920 Excessive and frequent menstruation with regular cycle: Secondary | ICD-10-CM

## 2018-12-20 DIAGNOSIS — D509 Iron deficiency anemia, unspecified: Secondary | ICD-10-CM

## 2018-12-20 DIAGNOSIS — E538 Deficiency of other specified B group vitamins: Secondary | ICD-10-CM

## 2018-12-20 DIAGNOSIS — D500 Iron deficiency anemia secondary to blood loss (chronic): Secondary | ICD-10-CM

## 2018-12-20 MED ORDER — SODIUM CHLORIDE 0.9% FLUSH
3.0000 mL | Freq: Once | INTRAVENOUS | Status: DC | PRN
  Filled 2018-12-20: qty 3

## 2018-12-20 MED ORDER — SODIUM CHLORIDE 0.9 % IV SOLN
Freq: Once | INTRAVENOUS | Status: AC
  Administered 2018-12-20: 15:00:00 via INTRAVENOUS
  Filled 2018-12-20: qty 250

## 2018-12-20 MED ORDER — SODIUM CHLORIDE 0.9% FLUSH
10.0000 mL | Freq: Once | INTRAVENOUS | Status: DC | PRN
  Filled 2018-12-20: qty 10

## 2018-12-20 MED ORDER — SODIUM CHLORIDE 0.9 % IV SOLN
510.0000 mg | Freq: Once | INTRAVENOUS | Status: AC
  Administered 2018-12-20: 510 mg via INTRAVENOUS
  Filled 2018-12-20: qty 17

## 2018-12-20 NOTE — Progress Notes (Signed)
[facility]  [address] [place], [place] [address] Phone: [phone]  Fax: [phone]   Clinic Day:  12/20/2018  Referring physician: Steele Sizer, MD  Chief Complaint: Sharon Herring is a 43 y.o. female with iron deficiency anemia who is seen for reassessment.   HPI: The patient was last seen in the medical hematology clinic on 09/19/2018. At that time, she remained fatigued.  Menses was heavy.  She had ice pica.  Hemoglobin was 8.3.  Ferritin was 5.  She received Feraheme on 09/19/2018.   She met with Rubie Maid, gynecologist, on 10/18/2018 and 11/07/2018.  Notes reviewed.  Transvaginal US on 11/07/2018 revealed three fibroids measuring between 35.9 - 42.29m. She noted improvement with her menorrhagia and dysmenorrhea with use of the Aygestin.  Treatment options reviewed.  She desired a myomectomy.  Given the current health environment (Coronavirus pandemic), elective surgeries are on hold until at least 12/2018.  Plan to continue Aygestin with f/u in May.   CBC on 12/18/2018 revealed a hemoglobin 8.9, hematocrit 29.4, MCV 74.4, RDW 19.2, and platelets 505,000.   Ferritin was 6.  Sed rate was 36.  During the interim, she has been taking ibuprofen and aygestin, with lighter menstrual cycles. Cycles now last 5 days, as opposed to 8 prior to taking aygestin. She denies any dizziness, SOB, chest pain. She reports ice pica, slightly improved since last clinic visit.   She has been eating red meat and beets. She takes an oral B12 supplement, as her last B12 injection was in 09/2018.    Past Medical History:  Diagnosis Date   Anemia    Dizziness    Fibroid uterus    GERD (gastroesophageal reflux disease)    IBS (irritable bowel syndrome)    Migraine    Polyp of stomach 01/06/2016   Stomach polyp, pyloric, consistent with prolapse.     Past Surgical History:  Procedure Laterality Date   APPENDECTOMY  2012    ESOPHAGOGASTRODUODENOSCOPY (EGD) WITH PROPOFOL N/A 01/06/2016   Procedure: ESOPHAGOGASTRODUODENOSCOPY (EGD) WITH PROPOFOL;  Surgeon: JRobert Bellow MD;  Location: [facility] ENDOSCOPY;  Service: Endoscopy;  Laterality: N/A;   ROBOTIC ASSISTED LAPAROSCOPIC CHOLECYSTECTOMY N/A 03/03/2016   Procedure: ROBOTIC ASSISTED LAPAROSCOPIC CHOLECYSTECTOMY ;  Surgeon: CClayburn Pert MD;  Location: [facility] ORS;  Service: General;  Laterality: N/A;   UTERINE FIBROID SURGERY  2012    Family History  Problem Relation Age of Onset   Hypertension Mother    Hypertension Father    Pancreatic cancer Father 580  Breast cancer Neg Hx    Colon cancer Neg Hx     Social History:  reports that she has never smoked. She has never used smokeless tobacco. She reports that she does not drink alcohol or use drugs. She denies any exposure to radiation or toxins. 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 She is working at home during the pandemic.  Allergies:  Allergies  Allergen Reactions   Aspirin Nausea And Vomiting   Oxycodone-Acetaminophen Rash and Shortness Of Breath   Lexapro [Escitalopram] Palpitations   Tape Other (See Comments)    Burning feeling. Has left mark on skin.   Belviq [Lorcaserin Hcl] Other (See Comments)    headache   Contrave [Naltrexone-Bupropion Hcl Er]    Dicyclomine Nausea And Vomiting   Norethindrone Rash    Current Medications: Current Outpatient Medications  Medication Sig Dispense Refill   BENZEPRO FOAMING CLOTHS  6 % MISC Apply 1 each topically every other day.     fluticasone furoate-vilanterol (BREO ELLIPTA) 100-25 MCG/INH AEPB Inhale 1 puff into the lungs daily. 60 each 0   furosemide (LASIX) 20 MG tablet Take 1 tablet (20 mg total) by mouth daily. (Patient taking differently: Take 20 mg by mouth daily as needed. ) 30 tablet 1   iron polysaccharides (NIFEREX) 150 MG capsule Take 150 mg by mouth every  other day.     Iron-FA-B Cmp-C-Biot-Probiotic (FUSION PLUS) CAPS Take 1 capsule by mouth daily.     linaclotide (LINZESS) 145 MCG CAPS capsule Take 1 capsule (145 mcg total) by mouth daily before breakfast. 30 capsule 2   Multiple Vitamins-Minerals (MULTIVITAMIN ADULT) TABS Take 1 tablet by mouth daily. 30 tablet 0   norethindrone (AYGESTIN) 5 MG tablet Take 1 tablet (5 mg total) by mouth daily. 30 tablet 2   Phentermine-Topiramate (QSYMIA) 7.5-46 MG CP24 Take 1 tablet by mouth daily. 30 capsule 2   PREVIDENT 5000 BOOSTER PLUS 1.1 % PSTE   2   benzonatate (TESSALON) 100 MG capsule Take 1-2 capsules (100-200 mg total) by mouth 2 (two) times daily as needed. (Patient not taking: Reported on 12/20/2018) 40 capsule 0   chlorpheniramine-HYDROcodone (TUSSIONEX PENNKINETIC ER) 10-8 MG/5ML SUER Take 5 mLs by mouth every 12 (twelve) hours as needed. (Patient not taking: Reported on 12/20/2018) 140 mL 0   fluticasone (FLONASE) 50 MCG/ACT nasal spray Place 2 sprays into both nostrils daily. (Patient not taking: Reported on 12/20/2018) 16 g 6   ibuprofen (ADVIL,MOTRIN) 800 MG tablet Take 1 tablet (800 mg total) by mouth every 8 (eight) hours as needed. (Patient not taking: Reported on 12/20/2018) 60 tablet 3   ondansetron (ZOFRAN ODT) 4 MG disintegrating tablet Take 1 tablet (4 mg total) by mouth every 8 (eight) hours as needed for nausea or vomiting. (Patient not taking: Reported on 12/20/2018) 20 tablet 0   No current facility-administered medications for this visit.     Review of Systems  Constitutional: Positive for malaise/fatigue (severe). Negative for chills, fever and weight loss (up 3 pounds).       "I feel like I could sleep all day."  HENT: Negative.  Negative for congestion, ear pain, hearing loss, nosebleeds, sinus pain and sore throat.   Eyes: Negative.  Negative for blurred vision, double vision and photophobia.  Respiratory: Negative.  Negative for cough, shortness of breath and  wheezing.   Cardiovascular: Negative for chest pain, palpitations, leg swelling and PND.  Gastrointestinal: Negative.  Negative for abdominal pain, blood in stool, constipation, diarrhea, heartburn, nausea and vomiting.       Ice pica. Eating moderately well.  Genitourinary: Negative.  Negative for dysuria, frequency, hematuria and urgency.       Menses continue to be heavy, although mildly improved  Musculoskeletal: Negative.  Negative for joint pain, myalgias and neck pain.  Skin: Negative.  Negative for itching and rash.  Neurological: Positive for tingling (right foot). Negative for dizziness, sensory change, weakness and headaches.       Restless legs, improved.  Endo/Heme/Allergies: Negative.  Does not bruise/bleed easily.  Psychiatric/Behavioral: Negative for depression and memory loss. The patient is not nervous/anxious and does not have insomnia.    Performance status (ECOG): 1  Vitals: Blood pressure 117/72, pulse 78, temperature 98.6 F (37 C), temperature source Tympanic, resp. rate 18, height '5\' 5"'  (1.651 m), weight 227 lb 13.5 oz (103.4 kg), SpO2 100 %.   Physical Exam  Constitutional: She is oriented to person, place, and time. She appears well-developed and well-nourished. No distress.  HENT:  Head: Normocephalic and atraumatic.  Mouth/Throat: Oropharynx is clear and moist.  Black hair. Wearing mask.  Eyes: Pupils are equal, round, and reactive to light. Conjunctivae and EOM are normal.  Brown eyes.  Neck: Normal range of motion. Neck supple.  Cardiovascular: Normal rate, regular rhythm and normal heart sounds. Exam reveals no gallop and no friction rub.  No murmur heard. Pulmonary/Chest: Effort normal and breath sounds normal. No respiratory distress. She has no wheezes. She has no rales.  Abdominal: Soft. Bowel sounds are normal. She exhibits no distension. There is no abdominal tenderness. There is no rebound and no guarding.  Musculoskeletal:        General: No  edema.  Lymphadenopathy:    She has no cervical adenopathy.    She has no axillary adenopathy.       Right: No supraclavicular adenopathy present.       Left: No supraclavicular adenopathy present.  Neurological: She is alert and oriented to person, place, and time. She has normal reflexes.  Skin: Skin is warm and dry. She is not diaphoretic.  Psychiatric: She has a normal mood and affect. Her behavior is normal. Judgment and thought content normal.  Nursing note and vitals reviewed.    Appointment on 12/18/2018  Component Date Value Ref Range Status   Sed Rate 12/18/2018 36* 0 - 20 mm/hr Final   Performed at [facility], [address] [place], [place] [address]   Ferritin 12/18/2018 6* 11 - 307 ng/mL Final   Performed at [facility], [address] [place], [place] [address]   WBC 12/18/2018 6.3  4.0 - 10.5 K/uL Final   RBC 12/18/2018 3.95  3.87 - 5.11 MIL/uL Final   Hemoglobin 12/18/2018 8.9* 12.0 - 15.0 g/dL Final   HCT 12/18/2018 29.4* 36.0 - 46.0 % Final   MCV 12/18/2018 74.4* 80.0 - 100.0 fL Final   MCH 12/18/2018 22.5* 26.0 - 34.0 pg Final   MCHC 12/18/2018 30.3  30.0 - 36.0 g/dL Final   RDW 12/18/2018 19.2* 11.5 - 15.5 % Final   Platelets 12/18/2018 505* 150 - 400 K/uL Final   nRBC 12/18/2018 0.0  0.0 - 0.2 % Final   Neutrophils Relative % 12/18/2018 46  % Final   Neutro Abs 12/18/2018 3.0  1.7 - 7.7 K/uL Final   Lymphocytes Relative 12/18/2018 41  % Final   Lymphs Abs 12/18/2018 2.6  0.7 - 4.0 K/uL Final   Monocytes Relative 12/18/2018 10  % Final   Monocytes Absolute 12/18/2018 0.6  0.1 - 1.0 K/uL Final   Eosinophils Relative 12/18/2018 1  % Final   Eosinophils Absolute 12/18/2018 0.1  0.0 - 0.5 K/uL Final   Basophils Relative 12/18/2018 1  % Final   Basophils Absolute 12/18/2018 0.0  0.0 - 0.1 K/uL Final   Immature Granulocytes 12/18/2018 1  % Final   Abs Immature Granulocytes 12/18/2018 0.05  0.00 - 0.07 K/uL Final    Performed at [facility], [address] [place], [place] [address]    Assessment:  BIRD TAILOR is a 43 y.o. female withiron deficiency anemiasecondary to heavy menses. She hasa history of on and off anemia for several years (before 02/2014). Dietis fairly good. She denies any melena, hematochezia, or hematuria. She has hadice picax 2 months. She has a history of unresponsiveness to oral iron (4 month trial in  2015).  Labs on 11/30/2017revealed a hematocrit of 30.9, hemoglobin 9.3, MCV 73.4, platelets 675,000, Herring count 5300 with an ANC of 2650. Creatinine was0.7. Liver function tests were normal. Iron studies included a ferritin of 6, iron saturation 6% and TIBC of 440. HIV testing was negative.  Labs on 08/25/2016 revealed a hematocrit of 28.3, hemoglobin 9.2, and MCV 69.6.  Ferritin was 5.  She received Feraheme 510 mg IV on 08/29/2016 and 09/05/2016, 04/13/2018, 04/20/2018, and 09/19/2018.  Ferritin goal is 100.  Ferritin has been followed:  18 on 12/02/2015, 6 on 07/21/2016, 5 on 08/25/2016, 214 on 09/21/2016, 97 on 10/10/2016, 21 on 12/08/2016, 12 on 02/09/2017, 4 on 03/23/2018, 4 on 04/13/2018, 9 on 07/10/2018, 6 on 08/03/2018, and 5 on 09/19/2018, and 6 on 12/18/2018.  She has B12 deficiency. She received B12 shots x 2-3 months. B12 was 462 on 07/21/2016 and 434 on 12/14/2017.  She is on oral B12.  Symptomatically, she is fatigued.  Menses has improved.  She has ice pica.  Exam is unremarkable.  Plan: 1.   Review labs from 12/18/2018. 2.   Iron deficiency anemia             Hematocrit 29.4.  Hemoglobin 8.9.  MCV 74.4.    Ferritin 6.    Etiology secondary to menorrhagia.             Feraheme today. 3.   B12 deficiency  Patient has been on oral B12 during COV-19.  B12 was 434 on 12/14/2017.  Check B12 and folate at next lab draw. 4.   RTC in 1 month for labs (CBC, ferritin, B12, folate- day before), and +/- Feraheme. 5.   RTC in 3  months for MD assessment, labs (CBC, ferritin- day before), and +/- Feraheme.  I discussed the assessment and treatment plan with the patient.  The patient was provided an opportunity to ask questions and all were answered.  The patient agreed with the plan and demonstrated an understanding of the instructions.  The patient was advised to call back if the symptoms worsen or if the condition fails to improve as anticipated.  I provided 20 minutes of face-to-face time during this this encounter and > 50% was spent counseling as documented under my assessment and plan.    Lequita Asal, MD, PhD    12/20/2018, 3:00 PM  I, Molly Dorshimer, am acting as Education administrator for [employer]. Mike Gip, MD, PhD.  I, Melissa C. Mike Gip, MD, have reviewed the above documentation for accuracy and completeness, and I agree with the above.

## 2018-12-20 NOTE — Progress Notes (Signed)
No new changes noted today 

## 2018-12-20 NOTE — Patient Instructions (Signed)

## 2019-01-15 ENCOUNTER — Encounter: Payer: No Typology Code available for payment source | Admitting: Family Medicine

## 2019-01-22 ENCOUNTER — Encounter: Payer: Self-pay | Admitting: Family Medicine

## 2019-01-22 ENCOUNTER — Other Ambulatory Visit (HOSPITAL_COMMUNITY)
Admission: RE | Admit: 2019-01-22 | Discharge: 2019-01-22 | Disposition: A | Discharge status: Home / Self Care | Payer: No Typology Code available for payment source | Source: Ambulatory Visit | Attending: Family Medicine | Admitting: Family Medicine

## 2019-01-22 ENCOUNTER — Other Ambulatory Visit: Payer: Self-pay

## 2019-01-22 ENCOUNTER — Ambulatory Visit (INDEPENDENT_AMBULATORY_CARE_PROVIDER_SITE_OTHER): Payer: No Typology Code available for payment source | Admitting: Family Medicine

## 2019-01-22 VITALS — BP 147/88 | HR 90 | Temp 98.0°F | Resp 16 | Ht 65.0 in | Wt 189.2 lb

## 2019-01-22 DIAGNOSIS — E66811 Obesity, class 1: Secondary | ICD-10-CM

## 2019-01-22 DIAGNOSIS — Z1239 Encounter for other screening for malignant neoplasm of breast: Secondary | ICD-10-CM | POA: Diagnosis not present

## 2019-01-22 DIAGNOSIS — Z01419 Encounter for gynecological examination (general) (routine) without abnormal findings: Secondary | ICD-10-CM | POA: Diagnosis not present

## 2019-01-22 DIAGNOSIS — K429 Umbilical hernia without obstruction or gangrene: Secondary | ICD-10-CM

## 2019-01-22 DIAGNOSIS — Z124 Encounter for screening for malignant neoplasm of cervix: Secondary | ICD-10-CM | POA: Insufficient documentation

## 2019-01-22 DIAGNOSIS — R8290 Unspecified abnormal findings in urine: Secondary | ICD-10-CM | POA: Diagnosis not present

## 2019-01-22 DIAGNOSIS — E669 Obesity, unspecified: Secondary | ICD-10-CM

## 2019-01-22 DIAGNOSIS — E538 Deficiency of other specified B group vitamins: Secondary | ICD-10-CM

## 2019-01-22 DIAGNOSIS — F325 Major depressive disorder, single episode, in full remission: Secondary | ICD-10-CM

## 2019-01-22 DIAGNOSIS — G4733 Obstructive sleep apnea (adult) (pediatric): Secondary | ICD-10-CM

## 2019-01-22 MED ORDER — PHENTERMINE-TOPIRAMATE ER 7.5-46 MG PO CP24: 1.0000 | ORAL_CAPSULE | Freq: Every day | ORAL | 2 refills | Status: DC

## 2019-01-22 NOTE — Progress Notes (Signed)
Name: Sharon Herring   MRN: [id number]    DOB: April 08, 1976   Date:01/22/2019       Progress Note  Subjective  Chief Complaint  Chief Complaint  Patient presents with  . Annual Exam    HPI   Patient presents for annual CPE and obesity  Obesity: she started Qsymia 08/2018 last visit her weight was 198 lbs, she has been out of medication for the past month but still lost 9 lbs in the past 3 months. Tolerating medication well, she resumed  intermittent fasting, eating at 7:30 and only fluids in am and lunch at noon. She denies side effects of medication  Urine odor: she noticed that her urine is very concentrate and mild odor since yesterday, no dysuria or frequency  Iron deficiency anemia : secondary to menorrhagia, under the care of hematologist and gyn, considering having uterine fibroids removed, she also has a lot of cramping.   Diet: she has not bee consistent with her diet lately, but resumed an intermittent fasting Monday. She also had a gap with Qsymia because pharmacy was closed and needed a PA  Exercise: walking 1 hour daily, split 30 minutes blocks    USPSTF grade A and B recommendations    Office Visit from 01/22/2019 in [facility]  AUDIT-C Score  0     Depression: Phq 9 is  negative Depression screen [facility] 2/9 01/22/2019 01/22/2019 10/17/2018 08/07/2018 07/17/2018  Decreased Interest 0 0 0 0 0  Down, Depressed, Hopeless 0 0 0 0 0  PHQ - 2 Score 0 0 0 0 0  Altered sleeping 0 0 0 1 1  Tired, decreased energy 2 0 _0 Change in appetite 0 0 0 0 1  Feeling bad or failure about yourself  0 0 0 0 0  Trouble concentrating 0 0 0 0 0  Moving slowly or fidgety/restless 0 0 0 0 0  Suicidal thoughts 0 0 0 0 0  PHQ-9 Score 2 0 _1 Difficult doing work/chores Not difficult at all - Not difficult at all Not difficult at all Not difficult at all  Some recent data might be hidden   Hypertension: BP Readings from Last 3 Encounters:  01/22/19 (!) 147/88   12/20/18 121/81  12/20/18 117/72   Obesity: Wt Readings from Last 3 Encounters:  01/22/19 189 lb 3.2 oz (85.8 kg)  12/20/18 227 lb 13.5 oz (103.4 kg)  11/07/18 224 lb 3.2 oz (101.7 kg)   BMI Readings from Last 3 Encounters:  01/22/19 31.48 kg/m  12/20/18 37.92 kg/m  11/07/18 37.31 kg/m    Hep C Screening: today STD testing and prevention (HIV/chl/gon/syphilis): today  Intimate partner violence:negative screen  Sexual History/Pain during Intercourse: not recently, no pain with intercourse  Menstrual History/LMP/Abnormal Bleeding: she has heavy cycles under the care of Dr. Marcelline Mates Incontinence Symptoms:urine odor and concentrate , no incontinence symptoms   Advanced Care Planning: A voluntary discussion about advance care planning including the explanation and discussion of advance directives.  Discussed health care proxy and Living will, and the patient was able to identify a health care proxy as Julious Payer.  Patient does have a living will at present time.   Breast cancer: placed order  BRCA gene screening: N/A Cervical cancer screening: today   Osteoporosis Screening: discussed high calcium and high vitamin D diet and physical activity   Lipids:  Lab Results  Component Value Date   CHOL 170 12/14/2017   CHOL  177 07/21/2016   CHOL 163 06/08/2015   Lab Results  Component Value Date   HDL 41 (L) 12/14/2017   HDL 39 (L) 07/21/2016   HDL 36 (L) 06/08/2015   Lab Results  Component Value Date   LDLCALC 114 (H) 12/14/2017   LDLCALC 128 (H) 07/21/2016   LDLCALC 108 (H) 06/08/2015   Lab Results  Component Value Date   TRIG 63 12/14/2017   TRIG 52 07/21/2016   TRIG 94 06/08/2015   Lab Results  Component Value Date   CHOLHDL 4.1 12/14/2017   CHOLHDL 4.5 07/21/2016   CHOLHDL 4.5 06/08/2015   No results found for: LDLDIRECT  Glucose:  Glucose, Bld  Date Value Ref Range Status  03/23/2018 102 (H) 65 - 99 mg/dL Final    Comment:    .            Fasting  reference interval . For someone without known diabetes, a glucose value between 100 and 125 mg/dL is consistent with prediabetes and should be confirmed with a follow-up test. .   12/14/2017 106 (H) 65 - 99 mg/dL Final    Comment:    .            Fasting reference interval . For someone without known diabetes, a glucose value between 100 and 125 mg/dL is consistent with prediabetes and should be confirmed with a follow-up test. .   04/11/2017 127 (H) 65 - 99 mg/dL Final    Skin cancer: discussed atypical lesions  Colorectal cancer: discussed checking with insurance about coverage since she is AA   Lung cancer:   Low Dose CT Chest recommended if Age 31-80 years, 30 pack-year currently smoking OR have quit w/in 15years. Patient does not qualify.   ECG:N/a  Patient Active Problem List   Diagnosis Date Noted  . Lymphadenopathy 01/16/2018  . Moderate obstructive sleep apnea 12/19/2017  . Swelling of limb 01/27/2017  . B12 deficiency 03/28/2016  . Vitamin D deficiency 03/28/2016  . Hyperglycemia 03/28/2016  . Multiple gastric polyps 01/08/2016  . Menorrhagia 11/13/2015  . Chronic constipation 09/23/2015  . Obesity, Class I, BMI 30.0-34.9 (see actual BMI) 06/02/2015  . Iron deficiency anemia 03/14/2014  . History of uterine fibroid 03/14/2014    Past Surgical History:  Procedure Laterality Date  . APPENDECTOMY  2012  . ESOPHAGOGASTRODUODENOSCOPY (EGD) WITH PROPOFOL N/A 01/06/2016   Procedure: ESOPHAGOGASTRODUODENOSCOPY (EGD) WITH PROPOFOL;  Surgeon: Robert Bellow, MD;  Location: [facility] ENDOSCOPY;  Service: Endoscopy;  Laterality: N/A;  . ROBOTIC ASSISTED LAPAROSCOPIC CHOLECYSTECTOMY N/A 03/03/2016   Procedure: ROBOTIC ASSISTED LAPAROSCOPIC CHOLECYSTECTOMY ;  Surgeon: Clayburn Pert, MD;  Location: [facility] ORS;  Service: General;  Laterality: N/A;  . UTERINE FIBROID SURGERY  2012    Family History  Problem Relation Age of Onset  . Hypertension Mother   . Hypertension  Father   . Pancreatic cancer Father 49  . Breast cancer Neg Hx   . Colon cancer Neg Hx     Social History   Socioeconomic History  . Marital status: Single    Spouse name: Not on file  . Number of children: 0  . Years of education: Not on file  . Highest education level: Bachelor's degree (e.g., BA, AB, BS)  Occupational History  . Occupation: CMA  Social Needs  . Financial resource strain: Not hard at all  . Food insecurity:    Worry: Never true    Inability: Never true  . Transportation needs:    Medical:  No    Non-medical: No  Tobacco Use  . Smoking status: Never Smoker  . Smokeless tobacco: Never Used  Substance and Sexual Activity  . Alcohol use: No    Alcohol/week: 0.0 standard drinks  . Drug use: No  . Sexual activity: Not Currently    Partners: Male    Birth control/protection: Condom  Lifestyle  . Physical activity:    Days per week: 7 days    Minutes per session: 30 min  . Stress: Not at all  Relationships  . Social connections:    Talks on phone: More than three times a week    Gets together: More than three times a week    Attends religious service: More than 4 times per year    Active member of club or organization: Yes    Attends meetings of clubs or organizations: More than 4 times per year    Relationship status: Never married  . Intimate partner violence:    Fear of current or ex partner: No    Emotionally abused: No    Physically abused: No    Forced sexual activity: No  Other Topics Concern  . Not on file  Social History Narrative   Lives alone, works for [employer] at the [employer] , but switching to [employer]      Current Outpatient Medications:  .  BENZEPRO FOAMING CLOTHS 6 % MISC, Apply 1 each topically every other day., Disp: , Rfl:  .  fluticasone (FLONASE) 50 MCG/ACT nasal spray, Place 2 sprays into both nostrils daily., Disp: 16 g, Rfl: 6 .  furosemide (LASIX) 20 MG tablet, Take 1 tablet (20 mg total) by mouth daily. (Patient taking  differently: Take 20 mg by mouth daily as needed. ), Disp: 30 tablet, Rfl: 1 .  ibuprofen (ADVIL,MOTRIN) 800 MG tablet, Take 1 tablet (800 mg total) by mouth every 8 (eight) hours as needed., Disp: 60 tablet, Rfl: 3 .  iron polysaccharides (NIFEREX) 150 MG capsule, Take 150 mg by mouth every other day., Disp: , Rfl:  .  Iron-FA-B Cmp-C-Biot-Probiotic (FUSION PLUS) CAPS, Take 1 capsule by mouth daily., Disp: , Rfl:  .  linaclotide (LINZESS) 145 MCG CAPS capsule, Take 1 capsule (145 mcg total) by mouth daily before breakfast., Disp: 30 capsule, Rfl: 2 .  Multiple Vitamins-Minerals (MULTIVITAMIN ADULT) TABS, Take 1 tablet by mouth daily., Disp: 30 tablet, Rfl: 0 .  norethindrone (AYGESTIN) 5 MG tablet, Take 1 tablet (5 mg total) by mouth daily., Disp: 30 tablet, Rfl: 2 .  Phentermine-Topiramate (QSYMIA) 7.5-46 MG CP24, Take 1 tablet by mouth daily., Disp: 30 capsule, Rfl: 2 .  PREVIDENT 5000 BOOSTER PLUS 1.1 % PSTE, , Disp: , Rfl: 2  Allergies  Allergen Reactions  . Aspirin Nausea And Vomiting  . Oxycodone-Acetaminophen Rash and Shortness Of Breath  . Lexapro [Escitalopram] Palpitations  . Tape Other (See Comments)    Burning feeling. Has left mark on skin.  Melynda Keller [Lorcaserin Hcl] Other (See Comments)    headache  . Contrave [Naltrexone-Bupropion Hcl Er]   . Dicyclomine Nausea And Vomiting  . Norethindrone Rash     ROS  Constitutional: Negative for fever, positive for  weight change.  Respiratory: Negative for cough and shortness of breath.   Cardiovascular: Negative for chest pain or palpitations.  Gastrointestinal: mild peri-umbilical pain,  no bowel changes.  Musculoskeletal: Negative for gait problem or joint swelling.  Skin: Negative for rash.  Neurological: Negative for dizziness or headache.  No other specific  complaints in a complete review of systems (except as listed in HPI above).  Objective  Vitals:   01/22/19 1137  BP: (!) 147/88  Pulse: 90  Resp: 16  Temp: 98  F (36.7 C)  TempSrc: Oral  SpO2: 99%  Weight: 189 lb 3.2 oz (85.8 kg)  Height: _0  (1.651 m)    Body mass index is 31.48 kg/m.  Physical Exam  Constitutional: Patient appears well-developed and well-nourished. No distress.  HENT: Head: Normocephalic and atraumatic. Ears: B TMs ok, no erythema or effusion; Nose: Nose normal. Mouth/Throat: Oropharynx is clear and moist. No oropharyngeal exudate.  Eyes: Conjunctivae and EOM are normal. Pupils are equal, round, and reactive to light. No scleral icterus.  Neck: Normal range of motion. Neck supple. No JVD present. No thyromegaly present.  Cardiovascular: Normal rate, regular rhythm and normal heart sounds.  No murmur heard. No BLE edema. Pulmonary/Chest: Effort normal and breath sounds normal. No respiratory distress. Abdominal: Soft. Bowel sounds are normal, no distension. There is mild  Tenderness on umbilicus and smal hernia, no redness or increase in warmth. no masses, scars from previous laparoscopic surgery  Breast: no lumps or masses, no nipple discharge or rashes FEMALE GENITALIA:  External genitalia normal External urethra normal Vaginal vault normal without discharge or lesions, lots of blood from current cycle Cervix normal without discharge or lesions Bimanual exam not done, RECTAL: not done Musculoskeletal: Normal range of motion, no joint effusions. No gross deformities Neurological: he is alert and oriented to person, place, and time. No cranial nerve deficit. Coordination, balance, strength, speech and gait are normal.  Skin: Skin is warm and dry. No rash noted. No erythema.  Psychiatric: Patient has a normal mood and affect. behavior is normal. Judgment and thought content normal.   Recent Results (from the past 2160 hour(s))  POCT Influenza A/B     Status: Normal   Collection Time: 11/05/18  9:53 AM  Result Value Ref Range   Influenza A, POC Negative Negative   Influenza B, POC Negative Negative  POCT rapid  strep A     Status: Normal   Collection Time: 11/05/18  9:53 AM  Result Value Ref Range   Rapid Strep A Screen Negative Negative  Sedimentation rate     Status: Abnormal   Collection Time: 12/18/18 10:29 AM  Result Value Ref Range   Sed Rate 36 (H) 0 - 20 mm/hr    Comment: Performed at [facility], [address] [place], [place] [address]  Ferritin     Status: Abnormal   Collection Time: 12/18/18 10:29 AM  Result Value Ref Range   Ferritin 6 (L) 11 - 307 ng/mL    Comment: Performed at [facility], [address] [place], [place] [address]  CBC with Differential     Status: Abnormal   Collection Time: 12/18/18 10:29 AM  Result Value Ref Range   WBC 6.3 4.0 - 10.5 K/uL   RBC 3.95 3.87 - 5.11 MIL/uL   Hemoglobin 8.9 (L) 12.0 - 15.0 g/dL   HCT 29.4 (L) 36.0 - 46.0 %   MCV 74.4 (L) 80.0 - 100.0 fL   MCH 22.5 (L) 26.0 - 34.0 pg   MCHC 30.3 30.0 - 36.0 g/dL   RDW 19.2 (H) 11.5 - 15.5 %   Platelets 505 (H) 150 - 400 K/uL   nRBC 0.0 0.0 - 0.2 %   Neutrophils Relative % 46 %   Neutro Abs 3.0 1.7 - 7.7 K/uL  Lymphocytes Relative 41 %   Lymphs Abs 2.6 0.7 - 4.0 K/uL   Monocytes Relative 10 %   Monocytes Absolute 0.6 0.1 - 1.0 K/uL   Eosinophils Relative 1 %   Eosinophils Absolute 0.1 0.0 - 0.5 K/uL   Basophils Relative 1 %   Basophils Absolute 0.0 0.0 - 0.1 K/uL   Immature Granulocytes 1 %   Abs Immature Granulocytes 0.05 0.00 - 0.07 K/uL    Comment: Performed at [facility], [address] [place], [place] [address]    PHQ2/9: Depression screen [facility] 2/9 01/22/2019 01/22/2019 10/17/2018 08/07/2018 07/17/2018  Decreased Interest 0 0 0 0 0  Down, Depressed, Hopeless 0 0 0 0 0  PHQ - 2 Score 0 0 0 0 0  Altered sleeping 0 0 0 1 1  Tired, decreased energy 2 0 _0 Change in appetite 0 0 0 0 1  Feeling bad or failure about yourself  0 0 0 0 0  Trouble concentrating 0 0 0 0 0  Moving slowly or fidgety/restless 0 0 0 0 0  Suicidal thoughts 0 0  0 0 0  PHQ-9 Score 2 0 _1 Difficult doing work/chores Not difficult at all - Not difficult at all Not difficult at all Not difficult at all  Some recent data might be hidden     Fall Risk: Fall Risk  01/22/2019 10/17/2018 08/07/2018 07/17/2018 04/20/2018  Falls in the past year? 0 0 0 0 No  Number falls in past yr: 0 - 0 0 -  Injury with Fall? 0 - 0 - -     Functional Status Survey: Is the patient deaf or have difficulty hearing?: No Does the patient have difficulty seeing, even when wearing glasses/contacts?: No Does the patient have difficulty concentrating, remembering, or making decisions?: No Does the patient have difficulty walking or climbing stairs?: No Does the patient have difficulty dressing or bathing?: No Does the patient have difficulty doing errands alone such as visiting a doctor's office or shopping?: No   Assessment & Plan  1. Well woman exam  - Lipid panel - COMPLETE METABOLIC PANEL WITH GFR - Hemoglobin A1c - HIV Antibody (routine testing w rflx) - Hepatitis panel, acute - RPR - VITAMIN D 25 Hydroxy (Vit-D Deficiency, Fractures) - TSH  2. Cervical cancer screening  - Cytology - PAP  3. Breast cancer screening  - MM Digital Screening; Future  4. Abnormal urine odor  - CULTURE, URINE COMPREHENSIVE  5. Obesity, Class I, BMI 30.0-34.9 (see actual BMI)  - Phentermine-Topiramate (QSYMIA) 7.5-46 MG CP24; Take 1 tablet by mouth daily.  Dispense: 30 capsule; Refill: 2  6. B12 deficiency   7. Major depression in remission [facility])  Doing well  8. Moderate obstructive sleep apnea  We will try again to find out how she can get CPAP supplies, still does not have it - For home use only DME continuous positive airway pressure (CPAP)  10. Umbilical hernia without obstruction and without gangrene  - Ambulatory referral to General Surgery  -USPSTF grade A and B recommendations reviewed with patient; age-appropriate recommendations, preventive care,  screening tests, etc discussed and encouraged; healthy living encouraged; see AVS for patient education given to patient -Discussed importance of 150 minutes of physical activity weekly, eat two servings of fish weekly, eat one serving of tree nuts ( cashews, pistachios, pecans, almonds.Marland Kitchen) every other day, eat 6 servings of fruit/vegetables daily and drink plenty of water and avoid sweet  beverages.

## 2019-01-24 LAB — COMPLETE METABOLIC PANEL WITH GFR
AG Ratio: 1.3 (calc) (ref 1.0–2.5)
ALT: 10 U/L (ref 6–29)
AST: 11 U/L (ref 10–30)
Albumin: 3.9 g/dL (ref 3.6–5.1)
Alkaline phosphatase (APISO): 70 U/L (ref 31–125)
BUN: 10 mg/dL (ref 7–25)
CO2: 26 mmol/L (ref 20–32)
Calcium: 9.4 mg/dL (ref 8.6–10.2)
Chloride: 105 mmol/L (ref 98–110)
Creat: 0.69 mg/dL (ref 0.50–1.10)
GFR, Est African American: 125 mL/min/{1.73_m2} (ref >=60)
GFR, Est Non African American: 108 mL/min/{1.73_m2} (ref >=60)
Globulin: 3.1 g/dL (calc) (ref 1.9–3.7)
Glucose, Bld: 94 mg/dL (ref 65–99)
Potassium: 4.1 mmol/L (ref 3.5–5.3)
Sodium: 139 mmol/L (ref 135–146)
Total Bilirubin: 0.2 mg/dL (ref 0.2–1.2)
Total Protein: 7 g/dL (ref 6.1–8.1)

## 2019-01-24 LAB — TSH: TSH: 3.04 mIU/L

## 2019-01-24 LAB — CULTURE, URINE COMPREHENSIVE
MICRO NUMBER:: 529410
SPECIMEN QUALITY:: ADEQUATE

## 2019-01-24 LAB — HEPATITIS PANEL, ACUTE
Hep A IgM: NONREACTIVE
Hep B C IgM: NONREACTIVE
Hepatitis B Surface Ag: NONREACTIVE
Hepatitis C Ab: NONREACTIVE
SIGNAL TO CUT-OFF: 0.09 (ref <1.00)

## 2019-01-24 LAB — HEMOGLOBIN A1C
Hgb A1c MFr Bld: 5.8 % of total Hgb — ABNORMAL HIGH (ref <5.7)
Mean Plasma Glucose: 120 (calc)
eAG (mmol/L): 6.6 (calc)

## 2019-01-24 LAB — VITAMIN D 25 HYDROXY (VIT D DEFICIENCY, FRACTURES): Vit D, 25-Hydroxy: 18 ng/mL — ABNORMAL LOW (ref 30–100)

## 2019-01-24 LAB — RPR: RPR Ser Ql: NONREACTIVE

## 2019-01-24 LAB — LIPID PANEL
Cholesterol: 171 mg/dL (ref <200)
HDL: 35 mg/dL — ABNORMAL LOW (ref >=50)
LDL Cholesterol (Calc): 114 mg/dL (calc) — ABNORMAL HIGH
Non-HDL Cholesterol (Calc): 136 mg/dL (calc) — ABNORMAL HIGH (ref <130)
Total CHOL/HDL Ratio: 4.9 (calc) (ref <5.0)
Triglycerides: 108 mg/dL (ref <150)

## 2019-01-24 LAB — HIV ANTIBODY (ROUTINE TESTING W REFLEX): HIV 1&2 Ab, 4th Generation: NONREACTIVE

## 2019-01-29 LAB — CYTOLOGY - PAP
Chlamydia: NEGATIVE
Diagnosis: NEGATIVE
HPV: DETECTED — AB
Neisseria Gonorrhea: NEGATIVE

## 2019-02-04 ENCOUNTER — Encounter: Payer: Self-pay | Admitting: Surgery

## 2019-02-04 ENCOUNTER — Other Ambulatory Visit: Payer: Self-pay

## 2019-02-04 ENCOUNTER — Ambulatory Visit (INDEPENDENT_AMBULATORY_CARE_PROVIDER_SITE_OTHER): Payer: No Typology Code available for payment source | Admitting: Surgery

## 2019-02-04 VITALS — BP 130/86 | HR 76 | Temp 97.5°F | Resp 14 | Ht 64.0 in | Wt 229.0 lb

## 2019-02-04 DIAGNOSIS — K432 Incisional hernia without obstruction or gangrene: Secondary | ICD-10-CM

## 2019-02-04 NOTE — Progress Notes (Signed)
Patient ID: Sharon Herring, female   DOB: May 26, 1976, 43 y.o.   MRN: [id number]  HPI Sharon Herring is a 43 y.o. female seen in consultation at the request of Dr. Ancil Boozer for a symptomatic ventral hernia.  Did have a history of a cholecystectomy in the past as well as diagnostic laparoscopy.  Over the last few months has noticed a bulge next to 1 of the umbilical incisions.  She reports intermittent abdominal pain that is mild to moderate intensity.  Worsening with Valsalva.  No fevers no chills.  She is able to perform more than 4 METS of activity without any shortness of breath or chest pain.  Had a recent CMP  that were completely normal.  She had a normal hemoglobin A1C, hb 8.9 PL 505k.  Had a recent chest x-ray that I have personally review with no acute abnormalities.  HPI  Past Medical History:  Diagnosis Date  . Anemia   . Dizziness   . Fibroid uterus   . GERD (gastroesophageal reflux disease)   . IBS (irritable bowel syndrome)   . Migraine   . Polyp of stomach 01/06/2016   Stomach polyp, pyloric, consistent with prolapse.     Past Surgical History:  Procedure Laterality Date  . APPENDECTOMY  2012  . ESOPHAGOGASTRODUODENOSCOPY (EGD) WITH PROPOFOL N/A 01/06/2016   Procedure: ESOPHAGOGASTRODUODENOSCOPY (EGD) WITH PROPOFOL;  Surgeon: Robert Bellow, MD;  Location: [facility] ENDOSCOPY;  Service: Endoscopy;  Laterality: N/A;  . ROBOTIC ASSISTED LAPAROSCOPIC CHOLECYSTECTOMY N/A 03/03/2016   Procedure: ROBOTIC ASSISTED LAPAROSCOPIC CHOLECYSTECTOMY ;  Surgeon: Clayburn Pert, MD;  Location: [facility] ORS;  Service: General;  Laterality: N/A;  . UTERINE FIBROID SURGERY  2012    Family History  Problem Relation Age of Onset  . Hypertension Mother   . Hypertension Father   . Pancreatic cancer Father 56  . Breast cancer Neg Hx   . Colon cancer Neg Hx     Social History Social History   Tobacco Use  . Smoking status: Never Smoker  . Smokeless tobacco: Never Used  Substance Use Topics  .  Alcohol use: No    Alcohol/week: 0.0 standard drinks  . Drug use: No    Allergies  Allergen Reactions  . Aspirin Nausea And Vomiting  . Oxycodone-Acetaminophen Rash and Shortness Of Breath  . Lexapro [Escitalopram] Palpitations  . Tape Other (See Comments)    Burning feeling. Has left mark on skin.  Melynda Keller [Lorcaserin Hcl] Other (See Comments)    headache  . Contrave [Naltrexone-Bupropion Hcl Er]   . Dicyclomine Nausea And Vomiting  . Norethindrone Rash    Current Outpatient Medications  Medication Sig Dispense Refill  . BENZEPRO FOAMING CLOTHS 6 % MISC Apply 1 each topically every other day.    . fluticasone (FLONASE) 50 MCG/ACT nasal spray Place 2 sprays into both nostrils daily. 16 g 6  . furosemide (LASIX) 20 MG tablet Take 1 tablet (20 mg total) by mouth daily. (Patient taking differently: Take 20 mg by mouth daily as needed. ) 30 tablet 1  . ibuprofen (ADVIL,MOTRIN) 800 MG tablet Take 1 tablet (800 mg total) by mouth every 8 (eight) hours as needed. 60 tablet 3  . iron polysaccharides (NIFEREX) 150 MG capsule Take 150 mg by mouth every other day.    . Iron-FA-B Cmp-C-Biot-Probiotic (FUSION PLUS) CAPS Take 1 capsule by mouth daily.    Marland Kitchen linaclotide (LINZESS) 145 MCG CAPS capsule Take 1 capsule (145 mcg total) by mouth daily before breakfast. 30  capsule 2  . Multiple Vitamins-Minerals (MULTIVITAMIN ADULT) TABS Take 1 tablet by mouth daily. 30 tablet 0  . norethindrone (AYGESTIN) 5 MG tablet Take 1 tablet (5 mg total) by mouth daily. 30 tablet 2  . Phentermine-Topiramate (QSYMIA) 7.5-46 MG CP24 Take 1 tablet by mouth daily. 30 capsule 2  . PREVIDENT 5000 BOOSTER PLUS 1.1 % PSTE   2   No current facility-administered medications for this visit.      Review of Systems Full ROS  was asked and was negative except for the information on the HPI  Physical Exam Blood pressure 130/86, pulse 76, temperature (!) 97.5 F (36.4 C), temperature source Skin, resp. rate 14, height 5'  4" (1.626 m), weight 229 lb (103.9 kg), SpO2 99 %. CONSTITUTIONAL: NAD EYES: Pupils are equal, round, and reactive to light, Sclera are non-icteric. EARS, NOSE, MOUTH AND THROAT: The oropharynx is clear. The oral mucosa is pink and moist. Hearing is intact to voice. LYMPH NODES:  Lymph nodes in the neck are normal. RESPIRATORY:  Lungs are clear. There is normal respiratory effort, with equal breath sounds bilaterally, and without pathologic use of accessory muscles. CARDIOVASCULAR: Heart is regular without murmurs, gallops, or rubs. GI: The abdomen is soft, and nondistended. There is around a 3-1/2 cm ventral hernia around the umbilicus next to the cholecystectomy scar.  Mildly tender to palpation but reduces easily  There are no palpable masses. There is no hepatosplenomegaly. There are normal bowel sounds in all quadrants. GU: Rectal deferred.   MUSCULOSKELETAL: Normal muscle strength and tone. No cyanosis or edema.   SKIN: Turgor is good and there are no pathologic skin lesions or ulcers. NEUROLOGIC: Motor and sensation is grossly normal. Cranial nerves are grossly intact. PSYCH:  Oriented to person, place and time. Affect is normal.  Data Reviewed  I have personally reviewed the patient's imaging, laboratory findings and medical records.    Assessment/Plan 43 year old female with a symptomatic ventral hernia.  Discussed with the patient in detail about her disease process.  I definitely recommend repair and I do think that she is a good candidate for robotic assisted laparoscopic ventral hernia repair.  Procedure discussed with the patient in detail.  Risk, benefits and possible complications including but not limited to: Bleeding, infection, recurrence, bowel injury and chronic pain discussed with the patient in detail.  She understands and wishes to proceed. Copy of this report was sent to the referring provider  Caroleen Hamman, MD FACS General Surgeon 02/05/2019, 4:42 PM

## 2019-02-04 NOTE — Patient Instructions (Signed)
Umbilical Hernia, Adult  A hernia is a bulge of tissue that pushes through an opening between muscles. An umbilical hernia happens in the abdomen, near the belly button (umbilicus). The hernia may contain tissues from the small intestine, large intestine, or fatty tissue covering the intestines (omentum). Umbilical hernias in adults tend to get worse over time, and they require surgical treatment. There are several types of umbilical hernias. You may have:  A hernia located just above or below the umbilicus (indirect hernia). This is the most common type of umbilical hernia in adults.  A hernia that forms through an opening formed by the umbilicus (direct hernia).  A hernia that comes and goes (reducible hernia). A reducible hernia may be visible only when you strain, lift something heavy, or cough. This type of hernia can be pushed back into the abdomen (reduced).  A hernia that traps abdominal tissue inside the hernia (incarcerated hernia). This type of hernia cannot be reduced.  A hernia that cuts off blood flow to the tissues inside the hernia (strangulated hernia). The tissues can start to die if this happens. This type of hernia requires emergency treatment. What are the causes? An umbilical hernia happens when tissue inside the abdomen presses on a weak area of the abdominal muscles. What increases the risk? You may have a greater risk of this condition if you:  Are obese.  Have had several pregnancies.  Have a buildup of fluid inside your abdomen (ascites).  Have had surgery that weakens the abdominal muscles. What are the signs or symptoms? The main symptom of this condition is a painless bulge at or near the belly button. A reducible hernia may be visible only when you strain, lift something heavy, or cough. Other symptoms may include:  Dull pain.  A feeling of pressure. Symptoms of a strangulated hernia may include:  Pain that gets increasingly worse.  Nausea and  vomiting.  Pain when pressing on the hernia.  Skin over the hernia becoming red or purple.  Constipation.  Blood in the stool. How is this diagnosed? This condition may be diagnosed based on:  A physical exam. You may be asked to cough or strain while standing. These actions increase the pressure inside your abdomen and force the hernia through the opening in your muscles. Your health care provider may try to reduce the hernia by pressing on it.  Your symptoms and medical history. How is this treated? Surgery is the only treatment for an umbilical hernia. Surgery for a strangulated hernia is done as soon as possible. If you have a small hernia that is not incarcerated, you may need to lose weight before having surgery. Follow these instructions at home:  Lose weight, if told by your health care provider.  Do not try to push the hernia back in.  Watch your hernia for any changes in color or size. Tell your health care provider if any changes occur.  You may need to avoid activities that increase pressure on your hernia.  Do not lift anything that is heavier than 10 lb (4.5 kg) until your health care provider says that this is safe.  Take over-the-counter and prescription medicines only as told by your health care provider.  Keep all follow-up visits as told by your health care provider. This is important. Contact a health care provider if:  Your hernia gets larger.  Your hernia becomes painful. Get help right away if:  You develop sudden, severe pain near the area of your hernia.    You have pain as well as nausea or vomiting.  You have pain and the skin over your hernia changes color.  You develop a fever. This information is not intended to replace advice given to you by your health care provider. Make sure you discuss any questions you have with your health care provider. Document Released: 01/08/2016 Document Revised: 09/20/2017 Document Reviewed: 02/06/2017 Elsevier  Interactive Patient Education  2019 Elsevier Inc.  

## 2019-02-05 ENCOUNTER — Ambulatory Visit: Payer: No Typology Code available for payment source | Admitting: General Surgery

## 2019-02-05 ENCOUNTER — Encounter: Payer: Self-pay | Admitting: Surgery

## 2019-02-06 ENCOUNTER — Telehealth: Payer: Self-pay | Admitting: *Deleted

## 2019-02-06 NOTE — Telephone Encounter (Signed)
Patient contacted today about scheduling surgery with Dr. Dahlia Byes. She is wanting to wait until her mom is finished with treatments to have her own surgery.   Patient's surgery to be scheduled for 03-21-19 at [facility] with Dr. Dahlia Byes.  The patient is aware to have COVID-19 testing done on 03-18-19 at the [facility] drive thru ([address] [place]) between 8:00 am and 10:30 am. She is aware to isolate after, have no visitors, wash hands frequently, and avoid touching face.   The patient is aware she will be contacted by the [facility] to complete a phone interview sometime in the near future.  Surgery instruction paperwork has been mailed to the patient.   The patient is aware to call the office should she have further questions.

## 2019-02-11 ENCOUNTER — Ambulatory Visit: Payer: No Typology Code available for payment source | Admitting: Nurse Practitioner

## 2019-02-12 ENCOUNTER — Ambulatory Visit: Payer: No Typology Code available for payment source | Admitting: Family Medicine

## 2019-02-12 ENCOUNTER — Encounter: Payer: Self-pay | Admitting: Family Medicine

## 2019-02-12 ENCOUNTER — Other Ambulatory Visit: Payer: Self-pay

## 2019-02-13 NOTE — Progress Notes (Signed)
Error

## 2019-02-19 ENCOUNTER — Telehealth: Payer: Self-pay | Admitting: *Deleted

## 2019-02-19 NOTE — Telephone Encounter (Signed)
Patient called the office back and would like to move surgery from 03-21-19 to 03-28-19. She is wanting to wait to have surgery when her mom is done with chemo.   The patient is aware COVID testing will need to be done on 03-25-19 between 8 am and 10:30 am.   Patient is aware that all other instructions will remain the same.   She is aware to call the office should she have further questions.

## 2019-02-19 NOTE — Telephone Encounter (Signed)
Message left on work number for patient to call the office back.

## 2019-02-19 NOTE — Telephone Encounter (Signed)
Patient called and is scheduled for surgery on 03/21/19 and wants to get it rescheduled for the 1st of August, please call patient on her work number at [phone]

## 2019-02-25 ENCOUNTER — Telehealth: Payer: Self-pay | Admitting: Surgery

## 2019-02-25 NOTE — Telephone Encounter (Signed)
Patient needs to reschedule surgery that is scheduled with Dr Dahlia Byes on 8/6. Patient's mother's radiation treatment was extended to 9/7 and would like to reschedule anytime after 9/7. Please contact patient at [phone].

## 2019-02-26 NOTE — Telephone Encounter (Signed)
Spoke with patient about rescheduling her surgery. She needs to be for after 03/01/19.   Patient's surgery to be rescheduled for 05/07/19 at [facility] with Dr. Dahlia Byes  The patient is aware to have COVID-19 testing done on 05/03/19 at the [facility] drive thru ([address] [place]) between 8:00 am and 10:30 am. She is aware to isolate after, have no visitors, wash hands frequently, and avoid touching face.   The patient is aware she will be contacted by the [facility] to complete a phone interview sometime in the near future.  She is aware to call the day before surgery to get her arrival time.   Patient aware to be NPO after midnight and have a driver.   She is aware to check in at the [place] entrance where she will be screened for the coronavirus and then sent to Same Day Surgery.   Patient aware that she may have no visitors and driver will need to wait in the car due to COVID-19 restrictions.   The patient verbalizes understanding of the above.   The patient is aware to call the office should she have further questions.

## 2019-03-15 ENCOUNTER — Other Ambulatory Visit: Payer: No Typology Code available for payment source

## 2019-03-18 ENCOUNTER — Other Ambulatory Visit: Payer: No Typology Code available for payment source

## 2019-03-19 ENCOUNTER — Other Ambulatory Visit: Payer: No Typology Code available for payment source

## 2019-03-20 ENCOUNTER — Ambulatory Visit: Payer: No Typology Code available for payment source

## 2019-03-20 ENCOUNTER — Inpatient Hospital Stay: Payer: No Typology Code available for payment source | Attending: Hematology and Oncology

## 2019-03-20 ENCOUNTER — Ambulatory Visit: Payer: No Typology Code available for payment source | Admitting: Hematology and Oncology

## 2019-03-20 ENCOUNTER — Other Ambulatory Visit: Payer: Self-pay

## 2019-03-20 ENCOUNTER — Telehealth: Payer: Self-pay

## 2019-03-20 DIAGNOSIS — R202 Paresthesia of skin: Secondary | ICD-10-CM | POA: Insufficient documentation

## 2019-03-20 DIAGNOSIS — D500 Iron deficiency anemia secondary to blood loss (chronic): Secondary | ICD-10-CM

## 2019-03-20 DIAGNOSIS — R5383 Other fatigue: Secondary | ICD-10-CM | POA: Insufficient documentation

## 2019-03-20 DIAGNOSIS — E538 Deficiency of other specified B group vitamins: Secondary | ICD-10-CM | POA: Insufficient documentation

## 2019-03-20 DIAGNOSIS — Z8249 Family history of ischemic heart disease and other diseases of the circulatory system: Secondary | ICD-10-CM | POA: Diagnosis not present

## 2019-03-20 DIAGNOSIS — Z800 Family history of malignant neoplasm of digestive organs: Secondary | ICD-10-CM | POA: Insufficient documentation

## 2019-03-20 DIAGNOSIS — N920 Excessive and frequent menstruation with regular cycle: Secondary | ICD-10-CM | POA: Insufficient documentation

## 2019-03-20 DIAGNOSIS — Z86010 Personal history of colonic polyps: Secondary | ICD-10-CM | POA: Diagnosis not present

## 2019-03-20 DIAGNOSIS — Z79899 Other long term (current) drug therapy: Secondary | ICD-10-CM | POA: Diagnosis not present

## 2019-03-20 LAB — CBC WITH DIFFERENTIAL/PLATELET
Abs Immature Granulocytes: 0.05 10*3/uL (ref 0.00–0.07)
Basophils Absolute: 0 10*3/uL (ref 0.0–0.1)
Basophils Relative: 1 %
Eosinophils Absolute: 0.1 10*3/uL (ref 0.0–0.5)
Eosinophils Relative: 1 %
HCT: 28.7 % — ABNORMAL LOW (ref 36.0–46.0)
Hemoglobin: 8.6 g/dL — ABNORMAL LOW (ref 12.0–15.0)
Immature Granulocytes: 1 %
Lymphocytes Relative: 38 %
Lymphs Abs: 2.3 10*3/uL (ref 0.7–4.0)
MCH: 21.9 pg — ABNORMAL LOW (ref 26.0–34.0)
MCHC: 30 g/dL (ref 30.0–36.0)
MCV: 73.2 fL — ABNORMAL LOW (ref 80.0–100.0)
Monocytes Absolute: 0.7 10*3/uL (ref 0.1–1.0)
Monocytes Relative: 11 %
Neutro Abs: 3 10*3/uL (ref 1.7–7.7)
Neutrophils Relative %: 48 %
Platelets: 458 10*3/uL — ABNORMAL HIGH (ref 150–400)
RBC: 3.92 MIL/uL (ref 3.87–5.11)
RDW: 18.9 % — ABNORMAL HIGH (ref 11.5–15.5)
WBC: 6.2 10*3/uL (ref 4.0–10.5)
nRBC: 0 % (ref 0.0–0.2)

## 2019-03-20 LAB — VITAMIN B12: Vitamin B-12: 263 pg/mL (ref 180–914)

## 2019-03-20 LAB — FOLATE: Folate: 8.3 ng/mL (ref >5.9)

## 2019-03-20 LAB — FERRITIN: Ferritin: 5 ng/mL — ABNORMAL LOW (ref 11–307)

## 2019-03-20 NOTE — Telephone Encounter (Signed)
-----   Message from Lequita Asal, MD sent at 03/20/2019  4:52 PM EDT ----- Regarding: Please call patient  B12 is low.  Notes suggest she was on oral B12.  Dose?  Does she want to start B12 injections?  M ----- Message ----- From: Buel Ream, [facility] [place] Sent: 03/20/2019   8:56 AM EDT To: Lequita Asal, MD

## 2019-03-20 NOTE — Telephone Encounter (Signed)
Talked to patient and informed her of ferritin and b12 results. Informed her she will need to get Feraheme due to low Ferritin levels. Patient understands she will need u-preg prior to getting infusion. Also talked to patient about her oral B12. Patient states she has not taken any in about a month and would prefer oral B12 over the injections. Informed patient to start Oral B12 1000 MCG daily. Patient verbalizes understanding and denies any further questions or concerns.

## 2019-03-25 ENCOUNTER — Other Ambulatory Visit: Payer: No Typology Code available for payment source

## 2019-03-25 NOTE — Progress Notes (Signed)
[facility]  [address], [address] [place], [place] [address] Phone: [phone]  Fax: [phone]   Clinic Day:  03/27/2019  Referring physician: Steele Sizer, MD  Chief Complaint: Sharon Herring is a 43 y.o. female with iron deficiency anemia who is seen for 3 month assessment.   HPI: The patient was last seen in the hematology clinic on 12/20/2018. At that time, she was fatigued.  Menses had improved.  She had ice pica.  Exam was unremarkable. Hematocrit was 29.4, hemoglobin 8.9, and MCV 74.4.  Ferritin was 6.  She received Feraheme.   TSH was 3.04 on 01/22/2019.  She was seen in general surgery by Caroleen Hamman, MD.  Ventral hernia repair was scheduled for 05/07/2019.   Labs on 03/20/2019: hematocrit 28.7, hemoglobin 8.6, MCV 73.2, platelets 458,000, WBC 6,200. Ferritin 5. Folate 8.3. B12 was 263.  She was contacted on 03/20/2019 regarding her low B12 level. She had not taken oral B12 in over a month due to fatigue she associated with the pills. She preferred oral B12 over B12 injections. She was scheduled to receive a B-12 injection at her PCP today. She restarted oral B-12 last week.   During the interim, she is doing "okay." She reports difficulty sleeping over the past two weeks. Her brother and grandfather both recently passed away. She reports her most recent menstrual cycle, which ended Tuesday, was heavier than normal. She notes severe fatigue during the day. She denies any blood in her stools, black stools, or blood in her urine. She reports bilateral lower extremity edema. She reports continued ice pica.   She will meet with her OB-GYN, Dr. Marcelline Mates, next week to discuss coordinating fibroid removal during her hernia surgery.   She would prefer B-12 injections over oral B-12.    Past Medical History:  Diagnosis Date  . Anemia   . Dizziness   . Fibroid uterus   . GERD (gastroesophageal reflux disease)   . IBS (irritable bowel syndrome)   .  Migraine   . Polyp of stomach 01/06/2016   Stomach polyp, pyloric, consistent with prolapse.     Past Surgical History:  Procedure Laterality Date  . APPENDECTOMY  2012  . ESOPHAGOGASTRODUODENOSCOPY (EGD) WITH PROPOFOL N/A 01/06/2016   Procedure: ESOPHAGOGASTRODUODENOSCOPY (EGD) WITH PROPOFOL;  Surgeon: Robert Bellow, MD;  Location: [facility] ENDOSCOPY;  Service: Endoscopy;  Laterality: N/A;  . ROBOTIC ASSISTED LAPAROSCOPIC CHOLECYSTECTOMY N/A 03/03/2016   Procedure: ROBOTIC ASSISTED LAPAROSCOPIC CHOLECYSTECTOMY ;  Surgeon: Clayburn Pert, MD;  Location: [facility] ORS;  Service: General;  Laterality: N/A;  . UTERINE FIBROID SURGERY  2012    Family History  Problem Relation Age of Onset  . Hypertension Mother   . Hypertension Father   . Pancreatic cancer Father 60  . Breast cancer Neg Hx   . Colon cancer Neg Hx     Social History:  reports that she has never smoked. She has never used smokeless tobacco. She reports that she does not drink alcohol or use drugs.She denies any exposure to radiation or toxins. She works at [employer] as a Psychologist, sport and exercise. She lives in [place]. Her brother passed away from a blood clot in 03-17-19 and her grandfather passed away in 17-Mar-2019 from pneumonia. The patient is alone today.  Allergies:  Allergies  Allergen Reactions  . Aspirin Nausea And Vomiting  . Oxycodone-Acetaminophen Rash and Shortness Of Breath  . Lexapro [Escitalopram] Palpitations  . Tape Other (See Comments)    Burning feeling. Has left mark  on skin.  Melynda Keller [Lorcaserin Hcl] Other (See Comments)    headache  . Contrave [Naltrexone-Bupropion Hcl Er]   . Dicyclomine Nausea And Vomiting  . Norethindrone Rash    Current Medications: Current Outpatient Medications  Medication Sig Dispense Refill  . BENZEPRO FOAMING CLOTHS 6 % MISC Apply 1 each topically every other day.    . fluticasone (FLONASE) 50 MCG/ACT nasal spray Place 2 sprays into both nostrils daily. 16 g 6  . furosemide  (LASIX) 20 MG tablet Take 1 tablet (20 mg total) by mouth daily. (Patient taking differently: Take 20 mg by mouth daily as needed. ) 30 tablet 1  . iron polysaccharides (NIFEREX) 150 MG capsule Take 150 mg by mouth every other day.    . Iron-FA-B Cmp-C-Biot-Probiotic (FUSION PLUS) CAPS Take 1 capsule by mouth daily.    Marland Kitchen linaclotide (LINZESS) 145 MCG CAPS capsule Take 1 capsule (145 mcg total) by mouth daily before breakfast. 30 capsule 2  . Multiple Vitamins-Minerals (MULTIVITAMIN ADULT) TABS Take 1 tablet by mouth daily. 30 tablet 0  . norethindrone (AYGESTIN) 5 MG tablet Take 1 tablet (5 mg total) by mouth daily. 30 tablet 2  . Phentermine-Topiramate (QSYMIA) 7.5-46 MG CP24 Take 1 tablet by mouth daily. 30 capsule 2  . PREVIDENT 5000 BOOSTER PLUS 1.1 % PSTE   2  . ibuprofen (ADVIL,MOTRIN) 800 MG tablet Take 1 tablet (800 mg total) by mouth every 8 (eight) hours as needed. (Patient not taking: Reported on 03/27/2019) 60 tablet 3   No current facility-administered medications for this visit.     Review of Systems  Constitutional: Positive for malaise/fatigue (severe) and weight loss (3lbs). Negative for chills, diaphoresis and fever.       Doing "okay."  HENT: Negative.  Negative for congestion, ear pain, hearing loss, nosebleeds, sinus pain and sore throat.   Eyes: Negative.  Negative for blurred vision, double vision and photophobia.  Respiratory: Negative.  Negative for cough, shortness of breath and wheezing.   Cardiovascular: Positive for leg swelling (BLE). Negative for chest pain, palpitations and PND.  Gastrointestinal: Negative.  Negative for abdominal pain, blood in stool, constipation, diarrhea, heartburn, nausea and vomiting.       Ice pica. Eating moderately well.  Genitourinary: Negative.  Negative for dysuria, frequency, hematuria and urgency.       Menses continue to be heavy.  Musculoskeletal: Negative.  Negative for joint pain, myalgias and neck pain.  Skin: Negative.   Negative for itching and rash.  Neurological: Negative for dizziness, tingling, sensory change, weakness and headaches.  Endo/Heme/Allergies: Negative.  Does not bruise/bleed easily.  Psychiatric/Behavioral: Negative for depression, memory loss and substance abuse. The patient has insomnia (difficulty sleeping following 2 deaths in her family). The patient is not nervous/anxious.   All other systems reviewed and are negative.  Performance status (ECOG): 1  Vitals Blood pressure 107/71, pulse 74, temperature (!) 97.1 F (36.2 C), temperature source Tympanic, height 5\' 4"  (1.626 m), weight 226 lb 4.8 oz (102.7 kg), SpO2 100 %.   Physical Exam  Constitutional: She is oriented to person, place, and time. She appears well-developed and well-nourished. No distress.  HENT:  Head: Normocephalic and atraumatic.  Mouth/Throat: Oropharynx is clear and moist. No oropharyngeal exudate.  Black hair. Wearing mask.  Eyes: Pupils are equal, round, and reactive to light. Conjunctivae and EOM are normal. No scleral icterus.  Brown eyes.  Neck: Normal range of motion. Neck supple.  Cardiovascular: Normal rate, regular rhythm and normal heart  sounds. Exam reveals no gallop and no friction rub.  No murmur heard. Pulmonary/Chest: Effort normal and breath sounds normal. No respiratory distress. She has no wheezes. She has no rales.  Abdominal: Soft. Bowel sounds are normal. She exhibits no distension. There is no abdominal tenderness. There is no rebound and no guarding.  Musculoskeletal: Normal range of motion.        General: Tenderness (RLE) and edema (BLE) present.  Lymphadenopathy:    She has no cervical adenopathy.    She has no axillary adenopathy.       Right: No supraclavicular adenopathy present.       Left: No supraclavicular adenopathy present.  Neurological: She is alert and oriented to person, place, and time. She has normal reflexes.  Skin: Skin is warm and dry. She is not diaphoretic. No  erythema.  Psychiatric: She has a normal mood and affect. Her behavior is normal. Judgment and thought content normal.  Nursing note and vitals reviewed.   No visits with results within 3 Day(s) from this visit.  Latest known visit with results is:  Orders Only on 03/20/2019  Component Date Value Ref Range Status  . Folate 03/20/2019 8.3  >5.9 ng/mL Final   Performed at [facility], [address] [place], [place] [address]  . Ferritin 03/20/2019 5* 11 - 307 ng/mL Final   Performed at [facility], [address] [place], [place] [address]  . WBC 03/20/2019 6.2  4.0 - 10.5 K/uL Final  . RBC 03/20/2019 3.92  3.87 - 5.11 MIL/uL Final  . Hemoglobin 03/20/2019 8.6* 12.0 - 15.0 g/dL Final   Comment: Reticulocyte Hemoglobin testing may be clinically indicated, consider ordering this additional test [id number]   . HCT 03/20/2019 28.7* 36.0 - 46.0 % Final  . MCV 03/20/2019 73.2* 80.0 - 100.0 fL Final  . MCH 03/20/2019 21.9* 26.0 - 34.0 pg Final  . MCHC 03/20/2019 30.0  30.0 - 36.0 g/dL Final  . RDW 03/20/2019 18.9* 11.5 - 15.5 % Final  . Platelets 03/20/2019 458* 150 - 400 K/uL Final  . nRBC 03/20/2019 0.0  0.0 - 0.2 % Final  . Neutrophils Relative % 03/20/2019 48  % Final  . Neutro Abs 03/20/2019 3.0  1.7 - 7.7 K/uL Final  . Lymphocytes Relative 03/20/2019 38  % Final  . Lymphs Abs 03/20/2019 2.3  0.7 - 4.0 K/uL Final  . Monocytes Relative 03/20/2019 11  % Final  . Monocytes Absolute 03/20/2019 0.7  0.1 - 1.0 K/uL Final  . Eosinophils Relative 03/20/2019 1  % Final  . Eosinophils Absolute 03/20/2019 0.1  0.0 - 0.5 K/uL Final  . Basophils Relative 03/20/2019 1  % Final  . Basophils Absolute 03/20/2019 0.0  0.0 - 0.1 K/uL Final  . Immature Granulocytes 03/20/2019 1  % Final  . Abs Immature Granulocytes 03/20/2019 0.05  0.00 - 0.07 K/uL Final   Performed at [facility], [address] [place], [place] [address]  . Vitamin B-12 03/20/2019 263  180 - 914  pg/mL Final   Comment: (NOTE) This assay is not validated for testing neonatal or myeloproliferative syndrome specimens for Vitamin B12 levels. Performed at [facility], [place] [address], [place], [place] [address]     Assessment:  REEGAN MCTIGHE is a 43 y.o. female withiron deficiency anemiasecondary to heavy menses. She hasa history of on and off anemia for several years (before 02/2014). Dietis fairly good. She denies any melena, hematochezia, or hematuria. She has  hadice picax 2 months. She has a history of unresponsiveness to oral iron (4 month trial in 2015).  Labs on 11/30/2017revealed a hematocrit of 30.9, hemoglobin 9.3, MCV 73.4, platelets 675,000, white count 5300 with an ANC of 2650. Creatinine was0.7. Liver function tests were normal. Iron studies included a ferritin of 6, iron saturation 6% and TIBC of 440. HIV testing was negative.  Labs on 01/04/2018revealed a hematocrit of 28.3, hemoglobin 9.2, and MCV 69.6. Ferritin was 5.  She received Feraheme510 mg IV on 08/29/2016 and 09/05/2016, 04/13/2018, 04/20/2018, 09/19/2018, and 12/20/2018. Ferritin goalis 100.  Ferritinhas been followed: 18 on 12/02/2015, 6 on 07/21/2016, 5 on 08/25/2016, 214 on 09/21/2016, 97 on 10/10/2016, 21 on 12/08/2016, 12 on 02/09/2017, 4 on 03/23/2018, 4 on 04/13/2018, 9 on 07/10/2018, 6 on 08/03/2018, and 5 on 09/19/2018, 6 on 12/18/2018, and 5 on 03/20/2019.  She has B12 deficiency. She received B12 shots x 2-3 months in the past. B12 was 462 on 07/21/2016, 434 on 12/14/2017, and 263 on 03/20/2019.  She began on oral B12 on 03/20/2019.  Symptomatically, she is fatigued.  She denies any melena or hematochezia.  She has ice pica.  Exam is stable.  Plan: 1.   Review labs from 03/20/2019. 2.  Iron deficiency anemia Hematocrit 28.7.  Hemoglobin 8.6.  MCV 73.2.               Ferritin 5.               Etiology secondary to menorrhagia. Feraheme  today and in 1 week. 3.   B12 deficiency             Patient prefers B12 injections over oral B12.             B12 today and monthly x 6..             Check folate annually. 4.   RTC in 6 weeks for labs (CBC with diff, retic, ferritin). 5.   RTC in 3 months for MD assessment, labs (CBC with with diff, ferritin, iron studies- day before), and +/- Feraheme.    I discussed the assessment and treatment plan with the patient.  The patient was provided an opportunity to ask questions and all were answered.  The patient agreed with the plan and demonstrated an understanding of the instructions.  The patient was advised to call back if the symptoms worsen or if the condition fails to improve as anticipated.  I provided 15 minutes of face-to-face time during this this encounter and > 50% was spent counseling as documented under my assessment and plan.    Lequita Asal, MD, PhD    03/27/2019, 8:56 AM  I, Cloyde Reams Dorshimer, am acting as Education administrator for [employer]. Mike Gip, MD, PhD.  I, Melissa C. Mike Gip, MD, have reviewed the above documentation for accuracy and completeness, and I agree with the above.

## 2019-03-26 ENCOUNTER — Other Ambulatory Visit: Payer: Self-pay

## 2019-03-27 ENCOUNTER — Inpatient Hospital Stay
Payer: No Typology Code available for payment source | Attending: Hematology and Oncology | Admitting: Hematology and Oncology

## 2019-03-27 ENCOUNTER — Encounter: Payer: Self-pay | Admitting: Hematology and Oncology

## 2019-03-27 ENCOUNTER — Inpatient Hospital Stay: Payer: No Typology Code available for payment source

## 2019-03-27 VITALS — BP 107/71 | HR 74 | Temp 97.1°F | Ht 64.0 in | Wt 226.3 lb

## 2019-03-27 DIAGNOSIS — N920 Excessive and frequent menstruation with regular cycle: Secondary | ICD-10-CM | POA: Insufficient documentation

## 2019-03-27 DIAGNOSIS — Z793 Long term (current) use of hormonal contraceptives: Secondary | ICD-10-CM | POA: Diagnosis not present

## 2019-03-27 DIAGNOSIS — Z803 Family history of malignant neoplasm of breast: Secondary | ICD-10-CM | POA: Diagnosis not present

## 2019-03-27 DIAGNOSIS — E538 Deficiency of other specified B group vitamins: Secondary | ICD-10-CM | POA: Insufficient documentation

## 2019-03-27 DIAGNOSIS — R5383 Other fatigue: Secondary | ICD-10-CM | POA: Diagnosis not present

## 2019-03-27 DIAGNOSIS — R600 Localized edema: Secondary | ICD-10-CM | POA: Diagnosis not present

## 2019-03-27 DIAGNOSIS — K589 Irritable bowel syndrome without diarrhea: Secondary | ICD-10-CM | POA: Insufficient documentation

## 2019-03-27 DIAGNOSIS — Z634 Disappearance and death of family member: Secondary | ICD-10-CM | POA: Diagnosis not present

## 2019-03-27 DIAGNOSIS — D500 Iron deficiency anemia secondary to blood loss (chronic): Secondary | ICD-10-CM | POA: Diagnosis not present

## 2019-03-27 DIAGNOSIS — G479 Sleep disorder, unspecified: Secondary | ICD-10-CM | POA: Insufficient documentation

## 2019-03-27 DIAGNOSIS — Z791 Long term (current) use of non-steroidal anti-inflammatories (NSAID): Secondary | ICD-10-CM | POA: Diagnosis not present

## 2019-03-27 DIAGNOSIS — D509 Iron deficiency anemia, unspecified: Secondary | ICD-10-CM | POA: Insufficient documentation

## 2019-03-27 DIAGNOSIS — K219 Gastro-esophageal reflux disease without esophagitis: Secondary | ICD-10-CM | POA: Diagnosis not present

## 2019-03-27 DIAGNOSIS — Z8249 Family history of ischemic heart disease and other diseases of the circulatory system: Secondary | ICD-10-CM | POA: Insufficient documentation

## 2019-03-27 DIAGNOSIS — Z79899 Other long term (current) drug therapy: Secondary | ICD-10-CM | POA: Insufficient documentation

## 2019-03-27 LAB — PREGNANCY, URINE: Preg Test, Ur: NEGATIVE

## 2019-03-27 MED ORDER — SODIUM CHLORIDE 0.9 % IV SOLN
Freq: Once | INTRAVENOUS | Status: AC
  Administered 2019-03-27: 09:00:00 via INTRAVENOUS
  Filled 2019-03-27: qty 250

## 2019-03-27 MED ORDER — CYANOCOBALAMIN 1000 MCG/ML IJ SOLN
1000.0000 ug | Freq: Once | INTRAMUSCULAR | Status: AC
  Administered 2019-03-27: 10:00:00 1000 ug via INTRAMUSCULAR
  Filled 2019-03-27: qty 1

## 2019-03-27 MED ORDER — SODIUM CHLORIDE 0.9 % IV SOLN
510.0000 mg | Freq: Once | INTRAVENOUS | Status: AC
  Administered 2019-03-27: 510 mg via INTRAVENOUS
  Filled 2019-03-27: qty 17

## 2019-03-27 NOTE — Progress Notes (Signed)
The patient c/o edema to bilateral ankle.

## 2019-03-27 NOTE — Patient Instructions (Signed)
Ferumoxytol injection What is this medicine? FERUMOXYTOL is an iron complex. Iron is used to make healthy red blood cells, which carry oxygen and nutrients throughout the body. This medicine is used to treat iron deficiency anemia. This medicine may be used for other purposes; ask your health care provider or pharmacist if you have questions. COMMON BRAND NAME(S): Feraheme What should I tell my health care provider before I take this medicine? They need to know if you have any of these conditions:  anemia not caused by low iron levels  high levels of iron in the blood  magnetic resonance imaging (MRI) test scheduled  an unusual or allergic reaction to iron, other medicines, foods, dyes, or preservatives  pregnant or trying to get pregnant  breast-feeding How should I use this medicine? This medicine is for injection into a vein. It is given by a health care professional in a hospital or clinic setting. Talk to your pediatrician regarding the use of this medicine in children. Special care may be needed. Overdosage: If you think you have taken too much of this medicine contact a poison control center or emergency room at once. NOTE: This medicine is only for you. Do not share this medicine with others. What if I miss a dose? It is important not to miss your dose. Call your doctor or health care professional if you are unable to keep an appointment. What may interact with this medicine? This medicine may interact with the following medications:  other iron products This list may not describe all possible interactions. Give your health care provider a list of all the medicines, herbs, non-prescription drugs, or dietary supplements you use. Also tell them if you smoke, drink alcohol, or use illegal drugs. Some items may interact with your medicine. What should I watch for while using this medicine? Visit your doctor or healthcare professional regularly. Tell your doctor or healthcare  professional if your symptoms do not start to get better or if they get worse. You may need blood work done while you are taking this medicine. You may need to follow a special diet. Talk to your doctor. Foods that contain iron include: whole grains/cereals, dried fruits, beans, or peas, leafy green vegetables, and organ meats (liver, kidney). What side effects may I notice from receiving this medicine? Side effects that you should report to your doctor or health care professional as soon as possible:  allergic reactions like skin rash, itching or hives, swelling of the face, lips, or tongue  breathing problems  changes in blood pressure  feeling faint or lightheaded, falls  fever or chills  flushing, sweating, or hot feelings  swelling of the ankles or feet Side effects that usually do not require medical attention (report to your doctor or health care professional if they continue or are bothersome):  diarrhea  headache  nausea, vomiting  stomach pain This list may not describe all possible side effects. Call your doctor for medical advice about side effects. You may report side effects to FDA at 1-800-FDA-1088. Where should I keep my medicine? This drug is given in a hospital or clinic and will not be stored at home. NOTE: This sheet is a summary. It may not cover all possible information. If you have questions about this medicine, talk to your doctor, pharmacist, or health care provider.  2020 Elsevier/Gold Standard (2016-09-26 20:21:10)  Cyanocobalamin, Vitamin B12 injection What is this medicine? CYANOCOBALAMIN (sye an oh koe BAL a min) is a man made form of vitamin   B12. Vitamin B12 is used in the growth of healthy blood cells, nerve cells, and proteins in the body. It also helps with the metabolism of fats and carbohydrates. This medicine is used to treat people who can not absorb vitamin B12. This medicine may be used for other purposes; ask your health care provider or  pharmacist if you have questions. COMMON BRAND NAME(S): B-12 Compliance Kit, B-12 Injection Kit, Cyomin, LA-12, Nutri-Twelve, Physicians EZ Use B-12, Primabalt What should I tell my health care provider before I take this medicine? They need to know if you have any of these conditions:  kidney disease  Leber's disease  megaloblastic anemia  an unusual or allergic reaction to cyanocobalamin, cobalt, other medicines, foods, dyes, or preservatives  pregnant or trying to get pregnant  breast-feeding How should I use this medicine? This medicine is injected into a muscle or deeply under the skin. It is usually given by a health care professional in a clinic or doctor's office. However, your doctor may teach you how to inject yourself. Follow all instructions. Talk to your pediatrician regarding the use of this medicine in children. Special care may be needed. Overdosage: If you think you have taken too much of this medicine contact a poison control center or emergency room at once. NOTE: This medicine is only for you. Do not share this medicine with others. What if I miss a dose? If you are given your dose at a clinic or doctor's office, call to reschedule your appointment. If you give your own injections and you miss a dose, take it as soon as you can. If it is almost time for your next dose, take only that dose. Do not take double or extra doses. What may interact with this medicine?  colchicine  heavy alcohol intake This list may not describe all possible interactions. Give your health care provider a list of all the medicines, herbs, non-prescription drugs, or dietary supplements you use. Also tell them if you smoke, drink alcohol, or use illegal drugs. Some items may interact with your medicine. What should I watch for while using this medicine? Visit your doctor or health care professional regularly. You may need blood work done while you are taking this medicine. You may need to  follow a special diet. Talk to your doctor. Limit your alcohol intake and avoid smoking to get the best benefit. What side effects may I notice from receiving this medicine? Side effects that you should report to your doctor or health care professional as soon as possible:  allergic reactions like skin rash, itching or hives, swelling of the face, lips, or tongue  blue tint to skin  chest tightness, pain  difficulty breathing, wheezing  dizziness  red, swollen painful area on the leg Side effects that usually do not require medical attention (report to your doctor or health care professional if they continue or are bothersome):  diarrhea  headache This list may not describe all possible side effects. Call your doctor for medical advice about side effects. You may report side effects to FDA at 1-800-FDA-1088. Where should I keep my medicine? Keep out of the reach of children. Store at room temperature between 15 and 30 degrees C (59 and 85 degrees F). Protect from light. Throw away any unused medicine after the expiration date. NOTE: This sheet is a summary. It may not cover all possible information. If you have questions about this medicine, talk to your doctor, pharmacist, or health care provider.  2020 Elsevier/Gold   Standard (2007-11-19 22:10:20)   

## 2019-04-03 ENCOUNTER — Ambulatory Visit: Payer: No Typology Code available for payment source

## 2019-04-05 ENCOUNTER — Inpatient Hospital Stay: Payer: No Typology Code available for payment source

## 2019-04-09 ENCOUNTER — Other Ambulatory Visit: Payer: Self-pay

## 2019-04-09 ENCOUNTER — Encounter: Payer: Self-pay | Admitting: Obstetrics and Gynecology

## 2019-04-09 ENCOUNTER — Ambulatory Visit (INDEPENDENT_AMBULATORY_CARE_PROVIDER_SITE_OTHER): Payer: No Typology Code available for payment source | Admitting: Obstetrics and Gynecology

## 2019-04-09 ENCOUNTER — Telehealth: Payer: Self-pay

## 2019-04-09 VITALS — BP 130/87 | HR 80 | Ht 64.0 in | Wt 225.5 lb

## 2019-04-09 DIAGNOSIS — Z86018 Personal history of other benign neoplasm: Secondary | ICD-10-CM

## 2019-04-09 DIAGNOSIS — N920 Excessive and frequent menstruation with regular cycle: Secondary | ICD-10-CM | POA: Diagnosis not present

## 2019-04-09 DIAGNOSIS — D500 Iron deficiency anemia secondary to blood loss (chronic): Secondary | ICD-10-CM | POA: Diagnosis not present

## 2019-04-09 DIAGNOSIS — K429 Umbilical hernia without obstruction or gangrene: Secondary | ICD-10-CM

## 2019-04-09 NOTE — Progress Notes (Signed)
Pt is present to discuss fibroid surgery. Pt stated that she was tired of the heavy bleeding and is ready for surgery.

## 2019-04-09 NOTE — Progress Notes (Signed)
GYNECOLOGY PROGRESS NOTE  Subjective:    Patient ID: Sharon Herring, female    DOB: Jan 05, 1976, 43 y.o.   MRN: [id number]  HPI  Patient is a 43 y.o. G0P0000 female who presents for discussion of surgical intervention for fibroid uterus and menorrhagia.  Patient desired surgery (laparoscopic myomectomy) in March, however due to [place] had been postponed.  She has been trying to manage her symptoms medically with Aygestin.  Notes that the medication had been working well for her up until her most recent menstrual cycle, where she began noting a heavier period with clotting, lasted 7-8 days.    Of note, patient states that she is also planning to have surgery to repair her hernia in September. Desires to know if she can have both surgeries perform at the same time. Dr. Dahlia Byes will be repairing hernia laparoscopically (robotic).  She does state that she is planning to see if surgery can be moved to October for financial and job reasons.   The following portions of the patient's history were reviewed and updated as appropriate: allergies, current medications, past family history, past medical history, past social history, past surgical history and problem list.  Review of Systems Pertinent items noted in HPI and remainder of comprehensive ROS otherwise negative.   Objective:   Blood pressure 130/87, pulse 80, height 5\' 4"  (1.626 m), weight 225 lb 8 oz (102.3 kg), last menstrual period 03/20/2019. General appearance: alert and no distress  Abdomen: soft, non-tender. Umbilical hernia present.  Exam deferred.    Imaging: US PELVIS TRANSVANGINAL NON-OB (TV ONLY) Patient Name: Sharon Herring DOB: 1976-07-09 MRN: [id number]   ULTRASOUND REPORT  Location: [facility]  Date of Service: 11/07/2018   Indications:Fibroids Findings:  The uterus is anteverted and measures 127.9 mm. Echo texture is homogenous with evidence of focal masses. Within the uterus are multiple suspected fibroids  measuring: Fibroid 1:35.9 x 35.9 mm posterior Fibroid 2:42.6 x42.6 mm anterior Fibroid 3: 36.0 x 36.0 mm fundal The Endometrium measures 8  mm.  Right Ovary measures 1.39 x 1.53 x .9 cm. It is normal in appearance.  Left Ovary measures 2.38 x 1.41 x .1.58 cm. It is normal in appearance. Survey of the adnexa demonstrates no adnexal masses. There is no free fluid in the cul de sac.  Impression: The uterus is anteverted and measures 127.9 mm. Echo texture is homogenous with evidence of focal masses. Within the uterus are multiple suspected fibroids measuring: Fibroid 1:35.9 x 35.9 mm posterior Fibroid 2:42.6 x42.6 mm anterior Fibroid 3: 36.0 x 36.0 mm fundal  Recommendations: 1.Clinical correlation with the patient's History and Physical Exam.  Lillia Dallas  RDMS  I have reviewed this study and agree with documented findings.   Rubie Maid, MD [facility]   Assessment:   History of uterine fibroid History of iron deficiency anemia due to chronic blood loss Menorrhagia with regular cycle  Umbilical hernia without obstruction and without gangrene   Plan:   - Patient scheduled for hernia repair in September (but plans to change to October).  Will contact General Surgery to see if joint case can be performed (Roboitc myomectomy and hernia repair) - Discussed that since patient's last ultrasound was performed almost 6 months ago, will need to reassess size of fibroids to ensure no significant changes to surgical procedure would be indicated.  Will order.  - Menorrhagia previously managed with Aygestin. Patient advised that if heavier bleeding continues, can increase to 10 mg dosing (  currently on 5 mg daily) until surgery. Also will perform D&C at time of surgery to assess uterine tissue.  - Menorrhagia with normal cycle.  Will have patient get urgent referral to Hematology for iron infusion, last H/H was 8.6/28.7 on 03/20/19.  Has seen Hematology last week for iron  infusion. Will continue to follow up.   RTC in 1 month for ultrasound and pre-op appointment.   Rubie Maid, MD [facility]

## 2019-04-09 NOTE — Telephone Encounter (Signed)
Patient called and is scheduled for surgery with Dr Dahlia Byes on 05/07/19. She would like to reschedule this to the week of October 26th and have surgery coordinated with Dr Marcelline Mates at [facility].

## 2019-04-11 NOTE — Telephone Encounter (Signed)
Waiting to hear back from [place] at [facility]. She needs to check with Dr. Marcelline Mates and will get back with [name].

## 2019-04-11 NOTE — Telephone Encounter (Signed)
Dr. Marcelline Mates would be available on 06-20-19.   Patient aware. She will come in for a pre-op visit on 06-10-19 at 9:15 am with Dr. Dahlia Byes.   COVID test will be on 06-17-19.  Patient verbalizes understanding of the above.

## 2019-04-24 ENCOUNTER — Inpatient Hospital Stay: Payer: No Typology Code available for payment source

## 2019-04-30 ENCOUNTER — Other Ambulatory Visit: Payer: No Typology Code available for payment source

## 2019-05-01 ENCOUNTER — Ambulatory Visit: Payer: No Typology Code available for payment source | Admitting: Surgery

## 2019-05-03 ENCOUNTER — Other Ambulatory Visit: Payer: Self-pay

## 2019-05-03 ENCOUNTER — Other Ambulatory Visit: Payer: No Typology Code available for payment source

## 2019-05-03 ENCOUNTER — Inpatient Hospital Stay: Payer: No Typology Code available for payment source | Attending: Hematology and Oncology

## 2019-05-03 VITALS — BP 123/76 | HR 77 | Temp 96.5°F | Resp 18

## 2019-05-03 DIAGNOSIS — D509 Iron deficiency anemia, unspecified: Secondary | ICD-10-CM | POA: Diagnosis present

## 2019-05-03 MED ORDER — SODIUM CHLORIDE 0.9% FLUSH
10.0000 mL | Freq: Once | INTRAVENOUS | Status: DC | PRN
  Filled 2019-05-03: qty 10

## 2019-05-03 MED ORDER — SODIUM CHLORIDE 0.9 % IV SOLN
Freq: Once | INTRAVENOUS | Status: AC
  Administered 2019-05-03: 09:00:00 via INTRAVENOUS
  Filled 2019-05-03: qty 250

## 2019-05-03 MED ORDER — SODIUM CHLORIDE 0.9 % IV SOLN
510.0000 mg | Freq: Once | INTRAVENOUS | Status: AC
  Administered 2019-05-03: 510 mg via INTRAVENOUS
  Filled 2019-05-03: qty 17

## 2019-05-03 MED ORDER — SODIUM CHLORIDE 0.9% FLUSH
3.0000 mL | Freq: Once | INTRAVENOUS | Status: DC | PRN
  Filled 2019-05-03: qty 3

## 2019-05-03 NOTE — Patient Instructions (Signed)

## 2019-05-07 ENCOUNTER — Encounter: Payer: No Typology Code available for payment source | Admitting: Obstetrics and Gynecology

## 2019-05-07 ENCOUNTER — Other Ambulatory Visit: Payer: No Typology Code available for payment source

## 2019-05-08 ENCOUNTER — Other Ambulatory Visit: Payer: Self-pay

## 2019-05-08 ENCOUNTER — Encounter: Payer: Self-pay | Admitting: Family Medicine

## 2019-05-08 ENCOUNTER — Ambulatory Visit (INDEPENDENT_AMBULATORY_CARE_PROVIDER_SITE_OTHER): Payer: No Typology Code available for payment source | Admitting: Family Medicine

## 2019-05-08 ENCOUNTER — Inpatient Hospital Stay: Payer: No Typology Code available for payment source | Attending: Hematology and Oncology

## 2019-05-08 VITALS — Temp 101.6°F

## 2019-05-08 DIAGNOSIS — J029 Acute pharyngitis, unspecified: Secondary | ICD-10-CM

## 2019-05-08 DIAGNOSIS — R509 Fever, unspecified: Secondary | ICD-10-CM

## 2019-05-08 DIAGNOSIS — Z20822 Contact with and (suspected) exposure to covid-19: Secondary | ICD-10-CM

## 2019-05-08 DIAGNOSIS — Z20828 Contact with and (suspected) exposure to other viral communicable diseases: Secondary | ICD-10-CM

## 2019-05-08 DIAGNOSIS — R059 Cough, unspecified: Secondary | ICD-10-CM

## 2019-05-08 DIAGNOSIS — R05 Cough: Secondary | ICD-10-CM

## 2019-05-08 MED ORDER — AZITHROMYCIN 250 MG PO TABS: ORAL_TABLET | ORAL | 0 refills | Status: DC

## 2019-05-08 MED ORDER — PREDNISONE 20 MG PO TABS: 20.0000 mg | ORAL_TABLET | Freq: Two times a day (BID) | ORAL | 0 refills | Status: DC

## 2019-05-08 NOTE — Progress Notes (Signed)
Name: NYJAH CHOUDHURY   MRN: [id number]    DOB: Dec 21, 1975   Date:05/08/2019       Progress Note  Subjective  Chief Complaint  Chief Complaint  Patient presents with  . Fever    High fever since Tuesday-was tested for COVID but was Negative  . Sore Throat  . Cough    Unable to get up the mucus  . Fatigue    States she feels really bad    I connected with  Rosangela L Green  on 05/08/19 at  1:00 PM EDT by a video enabled telemedicine application and verified that I am speaking with the correct person using two identifiers.  I discussed the limitations of evaluation and management by telemedicine and the availability of in person appointments. The patient expressed understanding and agreed to proceed. Staff also discussed with the patient that there may be a patient responsible charge related to this service. Patient Location: at home  Provider Location: [place]   HPI  Sick: she sates mother diagnosed with COVID-19 on Saturday pm, she was with her Saturday am, she was fine on Sunday, but Monday am she felt tired, some nasal congestion. 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 Since Monday she has been feeling worse, with body aches, eyelids are sore to touch, high fever 101.4 and chills. She also has a sore throat, a wet cough but unable to bring up phlegm, and is extremely tired. She has been able to eat and drink. No diarrhea.    Patient Active Problem List   Diagnosis Date Noted  . Lymphadenopathy 01/16/2018  . Moderate obstructive sleep apnea 12/19/2017  . Swelling of limb 01/27/2017  . B12 deficiency 03/28/2016  . Vitamin D deficiency 03/28/2016  . Hyperglycemia 03/28/2016  . Multiple gastric polyps 01/08/2016  . Menorrhagia 11/13/2015  . Chronic constipation 09/23/2015  . Obesity, Class I, BMI 30.0-34.9 (see actual BMI) 06/02/2015  . Iron deficiency anemia 03/14/2014  . History of uterine fibroid 03/14/2014    Past Surgical  History:  Procedure Laterality Date  . APPENDECTOMY  2012  . ESOPHAGOGASTRODUODENOSCOPY (EGD) WITH PROPOFOL N/A 01/06/2016   Procedure: ESOPHAGOGASTRODUODENOSCOPY (EGD) WITH PROPOFOL;  Surgeon: Robert Bellow, MD;  Location: [facility];  Service: Endoscopy;  Laterality: N/A;  . ROBOTIC ASSISTED LAPAROSCOPIC CHOLECYSTECTOMY N/A 03/03/2016   Procedure: ROBOTIC ASSISTED LAPAROSCOPIC CHOLECYSTECTOMY ;  Surgeon: Clayburn Pert, MD;  Location: [facility] ORS;  Service: General;  Laterality: N/A;  . UTERINE FIBROID SURGERY  2012    Family History  Problem Relation Age of Onset  . Hypertension Mother   . Hypertension Father   . Pancreatic cancer Father 25  . Breast cancer Neg Hx   . Colon cancer Neg Hx     Social History   Socioeconomic History  . Marital status: Single    Spouse name: Not on file  . Number of children: 0  . Years of education: Not on file  . Highest education level: Bachelor's degree (e.g., BA, AB, BS)  Occupational History  . Occupation: CMA  Social Needs  . Financial resource strain: Not hard at all  . Food insecurity    Worry: Never true    Inability: Never true  . Transportation needs    Medical: No    Non-medical: No  Tobacco Use  . Smoking status: Never Smoker  . Smokeless tobacco: Never Used  Substance and Sexual Activity  . Alcohol use: No  Alcohol/week: 0.0 standard drinks  . Drug use: No  . Sexual activity: Not Currently    Partners: Male    Birth control/protection: Condom  Lifestyle  . Physical activity    Days per week: 7 days    Minutes per session: 30 min  . Stress: Not at all  Relationships  . Social connections    Talks on phone: More than three times a week    Gets together: More than three times a week    Attends religious service: More than 4 times per year    Active member of club or organization: Yes    Attends meetings of clubs or organizations: More than 4 times per year    Relationship status: Never married  . Intimate  partner violence    Fear of current or ex partner: No    Emotionally abused: No    Physically abused: No    Forced sexual activity: No  Other Topics Concern  . Not on file  Social History Narrative   Lives alone, works for [employer] at the [employer] , but switching to [employer]      Current Outpatient Medications:  .  fluticasone (FLONASE) 50 MCG/ACT nasal spray, Place 2 sprays into both nostrils daily., Disp: 16 g, Rfl: 6 .  furosemide (LASIX) 20 MG tablet, Take 1 tablet (20 mg total) by mouth daily. (Patient taking differently: Take 20 mg by mouth daily as needed. ), Disp: 30 tablet, Rfl: 1 .  ibuprofen (ADVIL,MOTRIN) 800 MG tablet, Take 1 tablet (800 mg total) by mouth every 8 (eight) hours as needed., Disp: 60 tablet, Rfl: 3 .  iron polysaccharides (NIFEREX) 150 MG capsule, Take 150 mg by mouth every other day., Disp: , Rfl:  .  Iron-FA-B Cmp-C-Biot-Probiotic (FUSION PLUS) CAPS, Take 1 capsule by mouth daily., Disp: , Rfl:  .  linaclotide (LINZESS) 145 MCG CAPS capsule, Take 1 capsule (145 mcg total) by mouth daily before breakfast., Disp: 30 capsule, Rfl: 2 .  Multiple Vitamins-Minerals (MULTIVITAMIN ADULT) TABS, Take 1 tablet by mouth daily., Disp: 30 tablet, Rfl: 0 .  norethindrone (AYGESTIN) 5 MG tablet, Take 1 tablet (5 mg total) by mouth daily., Disp: 30 tablet, Rfl: 2 .  PREVIDENT 5000 BOOSTER PLUS 1.1 % PSTE, , Disp: , Rfl: 2  Allergies  Allergen Reactions  . Aspirin Nausea And Vomiting  . Oxycodone-Acetaminophen Rash and Shortness Of Breath  . Lexapro [Escitalopram] Palpitations  . Tape Other (See Comments)    Burning feeling. Has left mark on skin.  Melynda Keller [Lorcaserin Hcl] Other (See Comments)    headache  . Contrave [Naltrexone-Bupropion Hcl Er]   . Dicyclomine Nausea And Vomiting  . Norethindrone Rash    I personally reviewed active problem list, medication list, allergies, family history, social history with the patient/caregiver today.   ROS  Ten systems reviewed  and is negative except as mentioned in HPI   Objective  Virtual encounter, vitals not obtained.  There is no height or weight on file to calculate BMI.  Physical Exam  Awake, alert and oriented in no apparent distress   PHQ2/9: Depression screen [facility] 2/9 05/08/2019 01/22/2019 01/22/2019 10/17/2018 08/07/2018  Decreased Interest 0 0 0 0 0  Down, Depressed, Hopeless 0 0 0 0 0  PHQ - 2 Score 0 0 0 0 0  Altered sleeping 0 0 0 0 1  Tired, decreased energy 0 2 0 2 1  Change in appetite 0 0 0 0 0  Feeling bad or  failure about yourself  0 0 0 0 0  Trouble concentrating 0 0 0 0 0  Moving slowly or fidgety/restless 0 0 0 0 0  Suicidal thoughts 0 0 0 0 0  PHQ-9 Score 0 2 0 2 2  Difficult doing work/chores Not difficult at all Not difficult at all - Not difficult at all Not difficult at all  Some recent data might be hidden   PHQ-2/9 Result is negative.    Fall Risk: Fall Risk  05/08/2019 01/22/2019 10/17/2018 08/07/2018 07/17/2018  Falls in the past year? 0 0 0 0 0  Number falls in past yr: 0 0 - 0 0  Injury with Fall? 0 0 - 0 -     Assessment & Plan  1. Cough  Covering for CAP and also viral infection  - azithromycin (ZITHROMAX) 250 MG tablet; Take as directed  Dispense: 6 tablet; Refill: 0 - predniSONE (DELTASONE) 20 MG tablet; Take 1 tablet (20 mg total) by mouth 2 (two) times daily with a meal.  Dispense: 10 tablet; Refill: 0 - Novel Coronavirus, NAA ([facility])  2. Fever and chills  - azithromycin (ZITHROMAX) 250 MG tablet; Take as directed  Dispense: 6 tablet; Refill: 0 - predniSONE (DELTASONE) 20 MG tablet; Take 1 tablet (20 mg total) by mouth 2 (two) times daily with a meal.  Dispense: 10 tablet; Refill: 0 - Novel Coronavirus, NAA ([facility])  3. Sore throat  - Novel Coronavirus, NAA ([facility])  4. Exposure to Covid-19 Virus  On Saturday the 12th, symptoms started on Monday the 14th, tested on Tuesday and per patient negative screening, I cannot see results. Explained it may  still be COVID-19, she needs to continue to self isolate, stay hydrated, avoid sedating medications, monitor for worsening of symptoms, dehydration and sob and call 911 if needed. Symptoms can progress up to day 10 when it tends to improve - Novel Coronavirus, NAA ([facility])  I discussed the assessment and treatment plan with the patient. The patient was provided an opportunity to ask questions and all were answered. The patient agreed with the plan and demonstrated an understanding of the instructions.  The patient was advised to call back or seek an in-person evaluation if the symptoms worsen or if the condition fails to improve as anticipated.  I provided 15 minutes of non-face-to-face time during this encounter.

## 2019-05-12 ENCOUNTER — Other Ambulatory Visit: Payer: Self-pay

## 2019-05-12 ENCOUNTER — Emergency Department: Payer: No Typology Code available for payment source

## 2019-05-12 ENCOUNTER — Emergency Department
Admission: EM | Admit: 2019-05-12 | Discharge: 2019-05-13 | Disposition: A | Discharge status: Home / Self Care | Payer: No Typology Code available for payment source | Attending: Emergency Medicine | Admitting: Emergency Medicine

## 2019-05-12 ENCOUNTER — Encounter: Payer: Self-pay | Admitting: Emergency Medicine

## 2019-05-12 DIAGNOSIS — R0602 Shortness of breath: Secondary | ICD-10-CM | POA: Diagnosis not present

## 2019-05-12 DIAGNOSIS — U071 COVID-19: Secondary | ICD-10-CM

## 2019-05-12 DIAGNOSIS — J129 Viral pneumonia, unspecified: Secondary | ICD-10-CM

## 2019-05-12 DIAGNOSIS — J22 Unspecified acute lower respiratory infection: Secondary | ICD-10-CM

## 2019-05-12 DIAGNOSIS — D509 Iron deficiency anemia, unspecified: Secondary | ICD-10-CM | POA: Diagnosis not present

## 2019-05-12 DIAGNOSIS — R070 Pain in throat: Secondary | ICD-10-CM | POA: Insufficient documentation

## 2019-05-12 DIAGNOSIS — R0789 Other chest pain: Secondary | ICD-10-CM | POA: Diagnosis not present

## 2019-05-12 DIAGNOSIS — M791 Myalgia, unspecified site: Secondary | ICD-10-CM | POA: Diagnosis present

## 2019-05-12 DIAGNOSIS — Z79899 Other long term (current) drug therapy: Secondary | ICD-10-CM | POA: Insufficient documentation

## 2019-05-12 LAB — COMPREHENSIVE METABOLIC PANEL
ALT: 14 U/L (ref 0–44)
AST: 18 U/L (ref 15–41)
Albumin: 3.7 g/dL (ref 3.5–5.0)
Alkaline Phosphatase: 61 U/L (ref 38–126)
Anion gap: 11 (ref 5–15)
BUN: 10 mg/dL (ref 6–20)
CO2: 25 mmol/L (ref 22–32)
Calcium: 8.9 mg/dL (ref 8.9–10.3)
Chloride: 103 mmol/L (ref 98–111)
Creatinine, Ser: 0.76 mg/dL (ref 0.44–1.00)
GFR calc Af Amer: >60 mL/min (ref >60)
GFR calc non Af Amer: >60 mL/min (ref >60)
Glucose, Bld: 93 mg/dL (ref 70–99)
Potassium: 3.9 mmol/L (ref 3.5–5.1)
Sodium: 139 mmol/L (ref 135–145)
Total Bilirubin: 0.6 mg/dL (ref 0.3–1.2)
Total Protein: 7.9 g/dL (ref 6.5–8.1)

## 2019-05-12 LAB — TROPONIN I (HIGH SENSITIVITY): Troponin I (High Sensitivity): 3 ng/L (ref <18)

## 2019-05-12 LAB — LIPASE, BLOOD: Lipase: 15 U/L (ref 11–51)

## 2019-05-12 NOTE — ED Triage Notes (Signed)
Pt arrived POV and ambulatory to the room without distress. Pt states that her mother tested + last Sunday and she was tested Monday with - result so she retested Wednesday with + result.   Pt now reports CP and SOB with increasing body aches and fever.

## 2019-05-12 NOTE — ED Notes (Signed)
HEPA filter machine on in room

## 2019-05-13 LAB — CBC WITH DIFFERENTIAL/PLATELET
Abs Immature Granulocytes: 0.03 10*3/uL (ref 0.00–0.07)
Basophils Absolute: 0 10*3/uL (ref 0.0–0.1)
Basophils Relative: 0 %
Eosinophils Absolute: 0 10*3/uL (ref 0.0–0.5)
Eosinophils Relative: 0 %
HCT: 35.8 % — ABNORMAL LOW (ref 36.0–46.0)
Hemoglobin: 11 g/dL — ABNORMAL LOW (ref 12.0–15.0)
Immature Granulocytes: 1 %
Lymphocytes Relative: 28 %
Lymphs Abs: 1.6 10*3/uL (ref 0.7–4.0)
MCH: 23.3 pg — ABNORMAL LOW (ref 26.0–34.0)
MCHC: 30.7 g/dL (ref 30.0–36.0)
MCV: 75.7 fL — ABNORMAL LOW (ref 80.0–100.0)
Monocytes Absolute: 0.6 10*3/uL (ref 0.1–1.0)
Monocytes Relative: 10 %
Neutro Abs: 3.5 10*3/uL (ref 1.7–7.7)
Neutrophils Relative %: 61 %
Platelets: 432 10*3/uL — ABNORMAL HIGH (ref 150–400)
RBC: 4.73 MIL/uL (ref 3.87–5.11)
RDW: 22.3 % — ABNORMAL HIGH (ref 11.5–15.5)
WBC: 5.7 10*3/uL (ref 4.0–10.5)
nRBC: 0 % (ref 0.0–0.2)

## 2019-05-13 MED ORDER — PREDNISONE 20 MG PO TABS
60.0000 mg | ORAL_TABLET | ORAL | Status: AC
  Administered 2019-05-13: 60 mg via ORAL
  Filled 2019-05-13: qty 3

## 2019-05-13 MED ORDER — AZITHROMYCIN 250 MG PO TABS: ORAL_TABLET | ORAL | 0 refills | Status: DC

## 2019-05-13 MED ORDER — AZITHROMYCIN 500 MG PO TABS
500.0000 mg | ORAL_TABLET | ORAL | Status: AC
  Administered 2019-05-13: 500 mg via ORAL
  Filled 2019-05-13: qty 1

## 2019-05-13 MED ORDER — PREDNISONE 10 MG PO TABS: ORAL_TABLET | ORAL | 0 refills | Status: DC

## 2019-05-13 NOTE — ED Notes (Signed)
Peripheral IV discontinued. Catheter intact. No signs of infiltration or redness. Gauze applied to IV site.   Discharge instructions reviewed with patient. Questions fielded by this RN. Patient verbalizes understanding of instructions. Patient discharged home in stable condition per forbach. No acute distress noted at time of discharge.   Sharon Herring was evaluated in Emergency Department on 05/13/2019 for the symptoms described in the history of present illness. She was evaluated in the context of the global COVID-19 pandemic, which necessitated consideration that the patient might be at risk for infection with the SARS-CoV-2 virus that causes COVID-19. Institutional protocols and algorithms that pertain to the evaluation of patients at risk for COVID-19 are in a state of rapid change based on information released by regulatory bodies including the CDC and federal and state organizations. These policies and algorithms were followed during the patient's care in the ED.

## 2019-05-13 NOTE — ED Provider Notes (Signed)
[facility] Emergency Department Provider Note  ____________________________________________   First MD Initiated Contact with Patient 05/12/19 2307     (approximate)  I have reviewed the triage vital signs and the nursing notes.   HISTORY  Chief Complaint Chest Pain and Known Covid +    HPI Sharon Herring is a 43 y.o. female with medical history as listed below who was diagnosed with COVID-19 few days ago after her mom was first diagnosed the prior week.  The patient presents to the ED tonight for persistent body aches, sore throat, some chest discomfort, and some difficulty breathing.  Exertion makes his symptoms worse and nothing makes it better.  She has had some chills and subjective fever.  She describes her symptoms as severe although she is ambulatory without any difficulty and breathing without any difficulty.   She denies nausea, vomiting, and abdominal pain.        Past Medical History:  Diagnosis Date  . Anemia   . Dizziness   . Fibroid uterus   . GERD (gastroesophageal reflux disease)   . IBS (irritable bowel syndrome)   . Migraine   . Polyp of stomach 01/06/2016   Stomach polyp, pyloric, consistent with prolapse.     Patient Active Problem List   Diagnosis Date Noted  . Lymphadenopathy 01/16/2018  . Moderate obstructive sleep apnea 12/19/2017  . Swelling of limb 01/27/2017  . B12 deficiency 03/28/2016  . Vitamin D deficiency 03/28/2016  . Hyperglycemia 03/28/2016  . Multiple gastric polyps 01/08/2016  . Menorrhagia 11/13/2015  . Chronic constipation 09/23/2015  . Obesity, Class I, BMI 30.0-34.9 (see actual BMI) 06/02/2015  . Iron deficiency anemia 03/14/2014  . History of uterine fibroid 03/14/2014    Past Surgical History:  Procedure Laterality Date  . APPENDECTOMY  2012  . ESOPHAGOGASTRODUODENOSCOPY (EGD) WITH PROPOFOL N/A 01/06/2016   Procedure: ESOPHAGOGASTRODUODENOSCOPY (EGD) WITH PROPOFOL;  Surgeon: Robert Bellow, MD;  Location: [facility] ENDOSCOPY;  Service: Endoscopy;  Laterality: N/A;  . ROBOTIC ASSISTED LAPAROSCOPIC CHOLECYSTECTOMY N/A 03/03/2016   Procedure: ROBOTIC ASSISTED LAPAROSCOPIC CHOLECYSTECTOMY ;  Surgeon: Clayburn Pert, MD;  Location: [facility] ORS;  Service: General;  Laterality: N/A;  . UTERINE FIBROID SURGERY  2012    Prior to Admission medications   Medication Sig Start Date End Date Taking? Authorizing Provider  azithromycin (ZITHROMAX) 250 MG tablet Take 2 tablets PO on day 1, then take 1 tablet PO daily for 4 more days 05/13/19   Hinda Kehr, MD  fluticasone [facility]) 50 MCG/ACT nasal spray Place 2 sprays into both nostrils daily. 08/21/17   Sharion Balloon, FNP  furosemide (LASIX) 20 MG tablet Take 1 tablet (20 mg total) by mouth daily. Patient taking differently: Take 20 mg by mouth daily as needed.  07/17/18   Steele Sizer, MD  ibuprofen (ADVIL,MOTRIN) 800 MG tablet Take 1 tablet (800 mg total) by mouth every 8 (eight) hours as needed. 05/10/17   Rubie Maid, MD  iron polysaccharides (NIFEREX) 150 MG capsule Take 150 mg by mouth every other day.    [provider]  Iron-FA-B Cmp-C-Biot-Probiotic (FUSION PLUS) CAPS Take 1 capsule by mouth daily.    [provider]  linaclotide Rolan Lipa) 145 MCG CAPS capsule Take 1 capsule (145 mcg total) by mouth daily before breakfast. 12/16/15   Steele Sizer, MD  Multiple Vitamins-Minerals (MULTIVITAMIN ADULT) TABS Take 1 tablet by mouth daily. 01/04/17   Steele Sizer, MD  norethindrone (AYGESTIN) 5 MG tablet Take 1 tablet (5 mg  total) by mouth daily. 10/18/18   Rubie Maid, MD  predniSONE (DELTASONE) 10 MG tablet Take 4 tabs (40 mg) PO x 2 days, then take 2 tabs (20 mg) PO x 2 days, then take 1 tab (10 mg) PO x 2 days, then take 1/2 tab (5 mg) PO x 4 days. 05/13/19   Hinda Kehr, MD  PREVIDENT 5000 BOOSTER PLUS 1.1 % PSTE  07/13/18   [provider]    Allergies Aspirin, Oxycodone-acetaminophen, Lexapro  [escitalopram], Tape, Belviq [lorcaserin hcl], Contrave [naltrexone-bupropion hcl er], Dicyclomine, and Norethindrone  Family History  Problem Relation Age of Onset  . Hypertension Mother   . Hypertension Father   . Pancreatic cancer Father 29  . Breast cancer Neg Hx   . Colon cancer Neg Hx     Social History Social History   Tobacco Use  . Smoking status: Never Smoker  . Smokeless tobacco: Never Used  Substance Use Topics  . Alcohol use: No    Alcohol/week: 0.0 standard drinks  . Drug use: No    Review of Systems Constitutional: +fever/chills Eyes: No visual changes. ENT: +sore throat. Cardiovascular: +chest pain. Respiratory: +shortness of breath. Gastrointestinal: No abdominal pain.  No nausea, no vomiting.  No diarrhea.  No constipation. Genitourinary: Negative for dysuria. Musculoskeletal: Generalized body aches.  Negative for neck pain.  Negative for back pain. Integumentary: Negative for rash. Neurological: Negative for headaches, focal weakness or numbness.   ____________________________________________   PHYSICAL EXAM:  VITAL SIGNS: ED Triage Vitals  Enc Vitals Group     BP 05/12/19 2240 136/85     Pulse Rate 05/12/19 2240 99     Resp 05/12/19 2240 17     Temp 05/12/19 2240 98.7 F (37.1 C)     Temp Source 05/12/19 2240 Oral     SpO2 05/12/19 2240 96 %     Weight 05/12/19 2237 100.2 kg (221 lb)     Height 05/12/19 2237 1.626 m (5\' 4" )     Head Circumference --      Peak Flow --      Pain Score 05/12/19 2237 7     Pain Loc --      Pain Edu? --      Excl. in [place]? --     Constitutional: Alert and oriented.  Generally well-appearing although she does look uncomfortable. Eyes: Conjunctivae are normal.  Head: Atraumatic. Nose: No congestion/rhinnorhea. Mouth/Throat: Mucous membranes are moist. Neck: No stridor.  No meningeal signs.   Cardiovascular: Mild tachycardia, regular rhythm. Good peripheral circulation. Grossly normal heart sounds.  Respiratory: Normal respiratory effort.  No retractions. Gastrointestinal: Soft and nontender. No distention.  Musculoskeletal: No lower extremity tenderness nor edema. No gross deformities of extremities. Neurologic:  Normal speech and language. No gross focal neurologic deficits are appreciated.  Skin:  Skin is warm, dry and intact. Psychiatric: Mood and affect are normal. Speech and behavior are normal.  ____________________________________________   LABS (all labs ordered are listed, but only abnormal results are displayed)  Labs Reviewed  CBC WITH DIFFERENTIAL/PLATELET - Abnormal; Notable for the following components:      Result Value   Hemoglobin 11.0 (*)    HCT 35.8 (*)    MCV 75.7 (*)    MCH 23.3 (*)    RDW 22.3 (*)    Platelets 432 (*)    All other components within normal limits  LIPASE, BLOOD  COMPREHENSIVE METABOLIC PANEL  TROPONIN I (HIGH SENSITIVITY)  TROPONIN I (HIGH SENSITIVITY)  ____________________________________________  EKG  ED ECG REPORT I, Hinda Kehr, the attending physician, personally viewed and interpreted this ECG.  Date: 05/12/2019 EKG Time: 22: 43 Rate: 98 Rhythm: normal sinus rhythm QRS Axis: normal Intervals: normal ST/T Wave abnormalities: Inverted T waves in lead III, otherwise unremarkable Narrative Interpretation: no evidence of acute ischemia  ____________________________________________  RADIOLOGY I, Hinda Kehr, personally viewed and evaluated these images (plain radiographs) as part of my medical decision making, as well as reviewing the written report by the radiologist.  ED MD interpretation: Atypical viral pattern suspicious for viral pneumonia in the setting of known COVID-19 infection  Official radiology report(s): Dg Chest Portable 1 View  Result Date: 05/12/2019 CLINICAL DATA:  Chest pain EXAM: PORTABLE CHEST 1 VIEW COMPARISON:  11/01/2018 FINDINGS: Interstitial and vague ground-glass opacities in the mid to  lower lung zones. Normal heart size. No pneumothorax. IMPRESSION: Interstitial and vague airspace opacity in the mid to lower lung zone, suspect for atypical/viral pneumonia Electronically Signed   By: Donavan Foil M.D.   On: 05/12/2019 23:26    ____________________________________________   PROCEDURES   Procedure(s) performed (including Critical Care):  Procedures   ____________________________________________   INITIAL IMPRESSION / MDM / [place] / ED COURSE  As part of my medical decision making, I reviewed the following data within the [facility] notes reviewed and incorporated, Labs reviewed , EKG interpreted , Old chart reviewed, Radiograph reviewed  and Notes from prior ED visits   Differential diagnosis includes, but is not limited to, COVID-19 infection, lower respiratory infection (pneumonia) secondary to COVID-19, bacterial pneumonia, atypical pneumonia, much less likely ACS or PE.  The patient is ambulatory without any difficulty and has reassuring vital signs with no hypoxemia and only mild tachycardia, normal blood pressure.  Chest x-ray suggests developing viral pneumonia which is not unexpected with known COVID-19.  Lab work is all reassuring and there is no sign of ischemia on chest x-ray.  I had my usual customary COVID-19 discussion with the patient as well as recommended home therapy is and return precautions, that preferably she should only return to the ED or primary care doctor's office with substantially worsening symptoms.  Given the presence of abnormalities on the chest x-ray I am prescribing azithromycin as well as prednisone.  I provided my usual customary return precautions and she understands and agrees with the plan.          ____________________________________________  FINAL CLINICAL IMPRESSION(S) / ED DIAGNOSES  Final diagnoses:  COVID-19 virus infection  Viral pneumonia  Lower respiratory tract infection due  to COVID-19 virus     MEDICATIONS GIVEN DURING THIS VISIT:  Medications  predniSONE (DELTASONE) tablet 60 mg (has no administration in time range)  azithromycin (ZITHROMAX) tablet 500 mg (has no administration in time range)     ED Discharge Orders         Ordered    azithromycin (ZITHROMAX) 250 MG tablet     05/13/19 0014    predniSONE (DELTASONE) 10 MG tablet     05/13/19 0014          *Please note:  Ashlyne L Green was evaluated in Emergency Department on 05/13/2019 for the symptoms described in the history of present illness. She was evaluated in the context of the global COVID-19 pandemic, which necessitated consideration that the patient might be at risk for infection with the SARS-CoV-2 virus that causes COVID-19. Institutional protocols and algorithms that pertain to the evaluation of patients at  risk for COVID-19 are in a state of rapid change based on information released by regulatory bodies including the CDC and federal and state organizations. These policies and algorithms were followed during the patient's care in the ED.  Some ED evaluations and interventions may be delayed as a result of limited staffing during the pandemic.*  Note:  This document was prepared using Dragon voice recognition software and may include unintentional dictation errors.   Hinda Kehr, MD 05/13/19 [phone]

## 2019-05-13 NOTE — ED Notes (Signed)
MD Forbach at bedside. 

## 2019-05-13 NOTE — Discharge Instructions (Addendum)
As we discussed, we believe your symptoms are caused by COVID-19.  At this time you do not require hospitalization and should stay at home as much as possible, drink plenty of fluids to stay hydrated and eat normally.  Use over-the-counter acetaminophen according to label instructions for pain and discomfort.  If you were prescribed any medications tonight, please take them according to the label instructions for the full course of treatment.  We recommend that you self-quarantine at home for 14 days, or until 3 consecutive days without fever (without taking medication to make your temperature come down, such as Tylenol (acetaminophen), after your respiratory symptoms have improved, and after at least 7 days have passed since your symptoms first appeared.  You should have as minimal contact as possible with anyone else including close family as per the [facility] paperwork guidelines listed below. Follow-up with your doctor by phone or online as needed and return immediately to the emergency department or call 911 only if you develop new or worsening symptoms that concern you.  You can expect to feel ill for an extended period of time.  You can find up-to-date information about COVID-19 in [place] by calling the [facility]: [phone]. You may also call 2-1-1, or [phone], or additional resources.  You can also find information online at [email or web address]://miller-white.com/, or on the Center for Disease Control (CDC) website at BeginnerSteps.be.

## 2019-05-14 ENCOUNTER — Ambulatory Visit: Payer: No Typology Code available for payment source | Admitting: Family Medicine

## 2019-05-14 ENCOUNTER — Emergency Department
Admission: EM | Admit: 2019-05-14 | Discharge: 2019-05-14 | Discharge status: Against Medical Advice | Payer: No Typology Code available for payment source | Attending: Emergency Medicine | Admitting: Emergency Medicine

## 2019-05-14 ENCOUNTER — Other Ambulatory Visit: Payer: Self-pay

## 2019-05-14 DIAGNOSIS — Z881 Allergy status to other antibiotic agents status: Secondary | ICD-10-CM | POA: Diagnosis not present

## 2019-05-14 DIAGNOSIS — Z79899 Other long term (current) drug therapy: Secondary | ICD-10-CM | POA: Diagnosis not present

## 2019-05-14 DIAGNOSIS — Z886 Allergy status to analgesic agent status: Secondary | ICD-10-CM | POA: Diagnosis not present

## 2019-05-14 DIAGNOSIS — R112 Nausea with vomiting, unspecified: Secondary | ICD-10-CM | POA: Diagnosis present

## 2019-05-14 DIAGNOSIS — Z888 Allergy status to other drugs, medicaments and biological substances status: Secondary | ICD-10-CM | POA: Diagnosis not present

## 2019-05-14 DIAGNOSIS — Z91048 Other nonmedicinal substance allergy status: Secondary | ICD-10-CM | POA: Insufficient documentation

## 2019-05-14 DIAGNOSIS — Z885 Allergy status to narcotic agent status: Secondary | ICD-10-CM | POA: Insufficient documentation

## 2019-05-14 DIAGNOSIS — U071 COVID-19: Secondary | ICD-10-CM | POA: Insufficient documentation

## 2019-05-14 LAB — URINALYSIS, COMPLETE (UACMP) WITH MICROSCOPIC
Bacteria, UA: NONE SEEN
Bilirubin Urine: NEGATIVE
Glucose, UA: NEGATIVE mg/dL
Ketones, ur: NEGATIVE mg/dL
Nitrite: NEGATIVE
Protein, ur: 100 mg/dL — AB
RBC / HPF: >50 RBC/hpf — ABNORMAL HIGH (ref 0–5)
Specific Gravity, Urine: 1.028 (ref 1.005–1.030)
pH: 6 (ref 5.0–8.0)

## 2019-05-14 LAB — COMPREHENSIVE METABOLIC PANEL
ALT: 15 U/L (ref 0–44)
AST: 20 U/L (ref 15–41)
Albumin: 3.7 g/dL (ref 3.5–5.0)
Alkaline Phosphatase: 57 U/L (ref 38–126)
Anion gap: 11 (ref 5–15)
BUN: 13 mg/dL (ref 6–20)
CO2: 21 mmol/L — ABNORMAL LOW (ref 22–32)
Calcium: 9.2 mg/dL (ref 8.9–10.3)
Chloride: 105 mmol/L (ref 98–111)
Creatinine, Ser: 0.8 mg/dL (ref 0.44–1.00)
GFR calc Af Amer: >60 mL/min (ref >60)
GFR calc non Af Amer: >60 mL/min (ref >60)
Glucose, Bld: 164 mg/dL — ABNORMAL HIGH (ref 70–99)
Potassium: 4.1 mmol/L (ref 3.5–5.1)
Sodium: 137 mmol/L (ref 135–145)
Total Bilirubin: 0.5 mg/dL (ref 0.3–1.2)
Total Protein: 7.9 g/dL (ref 6.5–8.1)

## 2019-05-14 LAB — CBC
HCT: 35.4 % — ABNORMAL LOW (ref 36.0–46.0)
Hemoglobin: 10.9 g/dL — ABNORMAL LOW (ref 12.0–15.0)
MCH: 23 pg — ABNORMAL LOW (ref 26.0–34.0)
MCHC: 30.8 g/dL (ref 30.0–36.0)
MCV: 74.8 fL — ABNORMAL LOW (ref 80.0–100.0)
Platelets: 520 10*3/uL — ABNORMAL HIGH (ref 150–400)
RBC: 4.73 MIL/uL (ref 3.87–5.11)
RDW: 21.8 % — ABNORMAL HIGH (ref 11.5–15.5)
WBC: 4.7 10*3/uL (ref 4.0–10.5)
nRBC: 0 % (ref 0.0–0.2)

## 2019-05-14 LAB — LIPASE, BLOOD: Lipase: 22 U/L (ref 11–51)

## 2019-05-14 LAB — POCT PREGNANCY, URINE: Preg Test, Ur: NEGATIVE

## 2019-05-14 MED ORDER — LOPERAMIDE HCL 2 MG PO CAPS
4.0000 mg | ORAL_CAPSULE | Freq: Once | ORAL | Status: AC
  Administered 2019-05-14: 05:00:00 4 mg via ORAL
  Filled 2019-05-14: qty 2

## 2019-05-14 MED ORDER — ONDANSETRON 4 MG PO TBDP
4.0000 mg | ORAL_TABLET | Freq: Once | ORAL | Status: AC
  Administered 2019-05-14: 4 mg via ORAL
  Filled 2019-05-14: qty 1

## 2019-05-14 MED ORDER — ONDANSETRON 4 MG PO TBDP: 4.0000 mg | ORAL_TABLET | Freq: Three times a day (TID) | ORAL | 0 refills | Status: DC | PRN

## 2019-05-14 NOTE — ED Notes (Signed)
Pt turns around and st she decided to stay now

## 2019-05-14 NOTE — ED Notes (Signed)
Pt reports leaving now due to wait; instr to return for any new or worsening symptoms

## 2019-05-14 NOTE — ED Provider Notes (Signed)
[facility] Emergency Department Provider Note _   First MD Initiated Contact with Patient 05/14/19 [phone]     (approximate)  I have reviewed the triage vital signs and the nursing notes.   HISTORY  Chief Complaint Emesis   HPI Sharon Herring is a 43 y.o. female with below listed previous medical conditions including recently diagnosed COVID-19 yesterday presents to the emergency department secondary to nausea vomiting and diarrhea.  Patient states that she believed this to be secondary to the antibiotic that she was prescribed yesterday.  Patient denies any abdominal pain.  Patient admits to decreased ability to tolerate p.o. as a result of vomiting.        Past Medical History:  Diagnosis Date  . Anemia   . Dizziness   . Fibroid uterus   . GERD (gastroesophageal reflux disease)   . IBS (irritable bowel syndrome)   . Migraine   . Polyp of stomach 01/06/2016   Stomach polyp, pyloric, consistent with prolapse.     Patient Active Problem List   Diagnosis Date Noted  . Lymphadenopathy 01/16/2018  . Moderate obstructive sleep apnea 12/19/2017  . Swelling of limb 01/27/2017  . B12 deficiency 03/28/2016  . Vitamin D deficiency 03/28/2016  . Hyperglycemia 03/28/2016  . Multiple gastric polyps 01/08/2016  . Menorrhagia 11/13/2015  . Chronic constipation 09/23/2015  . Obesity, Class I, BMI 30.0-34.9 (see actual BMI) 06/02/2015  . Iron deficiency anemia 03/14/2014  . History of uterine fibroid 03/14/2014    Past Surgical History:  Procedure Laterality Date  . APPENDECTOMY  2012  . ESOPHAGOGASTRODUODENOSCOPY (EGD) WITH PROPOFOL N/A 01/06/2016   Procedure: ESOPHAGOGASTRODUODENOSCOPY (EGD) WITH PROPOFOL;  Surgeon: Robert Bellow, MD;  Location: [facility] ENDOSCOPY;  Service: Endoscopy;  Laterality: N/A;  . ROBOTIC ASSISTED LAPAROSCOPIC CHOLECYSTECTOMY N/A 03/03/2016   Procedure: ROBOTIC ASSISTED LAPAROSCOPIC CHOLECYSTECTOMY ;  Surgeon: Clayburn Pert,  MD;  Location: [facility] ORS;  Service: General;  Laterality: N/A;  . UTERINE FIBROID SURGERY  2012    Prior to Admission medications   Medication Sig Start Date End Date Taking? Authorizing Provider  azithromycin (ZITHROMAX) 250 MG tablet Take 2 tablets PO on day 1, then take 1 tablet PO daily for 4 more days 05/13/19   Hinda Kehr, MD  fluticasone [facility]) 50 MCG/ACT nasal spray Place 2 sprays into both nostrils daily. 08/21/17   Sharion Balloon, FNP  furosemide (LASIX) 20 MG tablet Take 1 tablet (20 mg total) by mouth daily. Patient taking differently: Take 20 mg by mouth daily as needed.  07/17/18   Steele Sizer, MD  ibuprofen (ADVIL,MOTRIN) 800 MG tablet Take 1 tablet (800 mg total) by mouth every 8 (eight) hours as needed. 05/10/17   Rubie Maid, MD  iron polysaccharides (NIFEREX) 150 MG capsule Take 150 mg by mouth every other day.    [provider]  Iron-FA-B Cmp-C-Biot-Probiotic (FUSION PLUS) CAPS Take 1 capsule by mouth daily.    [provider]  linaclotide Rolan Lipa) 145 MCG CAPS capsule Take 1 capsule (145 mcg total) by mouth daily before breakfast. 12/16/15   Steele Sizer, MD  Multiple Vitamins-Minerals (MULTIVITAMIN ADULT) TABS Take 1 tablet by mouth daily. 01/04/17   Steele Sizer, MD  norethindrone (AYGESTIN) 5 MG tablet Take 1 tablet (5 mg total) by mouth daily. 10/18/18   Rubie Maid, MD  ondansetron (ZOFRAN ODT) 4 MG disintegrating tablet Take 1 tablet (4 mg total) by mouth every 8 (eight) hours as needed. 05/14/19   Gregor Hams, MD  predniSONE (DELTASONE) 10 MG tablet Take 4 tabs (40 mg) PO x 2 days, then take 2 tabs (20 mg) PO x 2 days, then take 1 tab (10 mg) PO x 2 days, then take 1/2 tab (5 mg) PO x 4 days. 05/13/19   Hinda Kehr, MD  PREVIDENT 5000 BOOSTER PLUS 1.1 % PSTE  07/13/18   [provider]    Allergies Aspirin, Oxycodone-acetaminophen, Lexapro [escitalopram], Tape, Belviq [lorcaserin hcl], Contrave [naltrexone-bupropion  hcl er], Dicyclomine, and Norethindrone  Family History  Problem Relation Age of Onset  . Hypertension Mother   . Hypertension Father   . Pancreatic cancer Father 51  . Breast cancer Neg Hx   . Colon cancer Neg Hx     Social History Social History   Tobacco Use  . Smoking status: Never Smoker  . Smokeless tobacco: Never Used  Substance Use Topics  . Alcohol use: No    Alcohol/week: 0.0 standard drinks  . Drug use: No    Review of Systems Constitutional: No fever/chills Eyes: No visual changes. ENT: No sore throat. Cardiovascular: Denies chest pain. Respiratory: Denies shortness of breath. Gastrointestinal: No abdominal pain.  Positive for nausea vomiting and diarrhea Genitourinary: Negative for dysuria. Musculoskeletal: Negative for neck pain.  Negative for back pain. Integumentary: Negative for rash. Neurological: Negative for headaches, focal weakness or numbness.   ____________________________________________   PHYSICAL EXAM:  VITAL SIGNS: ED Triage Vitals [05/14/19 0052]  Enc Vitals Group     BP 128/83     Pulse Rate 91     Resp 20     Temp 98.3 F (36.8 C)     Temp Source Oral     SpO2 99 %     Weight 100.2 kg (221 lb)     Height 1.626 m (5\' 4" )     Head Circumference      Peak Flow      Pain Score 7     Pain Loc      Pain Edu?      Excl. in [place]?     Constitutional: Alert and oriented.  Eyes: Conjunctivae are normal.  Mouth/Throat: Mucous membranes are moist. Neck: No stridor.  No meningeal signs.   Cardiovascular: Normal rate, regular rhythm. Good peripheral circulation. Grossly normal heart sounds. Respiratory: Normal respiratory effort.  No retractions. Gastrointestinal: Soft and nontender. No distention.  Musculoskeletal: No lower extremity tenderness nor edema. No gross deformities of extremities. Neurologic:  Normal speech and language. No gross focal neurologic deficits are appreciated.  Skin:  Skin is warm, dry and intact.  Psychiatric: Mood and affect are normal. Speech and behavior are normal.  ____________________________________________   LABS (all labs ordered are listed, but only abnormal results are displayed)  Labs Reviewed  CBC - Abnormal; Notable for the following components:      Result Value   Hemoglobin 10.9 (*)    HCT 35.4 (*)    MCV 74.8 (*)    MCH 23.0 (*)    RDW 21.8 (*)    Platelets 520 (*)    All other components within normal limits  COMPREHENSIVE METABOLIC PANEL - Abnormal; Notable for the following components:   CO2 21 (*)    Glucose, Bld 164 (*)    All other components within normal limits  LIPASE, BLOOD  URINALYSIS, COMPLETE (UACMP) WITH MICROSCOPIC  POC URINE PREG, ED  POCT PREGNANCY, URINE     Procedures   ____________________________________________   INITIAL IMPRESSION / MDM / ASSESSMENT AND PLAN /  ED COURSE  As part of my medical decision making, I reviewed the following data within the electronic MEDICAL RECORD NUMBER   42 year old female presented with above-stated history and physical exam secondary to nausea vomiting and diarrhea after being diagnosed with COVID-19 yesterday.  Patient thought it was secondary to the antibiotic (azithromycin) that she was prescribed however suspect this to be highly unlikely.  I suspect the patient symptoms to be most likely consistent with her known diagnosis of COVID-19.  Patient was given Zofran ODT in the emergency department as well as Imodium and will be prescribed Zofran ODT and advised to take Imodium at home as needed.  Stressed the importance of the patient of keeping herself hydrated.  I also spoke with the patient at length regarding warning signs that would warrant immediate return to the emergency department including and emphasizing respiratory difficulty.     ____________________________________________  FINAL CLINICAL IMPRESSION(S) / ED DIAGNOSES  Final diagnoses:  [id number] virus infection     MEDICATIONS  GIVEN DURING THIS VISIT:  Medications  ondansetron (ZOFRAN-ODT) disintegrating tablet 4 mg (4 mg Oral Given 05/14/19 0432)  loperamide (IMODIUM) capsule 4 mg (4 mg Oral Given 05/14/19 0432)     ED Discharge Orders         Ordered    ondansetron (ZOFRAN ODT) 4 MG disintegrating tablet  Every 8 hours PRN     05/14/19 0435          *Please note:  Shannell L Green was evaluated in Emergency Department on 05/14/2019 for the symptoms described in the history of present illness. She was evaluated in the context of the global COVID-19 pandemic, which necessitated consideration that the patient might be at risk for infection with the SARS-CoV-2 virus that causes COVID-19. Institutional protocols and algorithms that pertain to the evaluation of patients at risk for COVID-19 are in a state of rapid change based on information released by regulatory bodies including the CDC and federal and state organizations. These policies and algorithms were followed during the patient's care in the ED.  Some ED evaluations and interventions may be delayed as a result of limited staffing during the pandemic.*  Note:  This document was prepared using Dragon voice recognition software and may include unintentional dictation errors.   Gregor Hams, MD 05/14/19 [phone]

## 2019-05-14 NOTE — ED Triage Notes (Signed)
Pt states she is covid positive, has been here yesterday for the same. Started on antibiotics and steroids, and has been vomiting since. Pt has also had some diarrhea, here for persistent vomiting.

## 2019-05-15 ENCOUNTER — Telehealth: Payer: Self-pay | Admitting: Family Medicine

## 2019-05-15 NOTE — Telephone Encounter (Signed)
Spoke with patient and informed her that Dr. Ancil Boozer doesn't have admitting privileges.  Patient verbalized understanding.  I did ask patient how she was doing since our conversation earlier and patient stated that she feels a little better but if she starts feeling worse again she will go to the ED.

## 2019-05-15 NOTE — Telephone Encounter (Signed)
Patient called and stated she is still feeling bad.  Per patient she is experiencing shortness of breath, body aches, chest pain, fatigue, and vomiting.  Patient went to ER on 05/14/2019 and was given Zofran for nausea but it doesn't seem to help.  Patient wants to know if Dr. Ancil Boozer can have her admitted to hospital.  Patient is already scheduled to an ER follow up visit with Dr. Ancil Boozer on 05/16/2019.  Please advise.

## 2019-05-16 ENCOUNTER — Encounter: Payer: Self-pay | Admitting: Family Medicine

## 2019-05-16 ENCOUNTER — Ambulatory Visit (INDEPENDENT_AMBULATORY_CARE_PROVIDER_SITE_OTHER): Payer: No Typology Code available for payment source | Admitting: Family Medicine

## 2019-05-16 DIAGNOSIS — U071 COVID-19: Secondary | ICD-10-CM

## 2019-05-16 DIAGNOSIS — R1110 Vomiting, unspecified: Secondary | ICD-10-CM | POA: Diagnosis not present

## 2019-05-16 DIAGNOSIS — R05 Cough: Secondary | ICD-10-CM

## 2019-05-16 DIAGNOSIS — R0602 Shortness of breath: Secondary | ICD-10-CM

## 2019-05-16 DIAGNOSIS — R110 Nausea: Secondary | ICD-10-CM | POA: Diagnosis not present

## 2019-05-16 DIAGNOSIS — R059 Cough, unspecified: Secondary | ICD-10-CM

## 2019-05-16 MED ORDER — ALBUTEROL SULFATE HFA 108 (90 BASE) MCG/ACT IN AERS: 2.0000 | INHALATION_SPRAY | Freq: Four times a day (QID) | RESPIRATORY_TRACT | 0 refills | Status: DC | PRN

## 2019-05-16 NOTE — Progress Notes (Signed)
Name: Sharon Herring   MRN: [id number]    DOB: 12-20-1975   Date:05/16/2019       Progress Note  Subjective  Chief Complaint  Chief Complaint  Patient presents with   Emesis    I connected with  [name]  on 05/16/19 at 11:20 AM EDT by a video enabled telemedicine application and verified that I am speaking with the correct person using two identifiers.  I discussed the limitations of evaluation and management by telemedicine and the availability of in person appointments. The patient expressed understanding and agreed to proceed. Staff also discussed with the patient that there may be a patient responsible charge related to this service. Patient Location: at home  Provider Location: [facility]   HPI  COVID-19: Takyra developed symptoms of COVID-19 on 05/06/2019, she was having high fever, productive cough and severe fatigue. She was given prednisone and Z-pack to cover for pneumonia. Her symptoms got worse and she went to [facility] on 05/12/2019 because she had developed difficulty breathing, she had an EKG that showed normal sinus rhythm, some anemia ( stable for her ) and thrombocytosis. CXR report read as possible atypical/viral pneumonia , she was sent home with another round of prednisone and zpack. She went back to [facility] on 09/22 because of new onset nausea, vomiting and diarrhea. She was feeling very weak. Labs reviewed today, she was sent home with rx of zofran. She states diarrhea resolved, she still has some nausea but better with medication, able to eat dinner last night but vomited her breakfast, still feels tired, but states getting better now.     Patient Active Problem List   Diagnosis Date Noted   Lymphadenopathy 01/16/2018   Moderate obstructive sleep apnea 12/19/2017   Swelling of limb 01/27/2017   B12 deficiency 03/28/2016   Vitamin D deficiency 03/28/2016   Hyperglycemia 03/28/2016   Multiple gastric polyps 01/08/2016   Menorrhagia  11/13/2015   Chronic constipation 09/23/2015   Obesity, Class I, BMI 30.0-34.9 (see actual BMI) 06/02/2015   Iron deficiency anemia 03/14/2014   History of uterine fibroid 03/14/2014    Past Surgical History:  Procedure Laterality Date   APPENDECTOMY  2012   ESOPHAGOGASTRODUODENOSCOPY (EGD) WITH PROPOFOL N/A 01/06/2016   Procedure: ESOPHAGOGASTRODUODENOSCOPY (EGD) WITH PROPOFOL;  Surgeon: Robert Bellow, MD;  Location: [facility] ENDOSCOPY;  Service: Endoscopy;  Laterality: N/A;   ROBOTIC ASSISTED LAPAROSCOPIC CHOLECYSTECTOMY N/A 03/03/2016   Procedure: ROBOTIC ASSISTED LAPAROSCOPIC CHOLECYSTECTOMY ;  Surgeon: Clayburn Pert, MD;  Location: [facility] ORS;  Service: General;  Laterality: N/A;   UTERINE FIBROID SURGERY  2012    Family History  Problem Relation Age of Onset   Hypertension Mother    Hypertension Father    Pancreatic cancer Father 15   Breast cancer Neg Hx    Colon cancer Neg Hx     Social History   Socioeconomic History   Marital status: Single    Spouse name: Not on file   Number of children: 0   Years of education: Not on file   Highest education level: Bachelor's degree (e.g., BA, AB, BS)  Occupational History   Occupation: CMA  Social Designer, fashion/clothing strain: Not hard at all   Food insecurity    Worry: Never true    Inability: Never true   Transportation needs    Medical: No    Non-medical: No  Tobacco Use   Smoking status: Never Smoker   Smokeless tobacco: Never Used  Substance and Sexual Activity   Alcohol use: No    Alcohol/week: 0.0 standard drinks   Drug use: No   Sexual activity: Not Currently    Partners: Male    Birth control/protection: Condom  Lifestyle   Physical activity    Days per week: 7 days    Minutes per session: 30 min   Stress: Not at all  Relationships   Social connections    Talks on phone: More than three times a week    Gets together: More than three times a week    Attends religious  service: More than 4 times per year    Active member of club or organization: Yes    Attends meetings of clubs or organizations: More than 4 times per year    Relationship status: Never married   Intimate partner violence    Fear of current or ex partner: No    Emotionally abused: No    Physically abused: No    Forced sexual activity: No  Other Topics Concern   Not on file  Social History Narrative   Lives alone, works for [employer] at the [employer] , but switching to [employer]      Current Outpatient Medications:    azithromycin (ZITHROMAX) 250 MG tablet, Take 2 tablets PO on day 1, then take 1 tablet PO daily for 4 more days, Disp: 6 each, Rfl: 0   fluticasone (FLONASE) 50 MCG/ACT nasal spray, Place 2 sprays into both nostrils daily., Disp: 16 g, Rfl: 6   furosemide (LASIX) 20 MG tablet, Take 1 tablet (20 mg total) by mouth daily. (Patient taking differently: Take 20 mg by mouth daily as needed. ), Disp: 30 tablet, Rfl: 1   ibuprofen (ADVIL,MOTRIN) 800 MG tablet, Take 1 tablet (800 mg total) by mouth every 8 (eight) hours as needed., Disp: 60 tablet, Rfl: 3   iron polysaccharides (NIFEREX) 150 MG capsule, Take 150 mg by mouth every other day., Disp: , Rfl:    Iron-FA-B Cmp-C-Biot-Probiotic (FUSION PLUS) CAPS, Take 1 capsule by mouth daily., Disp: , Rfl:    linaclotide (LINZESS) 145 MCG CAPS capsule, Take 1 capsule (145 mcg total) by mouth daily before breakfast., Disp: 30 capsule, Rfl: 2   Multiple Vitamins-Minerals (MULTIVITAMIN ADULT) TABS, Take 1 tablet by mouth daily., Disp: 30 tablet, Rfl: 0   norethindrone (AYGESTIN) 5 MG tablet, Take 1 tablet (5 mg total) by mouth daily., Disp: 30 tablet, Rfl: 2   ondansetron (ZOFRAN ODT) 4 MG disintegrating tablet, Take 1 tablet (4 mg total) by mouth every 8 (eight) hours as needed., Disp: 20 tablet, Rfl: 0   predniSONE (DELTASONE) 10 MG tablet, Take 4 tabs (40 mg) PO x 2 days, then take 2 tabs (20 mg) PO x 2 days, then take 1 tab (10 mg) PO  x 2 days, then take 1/2 tab (5 mg) PO x 4 days., Disp: 16 tablet, Rfl: 0   PREVIDENT 5000 BOOSTER PLUS 1.1 % PSTE, , Disp: , Rfl: 2   albuterol (VENTOLIN HFA) 108 (90 Base) MCG/ACT inhaler, Inhale 2 puffs into the lungs every 6 (six) hours as needed for wheezing or shortness of breath., Disp: 18 g, Rfl: 0  Allergies  Allergen Reactions   Aspirin Nausea And Vomiting   Oxycodone-Acetaminophen Rash and Shortness Of Breath   Lexapro [Escitalopram] Palpitations   Tape Other (See Comments)    Burning feeling. Has left mark on skin.   Belviq [Lorcaserin Hcl] Other (See Comments)    headache  Contrave [Naltrexone-Bupropion Hcl Er]    Dicyclomine Nausea And Vomiting   Norethindrone Rash    I personally reviewed active problem list, medication list, allergies, family history, social history with the patient/caregiver today.   ROS  Ten systems reviewed and is negative except as mentioned in HPI   Objective  Virtual encounter, vitals not obtained.  There is no height or weight on file to calculate BMI.  Physical Exam  Awake, alert and oriented, coughing during the visit.   Results for orders placed or performed during the hospital encounter of 05/14/19 (from the past 72 hour(s))  CBC     Status: Abnormal   Collection Time: 05/14/19 12:55 AM  Result Value Ref Range   WBC 4.7 4.0 - 10.5 K/uL   RBC 4.73 3.87 - 5.11 MIL/uL   Hemoglobin 10.9 (L) 12.0 - 15.0 g/dL   HCT 35.4 (L) 36.0 - 46.0 %   MCV 74.8 (L) 80.0 - 100.0 fL   MCH 23.0 (L) 26.0 - 34.0 pg   MCHC 30.8 30.0 - 36.0 g/dL   RDW 21.8 (H) 11.5 - 15.5 %   Platelets 520 (H) 150 - 400 K/uL   nRBC 0.0 0.0 - 0.2 %    Comment: Performed at [facility], [address] [place], [place] [address]  Comprehensive metabolic panel     Status: Abnormal   Collection Time: 05/14/19 12:55 AM  Result Value Ref Range   Sodium 137 135 - 145 mmol/L   Potassium 4.1 3.5 - 5.1 mmol/L   Chloride 105 98 - 111 mmol/L   CO2 21  (L) 22 - 32 mmol/L   Glucose, Bld 164 (H) 70 - 99 mg/dL   BUN 13 6 - 20 mg/dL   Creatinine, Ser 0.80 0.44 - 1.00 mg/dL   Calcium 9.2 8.9 - 10.3 mg/dL   Total Protein 7.9 6.5 - 8.1 g/dL   Albumin 3.7 3.5 - 5.0 g/dL   AST 20 15 - 41 U/L   ALT 15 0 - 44 U/L   Alkaline Phosphatase 57 38 - 126 U/L   Total Bilirubin 0.5 0.3 - 1.2 mg/dL   GFR calc non Af Amer >60 >60 mL/min   GFR calc Af Amer >60 >60 mL/min   Anion gap 11 5 - 15    Comment: Performed at [facility], [address] [place], [place] [address]  Lipase, blood     Status: None   Collection Time: 05/14/19 12:55 AM  Result Value Ref Range   Lipase 22 11 - 51 U/L    Comment: Performed at [facility], [address] [place],  [address]  Urinalysis, Complete w Microscopic     Status: Abnormal   Collection Time: 05/14/19  4:26 AM  Result Value Ref Range   Color, Urine RED (A) YELLOW    Comment: BIOCHEMICALS MAY BE AFFECTED BY COLOR   APPearance CLOUDY (A) CLEAR   Specific Gravity, Urine 1.028 1.005 - 1.030   pH 6.0 5.0 - 8.0   Glucose, UA NEGATIVE NEGATIVE mg/dL   Hgb urine dipstick LARGE (A) NEGATIVE   Bilirubin Urine NEGATIVE NEGATIVE   Ketones, ur NEGATIVE NEGATIVE mg/dL   Protein, ur 100 (A) NEGATIVE mg/dL   Nitrite NEGATIVE NEGATIVE   Leukocytes,Ua SMALL (A) NEGATIVE   RBC / HPF >50 (H) 0 - 5 RBC/hpf   WBC, UA 21-50 0 - 5 WBC/hpf   Bacteria, UA NONE SEEN NONE SEEN   Squamous Epithelial / LPF 0-5 0 -  5   Mucus PRESENT     Comment: Performed at [facility], [address] [place], [place] [address]  Pregnancy, urine POC     Status: None   Collection Time: 05/14/19  4:29 AM  Result Value Ref Range   Preg Test, Ur NEGATIVE NEGATIVE    Comment:        THE SENSITIVITY OF THIS METHODOLOGY IS >24 mIU/mL     PHQ2/9: Depression screen [facility] 2/9 05/16/2019 05/08/2019 01/22/2019 01/22/2019 10/17/2018  Decreased Interest 0 0 0 0 0  Down, Depressed, Hopeless 0 0 0 0 0  PHQ - 2 Score 0 0 0  0 0  Altered sleeping 0 0 0 0 0  Tired, decreased energy 0 0 2 0 2  Change in appetite 0 0 0 0 0  Feeling bad or failure about yourself  0 0 0 0 0  Trouble concentrating 0 0 0 0 0  Moving slowly or fidgety/restless 0 0 0 0 0  Suicidal thoughts 0 0 0 0 0  PHQ-9 Score 0 0 2 0 2  Difficult doing work/chores - Not difficult at all Not difficult at all - Not difficult at all  Some recent data might be hidden   PHQ-2/9 Result is negative.    Fall Risk: Fall Risk  05/16/2019 05/08/2019 01/22/2019 10/17/2018 08/07/2018  Falls in the past year? 0 0 0 0 0  Number falls in past yr: 0 0 0 - 0  Injury with Fall? 0 0 0 - 0     Assessment & Plan  1. COVID-19 virus infection  Finishing second round of medrol dose pack and zpack She has been out of work since 09/14, we will fill out FMLA with tentative return to work on 05/28/2019  2. Vomiting in adult  Last time this am after breakfast  3. Nausea  She still has zofran at home   4. Cough  - albuterol (VENTOLIN HFA) 108 (90 Base) MCG/ACT inhaler; Inhale 2 puffs into the lungs every 6 (six) hours as needed for wheezing or shortness of breath.  Dispense: 18 g; Refill: 0  5. SOB (shortness of breath)  - albuterol (VENTOLIN HFA) 108 (90 Base) MCG/ACT inhaler; Inhale 2 puffs into the lungs every 6 (six) hours as needed for wheezing or shortness of breath.  Dispense: 18 g; Refill: 0  I discussed the assessment and treatment plan with the patient. The patient was provided an opportunity to ask questions and all were answered. The patient agreed with the plan and demonstrated an understanding of the instructions.  The patient was advised to call back or seek an in-person evaluation if the symptoms worsen or if the condition fails to improve as anticipated.  I provided 15  minutes of non-face-to-face time during this encounter.

## 2019-05-21 ENCOUNTER — Encounter: Payer: Self-pay | Admitting: Family Medicine

## 2019-05-22 ENCOUNTER — Inpatient Hospital Stay: Payer: No Typology Code available for payment source

## 2019-05-27 ENCOUNTER — Ambulatory Visit
Admission: RE | Admit: 2019-05-27 | Discharge: 2019-05-27 | Disposition: A | Discharge status: Home / Self Care | Payer: No Typology Code available for payment source | Source: Ambulatory Visit | Attending: Family Medicine | Admitting: Family Medicine

## 2019-05-27 ENCOUNTER — Other Ambulatory Visit: Payer: Self-pay | Admitting: Family Medicine

## 2019-05-27 ENCOUNTER — Telehealth: Payer: Self-pay | Admitting: Obstetrics and Gynecology

## 2019-05-27 ENCOUNTER — Other Ambulatory Visit: Payer: Self-pay

## 2019-05-27 ENCOUNTER — Telehealth: Payer: Self-pay | Admitting: Family Medicine

## 2019-05-27 DIAGNOSIS — U071 COVID-19: Secondary | ICD-10-CM

## 2019-05-27 NOTE — Progress Notes (Signed)
c 

## 2019-05-27 NOTE — Telephone Encounter (Signed)
Surgery was scheduled by [facility]. I called there office to make aware.

## 2019-05-27 NOTE — Telephone Encounter (Signed)
The pt called and stated that she has tested positive for covid-19. The patient is requesting to have her procedure canceled for now and to reschedule at a later date once she is feeling better and negative for the virus. Pt requesting a call back. Please advise.

## 2019-05-27 NOTE — Telephone Encounter (Signed)
Patient called stating that she needs her disability form to be completed by Dr. Ancil Boozer (located in [employer] outgoing basket).  Patient stated that she also needs a note stating when she can return to work to be faxed to [employer] [facility] [employer] [facility]).  Patient also stated that she would go get her chest x-ray on today.

## 2019-05-27 NOTE — Telephone Encounter (Signed)
Ok, thank you.   Dr. Marcelline Mates.

## 2019-05-28 ENCOUNTER — Encounter: Payer: Self-pay | Admitting: Family Medicine

## 2019-05-28 ENCOUNTER — Encounter: Payer: No Typology Code available for payment source | Admitting: Obstetrics and Gynecology

## 2019-05-28 ENCOUNTER — Other Ambulatory Visit: Payer: No Typology Code available for payment source

## 2019-05-29 ENCOUNTER — Telehealth: Payer: Self-pay | Admitting: Surgery

## 2019-05-29 ENCOUNTER — Other Ambulatory Visit: Payer: Self-pay

## 2019-05-29 ENCOUNTER — Encounter: Payer: Self-pay | Admitting: Family Medicine

## 2019-05-29 ENCOUNTER — Ambulatory Visit (INDEPENDENT_AMBULATORY_CARE_PROVIDER_SITE_OTHER): Payer: No Typology Code available for payment source | Admitting: Family Medicine

## 2019-05-29 DIAGNOSIS — G933 Postviral fatigue syndrome: Secondary | ICD-10-CM | POA: Diagnosis not present

## 2019-05-29 DIAGNOSIS — R05 Cough: Secondary | ICD-10-CM

## 2019-05-29 DIAGNOSIS — Z8616 Personal history of COVID-19: Secondary | ICD-10-CM

## 2019-05-29 DIAGNOSIS — Z8619 Personal history of other infectious and parasitic diseases: Secondary | ICD-10-CM | POA: Diagnosis not present

## 2019-05-29 DIAGNOSIS — G9331 Postviral fatigue syndrome: Secondary | ICD-10-CM

## 2019-05-29 DIAGNOSIS — R059 Cough, unspecified: Secondary | ICD-10-CM

## 2019-05-29 NOTE — Telephone Encounter (Signed)
[facility] has called and stated that the patient would like to cancel her surgery at this time due to her being positive for Covid-19. She would like some time to recover completely before rescheduling at this time. Surgery was scheduled with Dr Pabon/Dr Cherry-Robot assisted ventral hernia repair. I have advised the OR and preadmission testing.

## 2019-05-29 NOTE — Progress Notes (Signed)
Name: Sharon Herring   MRN: [id number]    DOB: 1976-06-14   Date:05/29/2019       Progress Note  Subjective  Chief Complaint  Chief Complaint  Patient presents with  . Work Release  . COVID-19    took 2 test- first one was negative and second was positive Sept 17 was positive    I connected with  Annikah L Green on 05/29/19 at 10:00 AM EDT by telephone and verified that I am speaking with the correct person using two identifiers.  I discussed the limitations, risks, security and privacy concerns of performing an evaluation and management service by telephone and the availability of in person appointments. Staff also discussed with the patient that there may be a patient responsible charge related to this service. Patient Location: at mother's home Provider Location: [facility]   HPI  COVID-19 pneumonia follow up: she was diagnosed with COVID on 05/09/2019, out of work since 05/07/2019. She went to [facility] twice, she took two rounds of Zpack, CXR showed pneumonia, she also had gastroenteritis and got dehydrated, she finished prednisone taper and has used Breo and Ventolin but continues to have a cough and some SOB with activity. She continues to have coughing spells after she eats meat and vomits, so she is on a bland diet , lots of broth and vegetables. No rashes. She feels tired easily and takes naps when tired. Some chest tightness when she breaths, no leg pains. She states she feels like she can go back to work on Monday. Last COVID-19 was negative,    Patient Active Problem List   Diagnosis Date Noted  . Lymphadenopathy 01/16/2018  . Moderate obstructive sleep apnea 12/19/2017  . Swelling of limb 01/27/2017  . B12 deficiency 03/28/2016  . Vitamin D deficiency 03/28/2016  . Hyperglycemia 03/28/2016  . Multiple gastric polyps 01/08/2016  . Menorrhagia 11/13/2015  . Chronic constipation 09/23/2015  . Obesity, Class I, BMI 30.0-34.9 (see actual BMI) 06/02/2015  .  Iron deficiency anemia 03/14/2014  . History of uterine fibroid 03/14/2014    Past Surgical History:  Procedure Laterality Date  . APPENDECTOMY  2012  . ESOPHAGOGASTRODUODENOSCOPY (EGD) WITH PROPOFOL N/A 01/06/2016   Procedure: ESOPHAGOGASTRODUODENOSCOPY (EGD) WITH PROPOFOL;  Surgeon: Robert Bellow, MD;  Location: [facility] ENDOSCOPY;  Service: Endoscopy;  Laterality: N/A;  . ROBOTIC ASSISTED LAPAROSCOPIC CHOLECYSTECTOMY N/A 03/03/2016   Procedure: ROBOTIC ASSISTED LAPAROSCOPIC CHOLECYSTECTOMY ;  Surgeon: Clayburn Pert, MD;  Location: [facility] ORS;  Service: General;  Laterality: N/A;  . UTERINE FIBROID SURGERY  2012    Family History  Problem Relation Age of Onset  . Hypertension Mother   . Hypertension Father   . Pancreatic cancer Father 41  . Breast cancer Neg Hx   . Colon cancer Neg Hx     Social History   Socioeconomic History  . Marital status: Single    Spouse name: Not on file  . Number of children: 0  . Years of education: Not on file  . Highest education level: Bachelor's degree (e.g., BA, AB, BS)  Occupational History  . Occupation: CMA  Social Needs  . Financial resource strain: Not hard at all  . Food insecurity    Worry: Never true    Inability: Never true  . Transportation needs    Medical: No    Non-medical: No  Tobacco Use  . Smoking status: Never Smoker  . Smokeless tobacco: Never Used  Substance and Sexual Activity  . Alcohol use:  No    Alcohol/week: 0.0 standard drinks  . Drug use: No  . Sexual activity: Not Currently    Partners: Male    Birth control/protection: Condom  Lifestyle  . Physical activity    Days per week: 7 days    Minutes per session: 30 min  . Stress: Not at all  Relationships  . Social connections    Talks on phone: More than three times a week    Gets together: More than three times a week    Attends religious service: More than 4 times per year    Active member of club or organization: Yes    Attends meetings of clubs or  organizations: More than 4 times per year    Relationship status: Never married  . Intimate partner violence    Fear of current or ex partner: No    Emotionally abused: No    Physically abused: No    Forced sexual activity: No  Other Topics Concern  . Not on file  Social History Narrative   Lives alone, works for [employer] at the [employer] , but switching to [employer]      Current Outpatient Medications:  .  albuterol (VENTOLIN HFA) 108 (90 Base) MCG/ACT inhaler, Inhale 2 puffs into the lungs every 6 (six) hours as needed for wheezing or shortness of breath., Disp: 18 g, Rfl: 0 .  fluticasone (FLONASE) 50 MCG/ACT nasal spray, Place 2 sprays into both nostrils daily., Disp: 16 g, Rfl: 6 .  furosemide (LASIX) 20 MG tablet, Take 1 tablet (20 mg total) by mouth daily. (Patient taking differently: Take 20 mg by mouth daily as needed. ), Disp: 30 tablet, Rfl: 1 .  ibuprofen (ADVIL,MOTRIN) 800 MG tablet, Take 1 tablet (800 mg total) by mouth every 8 (eight) hours as needed., Disp: 60 tablet, Rfl: 3 .  iron polysaccharides (NIFEREX) 150 MG capsule, Take 150 mg by mouth every other day., Disp: , Rfl:  .  Iron-FA-B Cmp-C-Biot-Probiotic (FUSION PLUS) CAPS, Take 1 capsule by mouth daily., Disp: , Rfl:  .  linaclotide (LINZESS) 145 MCG CAPS capsule, Take 1 capsule (145 mcg total) by mouth daily before breakfast., Disp: 30 capsule, Rfl: 2 .  Multiple Vitamins-Minerals (MULTIVITAMIN ADULT) TABS, Take 1 tablet by mouth daily., Disp: 30 tablet, Rfl: 0 .  norethindrone (AYGESTIN) 5 MG tablet, Take 1 tablet (5 mg total) by mouth daily., Disp: 30 tablet, Rfl: 2 .  ondansetron (ZOFRAN ODT) 4 MG disintegrating tablet, Take 1 tablet (4 mg total) by mouth every 8 (eight) hours as needed., Disp: 20 tablet, Rfl: 0 .  PREVIDENT 5000 BOOSTER PLUS 1.1 % PSTE, , Disp: , Rfl: 2 .  azithromycin (ZITHROMAX) 250 MG tablet, Take 2 tablets PO on day 1, then take 1 tablet PO daily for 4 more days (Patient not taking: Reported on  05/29/2019), Disp: 6 each, Rfl: 0 .  predniSONE (DELTASONE) 10 MG tablet, Take 4 tabs (40 mg) PO x 2 days, then take 2 tabs (20 mg) PO x 2 days, then take 1 tab (10 mg) PO x 2 days, then take 1/2 tab (5 mg) PO x 4 days. (Patient not taking: Reported on 05/29/2019), Disp: 16 tablet, Rfl: 0  Allergies  Allergen Reactions  . Aspirin Nausea And Vomiting  . Oxycodone-Acetaminophen Rash and Shortness Of Breath  . Lexapro [Escitalopram] Palpitations  . Tape Other (See Comments)    Burning feeling. Has left mark on skin.  Melynda Keller [Lorcaserin Hcl] Other (See Comments)  headache  . Contrave [Naltrexone-Bupropion Hcl Er]   . Dicyclomine Nausea And Vomiting  . Norethindrone Rash    I personally reviewed active problem list, medication list, allergies, family history, social history, health maintenance with the patient/caregiver today.   ROS  Ten systems reviewed and is negative except as mentioned in HPI   Objective  Virtual encounter, vitals not obtained.  There is no height or weight on file to calculate BMI.  Physical Exam  Awake, alert and oriented  PHQ2/9: Depression screen [facility] 2/9 05/29/2019 05/16/2019 05/08/2019 01/22/2019 01/22/2019  Decreased Interest 0 0 0 0 0  Down, Depressed, Hopeless 0 0 0 0 0  PHQ - 2 Score 0 0 0 0 0  Altered sleeping 0 0 0 0 0  Tired, decreased energy 0 0 0 2 0  Change in appetite 0 0 0 0 0  Feeling bad or failure about yourself  0 0 0 0 0  Trouble concentrating 0 0 0 0 0  Moving slowly or fidgety/restless 0 0 0 0 0  Suicidal thoughts 0 0 0 0 0  PHQ-9 Score 0 0 0 2 0  Difficult doing work/chores Not difficult at all - Not difficult at all Not difficult at all -  Some recent data might be hidden   PHQ-2/9 Result is negative.    Fall Risk: Fall Risk  05/16/2019 05/08/2019 01/22/2019 10/17/2018 08/07/2018  Falls in the past year? 0 0 0 0 0  Number falls in past yr: 0 0 0 - 0  Injury with Fall? 0 0 0 - 0     Assessment & Plan   1. History of 2019  novel coronavirus disease (COVID-19)  May return to work on Monday   2. Cough  Discussed mucinex and resume Breo daily   3. Postviral fatigue syndrome  Stay hydrated, try to eat balanced meals and nap when possible   I discussed the assessment and treatment plan with the patient. The patient was provided an opportunity to ask questions and all were answered. The patient agreed with the plan and demonstrated an understanding of the instructions.   The patient was advised to call back or seek an in-person evaluation if the symptoms worsen or if the condition fails to improve as anticipated.  I provided 15  minutes of non-face-to-face time during this encounter.  Loistine Chance, MD

## 2019-06-03 ENCOUNTER — Telehealth: Payer: Self-pay | Admitting: Family Medicine

## 2019-06-03 ENCOUNTER — Ambulatory Visit: Payer: No Typology Code available for payment source

## 2019-06-03 NOTE — Telephone Encounter (Signed)
Patient informed she is able to get the Flu Shot now per Dr. Ancil Boozer.

## 2019-06-03 NOTE — Telephone Encounter (Signed)
Pt was dx a couple weeks back with covid. She is feeling better but wanted to know if it is okay for her to get her flu shot or if she need to wait. Please advise

## 2019-06-10 ENCOUNTER — Ambulatory Visit: Payer: No Typology Code available for payment source | Admitting: Surgery

## 2019-06-13 ENCOUNTER — Other Ambulatory Visit: Payer: No Typology Code available for payment source

## 2019-06-17 ENCOUNTER — Ambulatory Visit (INDEPENDENT_AMBULATORY_CARE_PROVIDER_SITE_OTHER): Payer: No Typology Code available for payment source

## 2019-06-17 ENCOUNTER — Inpatient Hospital Stay: Payer: No Typology Code available for payment source | Attending: Hematology and Oncology

## 2019-06-17 ENCOUNTER — Other Ambulatory Visit: Payer: No Typology Code available for payment source

## 2019-06-17 ENCOUNTER — Encounter (HOSPITAL_COMMUNITY): Payer: Self-pay

## 2019-06-17 ENCOUNTER — Other Ambulatory Visit: Payer: Self-pay

## 2019-06-17 ENCOUNTER — Ambulatory Visit (HOSPITAL_COMMUNITY)
Admission: EM | Admit: 2019-06-17 | Discharge: 2019-06-17 | Disposition: A | Discharge status: Home / Self Care | Payer: No Typology Code available for payment source | Attending: Family Medicine | Admitting: Family Medicine

## 2019-06-17 DIAGNOSIS — Z8619 Personal history of other infectious and parasitic diseases: Secondary | ICD-10-CM

## 2019-06-17 DIAGNOSIS — R5383 Other fatigue: Secondary | ICD-10-CM

## 2019-06-17 DIAGNOSIS — Z8616 Personal history of COVID-19: Secondary | ICD-10-CM

## 2019-06-17 DIAGNOSIS — R0789 Other chest pain: Secondary | ICD-10-CM

## 2019-06-17 DIAGNOSIS — R0602 Shortness of breath: Secondary | ICD-10-CM

## 2019-06-17 DIAGNOSIS — R05 Cough: Secondary | ICD-10-CM

## 2019-06-17 DIAGNOSIS — R059 Cough, unspecified: Secondary | ICD-10-CM

## 2019-06-17 MED ORDER — ALBUTEROL SULFATE HFA 108 (90 BASE) MCG/ACT IN AERS: 2.0000 | INHALATION_SPRAY | Freq: Four times a day (QID) | RESPIRATORY_TRACT | 0 refills | Status: DC | PRN

## 2019-06-17 NOTE — Discharge Instructions (Signed)
Your chest xray remains normal tonight which is reassuring.  I suspect that your symptoms are still post-viral related, unfortunately.  Rest as able.  Tylenol and/or ibuprofen as needed for pain or fevers.   Your inhalers as needed as prescribed.  I would definitely continue to work with your primary care provider as needed for persistent symptoms.  Any worsening of chest pain , shortness of breath , fevers, or otherwise worsening please return or go to the ER.

## 2019-06-17 NOTE — ED Triage Notes (Signed)
Pt presents with shortness of breath, chills, and headache for past few weeks with today being progressively worse.  Pt was diagnosed with Covid September 14th

## 2019-06-17 NOTE — ED Provider Notes (Signed)
[facility]    CSN: [id number] Arrival date & time: 06/17/19  1825      History   Chief Complaint Chief Complaint  Patient presents with  . Shortness of Breath  . Headache  . Chest Tightness    HPI Sharon Herring Sharon Herring is a 43 y.o. female.   Sharon Herring presents with complaints of recurrence of chest heaviness, shortness of breath and fatigue. Only now with a rare cough. 9/16 was found to have covid-19, by 9/20 had pneumonia on chest xray. 10/5 repeat xray pneumonia had cleared, patient had started to feel better. She returned to work on 10/9. Has been using breo as well as albuterol which have minimally helped. No history of asthma but have been using these since her covid diagnosis. No gi symptoms. No further ill contacts. Got her flu vaccine last week. No urinary symptoms. She has some nasal drainage for the past week. Has been using nasacort. Sight headache. Temp this afternoon of 101 after work, tylenol has since helped. Feels similar to her previous symptoms, although not quite as severe. History  Of anemia, gerd, migraines.    ROS per HPI, negative if not otherwise mentioned.      Past Medical History:  Diagnosis Date  . Anemia   . Dizziness   . Fibroid uterus   . GERD (gastroesophageal reflux disease)   . IBS (irritable bowel syndrome)   . Migraine   . Polyp of stomach 01/06/2016   Stomach polyp, pyloric, consistent with prolapse.     Patient Active Problem List   Diagnosis Date Noted  . Lymphadenopathy 01/16/2018  . Moderate obstructive sleep apnea 12/19/2017  . Swelling of limb 01/27/2017  . B12 deficiency 03/28/2016  . Vitamin D deficiency 03/28/2016  . Hyperglycemia 03/28/2016  . Multiple gastric polyps 01/08/2016  . Menorrhagia 11/13/2015  . Chronic constipation 09/23/2015  . Obesity, Class I, BMI 30.0-34.9 (see actual BMI) 06/02/2015  . Iron deficiency anemia 03/14/2014  . History of uterine fibroid 03/14/2014    Past Surgical History:   Procedure Laterality Date  . APPENDECTOMY  2012  . ESOPHAGOGASTRODUODENOSCOPY (EGD) WITH PROPOFOL N/A 01/06/2016   Procedure: ESOPHAGOGASTRODUODENOSCOPY (EGD) WITH PROPOFOL;  Surgeon: Robert Bellow, MD;  Location: [facility] ENDOSCOPY;  Service: Endoscopy;  Laterality: N/A;  . ROBOTIC ASSISTED LAPAROSCOPIC CHOLECYSTECTOMY N/A 03/03/2016   Procedure: ROBOTIC ASSISTED LAPAROSCOPIC CHOLECYSTECTOMY ;  Surgeon: Clayburn Pert, MD;  Location: [facility] ORS;  Service: General;  Laterality: N/A;  . UTERINE FIBROID SURGERY  2012    OB History    Gravida  0   Para  0   Term  0   Preterm  0   AB  0   Living  0     SAB  0   TAB  0   Ectopic  0   Multiple  0   Live Births           Obstetric Comments  1st Menstrual Cycle:  15            Home Medications    Prior to Admission medications   Medication Sig Start Date End Date Taking? Authorizing Provider  albuterol (VENTOLIN HFA) 108 (90 Base) MCG/ACT inhaler Inhale 2 puffs into the lungs every 6 (six) hours as needed for wheezing or shortness of breath. 06/17/19   Zigmund Gottron, NP  fluticasone (FLONASE) 50 MCG/ACT nasal spray Place 2 sprays into both nostrils daily. 08/21/17   Sharion Balloon, FNP  fluticasone furoate-vilanterol (BREO  ELLIPTA) 100-25 MCG/INH AEPB Inhale 1 puff into the lungs daily.    [provider]  furosemide (LASIX) 20 MG tablet Take 1 tablet (20 mg total) by mouth daily. Patient taking differently: Take 20 mg by mouth daily as needed.  07/17/18   Steele Sizer, MD  ibuprofen (ADVIL,MOTRIN) 800 MG tablet Take 1 tablet (800 mg total) by mouth every 8 (eight) hours as needed. 05/10/17   Rubie Maid, MD  iron polysaccharides (NIFEREX) 150 MG capsule Take 150 mg by mouth every other day.    [provider]  Iron-FA-B Cmp-C-Biot-Probiotic (FUSION PLUS) CAPS Take 1 capsule by mouth daily.    [provider]  linaclotide Rolan Lipa) 145 MCG CAPS capsule Take 1 capsule (145 mcg total)  by mouth daily before breakfast. 12/16/15   Steele Sizer, MD  Multiple Vitamins-Minerals (MULTIVITAMIN ADULT) TABS Take 1 tablet by mouth daily. 01/04/17   Steele Sizer, MD  norethindrone (AYGESTIN) 5 MG tablet Take 1 tablet (5 mg total) by mouth daily. 10/18/18   Rubie Maid, MD  ondansetron (ZOFRAN ODT) 4 MG disintegrating tablet Take 1 tablet (4 mg total) by mouth every 8 (eight) hours as needed. 05/14/19   Gregor Hams, MD  PREVIDENT 5000 BOOSTER PLUS 1.1 % PSTE  07/13/18   [provider]    Family History Family History  Problem Relation Age of Onset  . Hypertension Mother   . Hypertension Father   . Pancreatic cancer Father 13  . Breast cancer Neg Hx   . Colon cancer Neg Hx     Social History Social History   Tobacco Use  . Smoking status: Never Smoker  . Smokeless tobacco: Never Used  Substance Use Topics  . Alcohol use: No    Alcohol/week: 0.0 standard drinks  . Drug use: No     Allergies   Aspirin, Oxycodone-acetaminophen, Lexapro [escitalopram], Tape, Belviq [lorcaserin hcl], Contrave [naltrexone-bupropion hcl er], Dicyclomine, and Norethindrone   Review of Systems Review of Systems   Physical Exam Triage Vital Signs ED Triage Vitals  Enc Vitals Group     BP 06/17/19 1845 124/86     Pulse Rate 06/17/19 1845 88     Resp 06/17/19 1845 16     Temp 06/17/19 1845 98.7 F (37.1 C)     Temp Source 06/17/19 1845 Oral     SpO2 06/17/19 1845 100 %     Weight --      Height --      Head Circumference --      Peak Flow --      Pain Score 06/17/19 1846 3     Pain Loc --      Pain Edu? --      Excl. in [place]? --    No data found.  Updated Vital Signs BP 124/86 (BP Location: Left Arm)   Pulse 88   Temp 98.7 F (37.1 C) (Oral)   Resp 16   LMP 06/08/2019   SpO2 100%   Visual Acuity Right Eye Distance:   Left Eye Distance:   Bilateral Distance:    Right Eye Near:   Left Eye Near:    Bilateral Near:     Physical Exam  Constitutional:      General: She is not in acute distress.    Appearance: She is well-developed.  Cardiovascular:     Rate and Rhythm: Normal rate and regular rhythm.     Heart sounds: Normal heart sounds.  Pulmonary:     Effort:  Pulmonary effort is normal.     Breath sounds: Normal breath sounds.  Skin:    General: Skin is warm and dry.  Neurological:     Mental Status: She is alert and oriented to person, place, and time.      UC Treatments / Results  Labs (all labs ordered are listed, but only abnormal results are displayed) Labs Reviewed - No data to display  EKG   Radiology Dg Chest 2 View  Result Date: 06/17/2019 CLINICAL DATA:  Chest pain and shortness of breath for 5 days, recently tested positive for COVID-19 in September 2020 EXAM: CHEST - 2 VIEW COMPARISON:  05/27/2019 FINDINGS: Upper normal heart size. Mediastinal contours and pulmonary vascularity normal. Lungs clear. No infiltrate, pleural effusion or pneumothorax. Osseous structures unremarkable. IMPRESSION: No acute abnormalities. Electronically Signed   By: Lavonia Dana M.D.   On: 06/17/2019 19:41    Procedures Procedures (including critical care time)  Medications Ordered in UC Medications - No data to display  Initial Impression / Assessment and Plan / UC Course  I have reviewed the triage vital signs and the nursing notes.  Pertinent labs & imaging results that were available during my care of the patient were reviewed by me and considered in my medical decision making (see chart for details).     Chest xray remains without acute findings here today. covid symptoms which have been waxing and waning, worse over the past few days while working especially. Temperature controlled with tylenol. No red flag findings on exam. Unfortunately it appears this is still likely related to a covid post viral type syndrome. Encouraged continued follow up with pcp, as may need further evaluation if symptoms persist.  Return precautions provided. Patient verbalized understanding and agreeable to plan.  Ambulatory out of clinic without difficulty.    Final Clinical Impressions(s) / UC Diagnoses   Final diagnoses:  Shortness of breath  Fatigue, unspecified type  History of 2019 novel coronavirus disease (COVID-19)     Discharge Instructions     Your chest xray remains normal tonight which is reassuring.  I suspect that your symptoms are still post-viral related, unfortunately.  Rest as able.  Tylenol and/or ibuprofen as needed for pain or fevers.   Your inhalers as needed as prescribed.  I would definitely continue to work with your primary care provider as needed for persistent symptoms.  Any worsening of chest pain , shortness of breath , fevers, or otherwise worsening please return or go to the ER.    ED Prescriptions    Medication Sig Dispense Auth. Provider   albuterol (VENTOLIN HFA) 108 (90 Base) MCG/ACT inhaler Inhale 2 puffs into the lungs every 6 (six) hours as needed for wheezing or shortness of breath. 18 g Zigmund Gottron, NP     PDMP not reviewed this encounter.   Zigmund Gottron, NP 06/17/19 2322

## 2019-06-19 ENCOUNTER — Inpatient Hospital Stay: Payer: No Typology Code available for payment source | Attending: Hematology and Oncology

## 2019-06-19 ENCOUNTER — Ambulatory Visit: Payer: No Typology Code available for payment source

## 2019-06-19 ENCOUNTER — Telehealth: Payer: Self-pay | Admitting: Hematology and Oncology

## 2019-06-19 ENCOUNTER — Ambulatory Visit: Payer: No Typology Code available for payment source | Admitting: Hematology and Oncology

## 2019-06-19 NOTE — Telephone Encounter (Signed)
I left this patient several messages on cell phone and work about her appointment. The patient never called back and did not show up for her appointment.

## 2019-06-20 ENCOUNTER — Telehealth: Payer: Self-pay | Admitting: Emergency Medicine

## 2019-06-20 ENCOUNTER — Ambulatory Visit: Admit: 2019-06-20 | Payer: No Typology Code available for payment source | Admitting: Surgery

## 2019-06-20 SURGERY — REPAIR, HERNIA, VENTRAL, ROBOT-ASSISTED
Anesthesia: General

## 2019-06-20 MED ORDER — ALBUTEROL SULFATE (2.5 MG/3ML) 0.083% IN NEBU: 2.5000 mg | INHALATION_SOLUTION | Freq: Four times a day (QID) | RESPIRATORY_TRACT | 1 refills | Status: DC | PRN

## 2019-06-20 NOTE — Telephone Encounter (Signed)
Would like a script for albuterol for nebulizer machine called into pharmacy

## 2019-06-26 NOTE — Progress Notes (Signed)
[facility]  [address], [address] [place], [place] [address] Phone: [phone]  Fax: [phone]   Clinic Day:  06/28/2019  Referring physician: Steele Sizer, MD  Chief Complaint: Sharon Herring is a 43 y.o. female with iron deficiency anemia who is seen for 3 month assessment   HPI: The patient was last seen in the hematology clinic on 03/27/2019. At that time, she was fatigued.  She denied any melena or hematochezia.  She had ice pica.  Hematocrit was 28.7, hemoglobin 8.6, and MCV 73.2.  Ferritin was 5.    She Feraheme on 03/27/2019 and 05/03/2019.   She received a B-12 injection on 03/27/2019.   She tested + for COVID-19 on 05/09/2019.  She wan seen in [facility] ER for lower respiratory infection due to COVID-19 and viral pneumonia and on 05/12/2019. Her mother was diagnosed the week prior. She had body aches, chills,SOB, low grade fever. Exertion exacerbates symptoms. She received Zithromax 250 mg and prednisone. CXR revealed interstitial and vague airspace opacity in the mid to lower lung zone, suspect for atypical/viral pneumonia.  She was seen again in [facility] ER for nausea and diarrhea related to secondary antibiotic 05/14/2019. She was given ondansetron.  She was seen for follow up with Dr. Ancil Boozer on 05/16/2019. Diarrhea had resolved.  She felt fatigued; she still had some vomiting, but it was improving. She as seen again on 05/29/2019 and felt ready to return to work.  COVID-19 test returned negative. She was cleared to return to work the following week.  She was seen on [facility] ER on 06/17/2019 for recurrent SOB, chest tightness and fatigue. Albuterol minimally helped. She was given albuterol on discharge.  CXR on 05/27/2019 revealed near total resolution of previous peripheral heterogeneous airspace cavity opacity. CXR on 06/17/2019 showed no acute abnormalities.  She canceled her surgery with Dr. Dahlia Byes for ventral hernia repair.   CBCs followed:   05/12/2019: Hematocrit 35.8, hemoglobin 11.0, MCV 75.7, platelets 432,000, WBC 5700, ANC 3500.  05/14/2019: Hematocrit 35.4, hemoglobin 10.9, MCV 74.8, platelets 520,000, WBC 4700.   During the interim, she has been doing much better and has fully recovered from [place]. She hasn't had to use the albuterol in about a week. She uses albuterol PRN. She has rescheduled to see Dr. Sharyon Medicus 07/03/2019.  Symptomatically, she remains chronically fatigued. She goes straight to bed after work. She denies any ice pica or restless leg; she has some some swelling.  She has had no SOB for about 1 week.  Periods are still heavy;  She sees Dr. Marcelline Mates on 07/16/2019. She plans to have fibroid removal and hernia repair at the same time.  Surgery is yet to be scheduled.    Past Medical History:  Diagnosis Date  . Anemia   . Dizziness   . Fibroid uterus   . GERD (gastroesophageal reflux disease)   . IBS (irritable bowel syndrome)   . Migraine   . Polyp of stomach 01/06/2016   Stomach polyp, pyloric, consistent with prolapse.     Past Surgical History:  Procedure Laterality Date  . APPENDECTOMY  2012  . ESOPHAGOGASTRODUODENOSCOPY (EGD) WITH PROPOFOL N/A 01/06/2016   Procedure: ESOPHAGOGASTRODUODENOSCOPY (EGD) WITH PROPOFOL;  Surgeon: Robert Bellow, MD;  Location: [facility] ENDOSCOPY;  Service: Endoscopy;  Laterality: N/A;  . ROBOTIC ASSISTED LAPAROSCOPIC CHOLECYSTECTOMY N/A 03/03/2016   Procedure: ROBOTIC ASSISTED LAPAROSCOPIC CHOLECYSTECTOMY ;  Surgeon: Clayburn Pert, MD;  Location: [facility] ORS;  Service: General;  Laterality: N/A;  . UTERINE FIBROID SURGERY  2012    Family History  Problem Relation Age of Onset  . Hypertension Mother   . Hypertension Father   . Pancreatic cancer Father 20  . Breast cancer Neg Hx   . Colon cancer Neg Hx     Social History:  reports that she has never smoked. She has never used smokeless tobacco. She reports that she does not drink alcohol or use drugs. She works at  [employer] as a Psychologist, sport and exercise. She lives in [place]. Her brother passed away from a blood clot in 04/12/19 and her grandfather passed away in Apr 12, 2019 from pneumonia The patient is alone today.  Allergies:  Allergies  Allergen Reactions  . Aspirin Nausea And Vomiting  . Oxycodone-Acetaminophen Rash and Shortness Of Breath  . Lexapro [Escitalopram] Palpitations  . Tape Other (See Comments)    Burning feeling. Has left mark on skin.  Melynda Keller [Lorcaserin Hcl] Other (See Comments)    headache  . Contrave [Naltrexone-Bupropion Hcl Er]   . Dicyclomine Nausea And Vomiting  . Norethindrone Rash    Current Medications: Current Outpatient Medications  Medication Sig Dispense Refill  . albuterol (PROVENTIL) (2.5 MG/3ML) 0.083% nebulizer solution Take 3 mLs (2.5 mg total) by nebulization every 6 (six) hours as needed for wheezing or shortness of breath. 150 mL 1  . albuterol (VENTOLIN HFA) 108 (90 Base) MCG/ACT inhaler Inhale 2 puffs into the lungs every 6 (six) hours as needed for wheezing or shortness of breath. 18 g 0  . fluticasone (FLONASE) 50 MCG/ACT nasal spray Place 2 sprays into both nostrils daily. 16 g 6  . furosemide (LASIX) 20 MG tablet Take 1 tablet (20 mg total) by mouth daily. (Patient taking differently: Take 20 mg by mouth daily as needed. ) 30 tablet 1  . iron polysaccharides (NIFEREX) 150 MG capsule Take 150 mg by mouth every other day.    . Iron-FA-B Cmp-C-Biot-Probiotic (FUSION PLUS) CAPS Take 1 capsule by mouth daily.    Marland Kitchen linaclotide (LINZESS) 145 MCG CAPS capsule Take 1 capsule (145 mcg total) by mouth daily before breakfast. 30 capsule 2  . Multiple Vitamins-Minerals (MULTIVITAMIN ADULT) TABS Take 1 tablet by mouth daily. 30 tablet 0  . norethindrone (AYGESTIN) 5 MG tablet Take 1 tablet (5 mg total) by mouth daily. 30 tablet 2  . PREVIDENT 5000 BOOSTER PLUS 1.1 % PSTE   2  . fluticasone furoate-vilanterol (BREO ELLIPTA) 100-25 MCG/INH AEPB Inhale 1 puff into the  lungs daily.    Marland Kitchen ibuprofen (ADVIL,MOTRIN) 800 MG tablet Take 1 tablet (800 mg total) by mouth every 8 (eight) hours as needed. (Patient not taking: Reported on 06/27/2019) 60 tablet 3  . ondansetron (ZOFRAN ODT) 4 MG disintegrating tablet Take 1 tablet (4 mg total) by mouth every 8 (eight) hours as needed. (Patient not taking: Reported on 06/27/2019) 20 tablet 0   No current facility-administered medications for this visit.     Review of Systems  Constitutional: Positive for malaise/fatigue and weight loss (10 lb since 03/27/2019). Negative for chills, diaphoresis and fever.       Doing "ok, just tired."  HENT: Negative.  Negative for congestion, ear pain, hearing loss, nosebleeds, sinus pain and sore throat.   Eyes: Negative.  Negative for blurred vision, double vision and photophobia.  Respiratory: Negative.  Negative for cough, shortness of breath and wheezing.   Cardiovascular: Positive for leg swelling (lower extremity). Negative for chest pain, palpitations and PND.  Gastrointestinal: Negative.  Negative for abdominal pain, blood in stool,  constipation, diarrhea, heartburn, nausea and vomiting.       Eating well.  No pica.  Genitourinary: Negative.  Negative for dysuria, frequency, hematuria and urgency.       Menses heavy.  Musculoskeletal: Negative.  Negative for falls, joint pain, myalgias and neck pain.  Skin: Negative.  Negative for itching and rash.  Neurological: Negative.  Negative for dizziness, tingling, sensory change, speech change, focal weakness, weakness and headaches.  Endo/Heme/Allergies: Negative.  Does not bruise/bleed easily.  Psychiatric/Behavioral: Negative for depression and memory loss. The patient is not nervous/anxious.   All other systems reviewed and are negative.  Performance status (ECOG):  1  Vitals Blood pressure (!) 123/91, pulse 75, temperature 98.3 F (36.8 C), resp. rate 18, weight 216 lb 9.6 oz (98.2 kg), last menstrual period 06/08/2019.    Physical Exam  Constitutional: She is oriented to person, place, and time. She appears well-developed and well-nourished. No distress.  HENT:  Head: Normocephalic and atraumatic.  Mouth/Throat: Oropharynx is clear and moist. No oropharyngeal exudate.  Black hair. Mask.  Eyes: Pupils are equal, round, and reactive to light. Conjunctivae and EOM are normal. No scleral icterus.  Brown eyes.  Neck: Normal range of motion. Neck supple. No JVD present.  Cardiovascular: Normal rate, regular rhythm and normal heart sounds. Exam reveals no gallop and no friction rub.  No murmur heard. Pulmonary/Chest: Effort normal and breath sounds normal. No respiratory distress. She has no wheezes. She has no rales.  Abdominal: Soft. Bowel sounds are normal. She exhibits no distension. There is no abdominal tenderness. There is no rebound and no guarding.  Musculoskeletal: Normal range of motion.        General: No tenderness (RLE) or edema (chronic changes).  Lymphadenopathy:       Head (right side): No preauricular, no posterior auricular and no occipital adenopathy present.       Head (left side): No preauricular, no posterior auricular and no occipital adenopathy present.    She has no cervical adenopathy.    She has no axillary adenopathy.       Right: No inguinal and no supraclavicular adenopathy present.       Left: No inguinal and no supraclavicular adenopathy present.  Neurological: She is alert and oriented to person, place, and time.  Skin: Skin is warm and dry. No rash noted. She is not diaphoretic. No erythema. No pallor.  Psychiatric: She has a normal mood and affect. Her behavior is normal. Judgment and thought content normal.  Nursing note and vitals reviewed.   Appointment on 06/27/2019  Component Date Value Ref Range Status  . Iron 06/27/2019 58  28 - 170 ug/dL Final  . TIBC 06/27/2019 369  250 - 450 ug/dL Final  . Saturation Ratios 06/27/2019 16  10.4 - 31.8 % Final  . UIBC 06/27/2019  311  ug/dL Final   Performed at [facility], [address], [place], [place] [address]  . Ferritin 06/27/2019 18  11 - 307 ng/mL Final   Performed at [facility], [address] [place], [place] [address]  . WBC 06/27/2019 4.8  4.0 - 10.5 K/uL Final  . RBC 06/27/2019 4.02  3.87 - 5.11 MIL/uL Final  . Hemoglobin 06/27/2019 9.8* 12.0 - 15.0 g/dL Final  . HCT 06/27/2019 31.8* 36.0 - 46.0 % Final  . MCV 06/27/2019 79.1* 80.0 - 100.0 fL Final  . MCH 06/27/2019 24.4* 26.0 - 34.0 pg Final  . MCHC 06/27/2019 30.8  30.0 - 36.0  g/dL Final  . RDW 06/27/2019 20.4* 11.5 - 15.5 % Final  . Platelets 06/27/2019 478* 150 - 400 K/uL Final  . nRBC 06/27/2019 0.0  0.0 - 0.2 % Final  . Neutrophils Relative % 06/27/2019 51  % Final  . Neutro Abs 06/27/2019 2.4  1.7 - 7.7 K/uL Final  . Lymphocytes Relative 06/27/2019 38  % Final  . Lymphs Abs 06/27/2019 1.8  0.7 - 4.0 K/uL Final  . Monocytes Relative 06/27/2019 10  % Final  . Monocytes Absolute 06/27/2019 0.5  0.1 - 1.0 K/uL Final  . Eosinophils Relative 06/27/2019 1  % Final  . Eosinophils Absolute 06/27/2019 0.1  0.0 - 0.5 K/uL Final  . Basophils Relative 06/27/2019 0  % Final  . Basophils Absolute 06/27/2019 0.0  0.0 - 0.1 K/uL Final  . Immature Granulocytes 06/27/2019 0  % Final  . Abs Immature Granulocytes 06/27/2019 0.02  0.00 - 0.07 K/uL Final   Performed at [facility], [address] [place], [place] [address]  . Retic Ct Pct 06/27/2019 1.7  0.4 - 3.1 % Final  . RBC. 06/27/2019 4.02  3.87 - 5.11 MIL/uL Final  . Retic Count, Absolute 06/27/2019 68.7  19.0 - 186.0 K/uL Final  . Immature Retic Fract 06/27/2019 14.2  2.3 - 15.9 % Final   Performed at [facility], [address], [place],  [address]    Assessment:  Sharon Herring is a 43 y.o. female withiron deficiency anemiasecondary to heavy menses. She hasa history of on and off anemia for several years (before 02/2014). Dietis fairly good. She  denies any melena, hematochezia, or hematuria. She has hadice picax 2 months. She has a history of unresponsiveness to oral iron (4 month trial in 2015).  Labs on 11/30/2017revealed a hematocrit of 30.9, hemoglobin 9.3, MCV 73.4, platelets 675,000, white count 5300 with an ANC of 2650. Creatinine was0.7. Liver function tests were normal. Iron studies included a ferritin of 6, iron saturation 6% and TIBC of 440. HIV testing was negative.  Labs on 01/04/2018revealed a hematocrit of 28.3, hemoglobin 9.2, and MCV 69.6. Ferritin was 5.  She received Feraheme510 mg IV on 08/29/2016 and 09/05/2016, 04/13/2018, 04/20/2018, 09/19/2018, 12/20/2018, 03/27/2019 and 05/03/2019.Marland Kitchen Ferritin goalis 100.  Ferritinhas been followed: 18 on 12/02/2015, 6 on 07/21/2016, 5 on 08/25/2016, 214 on 09/21/2016, 97 on 10/10/2016, 21 on 12/08/2016, 12 on 02/09/2017, 4 on 03/23/2018, 4 on 04/13/2018, 9 on 07/10/2018, 6 on 08/03/2018, and 5 on 09/19/2018, 6 on 12/18/2018, 5 on 03/20/2019, and 18 on 06/27/2019.  She has B12 deficiency. She received B12 shots x 2-3 months in the past. B12 was 462 on 07/21/2016, 434 on 12/14/2017, and 263 on 03/20/2019.She began on oral B12 on 03/20/2019.  She has received B12 injections (last 03/27/2019).  Symptomatically, she is fatigued.  Exam is stable.  Hemoglobin is 9.8.  Ferritin is 18.  Plan: 1.   Review labs from 06/27/2019. 2. Iron deficiency anemia Hematocrit 28.7. Hemoglobin 8.6. MCV 73.2 on 03/20/2019.   Ferritin 5.  Hematocrit 31.8. Hemoglobin 9.8. MCV 79.1 on 06/27/2019.    Ferritin 18.  Etiology secondary to menorrhagia. Feraheme today and in 1 week.   Urine pregnancy test prior to infusion. 3.B12 deficiency Patient prefers B12 injections over oral B12. B12 today and monthly x 6. Folate 8.3 on 03/18/2019. 4.   RTC in 3 months for MD assessment, labs (CBC  with diff, ferritin, iron studies- day before), B12, and +/- Feraheme.  I discussed the assessment  and treatment plan with the patient.  The patient was provided an opportunity to ask questions and all were answered.  The patient agreed with the plan and demonstrated an understanding of the instructions.  The patient was advised to call back if the symptoms worsen or if the condition fails to improve as anticipated.  I provided 19 minutes (10:43 AM - 10:58 AM) of face-to-face time during this this encounter and > 50% was spent counseling as documented under my assessment and plan.    Lequita Asal, MD, PhD    06/28/2019, 10:58 AM  I, Samul Dada, am acting as a scribe for Lequita Asal, MD.  I, Nuevo Mike Gip, MD, have reviewed the above documentation for accuracy and completeness, and I agree with the above.

## 2019-06-27 ENCOUNTER — Inpatient Hospital Stay: Payer: No Typology Code available for payment source | Attending: Hematology and Oncology

## 2019-06-27 ENCOUNTER — Other Ambulatory Visit: Payer: Self-pay

## 2019-06-27 ENCOUNTER — Encounter: Payer: Self-pay | Admitting: Hematology and Oncology

## 2019-06-27 DIAGNOSIS — D500 Iron deficiency anemia secondary to blood loss (chronic): Secondary | ICD-10-CM | POA: Insufficient documentation

## 2019-06-27 DIAGNOSIS — E538 Deficiency of other specified B group vitamins: Secondary | ICD-10-CM | POA: Diagnosis present

## 2019-06-27 LAB — CBC WITH DIFFERENTIAL/PLATELET
Abs Immature Granulocytes: 0.02 10*3/uL (ref 0.00–0.07)
Basophils Absolute: 0 10*3/uL (ref 0.0–0.1)
Basophils Relative: 0 %
Eosinophils Absolute: 0.1 10*3/uL (ref 0.0–0.5)
Eosinophils Relative: 1 %
HCT: 31.8 % — ABNORMAL LOW (ref 36.0–46.0)
Hemoglobin: 9.8 g/dL — ABNORMAL LOW (ref 12.0–15.0)
Immature Granulocytes: 0 %
Lymphocytes Relative: 38 %
Lymphs Abs: 1.8 10*3/uL (ref 0.7–4.0)
MCH: 24.4 pg — ABNORMAL LOW (ref 26.0–34.0)
MCHC: 30.8 g/dL (ref 30.0–36.0)
MCV: 79.1 fL — ABNORMAL LOW (ref 80.0–100.0)
Monocytes Absolute: 0.5 10*3/uL (ref 0.1–1.0)
Monocytes Relative: 10 %
Neutro Abs: 2.4 10*3/uL (ref 1.7–7.7)
Neutrophils Relative %: 51 %
Platelets: 478 10*3/uL — ABNORMAL HIGH (ref 150–400)
RBC: 4.02 MIL/uL (ref 3.87–5.11)
RDW: 20.4 % — ABNORMAL HIGH (ref 11.5–15.5)
WBC: 4.8 10*3/uL (ref 4.0–10.5)
nRBC: 0 % (ref 0.0–0.2)

## 2019-06-27 LAB — RETICULOCYTES
Immature Retic Fract: 14.2 % (ref 2.3–15.9)
RBC.: 4.02 MIL/uL (ref 3.87–5.11)
Retic Count, Absolute: 68.7 10*3/uL (ref 19.0–186.0)
Retic Ct Pct: 1.7 % (ref 0.4–3.1)

## 2019-06-27 LAB — IRON AND TIBC
Iron: 58 ug/dL (ref 28–170)
Saturation Ratios: 16 % (ref 10.4–31.8)
TIBC: 369 ug/dL (ref 250–450)
UIBC: 311 ug/dL

## 2019-06-27 LAB — FERRITIN: Ferritin: 18 ng/mL (ref 11–307)

## 2019-06-27 NOTE — Progress Notes (Signed)
No new changes noted today. The patient Name and DOB has been verified by phone today. 

## 2019-06-28 ENCOUNTER — Ambulatory Visit: Payer: No Typology Code available for payment source | Admitting: Hematology and Oncology

## 2019-06-28 ENCOUNTER — Inpatient Hospital Stay: Payer: No Typology Code available for payment source

## 2019-06-28 ENCOUNTER — Encounter: Payer: Self-pay | Admitting: Hematology and Oncology

## 2019-06-28 ENCOUNTER — Inpatient Hospital Stay (HOSPITAL_BASED_OUTPATIENT_CLINIC_OR_DEPARTMENT_OTHER): Payer: No Typology Code available for payment source | Admitting: Hematology and Oncology

## 2019-06-28 VITALS — BP 123/91 | HR 75 | Temp 98.3°F | Resp 18 | Wt 216.6 lb

## 2019-06-28 DIAGNOSIS — D509 Iron deficiency anemia, unspecified: Secondary | ICD-10-CM

## 2019-06-28 DIAGNOSIS — D500 Iron deficiency anemia secondary to blood loss (chronic): Secondary | ICD-10-CM | POA: Diagnosis not present

## 2019-06-28 DIAGNOSIS — E538 Deficiency of other specified B group vitamins: Secondary | ICD-10-CM | POA: Diagnosis not present

## 2019-06-28 LAB — PREGNANCY, URINE: Preg Test, Ur: NEGATIVE

## 2019-06-28 MED ORDER — SODIUM CHLORIDE 0.9 % IV SOLN
510.0000 mg | Freq: Once | INTRAVENOUS | Status: AC
  Administered 2019-06-28: 510 mg via INTRAVENOUS
  Filled 2019-06-28: qty 17

## 2019-06-28 MED ORDER — CYANOCOBALAMIN 1000 MCG/ML IJ SOLN
1000.0000 ug | Freq: Once | INTRAMUSCULAR | Status: AC
  Administered 2019-06-28: 1000 ug via INTRAMUSCULAR
  Filled 2019-06-28: qty 1

## 2019-06-28 MED ORDER — SODIUM CHLORIDE 0.9 % IV SOLN
Freq: Once | INTRAVENOUS | Status: AC
  Administered 2019-06-28: 11:00:00 via INTRAVENOUS
  Filled 2019-06-28: qty 250

## 2019-06-28 NOTE — Progress Notes (Signed)
Pt in for follow up, reports 3 weeks post covid. Reports still feeling fatigued and weak.

## 2019-06-28 NOTE — Patient Instructions (Signed)

## 2019-07-02 ENCOUNTER — Ambulatory Visit (INDEPENDENT_AMBULATORY_CARE_PROVIDER_SITE_OTHER): Payer: No Typology Code available for payment source | Admitting: Family Medicine

## 2019-07-02 ENCOUNTER — Telehealth: Payer: Self-pay | Admitting: Family Medicine

## 2019-07-02 ENCOUNTER — Encounter: Payer: Self-pay | Admitting: Family Medicine

## 2019-07-02 ENCOUNTER — Other Ambulatory Visit: Payer: Self-pay

## 2019-07-02 VITALS — BP 126/80 | HR 61 | Temp 98.4°F | Wt 198.8 lb

## 2019-07-02 DIAGNOSIS — Z8619 Personal history of other infectious and parasitic diseases: Secondary | ICD-10-CM | POA: Diagnosis not present

## 2019-07-02 DIAGNOSIS — E786 Lipoprotein deficiency: Secondary | ICD-10-CM

## 2019-07-02 DIAGNOSIS — D500 Iron deficiency anemia secondary to blood loss (chronic): Secondary | ICD-10-CM

## 2019-07-02 DIAGNOSIS — G4733 Obstructive sleep apnea (adult) (pediatric): Secondary | ICD-10-CM

## 2019-07-02 DIAGNOSIS — G9331 Postviral fatigue syndrome: Secondary | ICD-10-CM

## 2019-07-02 DIAGNOSIS — K429 Umbilical hernia without obstruction or gangrene: Secondary | ICD-10-CM

## 2019-07-02 DIAGNOSIS — G933 Postviral fatigue syndrome: Secondary | ICD-10-CM

## 2019-07-02 DIAGNOSIS — F325 Major depressive disorder, single episode, in full remission: Secondary | ICD-10-CM | POA: Diagnosis not present

## 2019-07-02 DIAGNOSIS — E538 Deficiency of other specified B group vitamins: Secondary | ICD-10-CM

## 2019-07-02 DIAGNOSIS — Z8616 Personal history of COVID-19: Secondary | ICD-10-CM

## 2019-07-02 DIAGNOSIS — K0389 Other specified diseases of hard tissues of teeth: Secondary | ICD-10-CM

## 2019-07-02 MED ORDER — PREVIDENT 5000 BOOSTER PLUS 1.1 % DT PSTE: 1.0000 mg | PASTE | Freq: Every day | DENTAL | 5 refills | Status: DC

## 2019-07-02 NOTE — Telephone Encounter (Signed)
Patient's CPAP order has been re-faxed to [place].

## 2019-07-02 NOTE — Progress Notes (Signed)
Name: Sharon Herring   MRN: [id number]    DOB: 1975-11-17   Date:07/02/2019       Progress Note  Subjective  Chief Complaint  Chief Complaint  Patient presents with  . Follow-up    1 month F/U  . COVID-19  . Shortness of Breath    Follow up from Urgent Care 2 weeks ago    I connected with  [name]  on 07/02/19 at  2:00 PM EST by a video enabled telemedicine application and verified that I am speaking with the correct person using two identifiers.  I discussed the limitations of evaluation and management by telemedicine and the availability of in person appointments. The patient expressed understanding and agreed to proceed. Staff also discussed with the patient that there may be a patient responsible charge related to this service. Patient Location: at work  Provider Location: [facility]   HPI  Iron deficiency anemia: hgb dropped, had an iron infusion last Friday and will go again this Friday, she denies any pica, she has been tired but no SOB. She has uterine fibroids, cycles are monthly and last 7 days. She is going to have uterine fibroid removed by Dr. Marcelline Mates soon and hopefully it will correct her anemia  History of COVID-19: she was diagnosed in September, her symptoms of cough was very prolonged, she took antibiotics, prednisone and is still having some SOB and fatigue. She states albuterol helps with SOB and wheezing, she is able to work but takes a nap when she gets home, wakes up at 9 pm to eat and falls right back asleep.   Umbilical hernia: seen by Dr. Perrin Maltese and will have hernia repair soon, the umbilical hernia is reducible but tender to touch at times  Obesity: she wants to hold off on resuming medication, she states she lost a lot of weight with COVID-19 because of nausea , diarrhea and lack of appetite.    Patient Active Problem List   Diagnosis Date Noted  . Lymphadenopathy 01/16/2018  . Moderate obstructive sleep apnea 12/19/2017  .  Swelling of limb 01/27/2017  . B12 deficiency 03/28/2016  . Vitamin D deficiency 03/28/2016  . Hyperglycemia 03/28/2016  . Multiple gastric polyps 01/08/2016  . Menorrhagia 11/13/2015  . Chronic constipation 09/23/2015  . Obesity, Class I, BMI 30.0-34.9 (see actual BMI) 06/02/2015  . Iron deficiency anemia 03/14/2014  . History of uterine fibroid 03/14/2014    Past Surgical History:  Procedure Laterality Date  . APPENDECTOMY  2012  . ESOPHAGOGASTRODUODENOSCOPY (EGD) WITH PROPOFOL N/A 01/06/2016   Procedure: ESOPHAGOGASTRODUODENOSCOPY (EGD) WITH PROPOFOL;  Surgeon: Robert Bellow, MD;  Location: [facility] ENDOSCOPY;  Service: Endoscopy;  Laterality: N/A;  . ROBOTIC ASSISTED LAPAROSCOPIC CHOLECYSTECTOMY N/A 03/03/2016   Procedure: ROBOTIC ASSISTED LAPAROSCOPIC CHOLECYSTECTOMY ;  Surgeon: Clayburn Pert, MD;  Location: [facility] ORS;  Service: General;  Laterality: N/A;  . UTERINE FIBROID SURGERY  2012    Family History  Problem Relation Age of Onset  . Hypertension Mother   . Hypertension Father   . Pancreatic cancer Father 39  . Breast cancer Neg Hx   . Colon cancer Neg Hx     Social History   Socioeconomic History  . Marital status: Single    Spouse name: Not on file  . Number of children: 0  . Years of education: Not on file  . Highest education level: Bachelor's degree (e.g., BA, AB, BS)  Occupational History  . Occupation: CMA  Social Needs  .  Financial resource strain: Not hard at all  . Food insecurity    Worry: Never true    Inability: Never true  . Transportation needs    Medical: No    Non-medical: No  Tobacco Use  . Smoking status: Never Smoker  . Smokeless tobacco: Never Used  Substance and Sexual Activity  . Alcohol use: No    Alcohol/week: 0.0 standard drinks  . Drug use: No  . Sexual activity: Not Currently    Partners: Male    Birth control/protection: Condom  Lifestyle  . Physical activity    Days per week: 7 days    Minutes per session: 30 min  .  Stress: Not at all  Relationships  . Social connections    Talks on phone: More than three times a week    Gets together: More than three times a week    Attends religious service: More than 4 times per year    Active member of club or organization: Yes    Attends meetings of clubs or organizations: More than 4 times per year    Relationship status: Never married  . Intimate partner violence    Fear of current or ex partner: No    Emotionally abused: No    Physically abused: No    Forced sexual activity: No  Other Topics Concern  . Not on file  Social History Narrative   Lives alone, works for [employer] at the [facility] , but switching to [employer]      Current Outpatient Medications:  .  albuterol (PROVENTIL) (2.5 MG/3ML) 0.083% nebulizer solution, Take 3 mLs (2.5 mg total) by nebulization every 6 (six) hours as needed for wheezing or shortness of breath., Disp: 150 mL, Rfl: 1 .  albuterol (VENTOLIN HFA) 108 (90 Base) MCG/ACT inhaler, Inhale 2 puffs into the lungs every 6 (six) hours as needed for wheezing or shortness of breath., Disp: 18 g, Rfl: 0 .  fluticasone (FLONASE) 50 MCG/ACT nasal spray, Place 2 sprays into both nostrils daily., Disp: 16 g, Rfl: 6 .  fluticasone furoate-vilanterol (BREO ELLIPTA) 100-25 MCG/INH AEPB, Inhale 1 puff into the lungs daily., Disp: , Rfl:  .  furosemide (LASIX) 20 MG tablet, Take 1 tablet (20 mg total) by mouth daily. (Patient taking differently: Take 20 mg by mouth as needed for fluid. ), Disp: 30 tablet, Rfl: 1 .  ibuprofen (ADVIL,MOTRIN) 800 MG tablet, Take 1 tablet (800 mg total) by mouth every 8 (eight) hours as needed., Disp: 60 tablet, Rfl: 3 .  Iron-FA-B Cmp-C-Biot-Probiotic (FUSION PLUS) CAPS, Take 1 capsule by mouth daily., Disp: , Rfl:  .  linaclotide (LINZESS) 145 MCG CAPS capsule, Take 1 capsule (145 mcg total) by mouth daily before breakfast. (Patient taking differently: Take 145 mcg by mouth as needed. ), Disp: 30 capsule, Rfl: 2 .  Multiple  Vitamins-Minerals (MULTIVITAMIN ADULT) TABS, Take 1 tablet by mouth daily., Disp: 30 tablet, Rfl: 0 .  norethindrone (AYGESTIN) 5 MG tablet, Take 1 tablet (5 mg total) by mouth daily., Disp: 30 tablet, Rfl: 2 .  PREVIDENT 5000 BOOSTER PLUS 1.1 % PSTE, , Disp: , Rfl: 2 .  iron polysaccharides (NIFEREX) 150 MG capsule, Take 150 mg by mouth every other day., Disp: , Rfl:  .  ondansetron (ZOFRAN ODT) 4 MG disintegrating tablet, Take 1 tablet (4 mg total) by mouth every 8 (eight) hours as needed. (Patient not taking: Reported on 06/27/2019), Disp: 20 tablet, Rfl: 0  Allergies  Allergen Reactions  . Aspirin  Nausea And Vomiting  . Oxycodone-Acetaminophen Rash and Shortness Of Breath  . Lexapro [Escitalopram] Palpitations  . Tape Other (See Comments)    Burning feeling. Has left mark on skin.  Melynda Keller [Lorcaserin Hcl] Other (See Comments)    headache  . Contrave [Naltrexone-Bupropion Hcl Er]   . Dicyclomine Nausea And Vomiting  . Norethindrone Rash    I personally reviewed active problem list, medication list, allergies, family history, social history, health maintenance with the patient/caregiver today.   ROS  Ten systems reviewed and is negative except as mentioned in HPI   Objective  Virtual encounter, vitals not obtained.  There is no height or weight on file to calculate BMI.  Physical Exam  Awake, alert and oriented   PHQ2/9: Depression screen [facility] 2/9 07/02/2019 05/29/2019 05/16/2019 05/08/2019 01/22/2019  Decreased Interest 0 0 0 0 0  Down, Depressed, Hopeless 0 0 0 0 0  PHQ - 2 Score 0 0 0 0 0  Altered sleeping 0 0 0 0 0  Tired, decreased energy 0 0 0 0 2  Change in appetite 0 0 0 0 0  Feeling bad or failure about yourself  0 0 0 0 0  Trouble concentrating 0 0 0 0 0  Moving slowly or fidgety/restless 0 0 0 0 0  Suicidal thoughts 0 0 0 0 0  PHQ-9 Score 0 0 0 0 2  Difficult doing work/chores Not difficult at all Not difficult at all - Not difficult at all Not difficult at  all  Some recent data might be hidden   PHQ-2/9 Result is negative.    Fall Risk: Fall Risk  05/16/2019 05/08/2019 01/22/2019 10/17/2018 08/07/2018  Falls in the past year? 0 0 0 0 0  Number falls in past yr: 0 0 0 - 0  Injury with Fall? 0 0 0 - 0     Assessment & Plan  1. History of 2019 novel coronavirus disease (COVID-19)  Explained possible RAD from COVID and postviral fatigue   2. Postviral fatigue syndrome   3. B12 deficiency   4. Major depression in remission [facility])  Doing well  5. OSA (obstructive sleep apnea)  Needs to get CPAP machine, still did not get it   6. Umbilical hernia without obstruction and without gangrene  Going to have surgery for repair soon   7. Low HDL (under 40)   8. Iron deficiency anemia due to chronic blood loss  Continue iron infusion   9. Tooth sensitivity  - PREVIDENT 5000 BOOSTER PLUS 1.1 % PSTE; Place 1 mg onto teeth daily.  Dispense: 112 g; Refill: 5  I discussed the assessment and treatment plan with the patient. The patient was provided an opportunity to ask questions and all were answered. The patient agreed with the plan and demonstrated an understanding of the instructions.  The patient was advised to call back or seek an in-person evaluation if the symptoms worsen or if the condition fails to improve as anticipated.  I provided 25  minutes of non-face-to-face time during this encounter.

## 2019-07-03 ENCOUNTER — Ambulatory Visit (INDEPENDENT_AMBULATORY_CARE_PROVIDER_SITE_OTHER): Payer: No Typology Code available for payment source | Admitting: Surgery

## 2019-07-03 ENCOUNTER — Encounter: Payer: Self-pay | Admitting: Surgery

## 2019-07-03 ENCOUNTER — Other Ambulatory Visit: Payer: Self-pay

## 2019-07-03 ENCOUNTER — Telehealth: Payer: Self-pay | Admitting: Family Medicine

## 2019-07-03 DIAGNOSIS — K432 Incisional hernia without obstruction or gangrene: Secondary | ICD-10-CM | POA: Diagnosis not present

## 2019-07-03 NOTE — Telephone Encounter (Signed)
Cecille Rubin with [facility] called in to make provider aware that they are not in network with pt's insurance for C-Pap machine.   They would like to be advised further on how to proceed?   CB: [phone]

## 2019-07-03 NOTE — Telephone Encounter (Signed)
Lori calling from [place] .  They did not get any demographic info with this order.  Need copy of insurance card as well.  Fax  [phone]  cb [phone]

## 2019-07-03 NOTE — Telephone Encounter (Signed)
The information has been faxed as requested.

## 2019-07-03 NOTE — H&P (View-Only) (Signed)
Outpatient Surgical Follow Up  07/03/2019  Sharon Herring is an 43 y.o. female.   Chief Complaint  Patient presents with  . Follow-up    ventral hernia update h&p    HPI: 43 year old female well-known to me with history of ventral hernia that is symptomatic.  She did have a prior history of appendectomy and cholecystectomy in the past.  She initially was scheduled for a combined ventral hernia repair and myomectomy robotically but she developed Covid and surgery was postponed.  She now has recover from Covid and denies any respiratory symptoms.  She is back to baseline.  No fevers no chills no shortness of breath she is able to perform more than 4 METS of activity without any shortness of breath or chest pain.   She now endorses more abdominal pain and she definitely is symptomatic from the ventral hernia.  She does have some anemia and is getting some iron infusions. I had a an extensive discussion with her regarding the 2 methods of combining the ventral hernia versus doing a two-stage operation.  Currently wishes to undergo hernia surgery first and to have that scheduled soon as possible since it is quite symptomatic  Past Medical History:  Diagnosis Date  . Anemia   . Dizziness   . Fibroid uterus   . GERD (gastroesophageal reflux disease)   . IBS (irritable bowel syndrome)   . Migraine   . Polyp of stomach 01/06/2016   Stomach polyp, pyloric, consistent with prolapse.     Past Surgical History:  Procedure Laterality Date  . APPENDECTOMY  2012  . ESOPHAGOGASTRODUODENOSCOPY (EGD) WITH PROPOFOL N/A 01/06/2016   Procedure: ESOPHAGOGASTRODUODENOSCOPY (EGD) WITH PROPOFOL;  Surgeon: Robert Bellow, MD;  Location: [facility] ENDOSCOPY;  Service: Endoscopy;  Laterality: N/A;  . ROBOTIC ASSISTED LAPAROSCOPIC CHOLECYSTECTOMY N/A 03/03/2016   Procedure: ROBOTIC ASSISTED LAPAROSCOPIC CHOLECYSTECTOMY ;  Surgeon: Clayburn Pert, MD;  Location: [facility] ORS;  Service: General;  Laterality: N/A;  .  UTERINE FIBROID SURGERY  2012    Family History  Problem Relation Age of Onset  . Hypertension Mother   . Hypertension Father   . Pancreatic cancer Father 59  . Breast cancer Neg Hx   . Colon cancer Neg Hx     Social History:  reports that she has never smoked. She has never used smokeless tobacco. She reports that she does not drink alcohol or use drugs.  Allergies:  Allergies  Allergen Reactions  . Aspirin Nausea And Vomiting  . Oxycodone-Acetaminophen Rash and Shortness Of Breath  . Lexapro [Escitalopram] Palpitations  . Tape Other (See Comments)    Burning feeling. Has left mark on skin.  Melynda Keller [Lorcaserin Hcl] Other (See Comments)    headache  . Contrave [Naltrexone-Bupropion Hcl Er]   . Dicyclomine Nausea And Vomiting  . Norethindrone Rash    Medications reviewed.    ROS Full ROS performed and is otherwise negative other than what is stated in HPI   LMP 06/08/2019   Physical Exam Vitals signs and nursing note reviewed. Exam conducted with a chaperone present.  Constitutional:      General: She is not in acute distress.    Appearance: Normal appearance. She is normal weight.  Eyes:     General: No scleral icterus.       Right eye: No discharge.        Left eye: No discharge.  Neck:     Musculoskeletal: Normal range of motion. No neck rigidity.  Cardiovascular:  Rate and Rhythm: Normal rate and regular rhythm.     Heart sounds: No murmur.  Pulmonary:     Effort: Pulmonary effort is normal. No respiratory distress.     Breath sounds: No stridor. No wheezing or rhonchi.  Abdominal:     General: Abdomen is flat. There is no distension.     Palpations: Abdomen is soft.     Tenderness: There is no right CVA tenderness or guarding.     Hernia: A hernia is present.     Comments: Periumbilical ventral hernia approximately 3 to 4 cm in size mildly tender to palpation without peritonitis  Musculoskeletal: Normal range of motion.  Skin:    General: Skin  is warm and dry.     Capillary Refill: Capillary refill takes less than 2 seconds.  Neurological:     General: No focal deficit present.     Mental Status: She is alert and oriented to person, place, and time.  Psychiatric:        Mood and Affect: Mood normal.        Behavior: Behavior normal.        Thought Content: Thought content normal.        Judgment: Judgment normal.     Assessment/Plan: 43 year old female with symptomatic ventral hernia.  Discussed with the patient in detail about options regarding surgical therapy.  I do definitely recommend repair and I do think a robotic approach is well suited for her.  She does have a history of fibroid tumors.  I had an extensive discussion with patient regarding the advantages and disadvantages of combine cases with GYN.  Given that there is going to be some GYN pathology and manipulation I will probably put Phisix ST which is an absorbable mesh.  If she wishes to do a two-stage operation I will likely place polypropylene mesh that has better long-term durability and decreases the chances of recurrence.  After lengthy discussion with the patient she wishes to undergo purely ventral hernia repair robotically and then at some other time have her fibroid taken out in a different setting.  Procedure discussed with patient detail.  Risk benefit and possible complications including but not limited to: Bleeding, infection, chronic pain, mesh issues and bowel injuries.  She understands and wishes to proceed  Greater than 50% of the 25 minutes  visit was spent in counseling/coordination of care   Caroleen Hamman, MD [place] Surgeon

## 2019-07-03 NOTE — Patient Instructions (Addendum)
Surgery scheduled for 07/16/2019.   Ventral Hernia  A ventral hernia is a bulge of tissue from inside the abdomen that pushes through a weak area of the muscles that form the front wall of the abdomen. The tissues inside the abdomen are inside a sac (peritoneum). These tissues include the small intestine, large intestine, and the fatty tissue that covers the intestines (omentum). Sometimes, the bulge that forms a hernia contains intestines. Other hernias contain only fat. Ventral hernias do not go away without surgical treatment. There are several types of ventral hernias. You may have:  A hernia at an incision site from previous abdominal surgery (incisional hernia).  A hernia just above the belly button (epigastric hernia), or at the belly button (umbilical hernia). These types of hernias can develop from heavy lifting or straining.  A hernia that comes and goes (reducible hernia). It may be visible only when you lift or strain. This type of hernia can be pushed back into the abdomen (reduced).  A hernia that traps abdominal tissue inside the hernia (incarcerated hernia). This type of hernia does not reduce.  A hernia that cuts off blood flow to the tissues inside the hernia (strangulated hernia). The tissues can start to die if this happens. This is a very painful bulge that cannot be reduced. A strangulated hernia is a medical emergency. What are the causes? This condition is caused by abdominal tissue putting pressure on an area of weakness in the abdominal muscles. What increases the risk? The following factors may make you more likely to develop this condition:  Being female.  Being 34 or older.  Being overweight or obese.  Having had previous abdominal surgery, especially if there was an infection after surgery.  Having had an injury to the abdominal wall.  Having had several pregnancies.  Having a buildup of fluid inside the abdomen (ascites). What are the signs or symptoms?  The only symptom of a ventral hernia may be a painless bulge in the abdomen. A reducible hernia may be visible only when you strain, cough, or lift. Other symptoms may include:  Dull pain.  A feeling of pressure. Signs and symptoms of a strangulated hernia may include:  Increasing pain.  Nausea and vomiting.  Pain when pressing on the hernia.  The skin over the hernia turning red or purple.  Constipation.  Blood in the stool (feces). How is this diagnosed? This condition may be diagnosed based on:  Your symptoms.  Your medical history.  A physical exam. You may be asked to cough or strain while standing. These actions increase the pressure inside your abdomen and force the hernia through the opening in your muscles. Your health care provider may try to reduce the hernia by pressing on it.  Imaging studies, such as an ultrasound or CT scan. How is this treated? This condition is treated with surgery. If you have a strangulated hernia, surgery is done as soon as possible. If your hernia is small and not incarcerated, you may be asked to lose some weight before surgery. Follow these instructions at home:  Follow instructions from your health care provider about eating or drinking restrictions.  If you are overweight, your health care provider may recommend that you increase your activity level and eat a healthier diet.  Do not lift anything that is heavier than 10 lb (4.5 kg).  Return to your normal activities as told by your health care provider. Ask your health care provider what activities are safe for you.  You may need to avoid activities that increase pressure on your hernia.  Take over-the-counter and prescription medicines only as told by your health care provider.  Keep all follow-up visits as told by your health care provider. This is important. Contact a health care provider if:  Your hernia gets larger.  Your hernia becomes painful. Get help right away if:   Your hernia becomes increasingly painful.  You have pain along with any of the following: ? Changes in skin color in the area of the hernia. ? Nausea. ? Vomiting. ? Fever. Summary  A ventral hernia is a bulge of tissue from inside the abdomen that pushes through a weak area of the muscles that form the front wall of the abdomen.  This condition is treated with surgery, which may be urgent depending on your hernia.  Do not lift anything that is heavier than 10 lb (4.5 kg), and follow activity instructions from your health care provider. This information is not intended to replace advice given to you by your health care provider. Make sure you discuss any questions you have with your health care provider. Document Released: 07/25/2012 Document Revised: 09/20/2017 Document Reviewed: 02/27/2017 Elsevier Patient Education  2020 [employer].

## 2019-07-03 NOTE — Addendum Note (Signed)
Addended by: Caroleen Hamman F on: 07/03/2019 01:08 PM   Modules accepted: Orders, SmartSet

## 2019-07-03 NOTE — Progress Notes (Signed)
Outpatient Surgical Follow Up  07/03/2019  Sharon Herring is an 43 y.o. female.   Chief Complaint  Patient presents with  . Follow-up    ventral hernia update h&p    HPI: 42 year old female well-known to me with history of ventral hernia that is symptomatic.  She did have a prior history of appendectomy and cholecystectomy in the past.  She initially was scheduled for a combined ventral hernia repair and myomectomy robotically but she developed Covid and surgery was postponed.  She now has recover from Covid and denies any respiratory symptoms.  She is back to baseline.  No fevers no chills no shortness of breath she is able to perform more than 4 METS of activity without any shortness of breath or chest pain.   She now endorses more abdominal pain and she definitely is symptomatic from the ventral hernia.  She does have some anemia and is getting some iron infusions. I had a an extensive discussion with her regarding the 2 methods of combining the ventral hernia versus doing a two-stage operation.  Currently wishes to undergo hernia surgery first and to have that scheduled soon as possible since it is quite symptomatic  Past Medical History:  Diagnosis Date  . Anemia   . Dizziness   . Fibroid uterus   . GERD (gastroesophageal reflux disease)   . IBS (irritable bowel syndrome)   . Migraine   . Polyp of stomach 01/06/2016   Stomach polyp, pyloric, consistent with prolapse.     Past Surgical History:  Procedure Laterality Date  . APPENDECTOMY  2012  . ESOPHAGOGASTRODUODENOSCOPY (EGD) WITH PROPOFOL N/A 01/06/2016   Procedure: ESOPHAGOGASTRODUODENOSCOPY (EGD) WITH PROPOFOL;  Surgeon: Robert Bellow, MD;  Location: [facility] ENDOSCOPY;  Service: Endoscopy;  Laterality: N/A;  . ROBOTIC ASSISTED LAPAROSCOPIC CHOLECYSTECTOMY N/A 03/03/2016   Procedure: ROBOTIC ASSISTED LAPAROSCOPIC CHOLECYSTECTOMY ;  Surgeon: Clayburn Pert, MD;  Location: [facility] ORS;  Service: General;  Laterality: N/A;  .  UTERINE FIBROID SURGERY  2012    Family History  Problem Relation Age of Onset  . Hypertension Mother   . Hypertension Father   . Pancreatic cancer Father 31  . Breast cancer Neg Hx   . Colon cancer Neg Hx     Social History:  reports that she has never smoked. She has never used smokeless tobacco. She reports that she does not drink alcohol or use drugs.  Allergies:  Allergies  Allergen Reactions  . Aspirin Nausea And Vomiting  . Oxycodone-Acetaminophen Rash and Shortness Of Breath  . Lexapro [Escitalopram] Palpitations  . Tape Other (See Comments)    Burning feeling. Has left mark on skin.  Melynda Keller [Lorcaserin Hcl] Other (See Comments)    headache  . Contrave [Naltrexone-Bupropion Hcl Er]   . Dicyclomine Nausea And Vomiting  . Norethindrone Rash    Medications reviewed.    ROS Full ROS performed and is otherwise negative other than what is stated in HPI   LMP 06/08/2019   Physical Exam Vitals signs and nursing note reviewed. Exam conducted with a chaperone present.  Constitutional:      General: She is not in acute distress.    Appearance: Normal appearance. She is normal weight.  Eyes:     General: No scleral icterus.       Right eye: No discharge.        Left eye: No discharge.  Neck:     Musculoskeletal: Normal range of motion. No neck rigidity.  Cardiovascular:  Rate and Rhythm: Normal rate and regular rhythm.     Heart sounds: No murmur.  Pulmonary:     Effort: Pulmonary effort is normal. No respiratory distress.     Breath sounds: No stridor. No wheezing or rhonchi.  Abdominal:     General: Abdomen is flat. There is no distension.     Palpations: Abdomen is soft.     Tenderness: There is no right CVA tenderness or guarding.     Hernia: A hernia is present.     Comments: Periumbilical ventral hernia approximately 3 to 4 cm in size mildly tender to palpation without peritonitis  Musculoskeletal: Normal range of motion.  Skin:    General: Skin  is warm and dry.     Capillary Refill: Capillary refill takes less than 2 seconds.  Neurological:     General: No focal deficit present.     Mental Status: She is alert and oriented to person, place, and time.  Psychiatric:        Mood and Affect: Mood normal.        Behavior: Behavior normal.        Thought Content: Thought content normal.        Judgment: Judgment normal.     Assessment/Plan: 43 year old female with symptomatic ventral hernia.  Discussed with the patient in detail about options regarding surgical therapy.  I do definitely recommend repair and I do think a robotic approach is well suited for her.  She does have a history of fibroid tumors.  I had an extensive discussion with patient regarding the advantages and disadvantages of combine cases with GYN.  Given that there is going to be some GYN pathology and manipulation I will probably put Phisix ST which is an absorbable mesh.  If she wishes to do a two-stage operation I will likely place polypropylene mesh that has better long-term durability and decreases the chances of recurrence.  After lengthy discussion with the patient she wishes to undergo purely ventral hernia repair robotically and then at some other time have her fibroid taken out in a different setting.  Procedure discussed with patient detail.  Risk benefit and possible complications including but not limited to: Bleeding, infection, chronic pain, mesh issues and bowel injuries.  She understands and wishes to proceed  Greater than 50% of the 25 minutes  visit was spent in counseling/coordination of care   Caroleen Hamman, MD [facility]

## 2019-07-04 ENCOUNTER — Telehealth: Payer: Self-pay | Admitting: Surgery

## 2019-07-04 NOTE — Telephone Encounter (Signed)
Since there was no direct instruction of how to proceed with this, I guess I will contact [employer] and tell them to disregard then I will inform the patient and see if she could contact her health insurance to find out who can we send the order to.

## 2019-07-04 NOTE — Telephone Encounter (Signed)
Pt has been advised of pre admission date/time, Covid Testing date and Surgery date.  Surgery Date: 07/15/19 with Dr Kris Mouton assisted ventral hernia repair.  Preadmission Testing Date: 07/10/19 between 8-1:00pm-phone interview.  Covid Testing Date: 07/11/19 between 8-10:30am - patient advised to go to the [facility]  [employer] Video sent via [employer] [facility] and [employer].  Patient has been made aware to call [phone], between 1-3:00pm the day before surgery, to find out what time to arrive.

## 2019-07-04 NOTE — Telephone Encounter (Signed)
I tried to contact Cecille Rubin with [employer] to inform her to just disregard that order since it is not covered but there was no answer.  A message was left for her stating that I letting her know that we will be contacting the patient to inform her that she has to reach out to her insurance to find out where we can send the order for her CPAP.

## 2019-07-05 ENCOUNTER — Inpatient Hospital Stay: Payer: No Typology Code available for payment source

## 2019-07-10 ENCOUNTER — Other Ambulatory Visit: Admission: RE | Admit: 2019-07-10 | Payer: No Typology Code available for payment source | Source: Ambulatory Visit

## 2019-07-11 ENCOUNTER — Encounter
Admission: RE | Admit: 2019-07-11 | Discharge: 2019-07-11 | Disposition: A | Discharge status: Home / Self Care | Payer: No Typology Code available for payment source | Source: Ambulatory Visit | Attending: Surgery | Admitting: Surgery

## 2019-07-11 ENCOUNTER — Other Ambulatory Visit: Payer: Self-pay

## 2019-07-11 NOTE — Patient Instructions (Signed)
Your procedure is scheduled on: July 15, 2019 [place] [facility] to Day Surgery on the 2nd floor of the [facility]. To find out your arrival time, please call [phone] between 1PM - 3PM on: Friday July 12, 2019  REMEMBER: Instructions that are not followed completely may result in serious medical risk, up to and including death; or upon the discretion of your surgeon and anesthesiologist your surgery may need to be rescheduled.  Do not eat food after midnight the night before surgery.  No gum chewing, lozengers or hard candies.  You may however, drink CLEAR liquids up to 2 hours before you are scheduled to arrive for your surgery. Do not drink anything within 2 hours of the start of your surgery.  Clear liquids include: - water  - apple juice without pulp - clear gatorade - black coffee or tea (Do NOT add milk or creamers to the coffee or tea) Do NOT drink anything that is not on this list.     No Alcohol for 24 hours before or after surgery.  No Smoking including e-cigarettes for 24 hours prior to surgery.  No chewable tobacco products for at least 6 hours prior to surgery.  No nicotine patches on the day of surgery.  On the morning of surgery brush your teeth with toothpaste and water, you may rinse your mouth with mouthwash if you wish. Do not swallow any toothpaste or mouthwash.  Notify your doctor if there is any change in your medical condition (cold, fever, infection).  Do not wear jewelry, make-up, hairpins, clips or nail polish.  Do not wear lotions, powders, or perfumes OR DEODORANT.   Do not shave 48 hours prior to surgery.   Contacts and dentures may not be worn into surgery.  Do not bring valuables to the hospital, including drivers license, insurance or credit cards.  [facility] is not responsible for any belongings or valuables.   TAKE THESE MEDICATIONS THE MORNING OF SURGERY: none  Use CHG Soap or wipes as directed on instruction  sheet.  Use inhalers an nebulizer on the day of surgery and bring to the hospital.  Stop Anti-inflammatories (NSAIDS) such as Advil, Aleve, Ibuprofen, Motrin, Naproxen, Naprosyn and Aspirin based products such as Excedrin, Goodys Powder, BC Powder. (May take Tylenol or Acetaminophen if needed.)  Stop ANY OVER THE COUNTER supplements until after surgery. (May continue Vitamin D, Vitamin B, and multivitamin.)  Wear comfortable clothing (specific to your surgery type) to the hospital.  Plan for stool softeners for home use.  If you are being admitted to the hospital overnight, leave your suitcase in the car. After surgery it may be brought to your room.  If you are being discharged the day of surgery, you will not be allowed to drive home. You will need a responsible adult to drive you home and stay with you that night.   If you are taking public transportation, you will need to have a responsible adult with you. Please confirm with your physician that it is acceptable to use public transportation.   Please call [phone] if you have any questions about these instructions.

## 2019-07-11 NOTE — Pre-Procedure Instructions (Signed)
Reviewed instructions with patient over the phone. Copy of instructions and chg soap and instructions will be picked up by patient today.

## 2019-07-14 ENCOUNTER — Encounter: Payer: Self-pay | Admitting: Anesthesiology

## 2019-07-15 ENCOUNTER — Encounter
Admission: RE | Disposition: A | Discharge status: Home / Self Care | Payer: Self-pay | Source: Ambulatory Visit | Attending: Surgery

## 2019-07-15 ENCOUNTER — Ambulatory Visit: Payer: No Typology Code available for payment source | Admitting: Anesthesiology

## 2019-07-15 ENCOUNTER — Other Ambulatory Visit: Payer: Self-pay

## 2019-07-15 ENCOUNTER — Ambulatory Visit
Admission: RE | Admit: 2019-07-15 | Discharge: 2019-07-15 | Disposition: A | Discharge status: Home / Self Care | Payer: No Typology Code available for payment source | Source: Ambulatory Visit | Attending: Surgery | Admitting: Surgery

## 2019-07-15 DIAGNOSIS — G4730 Sleep apnea, unspecified: Secondary | ICD-10-CM | POA: Insufficient documentation

## 2019-07-15 DIAGNOSIS — D649 Anemia, unspecified: Secondary | ICD-10-CM | POA: Insufficient documentation

## 2019-07-15 DIAGNOSIS — K439 Ventral hernia without obstruction or gangrene: Secondary | ICD-10-CM | POA: Insufficient documentation

## 2019-07-15 DIAGNOSIS — K432 Incisional hernia without obstruction or gangrene: Secondary | ICD-10-CM

## 2019-07-15 HISTORY — PX: XI ROBOTIC ASSISTED VENTRAL HERNIA: SHX6789

## 2019-07-15 LAB — POCT PREGNANCY, URINE: Preg Test, Ur: NEGATIVE

## 2019-07-15 SURGERY — REPAIR, HERNIA, VENTRAL, ROBOT-ASSISTED
Anesthesia: General

## 2019-07-15 MED ORDER — HYDROCODONE-ACETAMINOPHEN 5-325 MG PO TABS: 1.0000 | ORAL_TABLET | Freq: Four times a day (QID) | ORAL | 0 refills | Status: DC | PRN

## 2019-07-15 MED ORDER — DEXAMETHASONE SODIUM PHOSPHATE 10 MG/ML IJ SOLN
INTRAMUSCULAR | Status: AC
  Filled 2019-07-15: qty 1

## 2019-07-15 MED ORDER — SUGAMMADEX SODIUM 200 MG/2ML IV SOLN
INTRAVENOUS | Status: DC | PRN
  Administered 2019-07-15: 200 mg via INTRAVENOUS

## 2019-07-15 MED ORDER — MIDAZOLAM HCL 2 MG/2ML IJ SOLN
INTRAMUSCULAR | Status: AC
  Filled 2019-07-15: qty 2

## 2019-07-15 MED ORDER — LIDOCAINE HCL (PF) 2 % IJ SOLN
INTRAMUSCULAR | Status: AC
  Filled 2019-07-15: qty 10

## 2019-07-15 MED ORDER — FAMOTIDINE 20 MG PO TABS
20.0000 mg | ORAL_TABLET | Freq: Once | ORAL | Status: AC
  Administered 2019-07-15: 07:00:00 20 mg via ORAL

## 2019-07-15 MED ORDER — ONDANSETRON HCL 4 MG/2ML IJ SOLN: 4.0000 mg | Freq: Once | INTRAMUSCULAR | Status: DC | PRN

## 2019-07-15 MED ORDER — BUPIVACAINE-EPINEPHRINE (PF) 0.25% -1:200000 IJ SOLN
INTRAMUSCULAR | Status: AC
  Filled 2019-07-15: qty 30

## 2019-07-15 MED ORDER — SUCCINYLCHOLINE CHLORIDE 20 MG/ML IJ SOLN
INTRAMUSCULAR | Status: AC
  Filled 2019-07-15: qty 1

## 2019-07-15 MED ORDER — CHLORHEXIDINE GLUCONATE CLOTH 2 % EX PADS
6.0000 | MEDICATED_PAD | Freq: Once | CUTANEOUS | Status: AC
  Administered 2019-07-15: 6 via TOPICAL

## 2019-07-15 MED ORDER — BUPIVACAINE LIPOSOME 1.3 % IJ SUSP
INTRAMUSCULAR | Status: DC | PRN
  Administered 2019-07-15: 20 mL

## 2019-07-15 MED ORDER — ONDANSETRON HCL 4 MG/2ML IJ SOLN
INTRAMUSCULAR | Status: DC | PRN
  Administered 2019-07-15: 4 mg via INTRAVENOUS

## 2019-07-15 MED ORDER — CEFAZOLIN SODIUM-DEXTROSE 2-4 GM/100ML-% IV SOLN
INTRAVENOUS | Status: AC
  Filled 2019-07-15: qty 100

## 2019-07-15 MED ORDER — ROCURONIUM BROMIDE 100 MG/10ML IV SOLN
INTRAVENOUS | Status: DC | PRN
  Administered 2019-07-15: 10 mg via INTRAVENOUS
  Administered 2019-07-15 (×2): 20 mg via INTRAVENOUS

## 2019-07-15 MED ORDER — PROPOFOL 10 MG/ML IV BOLUS
INTRAVENOUS | Status: AC
  Filled 2019-07-15: qty 20

## 2019-07-15 MED ORDER — PHENYLEPHRINE HCL (PRESSORS) 10 MG/ML IV SOLN
INTRAVENOUS | Status: AC
  Filled 2019-07-15: qty 1

## 2019-07-15 MED ORDER — FENTANYL CITRATE (PF) 100 MCG/2ML IJ SOLN
INTRAMUSCULAR | Status: AC
  Filled 2019-07-15: qty 2

## 2019-07-15 MED ORDER — SUCCINYLCHOLINE CHLORIDE 20 MG/ML IJ SOLN
INTRAMUSCULAR | Status: DC | PRN
  Administered 2019-07-15: 100 mg via INTRAVENOUS

## 2019-07-15 MED ORDER — DEXAMETHASONE SODIUM PHOSPHATE 10 MG/ML IJ SOLN
INTRAMUSCULAR | Status: DC | PRN
  Administered 2019-07-15: 10 mg via INTRAVENOUS

## 2019-07-15 MED ORDER — IBUPROFEN 800 MG PO TABS
ORAL_TABLET | ORAL | Status: AC
  Filled 2019-07-15: qty 1

## 2019-07-15 MED ORDER — SUGAMMADEX SODIUM 200 MG/2ML IV SOLN
INTRAVENOUS | Status: AC
  Filled 2019-07-15: qty 2

## 2019-07-15 MED ORDER — FENTANYL CITRATE (PF) 100 MCG/2ML IJ SOLN
25.0000 ug | INTRAMUSCULAR | Status: DC | PRN
  Administered 2019-07-15 (×2): 25 ug via INTRAVENOUS

## 2019-07-15 MED ORDER — IBUPROFEN 800 MG PO TABS
800.0000 mg | ORAL_TABLET | Freq: Three times a day (TID) | ORAL | Status: DC | PRN
  Administered 2019-07-15: 800 mg via ORAL
  Filled 2019-07-15: qty 1

## 2019-07-15 MED ORDER — MIDAZOLAM HCL 2 MG/2ML IJ SOLN: INTRAMUSCULAR | Status: DC | PRN

## 2019-07-15 MED ORDER — HYDROCODONE-ACETAMINOPHEN 5-325 MG PO TABS
1.0000 | ORAL_TABLET | Freq: Once | ORAL | Status: AC | PRN
  Administered 2019-07-15: 11:00:00 1 via ORAL

## 2019-07-15 MED ORDER — ACETAMINOPHEN 500 MG PO TABS
ORAL_TABLET | ORAL | Status: AC
  Administered 2019-07-15: 1000 mg via ORAL
  Filled 2019-07-15: qty 2

## 2019-07-15 MED ORDER — FENTANYL CITRATE (PF) 100 MCG/2ML IJ SOLN
INTRAMUSCULAR | Status: DC | PRN
  Administered 2019-07-15 (×2): 50 ug via INTRAVENOUS

## 2019-07-15 MED ORDER — LACTATED RINGERS IV SOLN
INTRAVENOUS | Status: DC
  Administered 2019-07-15 (×2): via INTRAVENOUS

## 2019-07-15 MED ORDER — HYDROCODONE-ACETAMINOPHEN 5-325 MG PO TABS
ORAL_TABLET | ORAL | Status: AC
  Administered 2019-07-15: 1 via ORAL
  Filled 2019-07-15: qty 1

## 2019-07-15 MED ORDER — IBUPROFEN 800 MG PO TABS: 800.0000 mg | ORAL_TABLET | Freq: Three times a day (TID) | ORAL | 0 refills | Status: DC | PRN

## 2019-07-15 MED ORDER — ACETAMINOPHEN 500 MG PO TABS
1000.0000 mg | ORAL_TABLET | ORAL | Status: AC
  Administered 2019-07-15: 07:00:00 1000 mg via ORAL

## 2019-07-15 MED ORDER — PROPOFOL 10 MG/ML IV BOLUS
INTRAVENOUS | Status: DC | PRN
  Administered 2019-07-15: 140 mg via INTRAVENOUS

## 2019-07-15 MED ORDER — GABAPENTIN 300 MG PO CAPS
300.0000 mg | ORAL_CAPSULE | ORAL | Status: AC
  Administered 2019-07-15: 07:00:00 300 mg via ORAL

## 2019-07-15 MED ORDER — BUPIVACAINE-EPINEPHRINE 0.25% -1:200000 IJ SOLN
INTRAMUSCULAR | Status: DC | PRN
  Administered 2019-07-15: 30 mL

## 2019-07-15 MED ORDER — GABAPENTIN 300 MG PO CAPS
ORAL_CAPSULE | ORAL | Status: AC
  Administered 2019-07-15: 300 mg via ORAL
  Filled 2019-07-15: qty 1

## 2019-07-15 MED ORDER — PHENYLEPHRINE HCL (PRESSORS) 10 MG/ML IV SOLN
INTRAVENOUS | Status: DC | PRN
  Administered 2019-07-15 (×3): 100 ug via INTRAVENOUS

## 2019-07-15 MED ORDER — MIDAZOLAM HCL 2 MG/2ML IJ SOLN
INTRAMUSCULAR | Status: DC | PRN
  Administered 2019-07-15: 2 mg via INTRAVENOUS

## 2019-07-15 MED ORDER — FENTANYL CITRATE (PF) 100 MCG/2ML IJ SOLN
INTRAMUSCULAR | Status: AC
  Administered 2019-07-15: 25 ug via INTRAVENOUS
  Filled 2019-07-15: qty 2

## 2019-07-15 MED ORDER — DEXMEDETOMIDINE HCL 200 MCG/2ML IV SOLN
INTRAVENOUS | Status: DC | PRN
  Administered 2019-07-15 (×4): 4 ug via INTRAVENOUS

## 2019-07-15 MED ORDER — BUPIVACAINE LIPOSOME 1.3 % IJ SUSP
INTRAMUSCULAR | Status: AC
  Filled 2019-07-15: qty 20

## 2019-07-15 MED ORDER — LIDOCAINE HCL (CARDIAC) PF 100 MG/5ML IV SOSY
PREFILLED_SYRINGE | INTRAVENOUS | Status: DC | PRN
  Administered 2019-07-15: 100 mg via INTRAVENOUS

## 2019-07-15 MED ORDER — ROCURONIUM BROMIDE 50 MG/5ML IV SOLN
INTRAVENOUS | Status: AC
  Filled 2019-07-15: qty 1

## 2019-07-15 MED ORDER — ONDANSETRON HCL 4 MG/2ML IJ SOLN
INTRAMUSCULAR | Status: AC
  Filled 2019-07-15: qty 2

## 2019-07-15 MED ORDER — CEFAZOLIN SODIUM-DEXTROSE 2-4 GM/100ML-% IV SOLN
2.0000 g | INTRAVENOUS | Status: AC
  Administered 2019-07-15: 2 g via INTRAVENOUS

## 2019-07-15 MED ORDER — FAMOTIDINE 20 MG PO TABS
ORAL_TABLET | ORAL | Status: AC
  Administered 2019-07-15: 20 mg via ORAL
  Filled 2019-07-15: qty 1

## 2019-07-15 SURGICAL SUPPLY — 47 items
BLADE SURG SZ11 CARB STEEL (BLADE) ×2 IMPLANT
CANISTER SUCT 1200ML W/VALVE (MISCELLANEOUS) ×2 IMPLANT
CHLORAPREP W/TINT 26 (MISCELLANEOUS) ×2 IMPLANT
COVER TIP SHEARS 8 DVNC (MISCELLANEOUS) ×1 IMPLANT
COVER TIP SHEARS 8MM DA VINCI (MISCELLANEOUS) ×1
COVER WAND RF STERILE (DRAPES) ×2 IMPLANT
DEFOGGER SCOPE WARMER CLEARIFY (MISCELLANEOUS) ×2 IMPLANT
DERMABOND ADVANCED (GAUZE/BANDAGES/DRESSINGS) ×1
DERMABOND ADVANCED .7 DNX12 (GAUZE/BANDAGES/DRESSINGS) ×1 IMPLANT
DRAPE 3/4 80X56 (DRAPES) ×2 IMPLANT
DRAPE ARM DVNC X/XI (DISPOSABLE) ×4 IMPLANT
DRAPE COLUMN DVNC XI (DISPOSABLE) ×1 IMPLANT
DRAPE DA VINCI XI ARM (DISPOSABLE) ×4
DRAPE DA VINCI XI COLUMN (DISPOSABLE) ×1
ELECT CAUTERY BLADE 6.4 (BLADE) ×2 IMPLANT
ELECT REM PT RETURN 9FT ADLT (ELECTROSURGICAL) ×2
ELECTRODE REM PT RTRN 9FT ADLT (ELECTROSURGICAL) ×1 IMPLANT
GLOVE BIO SURGEON STRL SZ7 (GLOVE) ×4 IMPLANT
GOWN STRL REUS W/ TWL LRG LVL3 (GOWN DISPOSABLE) ×3 IMPLANT
GOWN STRL REUS W/TWL LRG LVL3 (GOWN DISPOSABLE) ×3
GRASPER SUT TROCAR 14GX15 (MISCELLANEOUS) ×2 IMPLANT
IRRIGATION STRYKERFLOW (MISCELLANEOUS) IMPLANT
IRRIGATOR STRYKERFLOW (MISCELLANEOUS)
IV NS 1000ML (IV SOLUTION)
IV NS 1000ML BAXH (IV SOLUTION) IMPLANT
KIT PINK PAD W/HEAD ARE REST (MISCELLANEOUS) ×2
KIT PINK PAD W/HEAD ARM REST (MISCELLANEOUS) ×1 IMPLANT
LABEL OR SOLS (LABEL) ×2 IMPLANT
MESH VENT LT ST 11.4CM CRL (Mesh General) ×1 IMPLANT
NEEDLE HYPO 22GX1.5 SAFETY (NEEDLE) ×2 IMPLANT
OBTURATOR OPTICAL STANDARD 8MM (TROCAR) ×1
OBTURATOR OPTICAL STND 8 DVNC (TROCAR) ×1
OBTURATOR OPTICALSTD 8 DVNC (TROCAR) ×1 IMPLANT
PACK LAP CHOLECYSTECTOMY (MISCELLANEOUS) ×2 IMPLANT
PENCIL ELECTRO HAND CTR (MISCELLANEOUS) ×2 IMPLANT
SEAL CANN UNIV 5-8 DVNC XI (MISCELLANEOUS) ×3 IMPLANT
SEAL XI 5MM-8MM UNIVERSAL (MISCELLANEOUS) ×3
SOLUTION ELECTROLUBE (MISCELLANEOUS) ×2 IMPLANT
SPONGE LAP 18X18 RF (DISPOSABLE) ×2 IMPLANT
SUT MNCRL 4-0 (SUTURE) ×1
SUT MNCRL 4-0 27XMFL (SUTURE) ×1
SUT STRATAFIX SPIRAL PDS+ 0 30 (SUTURE) ×2 IMPLANT
SUT VICRYL 0 AB UR-6 (SUTURE) ×4 IMPLANT
SUT VLOC 90 2/L VL 12 GS22 (SUTURE) ×4 IMPLANT
SUTURE MNCRL 4-0 27XMF (SUTURE) ×1 IMPLANT
TROCAR 130MM GELPORT  DAV (MISCELLANEOUS) ×2 IMPLANT
TUBING EVAC SMOKE HEATED PNEUM (TUBING) ×2 IMPLANT

## 2019-07-15 NOTE — Interval H&P Note (Signed)
History and Physical Interval Note:  07/15/2019 7:26 AM  Sharon Herring  has presented today for surgery, with the diagnosis of Ventral hernia.  The various methods of treatment have been discussed with the patient and family. After consideration of risks, benefits and other options for treatment, the patient has consented to  Procedure(s): XI ROBOTIC [place] (N/A) as a surgical intervention.  The patient's history has been reviewed, patient examined, no change in status, stable for surgery.  I have reviewed the patient's chart and labs.  Questions were answered to the patient's satisfaction.     [place]

## 2019-07-15 NOTE — Progress Notes (Signed)
Ibuprofen 800 mg # 40 sent to pharmacy per DR.Pabon. Patient notified.

## 2019-07-15 NOTE — Anesthesia Postprocedure Evaluation (Signed)
Anesthesia Post Note  Patient: Sharon Herring  Procedure(s) Performed: XI ROBOTIC ASSISTED VENTRAL HERNIA (N/A )  Patient location during evaluation: PACU Anesthesia Type: General Level of consciousness: awake and alert Pain management: pain level controlled Vital Signs Assessment: post-procedure vital signs reviewed and stable Respiratory status: spontaneous breathing, nonlabored ventilation, respiratory function stable and patient connected to nasal cannula oxygen Cardiovascular status: blood pressure returned to baseline and stable Postop Assessment: no apparent nausea or vomiting Anesthetic complications: no     Last Vitals:  Vitals:   07/15/19 1053 07/15/19 1126  BP:  125/81  Pulse: 81 88  Resp: 16 16  Temp: (!) 36.3 C   SpO2: 95% 100%    Last Pain:  Vitals:   07/15/19 1126  TempSrc:   PainSc: 4                  Khyle Goodell S

## 2019-07-15 NOTE — Anesthesia Preprocedure Evaluation (Signed)
Anesthesia Evaluation  Patient identified by MRN, date of birth, ID band Patient awake    Reviewed: Allergy & Precautions, NPO status , Patient's Chart, lab work & pertinent test results, reviewed documented beta blocker date and time   Airway Mallampati: III  TM Distance: >3 FB     Dental  (+) Chipped   Pulmonary sleep apnea ,           Cardiovascular      Neuro/Psych  Headaches,    GI/Hepatic GERD  Controlled,  Endo/Other    Renal/GU      Musculoskeletal   Abdominal   Peds  Hematology  (+) anemia ,   Anesthesia Other Findings   Reproductive/Obstetrics                             Anesthesia Physical Anesthesia Plan  ASA: III  Anesthesia Plan: General   Post-op Pain Management:    Induction: Intravenous  PONV Risk Score and Plan:   Airway Management Planned: Oral ETT  Additional Equipment:   Intra-op Plan:   Post-operative Plan:   Informed Consent: I have reviewed the patients History and Physical, chart, labs and discussed the procedure including the risks, benefits and alternatives for the proposed anesthesia with the patient or authorized representative who has indicated his/her understanding and acceptance.       Plan Discussed with: CRNA  Anesthesia Plan Comments:         Anesthesia Quick Evaluation

## 2019-07-15 NOTE — Discharge Instructions (Addendum)
AMBULATORY SURGERY  DISCHARGE INSTRUCTIONS   1) The drugs that you were given will stay in your system until tomorrow so for the next 24 hours you should not:  A) Drive an automobile B) Make any legal decisions C) Drink any alcoholic beverage   2) You may resume regular meals tomorrow.  Today it is better to start with liquids and gradually work up to solid foods.  You may eat anything you prefer, but it is better to start with liquids, then soup and crackers, and gradually work up to solid foods.   3) Please notify your doctor immediately if you have any unusual bleeding, trouble breathing, redness and pain at the surgery site, drainage, fever, or pain not relieved by medication. 4)   5) Your post-operative visit with Dr.                                     is: Date:                        Time:    Please call to schedule your post-operative visit.  6) Additional Instructions: Norco taken 07/15/2019 at 10:34am. Next dose if needed at 4:34pm

## 2019-07-15 NOTE — Anesthesia Procedure Notes (Addendum)
Procedure Name: Intubation Date/Time: 07/15/2019 7:44 AM Performed by: Lily Peer, Summer, RN Pre-anesthesia Checklist: Patient identified, Patient being monitored, Timeout performed, Emergency Drugs available and Suction available Patient Re-evaluated:Patient Re-evaluated prior to induction Oxygen Delivery Method: Circle system utilized Preoxygenation: Pre-oxygenation with 100% oxygen Induction Type: IV induction Ventilation: Mask ventilation without difficulty Laryngoscope Size: Mac and 3 Grade View: Grade I Tube type: Oral Tube size: 7.0 mm Number of attempts: 1 Airway Equipment and Method: Stylet Placement Confirmation: ETT inserted through vocal cords under direct vision,  positive ETCO2 and breath sounds checked- equal and bilateral Secured at: 21 cm Tube secured with: Tape Dental Injury: Teeth and Oropharynx as per pre-operative assessment

## 2019-07-15 NOTE — Op Note (Signed)
Robotic assisted laparoscopic ventral hernia Repair IPOM using 10.4cm  round ventralight BARD mesh   Pre-operative Diagnosis: ventral hernia   Post-operative Diagnosis: same   Surgeon: Caroleen Hamman, MD FACS   Anesthesia: Gen. with endotracheal tube     Findings: 3.5 cm ventral hernia    Estimated Blood Loss: 5cc         Complications: none             Procedure Details  The patient was seen again in the Holding Room. The benefits, complications, treatment options, and expected outcomes were discussed with the patient. The risks of bleeding, infection, recurrence of symptoms, failure to resolve symptoms, bowel injury, mesh placement, mesh infection, any of which could require further surgery were reviewed with the patient. The likelihood of improving the patient's symptoms with return to their baseline status is good.  The patient and/or family concurred with the proposed plan, giving informed consent.  The patient was taken to Operating Room, identified and the procedure verified.  A Time Out was held and the above information confirmed.   Prior to the induction of general anesthesia, antibiotic prophylaxis was administered. VTE prophylaxis was in place. General endotracheal anesthesia was then administered and tolerated well. After the induction, the abdomen was prepped with Chloraprep and draped in the sterile fashion. The patient was positioned in the supine position.   We used a left upper quadrant subcostal incision and using a cutdown technique were able to identify the fascia both anteriorly and posteriorly elevated incised and 2 stay sutures were placed.  Hassan trocar inserted and pneumoperitoneum obtained.  No hemodynamic compromise.   2 additional 8 mm ports were placed under direct visualization.  I visualized the hernia and there was an epigastric ventral hernia measuring approximately 3.5 cm.  At this time I went ahead and inserted the mesh with an echo location.   The robot  was brought to the surgical field and docked in the standard fashion.  We made sure that all instrumentation was kept under direct vision at all times and there was no collision between the arms.  I scrubbed out and went to the console.   Confirm and measured that the defect was 3.5 cm.  .  Using a 0 Stratafix suture we closed the ventral defect primarily.  The PMI was used to pierce the defect in the center.  Using the mesh and the echo location device we were able to bring the mesh towards the abdominal wall. Falciform was taken down with electrocautery to allow adequate mesh placement.   The mesh was secured circumferentially to the abdominal wall using 2 OV lock in the standard fashion.   The mesh layed really nicely against the  abdominal wall. A second look laparoscopy revealed no evidence of intra-abdominal injury.    All the needles and foreign objects were removed under direct visualization.  The instruments were removed and the robot was undocked.  I scrubbed back in, The laparoscopic ports were removed under direct visualization and the pneumoperitoneum was deflated.   Incisions were closed with  4-0 Monocryl  And the fascial sutures approximated in the standard fashion Dermabond was used to coat the skin. Liposomal Marcaine qwas used to inject all the incision sites. Patient tolerated procedure well and there were no immediate complications. Needle and laparotomy counts were correct    Caroleen Hamman, MD, FACS

## 2019-07-15 NOTE — Anesthesia Post-op Follow-up Note (Signed)
Anesthesia QCDR form completed.        

## 2019-07-15 NOTE — Transfer of Care (Signed)
Immediate Anesthesia Transfer of Care Note  Patient: Sharon Herring  Procedure(s) Performed: XI ROBOTIC ASSISTED VENTRAL HERNIA (N/A )  Patient Location: PACU  Anesthesia Type:General  Level of Consciousness: drowsy and patient cooperative  Airway & Oxygen Therapy: Patient Spontanous Breathing and Patient connected to face mask oxygen  Post-op Assessment: Report given to RN and Post -op Vital signs reviewed and stable  Post vital signs: Reviewed and stable  Last Vitals:  Vitals Value Taken Time  BP 143/98 07/15/19 0934  Temp 36.9 C 07/15/19 0934  Pulse 79 07/15/19 0937  Resp 16 07/15/19 0937  SpO2 100 % 07/15/19 0937  Vitals shown include unvalidated device data.  Last Pain:  Vitals:   07/15/19 0934  TempSrc:   PainSc: Asleep         Complications: No apparent anesthesia complications

## 2019-07-16 ENCOUNTER — Other Ambulatory Visit: Payer: Self-pay | Admitting: Surgery

## 2019-07-16 ENCOUNTER — Telehealth: Payer: Self-pay | Admitting: *Deleted

## 2019-07-16 ENCOUNTER — Encounter: Payer: Self-pay | Admitting: Surgery

## 2019-07-16 MED ORDER — TRAMADOL HCL 50 MG PO TABS: 50.0000 mg | ORAL_TABLET | ORAL | 0 refills | Status: DC | PRN

## 2019-07-16 NOTE — Telephone Encounter (Signed)
Patient notified to pick up medication and to take narcotic and drop in drop box since she is unable to take.

## 2019-07-16 NOTE — Telephone Encounter (Signed)
Patient called and stated that she had surgery yesterday 07/15/19 by Dr.Pabon ventral hernia repair, she stated that the pain medication is not working, she is taking Hyrdocodone and its just making her sleep and not helping with the pain

## 2019-07-17 ENCOUNTER — Other Ambulatory Visit: Payer: Self-pay

## 2019-07-17 ENCOUNTER — Inpatient Hospital Stay: Payer: No Typology Code available for payment source

## 2019-07-22 ENCOUNTER — Other Ambulatory Visit: Payer: Self-pay

## 2019-07-22 ENCOUNTER — Inpatient Hospital Stay: Payer: No Typology Code available for payment source

## 2019-07-22 VITALS — BP 113/78 | HR 76 | Temp 97.7°F | Resp 16

## 2019-07-22 DIAGNOSIS — D509 Iron deficiency anemia, unspecified: Secondary | ICD-10-CM

## 2019-07-22 DIAGNOSIS — D500 Iron deficiency anemia secondary to blood loss (chronic): Secondary | ICD-10-CM | POA: Diagnosis not present

## 2019-07-22 MED ORDER — CYANOCOBALAMIN 1000 MCG/ML IJ SOLN
1000.0000 ug | Freq: Once | INTRAMUSCULAR | Status: AC
  Administered 2019-07-22: 1000 ug via INTRAMUSCULAR

## 2019-07-22 MED ORDER — SODIUM CHLORIDE 0.9% FLUSH
10.0000 mL | Freq: Once | INTRAVENOUS | Status: DC | PRN
  Filled 2019-07-22: qty 10

## 2019-07-22 MED ORDER — SODIUM CHLORIDE 0.9% FLUSH
3.0000 mL | Freq: Once | INTRAVENOUS | Status: DC | PRN
  Filled 2019-07-22: qty 3

## 2019-07-22 MED ORDER — SODIUM CHLORIDE 0.9 % IV SOLN
510.0000 mg | Freq: Once | INTRAVENOUS | Status: AC
  Administered 2019-07-22: 510 mg via INTRAVENOUS
  Filled 2019-07-22: qty 17

## 2019-07-22 MED ORDER — SODIUM CHLORIDE 0.9 % IV SOLN
Freq: Once | INTRAVENOUS | Status: AC
  Administered 2019-07-22: 14:00:00 via INTRAVENOUS
  Filled 2019-07-22: qty 250

## 2019-07-22 NOTE — Patient Instructions (Signed)
Cyanocobalamin, Vitamin B12 injection What is this medicine? CYANOCOBALAMIN (sye an oh koe BAL a min) is a man made form of vitamin B12. Vitamin B12 is used in the growth of healthy blood cells, nerve cells, and proteins in the body. It also helps with the metabolism of fats and carbohydrates. This medicine is used to treat people who can not absorb vitamin B12. This medicine may be used for other purposes; ask your health care provider or pharmacist if you have questions. COMMON BRAND NAME(S): B-12 Compliance Kit, B-12 Injection Kit, Cyomin, LA-12, Nutri-Twelve, Physicians EZ Use B-12, Primabalt What should I tell my health care provider before I take this medicine? They need to know if you have any of these conditions:  kidney disease  Leber's disease  megaloblastic anemia  an unusual or allergic reaction to cyanocobalamin, cobalt, other medicines, foods, dyes, or preservatives  pregnant or trying to get pregnant  breast-feeding How should I use this medicine? This medicine is injected into a muscle or deeply under the skin. It is usually given by a health care professional in a clinic or doctor's office. However, your doctor may teach you how to inject yourself. Follow all instructions. Talk to your pediatrician regarding the use of this medicine in children. Special care may be needed. Overdosage: If you think you have taken too much of this medicine contact a poison control center or emergency room at once. NOTE: This medicine is only for you. Do not share this medicine with others. What if I miss a dose? If you are given your dose at a clinic or doctor's office, call to reschedule your appointment. If you give your own injections and you miss a dose, take it as soon as you can. If it is almost time for your next dose, take only that dose. Do not take double or extra doses. What may interact with this medicine?  colchicine  heavy alcohol intake This list may not describe all  possible interactions. Give your health care provider a list of all the medicines, herbs, non-prescription drugs, or dietary supplements you use. Also tell them if you smoke, drink alcohol, or use illegal drugs. Some items may interact with your medicine. What should I watch for while using this medicine? Visit your doctor or health care professional regularly. You may need blood work done while you are taking this medicine. You may need to follow a special diet. Talk to your doctor. Limit your alcohol intake and avoid smoking to get the best benefit. What side effects may I notice from receiving this medicine? Side effects that you should report to your doctor or health care professional as soon as possible:  allergic reactions like skin rash, itching or hives, swelling of the face, lips, or tongue  blue tint to skin  chest tightness, pain  difficulty breathing, wheezing  dizziness  red, swollen painful area on the leg Side effects that usually do not require medical attention (report to your doctor or health care professional if they continue or are bothersome):  diarrhea  headache This list may not describe all possible side effects. Call your doctor for medical advice about side effects. You may report side effects to FDA at 1-800-FDA-1088. Where should I keep my medicine? Keep out of the reach of children. Store at room temperature between 15 and 30 degrees C (59 and 85 degrees F). Protect from light. Throw away any unused medicine after the expiration date. NOTE: This sheet is a summary. It may not cover  all possible information. If you have questions about this medicine, talk to your doctor, pharmacist, or health care provider.  2020 Elsevier/Gold Standard (2007-11-19 22:10:20) Ferumoxytol injection What is this medicine? FERUMOXYTOL is an iron complex. Iron is used to make healthy red blood cells, which carry oxygen and nutrients throughout the body. This medicine is used to  treat iron deficiency anemia. This medicine may be used for other purposes; ask your health care provider or pharmacist if you have questions. COMMON BRAND NAME(S): Feraheme What should I tell my health care provider before I take this medicine? They need to know if you have any of these conditions:  anemia not caused by low iron levels  high levels of iron in the blood  magnetic resonance imaging (MRI) test scheduled  an unusual or allergic reaction to iron, other medicines, foods, dyes, or preservatives  pregnant or trying to get pregnant  breast-feeding How should I use this medicine? This medicine is for injection into a vein. It is given by a health care professional in a hospital or clinic setting. Talk to your pediatrician regarding the use of this medicine in children. Special care may be needed. Overdosage: If you think you have taken too much of this medicine contact a poison control center or emergency room at once. NOTE: This medicine is only for you. Do not share this medicine with others. What if I miss a dose? It is important not to miss your dose. Call your doctor or health care professional if you are unable to keep an appointment. What may interact with this medicine? This medicine may interact with the following medications:  other iron products This list may not describe all possible interactions. Give your health care provider a list of all the medicines, herbs, non-prescription drugs, or dietary supplements you use. Also tell them if you smoke, drink alcohol, or use illegal drugs. Some items may interact with your medicine. What should I watch for while using this medicine? Visit your doctor or healthcare professional regularly. Tell your doctor or healthcare professional if your symptoms do not start to get better or if they get worse. You may need blood work done while you are taking this medicine. You may need to follow a special diet. Talk to your doctor.  Foods that contain iron include: whole grains/cereals, dried fruits, beans, or peas, leafy green vegetables, and organ meats (liver, kidney). What side effects may I notice from receiving this medicine? Side effects that you should report to your doctor or health care professional as soon as possible:  allergic reactions like skin rash, itching or hives, swelling of the face, lips, or tongue  breathing problems  changes in blood pressure  feeling faint or lightheaded, falls  fever or chills  flushing, sweating, or hot feelings  swelling of the ankles or feet Side effects that usually do not require medical attention (report to your doctor or health care professional if they continue or are bothersome):  diarrhea  headache  nausea, vomiting  stomach pain This list may not describe all possible side effects. Call your doctor for medical advice about side effects. You may report side effects to FDA at 1-800-FDA-1088. Where should I keep my medicine? This drug is given in a hospital or clinic and will not be stored at home. NOTE: This sheet is a summary. It may not cover all possible information. If you have questions about this medicine, talk to your doctor, pharmacist, or health care provider.  2020 Elsevier/Gold Standard (  2016-09-26 20:21:10)  

## 2019-07-23 ENCOUNTER — Telehealth: Payer: Self-pay | Admitting: *Deleted

## 2019-07-23 NOTE — Telephone Encounter (Signed)
Faxed FMLA to [employer] to [phone]

## 2019-07-25 ENCOUNTER — Telehealth: Payer: Self-pay | Admitting: *Deleted

## 2019-07-25 NOTE — Telephone Encounter (Signed)
Patient states that pain started on Sunday night just above the belly button. She says yesterday the pain has been constant. She reports a little knot in the area above the belly button. No chills or fever.  I will speak with the provider today to see if the patient should come in early to be seen.

## 2019-07-25 NOTE — Telephone Encounter (Signed)
Patient had surgery on 07/15/2019 with Dr.Pabon Incisional hernia and she stated that she fell in the shower Friday night and is now having pain near the incision and pain at the top of stomach. She wants to know if that is okay. Please call and advise

## 2019-07-25 NOTE — Telephone Encounter (Signed)
Patient unable to come in this afternoon as she does not have a ride. Will come in tomorrow morning to be seen by Dr Hampton Abbot.

## 2019-07-26 ENCOUNTER — Ambulatory Visit (INDEPENDENT_AMBULATORY_CARE_PROVIDER_SITE_OTHER): Payer: No Typology Code available for payment source | Admitting: Surgery

## 2019-07-26 ENCOUNTER — Encounter: Payer: Self-pay | Admitting: Surgery

## 2019-07-26 ENCOUNTER — Other Ambulatory Visit: Payer: Self-pay

## 2019-07-26 VITALS — BP 137/89 | HR 83 | Temp 98.7°F | Resp 14 | Ht 64.5 in | Wt 218.0 lb

## 2019-07-26 DIAGNOSIS — K432 Incisional hernia without obstruction or gangrene: Secondary | ICD-10-CM

## 2019-07-26 DIAGNOSIS — Z09 Encounter for follow-up examination after completed treatment for conditions other than malignant neoplasm: Secondary | ICD-10-CM

## 2019-07-26 MED ORDER — IBUPROFEN 800 MG PO TABS: 800.0000 mg | ORAL_TABLET | Freq: Three times a day (TID) | ORAL | 0 refills | Status: DC | PRN

## 2019-07-26 NOTE — Patient Instructions (Addendum)
  Please see your follow up appointment listed below. Pick up your medication at the pharmacy today.   You may apply heat to the area.       GENERAL POST-OPERATIVE PATIENT INSTRUCTIONS   WOUND CARE INSTRUCTIONS:  Keep a dry clean dressing on the wound if there is drainage. The initial bandage may be removed after 24 hours.  Once the wound has quit draining you may leave it open to air.  If clothing rubs against the wound or causes irritation and the wound is not draining you may cover it with a dry dressing during the daytime.  Try to keep the wound dry and avoid ointments on the wound unless directed to do so.  If the wound becomes bright red and painful or starts to drain infected material that is not clear, please contact your physician immediately.  If the wound is mildly pink and has a thick firm ridge underneath it, this is normal, and is referred to as a healing ridge.  This will resolve over the next 4-6 weeks.  BATHING: You may shower if you have been informed of this by your surgeon. However, Please do not submerge in a tub, hot tub, or pool until incisions are completely sealed or have been told by your surgeon that you may do so.  DIET:  You may eat any foods that you can tolerate.  It is a good idea to eat a high fiber diet and take in plenty of fluids to prevent constipation.  If you do become constipated you may want to take a mild laxative or take ducolax tablets on a daily basis until your bowel habits are regular.  Constipation can be very uncomfortable, along with straining, after recent surgery.  ACTIVITY:  You are encouraged to cough and deep breath or use your incentive spirometer if you were given one, every 15-30 minutes when awake.  This will help prevent respiratory complications and low grade fevers post-operatively if you had a general anesthetic.  You may want to hug a pillow when coughing and sneezing to add additional support to the surgical area, if you had abdominal  or chest surgery, which will decrease pain during these times.  You are encouraged to walk and engage in light activity for the next two weeks.  You should not lift more than 20 pounds, until 08/25/2018 as it could put you at increased risk for complications.  Twenty pounds is roughly equivalent to a plastic bag of groceries. At that time- Listen to your body when lifting, if you have pain when lifting, stop and then try again in a few days. Soreness after doing exercises or activities of daily living is normal as you get back in to your normal routine.  MEDICATIONS:  Try to take narcotic medications and anti-inflammatory medications, such as tylenol, ibuprofen, naprosyn, etc., with food.  This will minimize stomach upset from the medication.  Should you develop nausea and vomiting from the pain medication, or develop a rash, please discontinue the medication and contact your physician.  You should not drive, make important decisions, or operate machinery when taking narcotic pain medication.  SUNBLOCK Use sun block to incision area over the next year if this area will be exposed to sun. This helps decrease scarring and will allow you avoid a permanent darkened area over your incision.  QUESTIONS:  Please feel free to call our office if you have any questions, and we will be glad to assist you. [phone]

## 2019-07-26 NOTE — Progress Notes (Signed)
07/26/2019  HPI: Sharon Herring is a 43 y.o. female s/p robotic assisted laparoscopic umbilical hernia repair with mesh by Dr. Dahlia Byes on 11/23.  She reports that she had been doing well, but she fell in the shower about 5 days ago, and since then she has been having more pain at the umbilical area, in the point where her hernia was located.  She reports that taking Ibuprofen 800 mg is helping.  Denies any fevers, chills, other areas of pain, nausea, or vomiting.  She's having bowel function with flatus and bowel movements.  Vital signs: BP 137/89   Pulse 83   Temp 98.7 F (37.1 C) (Temporal)   Resp 14   Ht 5' 4.5" (1.638 m)   Wt 218 lb (98.9 kg)   LMP 07/06/2019   SpO2 98%   BMI 36.84 kg/m    Physical Exam: Constitutional: No acute distress Abdomen: Soft, non-distended, with tenderness to palpation in the supraumbilical area just above the umbilicus where her hernia was located.  Small stab wound in umbilicus from the echo positioning system is healing well and with dermabond.  Three left lateral incisions from robotic arms are healing well with dermabond.  No evidence of hernia recurrence, but she's tender in the area and I'm unable to palpate deeply.  Assessment/Plan: This is a 43 y.o. female s/p robotic umbilical hernia repair with mesh.  --Discussed with the patient that with her fall, she could have either torn some sutures that were closing the hernia defect itself, or perhaps some sutures that are holding the mesh in place.  I would say the former is more likely, as her pain is at the prior hernia site itself rather than around it.  This likely has caused an inflammatory reaction which is causing her pain.  I do not feel a hernia defect, so this is likely still covered by the mesh.  At this point, I think it's fine to proceed with watchful waiting.  Will refill her Ibuprofen prescription for now and she will follow up next week as scheduled to check on her pain.  If no improvement, or  any worsening, we would proceed with ordering a CT scan to further evaluate.  She understands this and all her questions were answered.   Melvyn Neth, [facility]

## 2019-07-30 ENCOUNTER — Ambulatory Visit (INDEPENDENT_AMBULATORY_CARE_PROVIDER_SITE_OTHER): Payer: No Typology Code available for payment source | Admitting: Physician Assistant

## 2019-07-30 ENCOUNTER — Telehealth: Payer: Self-pay | Admitting: Family Medicine

## 2019-07-30 ENCOUNTER — Other Ambulatory Visit: Payer: Self-pay

## 2019-07-30 ENCOUNTER — Encounter: Payer: Self-pay | Admitting: Physician Assistant

## 2019-07-30 VITALS — BP 134/85 | HR 80 | Temp 97.2°F | Ht 64.5 in | Wt 221.0 lb

## 2019-07-30 DIAGNOSIS — Z111 Encounter for screening for respiratory tuberculosis: Secondary | ICD-10-CM

## 2019-07-30 DIAGNOSIS — Z09 Encounter for follow-up examination after completed treatment for conditions other than malignant neoplasm: Secondary | ICD-10-CM

## 2019-07-30 DIAGNOSIS — K432 Incisional hernia without obstruction or gangrene: Secondary | ICD-10-CM

## 2019-07-30 NOTE — Patient Instructions (Addendum)
Patient will return in two weeks for a follow up appointment with Sharon Ket, PA-C. Patient is to refrain from any heavy lifting. Patient will continue with the Ibuprofen to help with the pain and discomfort. Patient may also continue to use cold compress/ice pack to help with pain and discomfort.  Laparoscopic Ventral Hernia Repair, Care After This sheet gives you information about how to care for yourself after your procedure. Your health care provider may also give you more specific instructions. If you have problems or questions, contact your health care provider. What can I expect after the procedure? After the procedure, it is common to have:  Pain, discomfort, or soreness. Follow these instructions at home: Incision care   Follow instructions from your health care provider about how to take care of your incision. Make sure you: ? Wash your hands with soap and water before you change your bandage (dressing) or before you touch your abdomen. If soap and water are not available, use hand sanitizer. ? Change your dressing as told by your health care provider. ? Leave stitches (sutures), skin glue, or adhesive strips in place. These skin closures may need to stay in place for 2 weeks or longer. If adhesive strip edges start to loosen and curl up, you may trim the loose edges. Do not remove adhesive strips completely unless your health care provider tells you to do that.  Check your incision area every day for signs of infection. Check for: ? Redness, swelling, or pain. ? Fluid or blood. ? Warmth. ? Pus or a bad smell. Bathing   Do not take baths, swim, or use a hot tub until your health care provider approves. Ask your health care provider if you can take showers. You may only be allowed to take sponge baths for bathing.  Keep your bandage (dressing) dry until your health care provider says it can be removed. Activity  Do not lift anything that is heavier than 10 lb (4.5 kg) until  your health care provider approves.  Do not drive or use heavy machinery while taking prescription pain medicine. Ask your health care provider when it is safe for you to drive or use heavy machinery.  Do not drive for 24 hours if you were given a medicine to help you relax (sedative) during your procedure.  Rest as told by your health care provider. You may return to your normal activities when your health care provider approves. General instructions  Take over-the-counter and prescription medicines only as told by your health care provider.  To prevent or treat constipation while you are taking prescription pain medicine, your health care provider may recommend that you: ? Take over-the-counter or prescription medicines. ? Eat foods that are high in fiber, such as fresh fruits and vegetables, whole grains, and beans. ? Limit foods that are high in fat and processed sugars, such as fried and sweet foods.  Drink enough fluid to keep your urine clear or pale yellow.  Hold a pillow over your abdomen when you cough or sneeze. This helps with pain.  Keep all follow-up visits as told by your health care provider. This is important. Contact a health care provider if:  You have: ? A fever or chills. ? Redness, swelling, or pain around your incision. ? Fluid or blood coming from your incision. ? Pus or a bad smell coming from your incision. ? Pain that gets worse or does not get better with medicine. ? Nausea or vomiting. ? A cough. ?  Shortness of breath.  Your incision feels warm to the touch.  You have not had a bowel movement in three days.  You are not able to urinate. Get help right away if:  You have severe pain in your abdomen.  You have persistent nausea and vomiting.  You have redness, warmth, or pain in your leg.  You have chest pain.  You have trouble breathing. Summary  After this procedure, it is common to have pain, discomfort, or soreness.  Follow  instructions from your health care provider about how to take care of your incision.  Check your incision area every day for signs of infection. Report any signs of infection to your health care provider.  Keep all follow-up visits as told by your health care provider. This is important. This information is not intended to replace advice given to you by your health care provider. Make sure you discuss any questions you have with your health care provider. Document Released: 07/25/2012 Document Revised: 07/21/2017 Document Reviewed: 03/30/2016 Elsevier Patient Education  2020 [employer].

## 2019-07-30 NOTE — Progress Notes (Signed)
[facility] POST-OP OFFICE VISIT  07/30/2019  HPI: Sharon Herring is a 43 y.o. female 15 days s/p robotic assisted laparoscopic ventral hernia repair with Dr Dahlia Byes. She had fallen in the shower and was seen on 12/04 for worsening umbilical pain. No hernia defect palpable at that time. She was given a refill of ibuprofen and we proceeded with observation.   Today, she reports she is still having pain at her umbilicus and superior to this. Dull ache which is intermittently sharp in nature. Ibuprofen is helping with this pain. No fever, chills, nausea, or emesis. She does feel some "swelling" in this area. Normal bowel function.   Vital signs: BP 134/85   Pulse 80   Temp (!) 97.2 F (36.2 C) (Temporal)   Ht 5' 4.5" (1.638 m)   Wt 221 lb (100.2 kg)   LMP 07/06/2019   SpO2 99%   BMI 37.35 kg/m    Physical Exam: Constitutional: Well appearing female, NAD Abdomen: Obese, soft, she is primarily tender just superior to the umbilicus, there is palpable firmness in this area which I believe is likely induration, no bulging or worsening with standing or valsalva maneuver, no palpable defect.   Skin: Laparoscopic incisions are CDI with dermabond, no erythema or drainage  Assessment/Plan: This is a 43 y.o. female 15 days s/p robotic assisted laparoscopic ventral hernia repair with Dr Dahlia Byes.   - I agree that this does not appear to be recurrence of hernia after fall, suspect this is inflammatory response with palpable induration on examination. It has only been 4 days since last evaluation. At this point, I think it is reasonable to continue with ibuprofen for pain control, lifting restrictions, and observation. We discussed signs and symptoms that should prompt return to the clinic sooner. Otherwise, I will see her again in ~2 weeks and if the pain persists or anything changes we could obtain imaging to definitively rule out recurrence. She is understanding and agreeable with this  plan.   -- Edison Simon, PA-C [facility] 07/30/2019, 9:50 AM [phone] M-F: 7am - 4pm

## 2019-07-30 NOTE — Telephone Encounter (Signed)
Pt needs order for TB test

## 2019-07-31 ENCOUNTER — Telehealth: Payer: Self-pay | Admitting: *Deleted

## 2019-07-31 NOTE — Telephone Encounter (Signed)
Faxed FMLA paperwork to [employer] to [phone]

## 2019-08-01 LAB — QUANTIFERON-TB GOLD PLUS
Mitogen-NIL: >10 IU/mL
NIL: 0.05 IU/mL
QuantiFERON-TB Gold Plus: NEGATIVE
TB1-NIL: 0 IU/mL
TB2-NIL: 0 IU/mL

## 2019-08-12 ENCOUNTER — Encounter: Payer: Self-pay | Admitting: Physician Assistant

## 2019-08-12 ENCOUNTER — Ambulatory Visit (INDEPENDENT_AMBULATORY_CARE_PROVIDER_SITE_OTHER): Payer: No Typology Code available for payment source | Admitting: Physician Assistant

## 2019-08-12 ENCOUNTER — Other Ambulatory Visit: Payer: Self-pay

## 2019-08-12 VITALS — BP 121/86 | HR 73 | Temp 97.2°F | Resp 15 | Ht 64.5 in | Wt 219.0 lb

## 2019-08-12 DIAGNOSIS — Z09 Encounter for follow-up examination after completed treatment for conditions other than malignant neoplasm: Secondary | ICD-10-CM

## 2019-08-12 NOTE — Progress Notes (Signed)
[facility] POST-OP OFFICE VISIT  08/12/2019  HPI: Sharon Herring is a 43 y.o. female ~ 1 month s/p robotic assisted ventral hernia repair with Dr Dahlia Byes. She had issues with pain and swelling following a fall a few days post-op.   Today, she reports that she is doing much better now. Still with intermittent soreness but pain compared to previous visits is much improved. No fever, chills, nausea, or emesis. Otherwise doing well. No new complaints.   Vital signs: BP 121/86   Pulse 73   Temp (!) 97.2 F (36.2 C)   Resp 15   Ht 5' 4.5" (1.638 m)   Wt 219 lb (99.3 kg)   LMP 08/02/2019 (Exact Date)   SpO2 97%   BMI 37.01 kg/m    Physical Exam: Constitutional: Well appearing female, NAD Abdomen: Soft, non-tender, non-distended, No rebound/guarding Skin: Laparoscopic incisions are well healed, there is some induration around umbilical incision, no erythema or drainage   Assessment/Plan: This is a 43 y.o. female  ~ 1 month s/p robotic assisted ventral hernia repair   - pain control prn  - she has complete lifting restrictions  - no new issues  - rtc prn  -- Edison Simon, PA-C [facility] 08/12/2019, 11:00 AM [phone] M-F: 7am - 4pm

## 2019-08-12 NOTE — Patient Instructions (Signed)
   Follow-up with our office as needed.  Please call and ask to speak with a nurse if you develop questions or concerns.  

## 2019-08-14 ENCOUNTER — Inpatient Hospital Stay: Payer: No Typology Code available for payment source | Attending: Hematology and Oncology

## 2019-08-21 ENCOUNTER — Telehealth: Payer: Self-pay | Admitting: *Deleted

## 2019-08-21 NOTE — Telephone Encounter (Signed)
Patient called and needs a back to work note stating that she can return with NO restrictions, if she can print it from [place] that would be great if not she can come pick it up. Please call and advise

## 2019-08-21 NOTE — Telephone Encounter (Signed)
Left detail message for patient to notify her that I completed her return to work note and send it via MyChart via patient's request. Also, notified her if she has any questions or concerns to give our office a call back.

## 2019-09-10 ENCOUNTER — Other Ambulatory Visit: Payer: Self-pay

## 2019-09-11 ENCOUNTER — Inpatient Hospital Stay: Payer: No Typology Code available for payment source | Attending: Hematology and Oncology

## 2019-09-11 DIAGNOSIS — D500 Iron deficiency anemia secondary to blood loss (chronic): Secondary | ICD-10-CM | POA: Insufficient documentation

## 2019-09-16 ENCOUNTER — Encounter: Payer: Self-pay | Admitting: Family Medicine

## 2019-09-16 ENCOUNTER — Other Ambulatory Visit: Payer: Self-pay

## 2019-09-16 ENCOUNTER — Ambulatory Visit (INDEPENDENT_AMBULATORY_CARE_PROVIDER_SITE_OTHER): Payer: No Typology Code available for payment source | Admitting: Family Medicine

## 2019-09-16 VITALS — BP 122/73 | HR 76 | Temp 98.6°F | Resp 16 | Ht 64.5 in | Wt 189.2 lb

## 2019-09-16 DIAGNOSIS — R7303 Prediabetes: Secondary | ICD-10-CM

## 2019-09-16 DIAGNOSIS — E538 Deficiency of other specified B group vitamins: Secondary | ICD-10-CM

## 2019-09-16 DIAGNOSIS — E786 Lipoprotein deficiency: Secondary | ICD-10-CM | POA: Diagnosis not present

## 2019-09-16 DIAGNOSIS — E559 Vitamin D deficiency, unspecified: Secondary | ICD-10-CM

## 2019-09-16 DIAGNOSIS — R5383 Other fatigue: Secondary | ICD-10-CM

## 2019-09-16 DIAGNOSIS — Z0000 Encounter for general adult medical examination without abnormal findings: Secondary | ICD-10-CM

## 2019-09-16 DIAGNOSIS — R8290 Unspecified abnormal findings in urine: Secondary | ICD-10-CM

## 2019-09-16 DIAGNOSIS — K5909 Other constipation: Secondary | ICD-10-CM

## 2019-09-16 DIAGNOSIS — G4733 Obstructive sleep apnea (adult) (pediatric): Secondary | ICD-10-CM

## 2019-09-16 DIAGNOSIS — E669 Obesity, unspecified: Secondary | ICD-10-CM

## 2019-09-16 MED ORDER — CYANOCOBALAMIN 1000 MCG/ML IJ SOLN: 1000.0000 ug | Freq: Once | INTRAMUSCULAR | Status: DC

## 2019-09-16 MED ORDER — QSYMIA 7.5-46 MG PO CP24: 1.0000 | ORAL_CAPSULE | ORAL | 1 refills | Status: DC

## 2019-09-16 MED ORDER — LINACLOTIDE 290 MCG PO CAPS: 290.0000 ug | ORAL_CAPSULE | Freq: Every day | ORAL | 2 refills | Status: DC

## 2019-09-16 MED ORDER — QSYMIA 3.75-23 MG PO CP24: 1.0000 | ORAL_CAPSULE | ORAL | 0 refills | Status: DC

## 2019-09-16 NOTE — Progress Notes (Signed)
Name: Sharon Herring   MRN: [id number]    DOB: [birth year]   Date:09/16/2019       Progress Note  Subjective  Chief Complaint  Chief Complaint  Patient presents with  . Annual Exam    HPI  Patient presents for annual CPE and follow up  Obesity: she started Qsymia 08/2018 last visit her weight was 198 lbs, she has been out of medication for months, she had to pause because of COVID and also hernia surgery, she is down on her weight, eating healthier but would like to resume medications.   Urine change : she noticed that her urine is very concentrate but no odor this time, she is worried about an UTI and would like to have it checked   Chronic constipation: she states she has noticed worsening of symptoms, but has not been taking Linzess daily, she is feeling bloated, no blood in stools. Feels like she needs to get cleaned out  Pre-diabetes: she denies polyphagia, polydipsia or polyuria we will recheck labs  OSA: she never got the supplies, we will print another order today  Iron deficiency anemia : secondary to menorrhagia, under the care of hematologist and gyn, she is getting iron infusions, last hemoglobin was still below 10. She denies pica, but has fatigue and asked for other labs   Diet: balanced Exercise: advised 150 minutes per week   USPSTF grade A and B recommendations    Office Visit from 09/16/2019 in [facility]  AUDIT-C Score  1     Depression: Phq 9 is  negative Depression screen [facility] 2/9 09/16/2019 07/02/2019 05/29/2019 05/16/2019 05/08/2019  Decreased Interest 0 0 0 0 0  Down, Depressed, Hopeless 0 0 0 0 0  PHQ - 2 Score 0 0 0 0 0  Altered sleeping 0 0 0 0 0  Tired, decreased energy 0 0 0 0 0  Change in appetite 0 0 0 0 0  Feeling bad or failure about yourself  0 0 0 0 0  Trouble concentrating 0 0 0 0 0  Moving slowly or fidgety/restless 0 0 0 0 0  Suicidal thoughts 0 0 0 0 0  PHQ-9 Score 0 0 0 0 0  Difficult doing work/chores - Not  difficult at all Not difficult at all - Not difficult at all  Some recent data might be hidden   Hypertension: BP Readings from Last 3 Encounters:  09/16/19 122/73  08/12/19 121/86  07/30/19 134/85   Obesity: Wt Readings from Last 3 Encounters:  09/16/19 189 lb 3.2 oz (85.8 kg)  08/12/19 219 lb (99.3 kg)  07/30/19 221 lb (100.2 kg)   BMI Readings from Last 3 Encounters:  09/16/19 31.97 kg/m  08/12/19 37.01 kg/m  07/30/19 37.35 kg/m     Hep C Screening: 01/2019 STD testing and prevention (HIV/chl/gon/syphilis): not interested  Intimate partner violence:negative screen  Sexual History (Partners/Practices/Protection from [employer] hx STI/Pregnancy Plans): not currently sexually active  Pain during Intercourse: not applicable  Menstrual History/LMP/Abnormal Bleeding: seeing Dr. Marcelline Mates , has fibroid tumors , taking medications to decrease cycle Incontinence Symptoms: urine is very concentrated but no odor, she wants to be checked for UTI  Breast cancer:  - Last Mammogram: she needs to go back  - BRCA gene screening: N/A  Osteoporosis: Discussed high calcium and vitamin D supplementation, weight bearing exercises  Cervical cancer screening:sees Dr. Marcelline Mates   Skin cancer: Discussed monitoring for atypical lesions  Colorectal cancer: start at age 42  Lung cancer:   Low Dose CT Chest recommended if Age 57-80 years, 30 pack-year currently smoking OR have quit w/in 15years. Patient does not qualify.   ECG: 04/2019   Advanced Care Planning: A voluntary discussion about advance care planning including the explanation and discussion of advance directives.  Discussed health care proxy and Living will, and the patient was able to identify a health care proxy as [facility] " .  Patient does not have a living will at present time. If patient does have living will, I have requested they bring this to the clinic to be scanned in to their chart.  Lipids: Lab Results  Component Value Date    CHOL 171 01/22/2019   CHOL 170 12/14/2017   CHOL 177 07/21/2016   Lab Results  Component Value Date   HDL 35 (L) 01/22/2019   HDL 41 (L) 12/14/2017   HDL 39 (L) 07/21/2016   Lab Results  Component Value Date   LDLCALC 114 (H) 01/22/2019   LDLCALC 114 (H) 12/14/2017   LDLCALC 128 (H) 07/21/2016   Lab Results  Component Value Date   TRIG 108 01/22/2019   TRIG 63 12/14/2017   TRIG 52 07/21/2016   Lab Results  Component Value Date   CHOLHDL 4.9 01/22/2019   CHOLHDL 4.1 12/14/2017   CHOLHDL 4.5 07/21/2016   No results found for: LDLDIRECT  Glucose: Glucose, Bld  Date Value Ref Range Status  05/14/2019 164 (H) 70 - 99 mg/dL Final  05/12/2019 93 70 - 99 mg/dL Final  01/22/2019 94 65 - 99 mg/dL Final    Comment:    .            Fasting reference interval .     Patient Active Problem List   Diagnosis Date Noted  . Lymphadenopathy 01/16/2018  . Moderate obstructive sleep apnea 12/19/2017  . Swelling of limb 01/27/2017  . B12 deficiency 03/28/2016  . Vitamin D deficiency 03/28/2016  . Hyperglycemia 03/28/2016  . Multiple gastric polyps 01/08/2016  . Menorrhagia 11/13/2015  . Chronic constipation 09/23/2015  . Obesity, Class I, BMI 30.0-34.9 (see actual BMI) 06/02/2015  . Iron deficiency anemia 03/14/2014  . History of uterine fibroid 03/14/2014    Past Surgical History:  Procedure Laterality Date  . APPENDECTOMY  2012  . CHOLECYSTECTOMY    . ESOPHAGOGASTRODUODENOSCOPY (EGD) WITH PROPOFOL N/A 01/06/2016   Procedure: ESOPHAGOGASTRODUODENOSCOPY (EGD) WITH PROPOFOL;  Surgeon: Robert Bellow, MD;  Location: [facility];  Service: Endoscopy;  Laterality: N/A;  . ROBOTIC ASSISTED LAPAROSCOPIC CHOLECYSTECTOMY N/A 03/03/2016   Procedure: ROBOTIC ASSISTED LAPAROSCOPIC CHOLECYSTECTOMY ;  Surgeon: Clayburn Pert, MD;  Location: [facility] OR[facility];  Service: General;  Laterality: N/A;  . UTERINE FIBROID SURGERY  2012  . XI ROBOTIC ASSISTED VENTRAL HERNIA N/A 07/15/2019    Procedure: XI ROBOTIC ASSISTED VENTRAL HERNIA;  Surgeon: Jules Husbands, MD;  Location: [facility] ORS;  Service: General;  Laterality: N/A;    Family History  Problem Relation Age of Onset  . Hypertension Mother   . Hypertension Father   . Pancreatic cancer Father 2  . Breast cancer Neg Hx   . Colon cancer Neg Hx     Social History   Socioeconomic History  . Marital status: Single    Spouse name: Not on file  . Number of children: 0  . Years of education: Not on file  . Highest education level: Bachelor's degree (e.g., BA, AB, BS)  Occupational History  . Occupation: CMA  Tobacco Use  .  Smoking status: Never Smoker  . Smokeless tobacco: Never Used  Substance and Sexual Activity  . Alcohol use: No    Alcohol/week: 0.0 standard drinks  . Drug use: No  . Sexual activity: Not Currently    Partners: Male    Birth control/protection: Condom  Other Topics Concern  . Not on file  Social History Narrative   Lives alone, works at [employer] Strain: [place]   . Difficulty of Paying Living Expenses: Not hard at all  Food Insecurity: No Food Insecurity  . Worried About Charity fundraiser in the Last Year: Never true  . Ran Out of Food in the Last Year: Never true  Transportation Needs: No Transportation Needs  . Lack of Transportation (Medical): No  . Lack of Transportation (Non-Medical): No  Physical Activity: Insufficiently Active  . Days of Exercise per Week: 7 days  . Minutes of Exercise per Session: 20 min  Stress: No Stress Concern Present  . Feeling of Stress : Not at all  Social Connections: Slightly Isolated  . Frequency of Communication with Friends and Family: More than three times a week  . Frequency of Social Gatherings with Friends and Family: More than three times a week  . Attends Religious Services: More than 4 times per year  . Active Member of Clubs or Organizations: Yes  . Attends Archivist  Meetings: More than 4 times per year  . Marital Status: Never married  Intimate Partner Violence: Not At Risk  . Fear of Current or Ex-Partner: No  . Emotionally Abused: No  . Physically Abused: No  . Sexually Abused: No     Current Outpatient Medications:  .  albuterol (PROVENTIL) (2.5 MG/3ML) 0.083% nebulizer solution, Take 3 mLs (2.5 mg total) by nebulization every 6 (six) hours as needed for wheezing or shortness of breath., Disp: 150 mL, Rfl: 1 .  albuterol (VENTOLIN HFA) 108 (90 Base) MCG/ACT inhaler, Inhale 2 puffs into the lungs every 6 (six) hours as needed for wheezing or shortness of breath., Disp: 18 g, Rfl: 0 .  fluticasone (FLONASE) 50 MCG/ACT nasal spray, Place 2 sprays into both nostrils daily. (Patient taking differently: Place 2 sprays into both nostrils daily as needed for allergies. ), Disp: 16 g, Rfl: 6 .  furosemide (LASIX) 20 MG tablet, Take 1 tablet (20 mg total) by mouth daily. (Patient taking differently: Take 20 mg by mouth daily as needed for fluid. ), Disp: 30 tablet, Rfl: 1 .  ibuprofen (ADVIL,MOTRIN) 800 MG tablet, Take 1 tablet (800 mg total) by mouth every 8 (eight) hours as needed., Disp: 60 tablet, Rfl: 3 .  Iron-FA-B Cmp-C-Biot-Probiotic (FUSION PLUS) CAPS, Take 1 capsule by mouth daily., Disp: , Rfl:  .  Multiple Vitamins-Minerals (MULTIVITAMIN ADULT) TABS, Take 1 tablet by mouth daily., Disp: 30 tablet, Rfl: 0 .  norethindrone (AYGESTIN) 5 MG tablet, Take 1 tablet (5 mg total) by mouth daily., Disp: 30 tablet, Rfl: 2  Allergies  Allergen Reactions  . Aspirin Nausea And Vomiting  . Oxycodone-Acetaminophen Rash and Shortness Of Breath  . Lexapro [Escitalopram] Palpitations  . Tape Other (See Comments)    Burning feeling. Has left mark on skin.  Melynda Keller [Lorcaserin Hcl] Other (See Comments)    headache  . Contrave [Naltrexone-Bupropion Hcl Er]   . Dicyclomine Nausea And Vomiting  . Norethindrone Rash     ROS  Constitutional: Negative for fever  ,  positive  for  weight change.  Respiratory: Negative for cough and shortness of breath.   Cardiovascular: Negative for chest pain or palpitations.  Gastrointestinal: Negative for abdominal pain, no bowel changes.  Musculoskeletal: Negative for gait problem or joint swelling.  Skin: Negative for rash.  Neurological: Negative for dizziness or headache.  No other specific complaints in a complete review of systems (except as listed in HPI above).  Objective  Vitals:   09/16/19 1404  BP: 122/73  Pulse: 76  Resp: 16  Temp: 98.6 F (37 C)  TempSrc: Temporal  SpO2: 99%  Weight: 189 lb 3.2 oz (85.8 kg)  Height: 5' 4.5" (1.638 m)    Body mass index is 31.97 kg/m.  Physical Exam  Constitutional: Patient appears well-developed and well-nourished. Obese  No distress.  HEENT: head atraumatic, normocephalic, pupils equal and reactive to light Cardiovascular: Normal rate, regular rhythm and normal heart sounds.  No murmur heard. No BLE edema. Pulmonary/Chest: Effort normal and breath sounds normal. No respiratory distress. Abdominal: Soft.  There is no tenderness. Psychiatric: Patient has a normal mood and affect. behavior is normal. Judgment and thought content normal.  Recent Results (from the past 2160 hour(s))  Iron and TIBC     Status: None   Collection Time: 06/27/19  9:35 AM  Result Value Ref Range   Iron 58 28 - 170 ug/dL   TIBC 369 250 - 450 ug/dL   Saturation Ratios 16 10.4 - 31.8 %   UIBC 311 ug/dL    Comment: Performed at [facility], [address] [place], [place] [address]  Ferritin     Status: None   Collection Time: 06/27/19  9:35 AM  Result Value Ref Range   Ferritin 18 11 - 307 ng/mL    Comment: Performed at [facility], [address] [place], [place] [address]  CBC with Differential/Platelet     Status: Abnormal   Collection Time: 06/27/19  9:35 AM  Result Value Ref Range   WBC 4.8 4.0 - 10.5 K/uL   RBC 4.02 3.87 - 5.11 MIL/uL    Hemoglobin 9.8 (L) 12.0 - 15.0 g/dL   HCT 31.8 (L) 36.0 - 46.0 %   MCV 79.1 (L) 80.0 - 100.0 fL   MCH 24.4 (L) 26.0 - 34.0 pg   MCHC 30.8 30.0 - 36.0 g/dL   RDW 20.4 (H) 11.5 - 15.5 %   Platelets 478 (H) 150 - 400 K/uL   nRBC 0.0 0.0 - 0.2 %   Neutrophils Relative % 51 %   Neutro Abs 2.4 1.7 - 7.7 K/uL   Lymphocytes Relative 38 %   Lymphs Abs 1.8 0.7 - 4.0 K/uL   Monocytes Relative 10 %   Monocytes Absolute 0.5 0.1 - 1.0 K/uL   Eosinophils Relative 1 %   Eosinophils Absolute 0.1 0.0 - 0.5 K/uL   Basophils Relative 0 %   Basophils Absolute 0.0 0.0 - 0.1 K/uL   Immature Granulocytes 0 %   Abs Immature Granulocytes 0.02 0.00 - 0.07 K/uL    Comment: Performed at [address] [facility], [place], [place] [address]  Reticulocytes     Status: None   Collection Time: 06/27/19  9:35 AM  Result Value Ref Range   Retic Ct Pct 1.7 0.4 - 3.1 %   RBC. 4.02 3.87 - 5.11 MIL/uL   Retic Count, Absolute 68.7 19.0 - 186.0 K/uL   Immature Retic Fract 14.2 2.3 - 15.9 %    Comment:  Performed at [facility], [address] [place], [place] [address]  Pregnancy, urine     Status: None   Collection Time: 06/28/19 11:03 AM  Result Value Ref Range   Preg Test, Ur NEGATIVE NEGATIVE    Comment: Performed at [facility], [address], [place], [place] [address]  Pregnancy, urine POC     Status: None   Collection Time: 07/15/19  6:17 AM  Result Value Ref Range   Preg Test, Ur NEGATIVE NEGATIVE    Comment:        THE SENSITIVITY OF THIS METHODOLOGY IS >24 mIU/mL   QuantiFERON-TB Gold Plus     Status: None   Collection Time: 07/30/19 12:00 AM  Result Value Ref Range   QuantiFERON-TB Gold Plus NEGATIVE NEGATIVE    Comment: Negative test result. M. tuberculosis complex  infection unlikely.    NIL 0.05 IU/mL   Mitogen-NIL >10.00 IU/mL   TB1-NIL 0.00 IU/mL   TB2-NIL 0.00 IU/mL    Comment: . The Nil tube value reflects the background interferon gamma immune  response of the patient's blood sample. This value has been subtracted from the patient's displayed TB and Mitogen results. . Lower than expected results with the Mitogen tube prevent false-negative Quantiferon readings by detecting a patient with a potential immune suppressive condition and/or suboptimal pre-analytical specimen handling. . The TB1 Antigen tube is coated with the M. tuberculosis-specific antigens designed to elicit responses from TB antigen primed CD4+ helper T-lymphocytes. . The TB2 Antigen tube is coated with the M. tuberculosis-specific antigens designed to elicit responses from TB antigen primed CD4+ helper and CD8+ cytotoxic T-lymphocytes. . For additional information, please refer to https://education.[facility].com/faq/FAQ204 (This link is being provided for informational/ educational purposes only.) .       Fall Risk: Fall Risk  09/16/2019 08/12/2019 07/30/2019 07/03/2019 05/16/2019  Falls in the past year? 0 0 1 0 0  Number falls in past yr: 0 0 0 - 0  Injury with Fall? 0 0 0 - 0     Functional Status Survey: Is the patient deaf or have difficulty hearing?: No Does the patient have difficulty seeing, even when wearing glasses/contacts?: No Does the patient have difficulty concentrating, remembering, or making decisions?: No Does the patient have difficulty walking or climbing stairs?: No Does the patient have difficulty dressing or bathing?: No Does the patient have difficulty doing errands alone such as visiting a doctor's office or shopping?: No   Assessment & Plan  1. Well adult exam   2. Urine abnormality  - Urine Culture  3. Low HDL (under 40)  - Lipid panel  4. B12 deficiency  - Vitamin B12  5. Vitamin D deficiency  - VITAMIN D 25 Hydroxy (Vit-D Deficiency, Fractures)  6. Fatigue, unspecified type  - TSH - COMPLETE METABOLIC PANEL WITH GFR  7. Pre-diabetes  - Hemoglobin A1c  8. Chronic constipation  -  linaclotide (LINZESS) 290 MCG CAPS capsule; Take 1 capsule (290 mcg total) by mouth daily before breakfast.  Dispense: 30 capsule; Refill: 2  9. Obesity, Class I, BMI 30.0-34.9 (see actual BMI)  - Phentermine-Topiramate (QSYMIA) 3.75-23 MG CP24; Take 1 capsule by mouth every morning.  Dispense: 30 capsule; Refill: 0 - Phentermine-Topiramate (QSYMIA) 7.5-46 MG CP24; Take 1 capsule by mouth every morning.  Dispense: 30 capsule; Refill: 1  -USPSTF grade A and B recommendations reviewed with patient; age-appropriate recommendations, preventive care, screening tests, etc discussed and encouraged; healthy living encouraged; see AVS for patient  education given to patient -Discussed importance of 150 minutes of physical activity weekly, eat two servings of fish weekly, eat one serving of tree nuts ( cashews, pistachios, pecans, almonds.Marland Kitchen) every other day, eat 6 servings of fruit/vegetables daily and drink plenty of water and avoid sweet beverages.

## 2019-09-18 ENCOUNTER — Other Ambulatory Visit: Payer: Self-pay

## 2019-09-18 ENCOUNTER — Inpatient Hospital Stay: Payer: No Typology Code available for payment source

## 2019-09-18 DIAGNOSIS — D500 Iron deficiency anemia secondary to blood loss (chronic): Secondary | ICD-10-CM | POA: Diagnosis present

## 2019-09-18 LAB — CBC WITH DIFFERENTIAL/PLATELET
Abs Immature Granulocytes: 0.03 10*3/uL (ref 0.00–0.07)
Basophils Absolute: 0 10*3/uL (ref 0.0–0.1)
Basophils Relative: 0 %
Eosinophils Absolute: 0.1 10*3/uL (ref 0.0–0.5)
Eosinophils Relative: 2 %
HCT: 34.2 % — ABNORMAL LOW (ref 36.0–46.0)
Hemoglobin: 10.4 g/dL — ABNORMAL LOW (ref 12.0–15.0)
Immature Granulocytes: 1 %
Lymphocytes Relative: 45 %
Lymphs Abs: 2.4 10*3/uL (ref 0.7–4.0)
MCH: 25.8 pg — ABNORMAL LOW (ref 26.0–34.0)
MCHC: 30.4 g/dL (ref 30.0–36.0)
MCV: 84.9 fL (ref 80.0–100.0)
Monocytes Absolute: 0.5 10*3/uL (ref 0.1–1.0)
Monocytes Relative: 10 %
Neutro Abs: 2.3 10*3/uL (ref 1.7–7.7)
Neutrophils Relative %: 42 %
Platelets: 464 10*3/uL — ABNORMAL HIGH (ref 150–400)
RBC: 4.03 MIL/uL (ref 3.87–5.11)
RDW: 15.6 % — ABNORMAL HIGH (ref 11.5–15.5)
WBC: 5.4 10*3/uL (ref 4.0–10.5)
nRBC: 0.4 % — ABNORMAL HIGH (ref 0.0–0.2)

## 2019-09-18 LAB — FERRITIN: Ferritin: 53 ng/mL (ref 11–307)

## 2019-09-19 ENCOUNTER — Inpatient Hospital Stay: Payer: No Typology Code available for payment source | Admitting: Hematology and Oncology

## 2019-09-19 ENCOUNTER — Other Ambulatory Visit: Payer: Self-pay | Admitting: Hematology and Oncology

## 2019-09-19 ENCOUNTER — Inpatient Hospital Stay: Payer: No Typology Code available for payment source

## 2019-09-25 ENCOUNTER — Other Ambulatory Visit: Payer: Self-pay

## 2019-09-25 ENCOUNTER — Inpatient Hospital Stay: Payer: No Typology Code available for payment source

## 2019-09-25 ENCOUNTER — Encounter: Payer: Self-pay | Admitting: Hematology and Oncology

## 2019-09-25 ENCOUNTER — Inpatient Hospital Stay
Payer: No Typology Code available for payment source | Attending: Hematology and Oncology | Admitting: Hematology and Oncology

## 2019-09-26 LAB — COMPREHENSIVE METABOLIC PANEL
ALT: 10 IU/L (ref 0–32)
AST: 11 IU/L (ref 0–40)
Albumin/Globulin Ratio: 1.1 — ABNORMAL LOW (ref 1.2–2.2)
Albumin: 3.8 g/dL (ref 3.8–4.8)
Alkaline Phosphatase: 76 IU/L (ref 39–117)
BUN/Creatinine Ratio: 11 (ref 9–23)
BUN: 9 mg/dL (ref 6–24)
Bilirubin Total: <0.2 mg/dL (ref 0.0–1.2)
CO2: 22 mmol/L (ref 20–29)
Calcium: 9.2 mg/dL (ref 8.7–10.2)
Chloride: 102 mmol/L (ref 96–106)
Creatinine, Ser: 0.79 mg/dL (ref 0.57–1.00)
GFR calc Af Amer: 107 mL/min/{1.73_m2} (ref >59)
GFR calc non Af Amer: 93 mL/min/{1.73_m2} (ref >59)
Globulin, Total: 3.5 g/dL (ref 1.5–4.5)
Glucose: 110 mg/dL — ABNORMAL HIGH (ref 65–99)
Potassium: 4.5 mmol/L (ref 3.5–5.2)
Sodium: 137 mmol/L (ref 134–144)
Total Protein: 7.3 g/dL (ref 6.0–8.5)

## 2019-09-26 LAB — LIPID PANEL
Chol/HDL Ratio: 4.2 ratio (ref 0.0–4.4)
Cholesterol, Total: 171 mg/dL (ref 100–199)
HDL: 41 mg/dL (ref >39)
LDL Chol Calc (NIH): 114 mg/dL — ABNORMAL HIGH (ref 0–99)
Triglycerides: 86 mg/dL (ref 0–149)
VLDL Cholesterol Cal: 16 mg/dL (ref 5–40)

## 2019-09-26 LAB — HEMOGLOBIN A1C
Est. average glucose Bld gHb Est-mCnc: 123 mg/dL
Hgb A1c MFr Bld: 5.9 % — ABNORMAL HIGH (ref 4.8–5.6)

## 2019-09-26 LAB — TSH: TSH: 2.76 u[IU]/mL (ref 0.450–4.500)

## 2019-09-26 LAB — VITAMIN B12: Vitamin B-12: 620 pg/mL (ref 232–1245)

## 2019-09-26 LAB — VITAMIN D 25 HYDROXY (VIT D DEFICIENCY, FRACTURES): Vit D, 25-Hydroxy: 8.4 ng/mL — ABNORMAL LOW (ref 30.0–100.0)

## 2019-09-29 ENCOUNTER — Other Ambulatory Visit: Payer: Self-pay | Admitting: Family Medicine

## 2019-09-29 MED ORDER — VITAMIN D (ERGOCALCIFEROL) 1.25 MG (50000 UNIT) PO CAPS: 50000.0000 [IU] | ORAL_CAPSULE | ORAL | 1 refills | Status: DC

## 2019-10-29 NOTE — Progress Notes (Signed)
This encounter was created in error - please disregard.

## 2020-01-11 ENCOUNTER — Encounter: Payer: Self-pay | Admitting: Emergency Medicine

## 2020-01-11 ENCOUNTER — Other Ambulatory Visit: Payer: Self-pay

## 2020-01-11 ENCOUNTER — Emergency Department: Payer: 59

## 2020-01-11 ENCOUNTER — Emergency Department
Admission: EM | Admit: 2020-01-11 | Discharge: 2020-01-11 | Disposition: A | Discharge status: Home / Self Care | Payer: 59 | Attending: Emergency Medicine | Admitting: Emergency Medicine

## 2020-01-11 DIAGNOSIS — Y92410 Unspecified street and highway as the place of occurrence of the external cause: Secondary | ICD-10-CM | POA: Diagnosis not present

## 2020-01-11 DIAGNOSIS — S0180XA Unspecified open wound of other part of head, initial encounter: Secondary | ICD-10-CM | POA: Diagnosis not present

## 2020-01-11 DIAGNOSIS — M25561 Pain in right knee: Secondary | ICD-10-CM | POA: Insufficient documentation

## 2020-01-11 DIAGNOSIS — M545 Low back pain: Secondary | ICD-10-CM | POA: Diagnosis not present

## 2020-01-11 DIAGNOSIS — S51812A Laceration without foreign body of left forearm, initial encounter: Secondary | ICD-10-CM | POA: Insufficient documentation

## 2020-01-11 DIAGNOSIS — R531 Weakness: Secondary | ICD-10-CM | POA: Insufficient documentation

## 2020-01-11 DIAGNOSIS — Y9389 Activity, other specified: Secondary | ICD-10-CM | POA: Insufficient documentation

## 2020-01-11 DIAGNOSIS — R0789 Other chest pain: Secondary | ICD-10-CM | POA: Insufficient documentation

## 2020-01-11 DIAGNOSIS — Z79899 Other long term (current) drug therapy: Secondary | ICD-10-CM | POA: Diagnosis not present

## 2020-01-11 DIAGNOSIS — H5712 Ocular pain, left eye: Secondary | ICD-10-CM | POA: Diagnosis not present

## 2020-01-11 DIAGNOSIS — Y999 Unspecified external cause status: Secondary | ICD-10-CM | POA: Insufficient documentation

## 2020-01-11 DIAGNOSIS — M25522 Pain in left elbow: Secondary | ICD-10-CM | POA: Diagnosis not present

## 2020-01-11 DIAGNOSIS — R42 Dizziness and giddiness: Secondary | ICD-10-CM | POA: Insufficient documentation

## 2020-01-11 DIAGNOSIS — F419 Anxiety disorder, unspecified: Secondary | ICD-10-CM | POA: Diagnosis not present

## 2020-01-11 DIAGNOSIS — S0993XA Unspecified injury of face, initial encounter: Secondary | ICD-10-CM | POA: Diagnosis present

## 2020-01-11 DIAGNOSIS — T148XXA Other injury of unspecified body region, initial encounter: Secondary | ICD-10-CM

## 2020-01-11 LAB — POCT PREGNANCY, URINE: Preg Test, Ur: NEGATIVE

## 2020-01-11 MED ORDER — KETOROLAC TROMETHAMINE 30 MG/ML IJ SOLN
15.0000 mg | Freq: Once | INTRAMUSCULAR | Status: AC
  Administered 2020-01-11: 15 mg via INTRAVENOUS
  Filled 2020-01-11: qty 1

## 2020-01-11 MED ORDER — IOHEXOL 300 MG/ML  SOLN
100.0000 mL | Freq: Once | INTRAMUSCULAR | Status: AC | PRN
  Administered 2020-01-11: 100 mL via INTRAVENOUS

## 2020-01-11 MED ORDER — CYCLOBENZAPRINE HCL 10 MG PO TABS: 10.0000 mg | ORAL_TABLET | Freq: Three times a day (TID) | ORAL | 0 refills | Status: DC | PRN

## 2020-01-11 MED ORDER — BACITRACIN ZINC 500 UNIT/GM EX OINT
TOPICAL_OINTMENT | Freq: Once | CUTANEOUS | Status: AC
  Administered 2020-01-11: 1 via TOPICAL
  Filled 2020-01-11: qty 0.9

## 2020-01-11 MED ORDER — CYCLOBENZAPRINE HCL 10 MG PO TABS
10.0000 mg | ORAL_TABLET | Freq: Once | ORAL | Status: AC
  Administered 2020-01-11: 10 mg via ORAL
  Filled 2020-01-11: qty 1

## 2020-01-11 MED ORDER — IBUPROFEN 800 MG PO TABS
800.0000 mg | ORAL_TABLET | Freq: Three times a day (TID) | ORAL | 0 refills | Status: DC | PRN
Start: 2020-01-11 — End: 2021-02-22

## 2020-01-11 MED ORDER — TRAMADOL HCL 50 MG PO TABS: 50.0000 mg | ORAL_TABLET | Freq: Four times a day (QID) | ORAL | 0 refills | Status: DC | PRN

## 2020-01-11 MED ORDER — FLUORESCEIN SODIUM 1 MG OP STRP
ORAL_STRIP | OPHTHALMIC | Status: AC
  Filled 2020-01-11: qty 1

## 2020-01-11 MED ORDER — BACITRACIN ZINC 500 UNIT/GM EX OINT: TOPICAL_OINTMENT | Freq: Two times a day (BID) | CUTANEOUS | Status: DC
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
## 2020-01-11 NOTE — ED Notes (Signed)
Pt complains of left upper elbow pain, md notified, order for xray received.

## 2020-01-11 NOTE — ED Notes (Signed)
Family at bedside. 
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
## 2020-01-11 NOTE — ED Notes (Signed)
Additional warm blankets provided

## 2020-01-12 ENCOUNTER — Encounter: Payer: Self-pay | Admitting: Family Medicine

## 2020-01-15 ENCOUNTER — Encounter: Payer: Self-pay | Admitting: Family Medicine

## 2020-01-15 ENCOUNTER — Other Ambulatory Visit: Payer: Self-pay

## 2020-01-15 ENCOUNTER — Ambulatory Visit (INDEPENDENT_AMBULATORY_CARE_PROVIDER_SITE_OTHER): Payer: Self-pay | Admitting: Family Medicine

## 2020-01-15 DIAGNOSIS — S0181XD Laceration without foreign body of other part of head, subsequent encounter: Secondary | ICD-10-CM

## 2020-01-15 DIAGNOSIS — M542 Cervicalgia: Secondary | ICD-10-CM

## 2020-01-15 DIAGNOSIS — S8001XD Contusion of right knee, subsequent encounter: Secondary | ICD-10-CM

## 2020-01-15 DIAGNOSIS — K449 Diaphragmatic hernia without obstruction or gangrene: Secondary | ICD-10-CM | POA: Insufficient documentation

## 2020-01-15 DIAGNOSIS — M545 Low back pain, unspecified: Secondary | ICD-10-CM

## 2020-01-15 DIAGNOSIS — K769 Liver disease, unspecified: Secondary | ICD-10-CM | POA: Insufficient documentation

## 2020-01-15 DIAGNOSIS — G44319 Acute post-traumatic headache, not intractable: Secondary | ICD-10-CM

## 2020-01-15 DIAGNOSIS — S060X0D Concussion without loss of consciousness, subsequent encounter: Secondary | ICD-10-CM

## 2020-01-15 MED ORDER — METAXALONE 800 MG PO TABS: 800.0000 mg | ORAL_TABLET | Freq: Three times a day (TID) | ORAL | 0 refills | Status: DC

## 2020-01-15 MED ORDER — AMITRIPTYLINE HCL 25 MG PO TABS: 25.0000 mg | ORAL_TABLET | Freq: Every day | ORAL | 0 refills | Status: DC
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
## 2020-01-28 ENCOUNTER — Other Ambulatory Visit: Payer: Self-pay | Admitting: Family Medicine

## 2020-01-28 ENCOUNTER — Telehealth: Payer: Self-pay | Admitting: Emergency Medicine

## 2020-01-28 DIAGNOSIS — M545 Low back pain, unspecified: Secondary | ICD-10-CM

## 2020-01-28 MED ORDER — BACLOFEN 20 MG PO TABS: 20.0000 mg | ORAL_TABLET | Freq: Three times a day (TID) | ORAL | 0 refills | Status: DC

## 2020-01-28 NOTE — Telephone Encounter (Signed)
Need a new muscle relaxer called in. The skelaxin was to expensive.

## 2020-02-13 ENCOUNTER — Ambulatory Visit (INDEPENDENT_AMBULATORY_CARE_PROVIDER_SITE_OTHER): Payer: Self-pay | Admitting: Family Medicine

## 2020-02-13 ENCOUNTER — Encounter: Payer: Self-pay | Admitting: Family Medicine

## 2020-02-13 DIAGNOSIS — G44309 Post-traumatic headache, unspecified, not intractable: Secondary | ICD-10-CM

## 2020-02-13 DIAGNOSIS — M542 Cervicalgia: Secondary | ICD-10-CM

## 2020-02-13 DIAGNOSIS — M545 Low back pain, unspecified: Secondary | ICD-10-CM

## 2020-02-13 MED ORDER — BACLOFEN 20 MG PO TABS: 20.0000 mg | ORAL_TABLET | Freq: Three times a day (TID) | ORAL | 0 refills | Status: DC
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
## 2020-03-18 ENCOUNTER — Other Ambulatory Visit: Payer: Self-pay | Admitting: Family Medicine

## 2020-03-18 DIAGNOSIS — Z1239 Encounter for other screening for malignant neoplasm of breast: Secondary | ICD-10-CM

## 2020-03-20 ENCOUNTER — Telehealth: Payer: Self-pay | Admitting: Obstetrics and Gynecology

## 2020-03-20 NOTE — Telephone Encounter (Signed)
Error

## 2020-04-14 NOTE — Patient Instructions (Addendum)
Health Maintenance, Female Adopting a healthy lifestyle and getting preventive care are important in promoting health and wellness. Ask your health care provider about:  The right schedule for you to have regular tests and exams.  Things you can do on your own to prevent diseases and keep yourself healthy. What should I know about diet, weight, and exercise? Eat a healthy diet   Eat a diet that includes plenty of vegetables, fruits, low-fat dairy products, and lean protein.  Do not eat a lot of foods that are high in solid fats, added sugars, or sodium. Maintain a healthy weight Body mass index (BMI) is used to identify weight problems. It estimates body fat based on height and weight. Your health care provider can help determine your BMI and help you achieve or maintain a healthy weight. Get regular exercise Get regular exercise. This is one of the most important things you can do for your health. Most adults should:  Exercise for at least 150 minutes each week. The exercise should increase your heart rate and make you sweat (moderate-intensity exercise).  Do strengthening exercises at least twice a week. This is in addition to the moderate-intensity exercise.  Spend less time sitting. Even light physical activity can be beneficial. Watch cholesterol and blood lipids Have your blood tested for lipids and cholesterol at 43 years of age, then have this test every 5 years. Have your cholesterol levels checked more often if:  Your lipid or cholesterol levels are high.  You are older than 43 years of age.  You are at high risk for heart disease. What should I know about cancer screening? Depending on your health history and family history, you may need to have cancer screening at various ages. This may include screening for:  Breast cancer.  Cervical cancer.  Colorectal cancer.  Skin cancer.  Lung cancer. What should I know about heart disease, diabetes, and high blood  pressure? Blood pressure and heart disease  High blood pressure causes heart disease and increases the risk of stroke. This is more likely to develop in people who have high blood pressure readings, are of African descent, or are overweight.  Have your blood pressure checked: ? Every 3-5 years if you are 18-39 years of age. ? Every year if you are 40 years old or older. Diabetes Have regular diabetes screenings. This checks your fasting blood sugar level. Have the screening done:  Once every three years after age 40 if you are at a normal weight and have a low risk for diabetes.  More often and at a younger age if you are overweight or have a high risk for diabetes. What should I know about preventing infection? Hepatitis B If you have a higher risk for hepatitis B, you should be screened for this virus. Talk with your health care provider to find out if you are at risk for hepatitis B infection. Hepatitis C Testing is recommended for:  Everyone born from 1945 through 1965.  Anyone with known risk factors for hepatitis C. Sexually transmitted infections (STIs)  Get screened for STIs, including gonorrhea and chlamydia, if: ? You are sexually active and are younger than 43 years of age. ? You are older than 43 years of age and your health care provider tells you that you are at risk for this type of infection. ? Your sexual activity has changed since you were last screened, and you are at increased risk for chlamydia or gonorrhea. Ask your health care provider if   you are at risk.  Ask your health care provider about whether you are at high risk for HIV. Your health care provider may recommend a prescription medicine to help prevent HIV infection. If you choose to take medicine to prevent HIV, you should first get tested for HIV. You should then be tested every 3 months for as long as you are taking the medicine. Pregnancy  If you are about to stop having your period (premenopausal) and  you may become pregnant, seek counseling before you get pregnant.  Take 400 to 800 micrograms (mcg) of folic acid every day if you become pregnant.  Ask for birth control (contraception) if you want to prevent pregnancy. Osteoporosis and menopause Osteoporosis is a disease in which the bones lose minerals and strength with aging. This can result in bone fractures. If you are 42 years old or older, or if you are at risk for osteoporosis and fractures, ask your health care provider if you should:  Be screened for bone loss.  Take a calcium or vitamin D supplement to lower your risk of fractures.  Be given hormone replacement therapy (HRT) to treat symptoms of menopause. Follow these instructions at home: Lifestyle  Do not use any products that contain nicotine or tobacco, such as cigarettes, e-cigarettes, and chewing tobacco. If you need help quitting, ask your health care provider.  Do not use street drugs.  Do not share needles.  Ask your health care provider for help if you need support or information about quitting drugs. Alcohol use  Do not drink alcohol if: ? Your health care provider tells you not to drink. ? You are pregnant, may be pregnant, or are planning to become pregnant.  If you drink alcohol: ? Limit how much you use to 0-1 drink a day. ? Limit intake if you are breastfeeding.  Be aware of how much alcohol is in your drink. In the [place] one drink equals one 12 oz bottle of beer (355 mL), one 5 oz glass of wine (148 mL), or one 1 oz glass of hard liquor (44 mL). General instructions  Schedule regular health, dental, and eye exams.  Stay current with your vaccines.  Tell your health care provider if: ? You often feel depressed. ? You have ever been abused or do not feel safe at home. Summary  Adopting a healthy lifestyle and getting preventive care are important in promoting health and wellness.  Follow your health care provider's instructions about healthy  diet, exercising, and getting tested or screened for diseases.  Follow your health care provider's instructions on monitoring your cholesterol and blood pressure. This information is not intended to replace advice given to you by your health care provider. Make sure you discuss any questions you have with your health care provider. Document Revised: 08/01/2018 Document Reviewed: 08/01/2018 Elsevier Patient Education  [place].  Dizziness Dizziness is a common problem. It makes you feel unsteady or light-headed. You may feel like you are about to pass out (faint). Dizziness can lead to getting hurt if you stumble or fall. Dizziness can be caused by many things, including:  Medicines.  Not having enough water in your body (dehydration).  Illness. Follow these instructions at home: Eating and drinking   Drink enough fluid to keep your pee (urine) clear or pale yellow. This helps to keep you from getting dehydrated. Try to drink more clear fluids, such as water.  Do not drink alcohol.  Limit how much caffeine you drink or  eat, if your doctor tells you to do that.  Limit how much salt (sodium) you drink or eat, if your doctor tells you to do that. Activity   Avoid making quick movements. ? When you stand up from sitting in a chair, steady yourself until you feel okay. ? In the morning, first sit up on the side of the bed. When you feel okay, stand slowly while you hold onto something. Do this until you know that your balance is fine.  If you need to stand in one place for a long time, move your legs often. Tighten and relax the muscles in your legs while you are standing.  Do not drive or use heavy machinery if you feel dizzy.  Avoid bending down if you feel dizzy. Place items in your home so you can reach them easily without leaning over. Lifestyle  Do not use any products that contain nicotine or tobacco, such as cigarettes and e-cigarettes. If you need help quitting,  ask your doctor.  Try to lower your stress level. You can do this by using methods such as yoga or meditation. Talk with your doctor if you need help. General instructions  Watch your dizziness for any changes.  Take over-the-counter and prescription medicines only as told by your doctor. Talk with your doctor if you think that you are dizzy because of a medicine that you are taking.  Tell a friend or a family member that you are feeling dizzy. If he or she notices any changes in your behavior, have this person call your doctor.  Keep all follow-up visits as told by your doctor. This is important. Contact a doctor if:  Your dizziness does not go away.  Your dizziness or light-headedness gets worse.  You feel sick to your stomach (nauseous).  You have trouble hearing.  You have new symptoms.  You are unsteady on your feet.  You feel like the room is spinning. Get help right away if:  You throw up (vomit) or have watery poop (diarrhea), and you cannot eat or drink anything.  You have trouble: ? Talking. ? Walking. ? Swallowing. ? Using your arms, hands, or legs.  You feel generally weak.  You are not thinking clearly, or you have trouble forming sentences. A friend or family member may notice this.  You have: ? Chest pain. ? Pain in your belly (abdomen). ? Shortness of breath. ? Sweating.  Your vision changes.  You are bleeding.  You have a very bad headache.  You have neck pain or a stiff neck.  You have a fever. These symptoms may be an emergency. Do not wait to see if the symptoms will go away. Get medical help right away. Call your local emergency services (911 in the [place] Do not drive yourself to the hospital. Summary  Dizziness makes you feel unsteady or light-headed. You may feel like you are about to pass out (faint).  Drink enough fluid to keep your pee (urine) clear or pale yellow. Do not drink alcohol.  Avoid making quick movements if you feel  dizzy.  Watch your dizziness for any changes. This information is not intended to replace advice given to you by your health care provider. Make sure you discuss any questions you have with your health care provider. Document Revised: 08/11/2017 Document Reviewed: 08/25/2016 Elsevier Patient Education  [place].

## 2020-04-15 ENCOUNTER — Telehealth (INDEPENDENT_AMBULATORY_CARE_PROVIDER_SITE_OTHER): Payer: Self-pay | Admitting: Family Medicine

## 2020-04-15 ENCOUNTER — Encounter: Payer: Self-pay | Admitting: Family Medicine

## 2020-04-15 ENCOUNTER — Other Ambulatory Visit: Payer: Self-pay | Admitting: Family Medicine

## 2020-04-15 ENCOUNTER — Other Ambulatory Visit: Payer: Self-pay

## 2020-04-15 DIAGNOSIS — G8929 Other chronic pain: Secondary | ICD-10-CM

## 2020-04-15 DIAGNOSIS — R42 Dizziness and giddiness: Secondary | ICD-10-CM

## 2020-04-15 DIAGNOSIS — G44309 Post-traumatic headache, unspecified, not intractable: Secondary | ICD-10-CM

## 2020-04-15 DIAGNOSIS — E786 Lipoprotein deficiency: Secondary | ICD-10-CM

## 2020-04-15 DIAGNOSIS — R7303 Prediabetes: Secondary | ICD-10-CM

## 2020-04-15 DIAGNOSIS — D500 Iron deficiency anemia secondary to blood loss (chronic): Secondary | ICD-10-CM

## 2020-04-15 DIAGNOSIS — M542 Cervicalgia: Secondary | ICD-10-CM

## 2020-04-15 DIAGNOSIS — M545 Low back pain, unspecified: Secondary | ICD-10-CM

## 2020-04-15 DIAGNOSIS — E559 Vitamin D deficiency, unspecified: Secondary | ICD-10-CM

## 2020-04-15 DIAGNOSIS — E538 Deficiency of other specified B group vitamins: Secondary | ICD-10-CM
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
## 2020-08-25 ENCOUNTER — Encounter: Payer: Self-pay | Admitting: Family Medicine

## 2020-08-25 ENCOUNTER — Other Ambulatory Visit: Payer: Self-pay

## 2020-08-25 ENCOUNTER — Telehealth (INDEPENDENT_AMBULATORY_CARE_PROVIDER_SITE_OTHER): Payer: Self-pay | Admitting: Family Medicine

## 2020-08-25 VITALS — Ht 65.0 in | Wt 228.0 lb

## 2020-08-25 DIAGNOSIS — R0981 Nasal congestion: Secondary | ICD-10-CM

## 2020-08-25 DIAGNOSIS — R059 Cough, unspecified: Secondary | ICD-10-CM

## 2020-08-25 DIAGNOSIS — R0982 Postnasal drip: Secondary | ICD-10-CM

## 2020-08-25 MED ORDER — BENZONATATE 100 MG PO CAPS: 100.0000 mg | ORAL_CAPSULE | Freq: Two times a day (BID) | ORAL | 0 refills | Status: DC | PRN

## 2020-08-25 NOTE — Progress Notes (Signed)
Name: Sharon Herring   MRN: [id number]    DOB: 1975/11/07   Date:08/25/2020       Progress Note  Subjective  Chief Complaint  Chief Complaint  Patient presents with  . Cough  . Nasal Congestion    X4 days    I connected with  [name]  on 08/25/20 at  3:40 PM EST by a video enabled telemedicine application and verified that I am speaking with the correct person using two identifiers.  I discussed the limitations of evaluation and management by telemedicine and the availability of in person appointments. The patient expressed understanding and agreed to proceed with the virtual visit  Staff also discussed with the patient that there may be a patient responsible charge related to this service. Patient Location: home Provider Location: [facility] Additional Individuals present: husband   HPI  URI: symptoms started with a dry cough four days ago, followed by nasal congestion and rhinorrhea. She has been taking Terraflu. She states now she has post-nasal drainage and continues to have a cough. She vomited a few times after coughing spells. No fatigue, chills or fever. Denies lack of sense of taste or smell. No SOB. She states her mother has been coughing and wheezing. She does not have a pulse oximeter. She did not get vaccinated for COVID-19, she did get flu vaccine   Patient Active Problem List   Diagnosis Date Noted  . Lightheadedness 04/15/2020  . Liver lesion 01/15/2020  . Hiatal hernia 01/15/2020  . Lymphadenopathy 01/16/2018  . Moderate obstructive sleep apnea 12/19/2017  . Swelling of limb 01/27/2017  . B12 deficiency 03/28/2016  . Vitamin D deficiency 03/28/2016  . Hyperglycemia 03/28/2016  . Multiple gastric polyps 01/08/2016  . Menorrhagia 11/13/2015  . Chronic constipation 09/23/2015  . Obesity, Class I, BMI 30.0-34.9 (see actual BMI) 06/02/2015  . Iron deficiency anemia 03/14/2014  . History of uterine fibroid 03/14/2014    Past Surgical History:  Procedure  Laterality Date  . APPENDECTOMY  2012  . CHOLECYSTECTOMY    . ESOPHAGOGASTRODUODENOSCOPY (EGD) WITH PROPOFOL N/A 01/06/2016   Procedure: ESOPHAGOGASTRODUODENOSCOPY (EGD) WITH PROPOFOL;  Surgeon: Robert Bellow, MD;  Location: [facility];  Service: Endoscopy;  Laterality: N/A;  . ROBOTIC ASSISTED LAPAROSCOPIC CHOLECYSTECTOMY N/A 03/03/2016   Procedure: ROBOTIC ASSISTED LAPAROSCOPIC CHOLECYSTECTOMY ;  Surgeon: Clayburn Pert, MD;  Location: [facility] ORS;  Service: General;  Laterality: N/A;  . UTERINE FIBROID SURGERY  2012  . XI ROBOTIC ASSISTED VENTRAL HERNIA N/A 07/15/2019   Procedure: XI ROBOTIC ASSISTED VENTRAL HERNIA;  Surgeon: Jules Husbands, MD;  Location: [facility] ORS;  Service: General;  Laterality: N/A;    Family History  Problem Relation Age of Onset  . Hypertension Mother   . Hypertension Father   . Pancreatic cancer Father 66  . Breast cancer Neg Hx   . Colon cancer Neg Hx     Social History   Socioeconomic History  . Marital status: Single    Spouse name: Not on file  . Number of children: 0  . Years of education: Not on file  . Highest education level: Bachelor's degree (e.g., BA, AB, BS)  Occupational History  . Occupation: CMA  Tobacco Use  . Smoking status: Never Smoker  . Smokeless tobacco: Never Used  Vaping Use  . Vaping Use: Never used  Substance and Sexual Activity  . Alcohol use: No    Alcohol/week: 0.0 standard drinks  . Drug use: No  . Sexual activity: Not on  file  Other Topics Concern  . Not on file  Social History Narrative   Lives alone, works at [employer] Strain: [place]   . Difficulty of Paying Living Expenses: Not hard at all  Food Insecurity: No Food Insecurity  . Worried About Charity fundraiser in the Last Year: Never true  . Ran Out of Food in the Last Year: Never true  Transportation Needs: No Transportation Needs  . Lack of Transportation (Medical): No  . Lack of  Transportation (Non-Medical): No  Physical Activity: Insufficiently Active  . Days of Exercise per Week: 7 days  . Minutes of Exercise per Session: 20 min  Stress: No Stress Concern Present  . Feeling of Stress : Not at all  Social Connections: Moderately Integrated  . Frequency of Communication with Friends and Family: More than three times a week  . Frequency of Social Gatherings with Friends and Family: More than three times a week  . Attends Religious Services: More than 4 times per year  . Active Member of Clubs or Organizations: Yes  . Attends Archivist Meetings: More than 4 times per year  . Marital Status: Never married  Intimate Partner Violence: Not At Risk  . Fear of Current or Ex-Partner: No  . Emotionally Abused: No  . Physically Abused: No  . Sexually Abused: No     Current Outpatient Medications:  .  albuterol (PROVENTIL) (2.5 MG/3ML) 0.083% nebulizer solution, Take 3 mLs (2.5 mg total) by nebulization every 6 (six) hours as needed for wheezing or shortness of breath., Disp: 150 mL, Rfl: 1 .  albuterol (VENTOLIN HFA) 108 (90 Base) MCG/ACT inhaler, Inhale 2 puffs into the lungs every 6 (six) hours as needed for wheezing or shortness of breath., Disp: 18 g, Rfl: 0 .  amitriptyline (ELAVIL) 25 MG tablet, Take 1 tablet (25 mg total) by mouth at bedtime., Disp: 90 tablet, Rfl: 0 .  baclofen (LIORESAL) 20 MG tablet, Take 1 tablet (20 mg total) by mouth 3 (three) times daily., Disp: 90 each, Rfl: 0 .  cyclobenzaprine (FLEXERIL) 10 MG tablet, Take 1 tablet (10 mg total) by mouth 3 (three) times daily as needed for muscle spasms., Disp: 30 tablet, Rfl: 0 .  fluticasone (FLONASE) 50 MCG/ACT nasal spray, Place 2 sprays into both nostrils daily. (Patient taking differently: Place 2 sprays into both nostrils daily as needed for allergies.), Disp: 16 g, Rfl: 6 .  furosemide (LASIX) 20 MG tablet, Take 1 tablet (20 mg total) by mouth daily. (Patient taking differently: Take 20  mg by mouth daily as needed for fluid.), Disp: 30 tablet, Rfl: 1 .  HYDROcodone-acetaminophen (NORCO/VICODIN) 5-325 MG tablet, TAKE 1 TABLET BY MOUTH EVERY 6 HOURS AS NEEDED FOR MODERATE PAIN, Disp: , Rfl:  .  ibuprofen (ADVIL) 800 MG tablet, Take 1 tablet (800 mg total) by mouth every 8 (eight) hours as needed., Disp: 30 tablet, Rfl: 0 .  iron polysaccharides (NIFEREX) 150 MG capsule, Take by mouth., Disp: , Rfl:  .  Iron-FA-B Cmp-C-Biot-Probiotic (FUSION PLUS) CAPS, Take 1 capsule by mouth daily., Disp: , Rfl:  .  linaclotide (LINZESS) 290 MCG CAPS capsule, Take 1 capsule (290 mcg total) by mouth daily before breakfast., Disp: 30 capsule, Rfl: 2 .  Multiple Vitamins-Minerals (MULTIVITAMIN ADULT) TABS, Take 1 tablet by mouth daily., Disp: 30 tablet, Rfl: 0 .  norethindrone (AYGESTIN) 5 MG tablet, Take 1 tablet (5 mg total)  by mouth daily., Disp: 30 tablet, Rfl: 2 .  topiramate (TOPAMAX) 25 MG tablet, Take by mouth., Disp: , Rfl:  .  traMADol (ULTRAM) 50 MG tablet, Take 1 tablet (50 mg total) by mouth every 6 (six) hours as needed., Disp: 10 tablet, Rfl: 0 .  Vitamin D, Ergocalciferol, (DRISDOL) 1.25 MG (50000 UNIT) CAPS capsule, Take 1 capsule (50,000 Units total) by mouth every 7 (seven) days., Disp: 12 capsule, Rfl: 1  Current Facility-Administered Medications:  .  cyanocobalamin ((VITAMIN B-12)) injection 1,000 mcg, 1,000 mcg, Intramuscular, Once, Alba Cory, MD  Allergies  Allergen Reactions  . Aspirin Nausea And Vomiting  . Oxycodone-Acetaminophen Rash and Shortness Of Breath  . Lexapro [Escitalopram] Palpitations  . Tape Other (See Comments)    Burning feeling. Has left mark on skin.  Sharyn Creamer [Lorcaserin Hcl] Other (See Comments)    headache  . Contrave [Naltrexone-Bupropion Hcl Er]   . Dicyclomine Nausea And Vomiting  . Norethindrone Rash    I personally reviewed active problem list, medication list, allergies, family history, social history, health maintenance with the  patient/caregiver today.   ROS  Ten systems reviewed and is negative except as mentioned in HPI   Objective  Virtual encounter, vitals not obtained.  Body mass index is 37.94 kg/m.  Physical Exam  Awake, alert and oriented   PHQ2/9: Depression screen [facility] 2/9 08/25/2020 04/15/2020 02/13/2020 01/15/2020 09/16/2019  Decreased Interest 0 1 0 0 0  Down, Depressed, Hopeless 0 1 0 0 0  PHQ - 2 Score 0 2 0 0 0  Altered sleeping - 1 0 0 0  Tired, decreased energy - 3 0 0 0  Change in appetite - 1 0 0 0  Feeling bad or failure about yourself  - 0 0 0 0  Trouble concentrating - 0 0 0 0  Moving slowly or fidgety/restless - 0 0 0 0  Suicidal thoughts - 0 0 0 0  PHQ-9 Score - 7 0 0 0  Difficult doing work/chores - Somewhat difficult - Not difficult at all -  Some recent data might be hidden   PHQ-2/9 Result is negative.    Fall Risk: Fall Risk  08/25/2020 04/15/2020 02/13/2020 01/15/2020 09/16/2019  Falls in the past year? 0 - 0 0 0  Number falls in past yr: 0 1 0 0 0  Injury with Fall? 0 0 0 0 0  Risk for fall due to : - History of fall(s);Impaired balance/gait - - -  Follow up - - - Falls evaluation completed -    Assessment & Plan  1. Cough  - benzonatate (TESSALON) 100 MG capsule; Take 1-2 capsules (100-200 mg total) by mouth 2 (two) times daily as needed.  Dispense: 40 capsule; Refill: 0 - Novel Coronavirus, NAA ([facility])  Discussed self isolation until results back, purchase pulse oximeter and go to [facility] if below 92 %, rest, fluids, nasal saline, mucinex and take tessalon perles for cough, avoid sedating medications  2. Head congestion  - Novel Coronavirus, NAA ([facility])  3. Post-nasal drainage  - Novel Coronavirus, NAA ([facility])  I discussed the assessment and treatment plan with the patient. The patient was provided an opportunity to ask questions and all were answered. The patient agreed with the plan and demonstrated an understanding of the instructions.  The patient  was advised to call back or seek an in-person evaluation if the symptoms worsen or if the condition fails to improve as anticipated.  I provided  minutes of non-face-to-face time  during this encounter.

## 2020-08-27 ENCOUNTER — Ambulatory Visit: Payer: 59

## 2020-08-28 ENCOUNTER — Ambulatory Visit: Payer: No Typology Code available for payment source | Attending: Internal Medicine

## 2020-08-28 DIAGNOSIS — Z23 Encounter for immunization: Secondary | ICD-10-CM

## 2020-08-28 LAB — NOVEL CORONAVIRUS, NAA: SARS-CoV-2, NAA: NOT DETECTED

## 2020-08-28 LAB — SARS-COV-2, NAA 2 DAY TAT

## 2020-08-28 NOTE — Progress Notes (Signed)
   Covid-19 Vaccination Clinic  Name:  Sharon Herring    MRN: [id number] DOB: 1976/01/20  08/28/2020  [name] was observed post Covid-19 immunization for 15 minutes without incident. She was provided with Vaccine Information Sheet and instruction to access the V-Safe system.   [name] was instructed to call 911 with any severe reactions post vaccine: Marland Kitchen Difficulty breathing  . Swelling of face and throat  . A fast heartbeat  . A bad rash all over body  . Dizziness and weakness   Immunizations Administered    Name Date Dose VIS Date Route   Pfizer COVID-19 Vaccine 08/28/2020  1:34 PM 0.3 mL 06/10/2020 Intramuscular   Manufacturer: [place]   Lot: [id number]   NDC: [address]-5

## 2020-08-31 ENCOUNTER — Telehealth: Payer: Self-pay

## 2020-08-31 NOTE — Telephone Encounter (Signed)
Pt called stating she feels worst today. Her Covid test was negative. She states she is having a lot of cough and the tessalon pearls are not helping. Pt is asking if something can be called in.

## 2020-09-02 ENCOUNTER — Other Ambulatory Visit: Payer: Self-pay | Admitting: Emergency Medicine

## 2020-09-02 ENCOUNTER — Encounter: Payer: Self-pay | Admitting: Family Medicine

## 2020-09-02 DIAGNOSIS — R0602 Shortness of breath: Secondary | ICD-10-CM

## 2020-09-02 DIAGNOSIS — R059 Cough, unspecified: Secondary | ICD-10-CM

## 2020-09-02 MED ORDER — ALBUTEROL SULFATE HFA 108 (90 BASE) MCG/ACT IN AERS: 2.0000 | INHALATION_SPRAY | Freq: Four times a day (QID) | RESPIRATORY_TRACT | 0 refills | Status: DC | PRN

## 2020-09-14 ENCOUNTER — Ambulatory Visit: Payer: Self-pay | Admitting: Family Medicine

## 2020-09-21 ENCOUNTER — Ambulatory Visit: Payer: Self-pay

## 2020-10-21 ENCOUNTER — Other Ambulatory Visit: Payer: Self-pay

## 2020-10-21 ENCOUNTER — Encounter: Payer: Self-pay | Admitting: Emergency Medicine

## 2020-10-21 ENCOUNTER — Emergency Department
Admission: EM | Admit: 2020-10-21 | Discharge: 2020-10-21 | Disposition: A | Discharge status: Home / Self Care | Payer: 59 | Attending: Emergency Medicine | Admitting: Emergency Medicine

## 2020-10-21 ENCOUNTER — Ambulatory Visit
Admission: EM | Admit: 2020-10-21 | Discharge: 2020-10-21 | Disposition: A | Discharge status: Other Acute Inpatient Hospital | Payer: 59

## 2020-10-21 DIAGNOSIS — N939 Abnormal uterine and vaginal bleeding, unspecified: Secondary | ICD-10-CM | POA: Diagnosis not present

## 2020-10-21 DIAGNOSIS — Z5321 Procedure and treatment not carried out due to patient leaving prior to being seen by health care provider: Secondary | ICD-10-CM | POA: Insufficient documentation

## 2020-10-21 LAB — BASIC METABOLIC PANEL
Anion gap: 6 (ref 5–15)
BUN: 10 mg/dL (ref 6–20)
CO2: 23 mmol/L (ref 22–32)
Calcium: 8.8 mg/dL — ABNORMAL LOW (ref 8.9–10.3)
Chloride: 107 mmol/L (ref 98–111)
Creatinine, Ser: 0.67 mg/dL (ref 0.44–1.00)
GFR, Estimated: >60 mL/min (ref >60)
Glucose, Bld: 127 mg/dL — ABNORMAL HIGH (ref 70–99)
Potassium: 3.5 mmol/L (ref 3.5–5.1)
Sodium: 136 mmol/L (ref 135–145)

## 2020-10-21 LAB — CBC
Hemoglobin: 5.3 g/dL — ABNORMAL LOW (ref 12.0–15.0)
Platelets: 493 10*3/uL — ABNORMAL HIGH (ref 150–400)
WBC: 6.1 10*3/uL (ref 4.0–10.5)

## 2020-10-21 NOTE — ED Provider Notes (Signed)
[facility]    CSN: [id number] Arrival date & time: 10/21/20  1652      History   Chief Complaint No chief complaint on file.   HPI Sharon Herring is a 45 y.o. female.   HPI   45 year old female here for evaluation of fatigue and menstrual issues.  Patient reports that her menses started yesterday and today she has been through 16 saturated pads since 10 AM (7 hours) to include clots.  Patient reports that she is having crampy pain in her lower abdomen.  Patient reports that her pain and bleeding have never been this heavy.  Patient does have a history of iron deficiency anemia and B 12 deficiency secondary to chronic blood loss from uterine fibroids.  Patient reports that she had her fibroids removed 11 years ago.  Patient endorses that she has also had some shortness of breath but she denies syncope or chest pain.  Past Medical History:  Diagnosis Date  . Anemia   . Dizziness   . Fibroid uterus   . GERD (gastroesophageal reflux disease)   . IBS (irritable bowel syndrome)   . Migraine   . Polyp of stomach 01/06/2016   Stomach polyp, pyloric, consistent with prolapse.     Patient Active Problem List   Diagnosis Date Noted  . Lightheadedness 04/15/2020  . Liver lesion 01/15/2020  . Hiatal hernia 01/15/2020  . Lymphadenopathy 01/16/2018  . Moderate obstructive sleep apnea 12/19/2017  . Swelling of limb 01/27/2017  . B12 deficiency 03/28/2016  . Vitamin D deficiency 03/28/2016  . Hyperglycemia 03/28/2016  . Multiple gastric polyps 01/08/2016  . Menorrhagia 11/13/2015  . Chronic constipation 09/23/2015  . Obesity, Class I, BMI 30.0-34.9 (see actual BMI) 06/02/2015  . Iron deficiency anemia 03/14/2014  . History of uterine fibroid 03/14/2014    Past Surgical History:  Procedure Laterality Date  . APPENDECTOMY  2012  . CHOLECYSTECTOMY    . ESOPHAGOGASTRODUODENOSCOPY (EGD) WITH PROPOFOL N/A 01/06/2016   Procedure: ESOPHAGOGASTRODUODENOSCOPY (EGD) WITH  PROPOFOL;  Surgeon: Robert Bellow, MD;  Location: [facility];  Service: Endoscopy;  Laterality: N/A;  . ROBOTIC ASSISTED LAPAROSCOPIC CHOLECYSTECTOMY N/A 03/03/2016   Procedure: ROBOTIC ASSISTED LAPAROSCOPIC CHOLECYSTECTOMY ;  Surgeon: Clayburn Pert, MD;  Location: [facility] ORS;  Service: General;  Laterality: N/A;  . UTERINE FIBROID SURGERY  2012  . XI ROBOTIC ASSISTED VENTRAL HERNIA N/A 07/15/2019   Procedure: XI ROBOTIC ASSISTED VENTRAL HERNIA;  Surgeon: Jules Husbands, MD;  Location: [facility] ORS;  Service: General;  Laterality: N/A;    OB History    Gravida  0   Para  0   Term  0   Preterm  0   AB  0   Living  0     SAB  0   IAB  0   Ectopic  0   Multiple  0   Live Births           Obstetric Comments  1st Menstrual Cycle:  15            Home Medications    Prior to Admission medications   Medication Sig Start Date End Date Taking? Authorizing Provider  albuterol (PROVENTIL) (2.5 MG/3ML) 0.083% nebulizer solution Take 3 mLs (2.5 mg total) by nebulization every 6 (six) hours as needed for wheezing or shortness of breath. 06/20/19   Steele Sizer, MD  albuterol (VENTOLIN HFA) 108 (90 Base) MCG/ACT inhaler Inhale 2 puffs into the lungs every 6 (six) hours as needed for  wheezing or shortness of breath. 09/02/20   Steele Sizer, MD  amitriptyline (ELAVIL) 25 MG tablet Take 1 tablet (25 mg total) by mouth at bedtime. 01/15/20   Steele Sizer, MD  baclofen (LIORESAL) 20 MG tablet Take 1 tablet (20 mg total) by mouth 3 (three) times daily. 02/13/20   Steele Sizer, MD  benzonatate (TESSALON) 100 MG capsule Take 1-2 capsules (100-200 mg total) by mouth 2 (two) times daily as needed. 08/25/20   Steele Sizer, MD  cyclobenzaprine (FLEXERIL) 10 MG tablet Take 1 tablet (10 mg total) by mouth 3 (three) times daily as needed for muscle spasms. 01/11/20   Rudene Re, MD  fluticasone [facility]) 50 MCG/ACT nasal spray Place 2 sprays into both nostrils  daily. Patient taking differently: Place 2 sprays into both nostrils daily as needed for allergies. 08/21/17   Sharion Balloon, FNP  furosemide (LASIX) 20 MG tablet Take 1 tablet (20 mg total) by mouth daily. Patient taking differently: Take 20 mg by mouth daily as needed for fluid. 07/17/18   Steele Sizer, MD  HYDROcodone-acetaminophen (NORCO/VICODIN) 5-325 MG tablet TAKE 1 TABLET BY MOUTH EVERY 6 HOURS AS NEEDED FOR MODERATE PAIN 07/15/19   [provider]  ibuprofen (ADVIL) 800 MG tablet Take 1 tablet (800 mg total) by mouth every 8 (eight) hours as needed. 01/11/20   Rudene Re, MD  iron polysaccharides (NIFEREX) 150 MG capsule Take by mouth.    [provider]  Iron-FA-B Cmp-C-Biot-Probiotic (FUSION PLUS) CAPS Take 1 capsule by mouth daily.    [provider]  linaclotide Rolan Lipa) 290 MCG CAPS capsule Take 1 capsule (290 mcg total) by mouth daily before breakfast. 09/16/19   Steele Sizer, MD  Multiple Vitamins-Minerals (MULTIVITAMIN ADULT) TABS Take 1 tablet by mouth daily. 01/04/17   Steele Sizer, MD  norethindrone (AYGESTIN) 5 MG tablet Take 1 tablet (5 mg total) by mouth daily. 10/18/18   Rubie Maid, MD  topiramate (TOPAMAX) 25 MG tablet Take by mouth. 05/26/20   [provider]  traMADol (ULTRAM) 50 MG tablet Take 1 tablet (50 mg total) by mouth every 6 (six) hours as needed. 01/11/20   Harvest Dark, MD  Vitamin D, Ergocalciferol, (DRISDOL) 1.25 MG (50000 UNIT) CAPS capsule Take 1 capsule (50,000 Units total) by mouth every 7 (seven) days. 09/29/19   Steele Sizer, MD    Family History Family History  Problem Relation Age of Onset  . Hypertension Mother   . Hypertension Father   . Pancreatic cancer Father 35  . Breast cancer Neg Hx   . Colon cancer Neg Hx     Social History Social History   Tobacco Use  . Smoking status: Never Smoker  . Smokeless tobacco: Never Used  Vaping Use  . Vaping Use: Never used  Substance  Use Topics  . Alcohol use: No    Alcohol/week: 0.0 standard drinks  . Drug use: No     Allergies   Aspirin, Oxycodone-acetaminophen, Lexapro [escitalopram], Tape, Belviq [lorcaserin hcl], Contrave [naltrexone-bupropion hcl er], Dicyclomine, and Norethindrone   Review of Systems Review of Systems  Constitutional: Positive for fatigue.  Respiratory: Positive for shortness of breath.   Cardiovascular: Negative for chest pain.  Gastrointestinal: Positive for abdominal pain.  Genitourinary: Positive for vaginal bleeding.  Neurological: Negative for syncope.     Physical Exam Triage Vital Signs ED Triage Vitals  Enc Vitals Group     BP      Pulse      Resp  Temp      Temp src      SpO2      Weight      Height      Head Circumference      Peak Flow      Pain Score      Pain Loc      Pain Edu?      Excl. in [place]?    No data found.  Updated Vital Signs There were no vitals taken for this visit.  Visual Acuity Right Eye Distance:   Left Eye Distance:   Bilateral Distance:    Right Eye Near:   Left Eye Near:    Bilateral Near:     Physical Exam Eyes:     General: Scleral icterus present.     Pupils: Pupils are equal, round, and reactive to light.  Skin:    General: Skin is warm and dry.     Coloration: Skin is pale.  Neurological:     General: No focal deficit present.     Mental Status: She is oriented to person, place, and time.  Psychiatric:        Mood and Affect: Mood normal.        Behavior: Behavior normal.        Thought Content: Thought content normal.        Judgment: Judgment normal.      UC Treatments / Results  Labs (all labs ordered are listed, but only abnormal results are displayed) Labs Reviewed - No data to display  EKG   Radiology No results found.  Procedures Procedures (including critical care time)  Medications Ordered in UC Medications - No data to display  Initial Impression / Assessment and Plan / UC Course  I  have reviewed the triage vital signs and the nursing notes.  Pertinent labs & imaging results that were available during my care of the patient were reviewed by me and considered in my medical decision making (see chart for details).   Patient is a very pleasant 45 year old female who presents for evaluation of excessive uterine bleeding.  Patient reports that she is been through 16 saturated pads in the last 7 hours some which have had clots.  Physical exam reveals icteric sclera and pale conjunctiva.  Patient states that she would like to have an ultrasound and have her blood levels checked.  Patient advised that we do not have ultrasound at present and with the amount of blood loss she has experienced she may need a transfusion.  Patient recommended to go to the emergency department for evaluation as we do not have the resources here in urgent care to adequately assess and treat her.  Patient is in agreement and will go to the emergency department via POV.  Patient's husband is driving.   Final Clinical Impressions(s) / UC Diagnoses   Final diagnoses:  Abnormal vaginal bleeding     Discharge Instructions     Please go to the emergency department to have your blood levels checked and be evaluated for the source of your excessive uterine bleeding.    ED Prescriptions    None     PDMP not reviewed this encounter.   Margarette Canada, NP 10/21/20 1724

## 2020-10-21 NOTE — Discharge Instructions (Addendum)
Please go to the emergency department to have your blood levels checked and be evaluated for the source of your excessive uterine bleeding.

## 2020-10-21 NOTE — ED Triage Notes (Signed)
Pt comes into the ED via POV c/o vaginal bleeding.  Pt states the bleeding has been ongoing since yesterday when she started her menstrual cycle.  Pt states the bleeding is heavier than normal and she has severe cramping.  Pt states she has gone through 16 pads today since 10:00am.  Pt states she does feel weaker than normal and at baseline she is anemic.

## 2020-10-22 ENCOUNTER — Ambulatory Visit (INDEPENDENT_AMBULATORY_CARE_PROVIDER_SITE_OTHER): Payer: Self-pay | Admitting: Obstetrics and Gynecology

## 2020-10-22 ENCOUNTER — Encounter: Payer: Self-pay | Admitting: Obstetrics and Gynecology

## 2020-10-22 ENCOUNTER — Other Ambulatory Visit: Payer: Self-pay

## 2020-10-22 ENCOUNTER — Other Ambulatory Visit (HOSPITAL_COMMUNITY)
Admission: RE | Admit: 2020-10-22 | Discharge: 2020-10-22 | Disposition: A | Discharge status: Home / Self Care | Payer: 59 | Source: Ambulatory Visit | Attending: Obstetrics and Gynecology | Admitting: Obstetrics and Gynecology

## 2020-10-22 VITALS — BP 117/77 | HR 92 | Ht 65.0 in | Wt 198.9 lb

## 2020-10-22 DIAGNOSIS — N920 Excessive and frequent menstruation with regular cycle: Secondary | ICD-10-CM

## 2020-10-22 DIAGNOSIS — D500 Iron deficiency anemia secondary to blood loss (chronic): Secondary | ICD-10-CM

## 2020-10-22 DIAGNOSIS — Z86018 Personal history of other benign neoplasm: Secondary | ICD-10-CM

## 2020-10-22 DIAGNOSIS — N946 Dysmenorrhea, unspecified: Secondary | ICD-10-CM

## 2020-10-22 LAB — POCT URINE PREGNANCY: Preg Test, Ur: NEGATIVE

## 2020-10-22 MED ORDER — MEDROXYPROGESTERONE ACETATE 10 MG PO TABS: 10.0000 mg | ORAL_TABLET | Freq: Every day | ORAL | 2 refills | Status: DC

## 2020-10-22 NOTE — Progress Notes (Signed)
Pt present for heavy bleeding. Pt stated having heavy bleeding and used 10 pads yesterday along with mild cramping, weakness and lightheaded. UPT-NEG.

## 2020-10-22 NOTE — Patient Instructions (Signed)
Abnormal Uterine Bleeding  Abnormal uterine bleeding is unusual bleeding from the uterus. It includes bleeding after sex, or bleeding or spotting between menstrual periods. It may also include bleeding that is heavier than normal, menstrual periods that last longer than usual, or bleeding that occurs after menopause. Abnormal uterine bleeding can affect teenagers, women in their reproductive years, pregnant women, and women who have reached menopause. Common causes of abnormal uterine bleeding include:  Pregnancy.  Growths of tissue (polyps).  Benign tumors or growths in the uterus (fibroids). These are not cancer.  Infection.  Cancer.  Too much or too little of some hormones in the body (hormonal imbalances). Any type of abnormal bleeding should be checked by a health care provider. Many cases are minor and simple to treat, but others may be more serious. Treatment will depend on the cause and severity of the bleeding. Follow these instructions at home: Medicines  Take over-the-counter and prescription medicines only as told by your health care provider.  Tell your health care provider about other medicines that you take. You may be asked to stop taking aspirin or medicines that contain aspirin. These medicines can make bleeding worse.  If you were prescribed iron pills, take them as told by your health care provider. Iron pills help to replace iron that your body loses because of this condition. Managing constipation In cases of severe bleeding, you may be asked to increase your iron intake to treat anemia. This may cause constipation. To prevent or treat constipation, you may need to:  Drink enough fluid to keep your urine pale yellow.  Take over-the-counter or prescription medicines.  Eat foods that are high in fiber, such as beans, whole grains, and fresh fruits and vegetables.  Limit foods that are high in fat and processed sugars, such as fried or sweet foods. General  instructions  Monitor your condition for any changes.  Do not use tampons, douche, or have sex until your health care provider says these things are okay.  Change your pads often.  Get regular exams. This includes pelvic exams and cervical cancer screenings. ? It is up to you to get the results of any tests that are done. Ask your health care provider, or the department that is doing the tests, when your results will be ready.  Keep all follow-up visits as told by your health care provider. This is important. Contact a health care provider if you:  Have bleeding that lasts for more than 1 week.  Feel dizzy at times.  Feel nauseous or you vomit.  Feel light-headed or weak.  Notice any other changes that show that your condition is getting worse. Get help right away if you:  Pass out.  Have bleeding that soaks through a pad every hour.  Have pain in the abdomen.  Have a fever or chills.  Become sweaty or weak.  Pass large blood clots from your vagina. Summary  Abnormal uterine bleeding is unusual bleeding from the uterus.  Any type of abnormal bleeding should be evaluated by a health care provider. Many cases are minor and simple to treat, but others may be more serious.  Treatment will depend on the cause of the bleeding.  Get help right away if you pass out, you have bleeding that soaks through a pad every hour, or you pass large blood clots from your vagina. This information is not intended to replace advice given to you by your health care provider. Make sure you discuss any questions you  have with your health care provider. Document Revised: 04/15/2020 Document Reviewed: 06/11/2019 Elsevier Patient Education  [place].

## 2020-10-22 NOTE — Progress Notes (Signed)
GYNECOLOGY PROGRESS NOTE  Subjective:    Patient ID: Sharon Herring, female    DOB: 08-10-1976, 45 y.o.   MRN: [id number]  HPI  Patient is a 45 y.o. G0P0000 female who presents for complaints of worsening uterine bleeding.  She has a history of fibroid uterus, menorrhagia with her cycles, and anemia requiring iron infusion. Notes that most recent cycle was the worst. Her cycle started this past Monday, and by yesterday she reports that her flow became significantly heavier, used almost 16 pads yesterday between 8 a.m. and 5 pm. She states that she began feeling light-headed and dizzy so she went tot the Urgent Care, but then was referred to the Emergency Room due to lack of available services. States that she waited for approximately 6 hours in the waiting room, but left after not being seen as she knew she had an appointment today. Bleeding has lessened today.  Significantly, patient notes taking a pregnant test this week as she thought it could possibly be a miscarriage causing the heavier bleeding. Reports 1 positive and 1 negative test.   Of note, patient in 2020 was at the point of considering surgical intervention with myomectomy, but after having COVID infection, did not follow up.  Notes that she has gotten married since that time, and does still planning on attempting to conceive some time soon.    Gynecologic History:  Last pap smear: 01/22/2019. Patient denies history of abnormal pap smears.  Last mammogram: 01/01/2018. Results: BIRADS 2, Benign.  Contraception: none   Menstrual History:  Patient's last menstrual period was 10/20/2020. Menstrual Flow: Heavy Menstrual Control: Maxi pad Menstrual Control Change Freq (Hours): 1-2 Dysmenorrhea: (!) Severe Dysmenorrhea Symptoms: Cramping,Headache,Other (Comment) (weakness, SOB)   The following portions of the patient's history were reviewed and updated as appropriate: allergies, current medications, past family history, past medical  history, past social history, past surgical history and problem list.  Review of Systems Pertinent items noted in HPI and remainder of comprehensive ROS otherwise negative.   Objective:   Blood pressure 117/77, pulse 92, height 5\' 5"  (1.651 m), weight 198 lb 14.4 oz (90.2 kg), last menstrual period 10/20/2020. General appearance: alert and no distress Abdomen: soft, non-tender; bowel sounds normal; no masses,  no organomegaly Pelvic: external genitalia normal, rectovaginal septum normal.  Vagina with moderate dark red blood in vault.  No discharge.  Cervix normal appearing, no lesions and no motion tenderness.  Uterus mobile, nontender, upper limits of normal size ~ 12 cm. shape and size.  Adnexae non-palpable, nontender bilaterally.  Extremities: extremities normal, atraumatic, no cyanosis or edema Neurologic: Grossly normal    Labs:  Lab Results  Component Value Date   WBC 6.1 10/21/2020   HGB 5.3 (L) 10/21/2020   HCT RESULTS UNAVAILABLE DUE TO INTERFERING SUBSTANCE 10/21/2020   MCV RESULTS UNAVAILABLE DUE TO INTERFERING SUBSTANCE 10/21/2020   PLT 493 (H) 10/21/2020     CMP Latest Ref Rng & Units 10/21/2020  Glucose 70 - 99 mg/dL 127(H)  BUN 6 - 20 mg/dL 10  Creatinine 0.44 - 1.00 mg/dL 0.67  Sodium 135 - 145 mmol/L 136  Potassium 3.5 - 5.1 mmol/L 3.5  Chloride 98 - 111 mmol/L 107  CO2 22 - 32 mmol/L 23  Calcium 8.9 - 10.3 mg/dL 8.8(L)  Total Protein 6.0 - 8.5 g/dL -  Total Bilirubin 0.0 - 1.2 mg/dL -  Alkaline Phos 39 - 117 IU/L -  AST 0 - 40 IU/L -  ALT 0 -  32 IU/L -    Lab Results  Component Value Date   TSH 2.760 09/25/2019     Imaging:  Patient Name: Sharon Herring DOB: 1976-06-05 MRN: [id number]    ULTRASOUND REPORT  Location: [facility]  Date of Service: 11/07/2018    Indications:Fibroids Findings:  The uterus is anteverted and measures 127.9 mm. Echo texture is homogenous with evidence of focal masses. Within the uterus are multiple  suspected fibroids measuring: Fibroid 1:35.9 x 35.9 mm posterior Fibroid 2:42.6 x42.6 mm anterior Fibroid 3: 36.0 x 36.0 mm fundal The Endometrium measures 8  mm.  Right Ovary measures 1.39 x 1.53 x .9 cm. It is normal in appearance.  Left Ovary measures 2.38 x 1.41 x .1.58 cm. It is normal in appearance. Survey of the adnexa demonstrates no adnexal masses. There is no free fluid in the cul de sac.   Impression: The uterus is anteverted and measures 127.9 mm. Echo texture is homogenous with evidence of focal masses. Within the uterus are multiple suspected fibroids measuring: Fibroid 1:35.9 x 35.9 mm posterior Fibroid 2:42.6 x42.6 mm anterior Fibroid 3: 36.0 x 36.0 mm fundal   Recommendations: 1.Clinical correlation with the patient's History and Physical Exam.   Lillia Dallas  RDMS   I have reviewed this study and agree with documented findings.    Rubie Maid, MD [facility]   Assessment:   1. Menorrhagia with regular cycle   2. History of uterine fibroid   3. Iron deficiency anemia due to chronic blood loss   4. Dysmenorrhea      Plan:   1. Menorrhagia with regular cycle - patient with excessive flow with this month's cycle, causing symptoms.  Has lightened in flow some compared to yesterday.  Has a h/o fibroid uterus. Discussed management options again with patient, including hormonal suppression vs surgical management.  Patient has had an allergic reaction to Aygestin in the past. Will attempt to prescribe Provera for 10 days each month, with 2 refills. Still ultimately desires fertility  Sparing surgery as she still desires to conceive.  2. Will recheck labs. Review of labs from ER visit notes a Hgb of 5.3, however also noting contaminating substance so remainder of lab couldn't be resulted. Will repeat to verify today.  If this value is true, advised patient that she would likely need to be admitted to the infusion center to receive a  blood transfusion.  3. Will order ultrasound to assess for potential growth of fibroid in 1 week.  4. Discussed that due to DUB, will require an endometrial biopsy to rule out hyperplasia or malignancy.   Procedure done today (see below).  5. Patient to f/u in 2-3 weeks.    Endometrial Biopsy Procedure Note  The patient is positioned on the exam table in the dorsal lithotomy position. Bimanual exam confirms uterine position and size. A Graves speculum is placed into the vagina. A single toothed tenaculum is placed onto the anterior lip of the cervix. The pipette is placed into the endocervical canal and is advanced to the uterine fundus. Using a piston like technique, with vacuum created by withdrawing the stylus, the endometrial specimen is obtained and transferred to the biopsy container. Minimal bleeding is encountered. The procedure is well tolerated.   Uterine Position: mid    Uterine Length:10 cm   Uterine Specimen  Post procedure instructions are given. The patient is scheduled for follow up appointment.   A total of 25 minutes were spent face-to-face with  the patient during this encounter and over half of that time involved counseling and coordination of care.   Rubie Maid, MD [facility]

## 2020-10-23 LAB — CBC
Hematocrit: 19.2 % — ABNORMAL LOW (ref 34.0–46.6)
Hemoglobin: 5.1 g/dL — CL (ref 11.1–15.9)
MCH: 15.8 pg — ABNORMAL LOW (ref 26.6–33.0)
MCHC: 26.6 g/dL — ABNORMAL LOW (ref 31.5–35.7)
MCV: 59 fL — ABNORMAL LOW (ref 79–97)
Platelets: 568 10*3/uL — ABNORMAL HIGH (ref 150–450)
RBC: 3.23 x10E6/uL — ABNORMAL LOW (ref 3.77–5.28)
RDW: 26.7 % — ABNORMAL HIGH (ref 11.7–15.4)
WBC: 5.4 10*3/uL (ref 3.4–10.8)

## 2020-10-23 LAB — TSH: TSH: 2.76 u[IU]/mL (ref 0.450–4.500)

## 2020-10-26 ENCOUNTER — Other Ambulatory Visit: Payer: Self-pay | Admitting: Obstetrics and Gynecology

## 2020-10-26 ENCOUNTER — Ambulatory Visit
Admission: RE | Admit: 2020-10-26 | Discharge: 2020-10-26 | Disposition: A | Discharge status: Home / Self Care | Payer: 59 | Source: Ambulatory Visit | Attending: Obstetrics and Gynecology | Admitting: Obstetrics and Gynecology

## 2020-10-26 ENCOUNTER — Other Ambulatory Visit: Payer: Self-pay

## 2020-10-26 DIAGNOSIS — D649 Anemia, unspecified: Secondary | ICD-10-CM | POA: Diagnosis present

## 2020-10-26 DIAGNOSIS — D62 Acute posthemorrhagic anemia: Secondary | ICD-10-CM | POA: Insufficient documentation

## 2020-10-26 LAB — HEMOGLOBIN AND HEMATOCRIT, BLOOD: Hemoglobin: 7.1 g/dL — ABNORMAL LOW (ref 12.0–15.0)

## 2020-10-26 LAB — PREPARE RBC (CROSSMATCH)

## 2020-10-26 LAB — ABO/RH: ABO/RH(D): A POS

## 2020-10-26 LAB — SURGICAL PATHOLOGY

## 2020-10-26 MED ORDER — DIPHENHYDRAMINE HCL 25 MG PO CAPS
25.0000 mg | ORAL_CAPSULE | Freq: Once | ORAL | Status: AC
  Administered 2020-10-26: 25 mg via ORAL

## 2020-10-26 MED ORDER — ACETAMINOPHEN 500 MG PO TABS
1000.0000 mg | ORAL_TABLET | Freq: Once | ORAL | Status: AC
  Administered 2020-10-26: 1000 mg via ORAL

## 2020-10-26 MED ORDER — ACETAMINOPHEN 500 MG PO TABS
ORAL_TABLET | ORAL | Status: AC
  Filled 2020-10-26: qty 2

## 2020-10-26 MED ORDER — SODIUM CHLORIDE 0.9% IV SOLUTION: Freq: Once | INTRAVENOUS | Status: AC

## 2020-10-26 MED ORDER — DIPHENHYDRAMINE HCL 25 MG PO CAPS
ORAL_CAPSULE | ORAL | Status: AC
  Filled 2020-10-26: qty 1

## 2020-10-26 MED ORDER — FUROSEMIDE 10 MG/ML IJ SOLN: 20.0000 mg | Freq: Once | INTRAMUSCULAR | Status: AC

## 2020-10-26 MED ORDER — FERROUS SULFATE 325 (65 FE) MG PO TABS: 325.0000 mg | ORAL_TABLET | Freq: Three times a day (TID) | ORAL | 1 refills | Status: DC

## 2020-10-26 MED ORDER — FUROSEMIDE 10 MG/ML IJ SOLN
INTRAMUSCULAR | Status: AC
  Administered 2020-10-26: 20 mg via INTRAVENOUS
  Filled 2020-10-26: qty 2

## 2020-10-26 MED ORDER — DOCUSATE SODIUM 100 MG PO CAPS: 100.0000 mg | ORAL_CAPSULE | Freq: Two times a day (BID) | ORAL | 2 refills | Status: DC | PRN

## 2020-10-26 NOTE — Progress Notes (Signed)
Per Dr. Marcelline Mates patient is okay to be discharged after two units of RBCs transfused with Hemoglobin recheck at 7.1.

## 2020-10-27 LAB — BPAM RBC
Blood Product Expiration Date: 202203262359
Blood Product Expiration Date: 202203262359
ISSUE DATE / TIME: 202203070948
ISSUE DATE / TIME: 202203071240
Unit Type and Rh: 6200
Unit Type and Rh: 6200

## 2020-10-27 LAB — TYPE AND SCREEN
ABO/RH(D): A POS
Antibody Screen: NEGATIVE
Unit division: 0
Unit division: 0

## 2020-11-02 ENCOUNTER — Ambulatory Visit
Admission: RE | Admit: 2020-11-02 | Discharge: 2020-11-02 | Disposition: A | Discharge status: Home / Self Care | Payer: 59 | Source: Ambulatory Visit | Attending: Obstetrics and Gynecology | Admitting: Obstetrics and Gynecology

## 2020-11-02 ENCOUNTER — Other Ambulatory Visit: Payer: Self-pay

## 2020-11-02 DIAGNOSIS — N920 Excessive and frequent menstruation with regular cycle: Secondary | ICD-10-CM | POA: Diagnosis not present

## 2020-11-02 DIAGNOSIS — Z86018 Personal history of other benign neoplasm: Secondary | ICD-10-CM

## 2020-11-23 NOTE — Addendum Note (Signed)
Encounter addended by: Kathyrn Drown, RN on: 11/23/2020 2:14 PM  Actions taken: Charge Capture section accepted

## 2021-01-12 ENCOUNTER — Encounter: Payer: Self-pay | Admitting: Hematology and Oncology

## 2021-01-13 ENCOUNTER — Other Ambulatory Visit: Payer: Self-pay

## 2021-01-13 ENCOUNTER — Ambulatory Visit (INDEPENDENT_AMBULATORY_CARE_PROVIDER_SITE_OTHER): Payer: 59 | Admitting: Obstetrics and Gynecology

## 2021-01-13 ENCOUNTER — Encounter: Payer: Self-pay | Admitting: Obstetrics and Gynecology

## 2021-01-13 VITALS — BP 123/78 | HR 74 | Ht 65.0 in | Wt 197.7 lb

## 2021-01-13 DIAGNOSIS — D500 Iron deficiency anemia secondary to blood loss (chronic): Secondary | ICD-10-CM | POA: Diagnosis not present

## 2021-01-13 DIAGNOSIS — Z86018 Personal history of other benign neoplasm: Secondary | ICD-10-CM

## 2021-01-13 DIAGNOSIS — N760 Acute vaginitis: Secondary | ICD-10-CM

## 2021-01-13 DIAGNOSIS — N979 Female infertility, unspecified: Secondary | ICD-10-CM

## 2021-01-13 DIAGNOSIS — N920 Excessive and frequent menstruation with regular cycle: Secondary | ICD-10-CM | POA: Diagnosis not present

## 2021-01-13 DIAGNOSIS — Z3169 Encounter for other general counseling and advice on procreation: Secondary | ICD-10-CM

## 2021-01-13 LAB — POCT URINALYSIS DIPSTICK
Bilirubin, UA: NEGATIVE
Glucose, UA: NEGATIVE
Ketones, UA: NEGATIVE
Leukocytes, UA: NEGATIVE
Nitrite, UA: NEGATIVE
Protein, UA: NEGATIVE
Spec Grav, UA: 1.02 (ref 1.010–1.025)
Urobilinogen, UA: 0.2 E.U./dL
pH, UA: 6 (ref 5.0–8.0)

## 2021-01-13 MED ORDER — FLUCONAZOLE 150 MG PO TABS: 150.0000 mg | ORAL_TABLET | Freq: Once | ORAL | 3 refills | Status: AC

## 2021-01-13 NOTE — Patient Instructions (Signed)
GENERAL PRE-OPERATIVE PATIENT INSTRUCTIONS    You are scheduled for surgery on 03/23/2015.   Nothing to eat after midnight on day prior to surgery.   Do not take any medications unless recommended by your provider on day prior to surgery.   Do not take NSAIDs (Motrin, Aleve) or aspirin 7 days prior to surgery.  You may take Tylenol products for minor aches and pains.   You will receive a prescription for pain medications post-operatively.   You will be contacted by phone approximately 1 week prior to surgery to schedule pre-operative appointment.   Please call the office if you have any questions regarding your upcoming surgery.

## 2021-01-13 NOTE — Progress Notes (Signed)
GYNECOLOGY PROGRESS NOTE  Subjective:    Patient ID: Sharon Herring, female    DOB: 13-Sep-1975, 45 y.o.   MRN: [id number]  HPI  Patient is a 45 y.o. G66P0010 female who presents for follow-up of endometrial biopsy results and further discussion of surgical management of her uterine fibroids and heavy bleeding.  She does have a history of anemia requiring iron transfusion and most recently a blood transfusion in March.  She is currently using Provera for management of her heavy bleeding however notes that her bleeding is still relatively heavy.  She also has questions regarding her future fertility.  Reports that she and her husband have been trying actively over the past 3 months, however have been married for ~ 2 years and do not use any form of contraception.  Additionally, Catricia reports complaints of intermittent vaginal itching with occasional white discharge since last month..  She is unsure if she may possibly have a yeast infection.  The following portions of the patient's history were reviewed and updated as appropriate: allergies, current medications, past family history, past medical history, past social history, past surgical history and problem list.  Review of Systems Pertinent items noted in HPI and remainder of comprehensive ROS otherwise negative.   Objective:   Blood pressure 123/78, pulse 74, height 5\' 5"  (1.651 m), weight 197 lb 11.2 oz (89.7 kg), last menstrual period 01/13/2021. General appearance: alert and no distress Abdomen: soft, non-tender; bowel sounds normal; no masses,  no organomegaly Pelvic: external genitalia normal, rectovaginal septum normal.  Vagina with small amount of blood in vaginal vault. Unable to visualize discharge.  Cervix normal appearing, no lesions and no motion tenderness.  Uterus mobile, nontender, normal shape and slightly enlarged ~ 12-14 week sized.  Adnexae non-palpable, nontender bilaterally.  Extremities: extremities normal, atraumatic,  no cyanosis or edema Neurologic: Grossly normal    Labs:  CBC Latest Ref Rng & Units 10/26/2020 10/22/2020 10/21/2020  WBC 3.4 - 10.8 x10E3/uL - 5.4 6.1  Hemoglobin 12.0 - 15.0 g/dL 7.1(L) 5.1(LL) 5.3(L)  Hematocrit 36.0 - 46.0 % RESULTS UNAVAILABLE DUE TO INTERFERING SUBSTANCE 19.2(L) RESULTS UNAVAILABLE DUE TO INTERFERING SUBSTANCE  Platelets 150 - 450 x10E3/uL - 568(H) 493(H)    Lab Results  Component Value Date   TSH 2.760 10/22/2020     Pathology:  A. ENDOMETRIUM, BIOPSY:  - Benign endometrium with breakdown  - Negative for hyperplasia or malignancy   Imaging:  US PELVIS TRANSVAGINAL NON-OB (TV ONLY) CLINICAL DATA:  Initial evaluation for dysfunctional uterine bleeding, history of fibroid uterus.  EXAM: TRANSABDOMINAL AND TRANSVAGINAL ULTRASOUND OF PELVIS  TECHNIQUE: Both transabdominal and transvaginal ultrasound examinations of the pelvis were performed. Transabdominal technique was performed for global imaging of the pelvis including uterus, ovaries, adnexal regions, and pelvic cul-de-sac. It was necessary to proceed with endovaginal exam following the transabdominal exam to visualize the uterus, endometrium, and ovaries.  COMPARISON:  Prior ultrasound from 11/07/2018  FINDINGS: Uterus  Measurements: 14.0 x 10.3 x 12.2 cm = volume: 917.9 mL. Uterus is anteverted with at least 7 separate fibroids visualized, largest of which are measured. 5.4 x 4.0 x 4.6 cm submucosal fibroid at the posterior uterine body. 4.5 x 4.3 x 4.3 cm submucosal fibroid at the anterior uterine body. 4.6 x 3.4 x 4.0 cm intramural fibroid at the right uterine body.  Endometrium  Thickness: 7 mm. Endometrial complex is somewhat deformed and obscured by adjacent uterine fibroids, with no definite focal abnormality.  Right ovary  Not  visualized.  No adnexal mass.  Left ovary  Measurements: 3.9 x 2.9 x 2.1 cm = volume: 12.4 mL. 2.2 x 2.0 x 1.8 cm dominant follicle noted. No other  adnexal mass.  Other findings  No abnormal free fluid.  IMPRESSION: 1. Enlarged fibroid uterus as detailed above. 2. Endometrial stripe measures up to 7 mm in thickness. If bleeding remains unresponsive to hormonal or medical therapy, sonohysterogram should be considered for focal lesion work-up. (Ref: Radiological Reasoning: Algorithmic Workup of Abnormal Vaginal Bleeding with Endovaginal Sonography and Sonohysterography. AJR 2008; 093:A35-57). 3. Normal left ovary, with nonvisualization of the right ovary. No adnexal mass.  Electronically Signed   By: Jeannine Boga M.D.   On: 11/02/2020 19:11 US PELVIS (TRANSABDOMINAL ONLY) CLINICAL DATA:  Initial evaluation for dysfunctional uterine bleeding, history of fibroid uterus.  EXAM: TRANSABDOMINAL AND TRANSVAGINAL ULTRASOUND OF PELVIS  TECHNIQUE: Both transabdominal and transvaginal ultrasound examinations of the pelvis were performed. Transabdominal technique was performed for global imaging of the pelvis including uterus, ovaries, adnexal regions, and pelvic cul-de-sac. It was necessary to proceed with endovaginal exam following the transabdominal exam to visualize the uterus, endometrium, and ovaries.  COMPARISON:  Prior ultrasound from 11/07/2018  FINDINGS: Uterus  Measurements: 14.0 x 10.3 x 12.2 cm = volume: 917.9 mL. Uterus is anteverted with at least 7 separate fibroids visualized, largest of which are measured. 5.4 x 4.0 x 4.6 cm submucosal fibroid at the posterior uterine body. 4.5 x 4.3 x 4.3 cm submucosal fibroid at the anterior uterine body. 4.6 x 3.4 x 4.0 cm intramural fibroid at the right uterine body.  Endometrium  Thickness: 7 mm. Endometrial complex is somewhat deformed and obscured by adjacent uterine fibroids, with no definite focal abnormality.  Right ovary  Not visualized.  No adnexal mass.  Left ovary  Measurements: 3.9 x 2.9 x 2.1 cm = volume: 12.4 mL. 2.2 x 2.0 x 1.8 cm dominant  follicle noted. No other adnexal mass.  Other findings  No abnormal free fluid.  IMPRESSION: 1. Enlarged fibroid uterus as detailed above. 2. Endometrial stripe measures up to 7 mm in thickness. If bleeding remains unresponsive to hormonal or medical therapy, sonohysterogram should be considered for focal lesion work-up. (Ref: Radiological Reasoning: Algorithmic Workup of Abnormal Vaginal Bleeding with Endovaginal Sonography and Sonohysterography. AJR 2008; 322:G25-42). 3. Normal left ovary, with nonvisualization of the right ovary. No adnexal mass.  Electronically Signed   By: Jeannine Boga M.D.   On: 11/02/2020 19:11   Assessment:   1. Menorrhagia with regular cycle   2. History of uterine fibroid   3. Iron deficiency anemia due to chronic blood loss   4. Acute vaginitis   5. Encounter for preconception consultation     Plan:   1. Menorrhagia with regular cycle -  Currently on Provera but notes that it is not helping much.  Offered to change to different medication Lysteda, however patient notes she would prefer to proceed with surgical intervention.  2. History of uterine fibroids - further discussion of myomectomy had with patient. Based on location and size, would recommend robotic myomectomy.  Discussed risks/benefits of procedure.  Will tentatively schedule for 02/08/2021.  3. Iron deficiency anemia, currently on iron supplementation.  Will recheck prior to surgical intervention.  4. Vaginitis - unable to perform wet-prep due to blood present in vaginal vault. Nuswab performed. Offered patient empiric treatment with Diflucan which she agrees to. Will prescribe.  5. Preconception consultation - patient wonders regarding her fertility status.  Discussed  collecting labs to assess ovarian reserve and hormonal status. Also advised on option of performing chromotubation at time of myomectomy.  Lastly, discussed that if her labs and evaluation are normal, could consider  semen analysis after myomectomy performed. Advised on recommendations for waiting period after myomectomy for conception.     A total of 15 minutes were spent face-to-face with the patient during this encounter and over half of that time dealt with counseling and coordination of care.   Rubie Maid, MD [facility]

## 2021-01-13 NOTE — Progress Notes (Signed)
Pt present for follow up after endometrial bx and to discuss surgery for fibroids. Pt stated that she has been having UTI symptoms UA completed and documented. Pt started cycle today. UA positive for blood.

## 2021-01-14 ENCOUNTER — Encounter: Payer: Self-pay | Admitting: Obstetrics and Gynecology

## 2021-01-17 LAB — TSH: TSH: 2.21 u[IU]/mL (ref 0.450–4.500)

## 2021-01-17 LAB — PROGESTERONE: Progesterone: 0.5 ng/mL

## 2021-01-17 LAB — ANTI MULLERIAN HORMONE: ANTI-MULLERIAN HORMONE (AMH): 0.396 ng/mL

## 2021-01-17 LAB — FSH/LH
FSH: 4.7 m[IU]/mL
LH: 4.7 m[IU]/mL

## 2021-01-17 LAB — ESTRADIOL: Estradiol: 20.3 pg/mL

## 2021-01-19 LAB — NUSWAB VAGINITIS PLUS (VG+)
BVAB 2: HIGH Score — AB
Candida albicans, NAA: POSITIVE — AB
Candida glabrata, NAA: NEGATIVE
Chlamydia trachomatis, NAA: NEGATIVE
Megasphaera 1: HIGH Score — AB
Neisseria gonorrhoeae, NAA: NEGATIVE
Trich vag by NAA: NEGATIVE

## 2021-01-19 MED ORDER — METRONIDAZOLE 500 MG PO TABS: 500.0000 mg | ORAL_TABLET | Freq: Two times a day (BID) | ORAL | 0 refills | Status: DC

## 2021-01-19 NOTE — Addendum Note (Signed)
Addended by: Augusto Gamble on: 01/19/2021 04:52 PM   Modules accepted: Orders

## 2021-02-02 ENCOUNTER — Encounter: Payer: Self-pay | Admitting: Family Medicine

## 2021-02-11 ENCOUNTER — Other Ambulatory Visit: Payer: Self-pay

## 2021-02-11 ENCOUNTER — Encounter
Admission: RE | Admit: 2021-02-11 | Discharge: 2021-02-11 | Disposition: A | Discharge status: Home / Self Care | Payer: No Typology Code available for payment source | Source: Ambulatory Visit | Attending: Obstetrics and Gynecology | Admitting: Obstetrics and Gynecology

## 2021-02-11 IMAGING — DX DG CHEST 2V
2 series · 2 of 2 positions shown · non-contrast
Comparison: 05/27/2019

CLINICAL DATA: Chest pain and shortness of breath for 5 days,
recently tested positive for DP24T-PC in April 2019

EXAM:
CHEST - 2 VIEW

[chest pa]
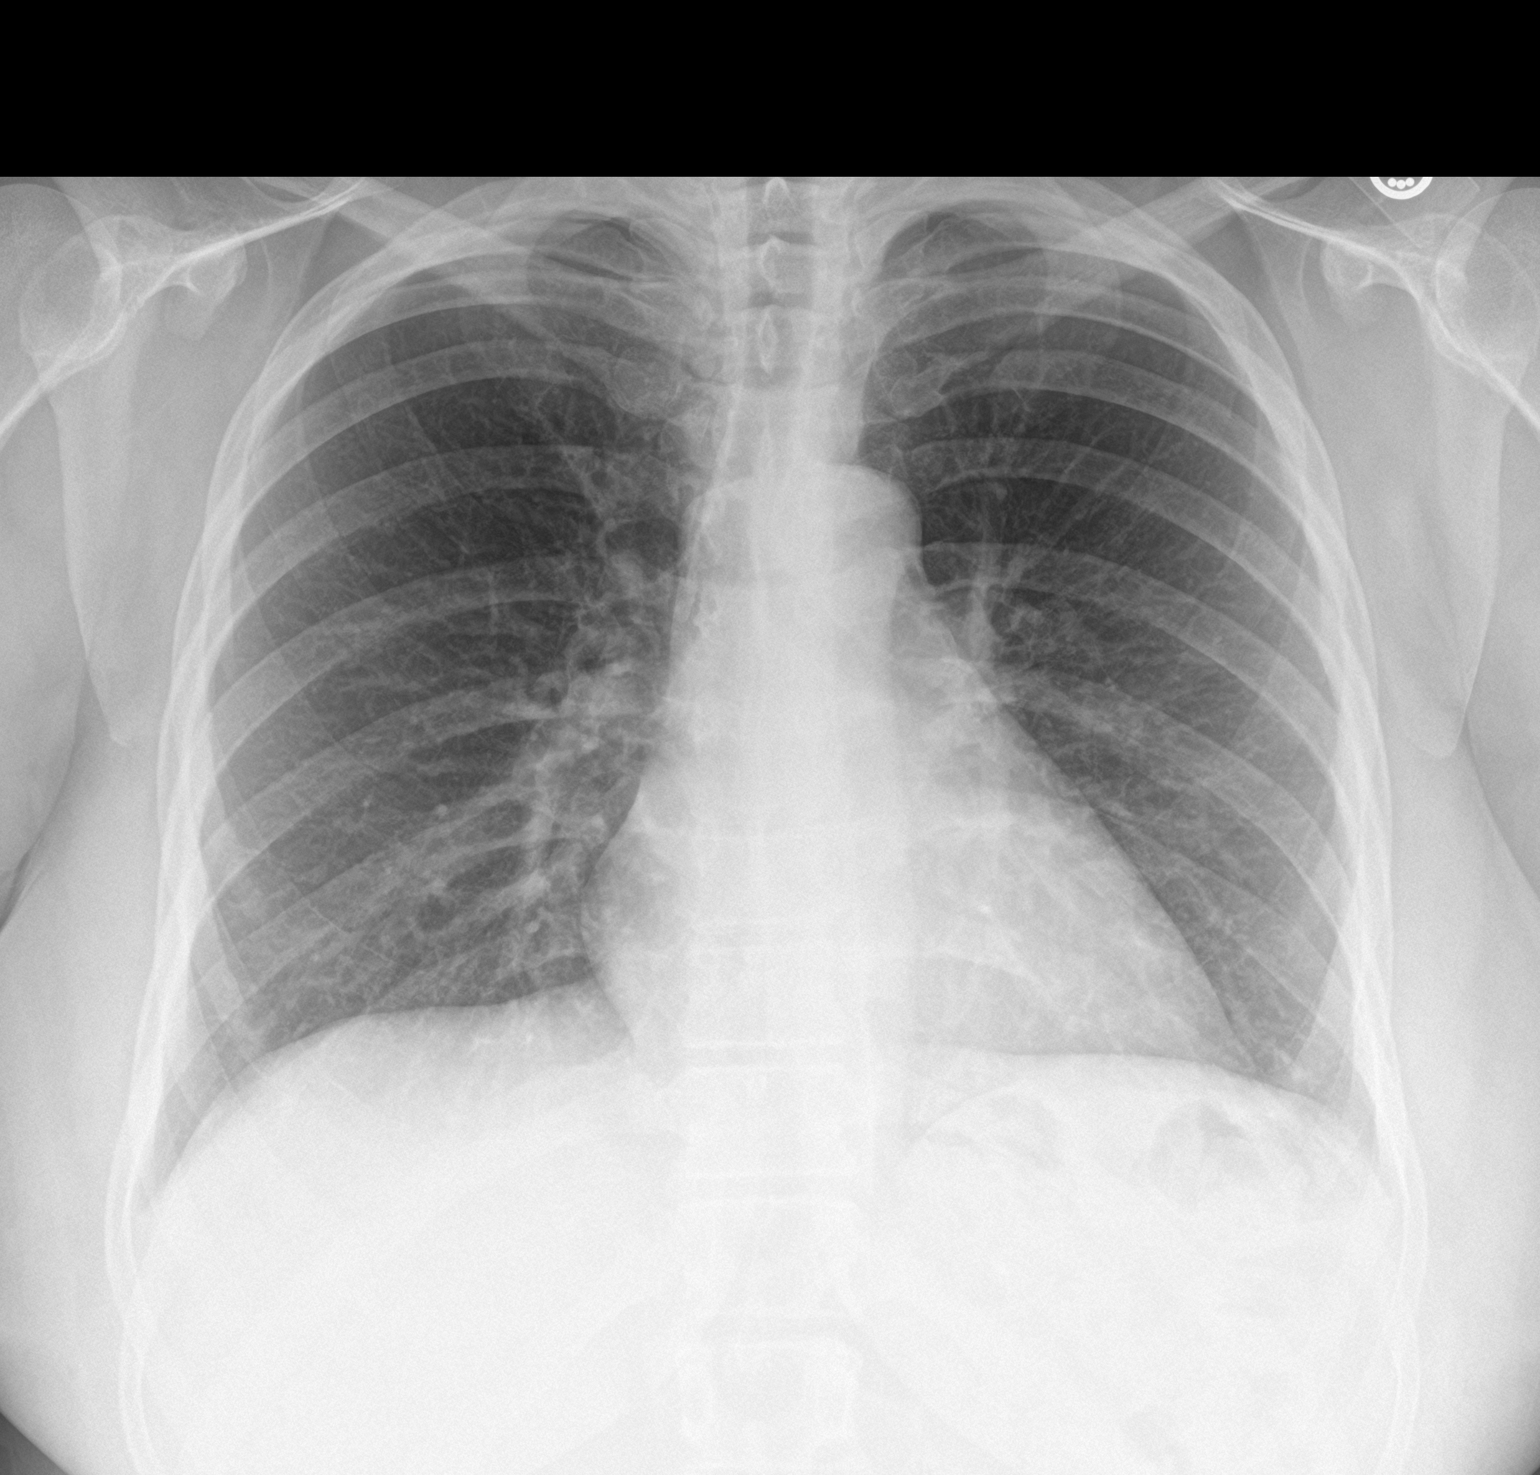

[chest lat]
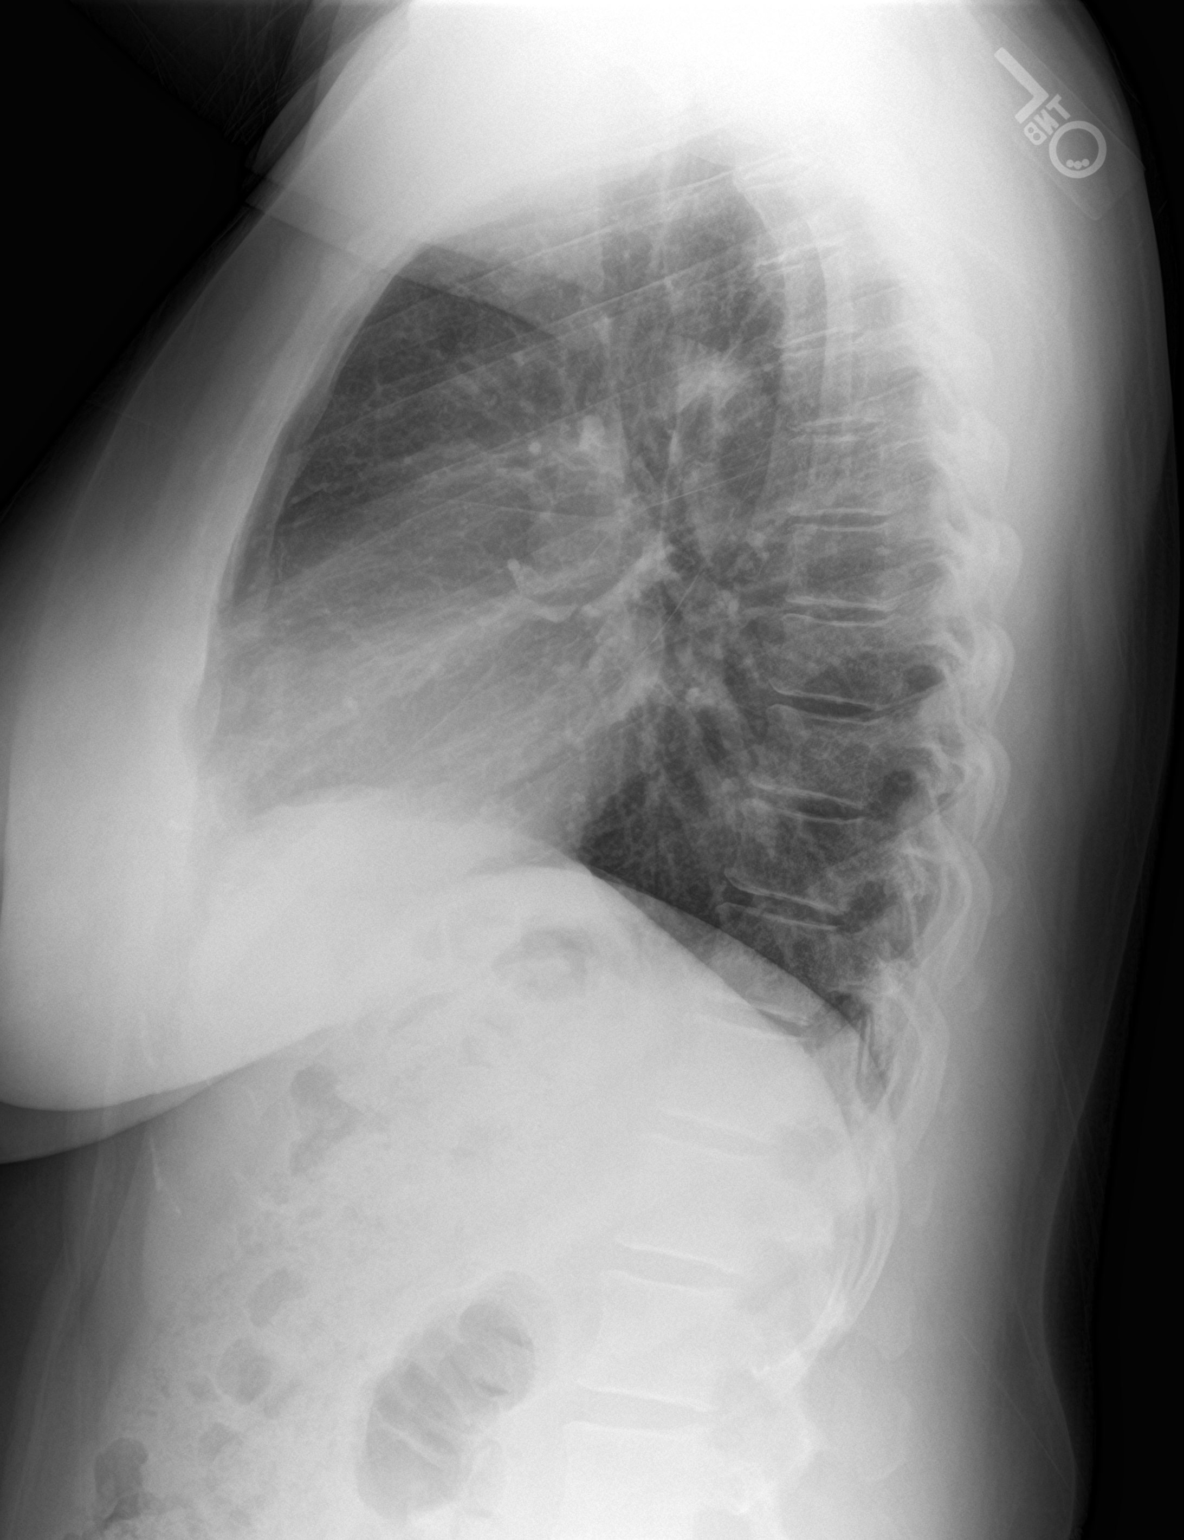

[2 of 2 positions shown; findings below may reference images not displayed]

FINDINGS: Upper normal heart size.

Mediastinal contours and pulmonary vascularity normal.

Lungs clear.

No infiltrate, pleural effusion or pneumothorax.

Osseous structures unremarkable.
IMPRESSION: No acute abnormalities.

## 2021-02-11 NOTE — Patient Instructions (Signed)
Your procedure is scheduled on: 02/18/21 Report to [facility]. To find out your arrival time please call [phone] between 1PM - 3PM on 02/17/21.  Remember: Instructions that are not followed completely may result in serious medical risk, up to and including death, or upon the discretion of your surgeon and anesthesiologist your surgery may need to be rescheduled.     _X__ 1. Do not eat food after midnight the night before your procedure.                 No gum chewing or hard candies. You may drink clear liquids up to 2 hours                 before you are scheduled to arrive for your surgery- DO not drink clear                 liquids within 2 hours of the start of your surgery.                 Clear Liquids include:  water, apple juice without pulp, clear carbohydrate                 drink such as Clearfast or Gatorade, Black Coffee or Tea (Do not add                 anything to coffee or tea). Diabetics water only  __X__2.  On the morning of surgery brush your teeth with toothpaste and water, you                 may rinse your mouth with mouthwash if you wish.  Do not swallow any              toothpaste of mouthwash.     _X__ 3.  No Alcohol for 24 hours before or after surgery.   _X__ 4.  Do Not Smoke or use e-cigarettes For 24 Hours Prior to Your Surgery.                 Do not use any chewable tobacco products for at least 6 hours prior to                 surgery.  ____  5.  Bring all medications with you on the day of surgery if instructed.   __X__  6.  Notify your doctor if there is any change in your medical condition      (cold, fever, infections).     Do not wear jewelry, make-up, hairpins, clips or nail polish. Do not wear lotions, powders, or perfumes.  Do not shave 48 hours prior to surgery. Men may shave face and neck. Do not bring valuables to the hospital.    [facility] is not responsible for any belongings or  valuables.  Contacts, dentures/partials or body piercings may not be worn into surgery. Bring a case for your contacts, glasses or hearing aids, a denture cup will be supplied. Leave your suitcase in the car. After surgery it may be brought to your room. For patients admitted to the hospital, discharge time is determined by your treatment team.   Patients discharged the day of surgery will not be allowed to drive home.   Please read over the following fact sheets that you were given:   CHG SOAP  __X__ Take these medicines the morning of surgery with A SIP OF WATER:  1. NONE  2.   3.   4.  5.  6.  ____ Fleet Enema (as directed)   __X__ Use CHG Soap/SAGE wipes as directed  ____ Use inhalers on the day of surgery  ____ Stop metformin/Janumet/Farxiga 2 days prior to surgery    ____ Take 1/2 of usual insulin dose the night before surgery. No insulin the morning          of surgery.   ____ Stop Blood Thinners Coumadin/Plavix/Xarelto/Pleta/Pradaxa/Eliquis/Effient/Aspirin  on   Or contact your Surgeon, Cardiologist or Medical Doctor regarding  ability to stop your blood thinners  __X__ Stop Anti-inflammatories 7 days before surgery such as Advil, Ibuprofen, Motrin,  BC or Goodies Powder, Naprosyn, Naproxen, Aleve, Aspirin    __X__ Stop all herbal supplements, fish oil or vitamin E until after surgery.    ____ Bring C-Pap to the hospital.

## 2021-02-16 ENCOUNTER — Other Ambulatory Visit
Admission: RE | Admit: 2021-02-16 | Discharge: 2021-02-16 | Disposition: A | Discharge status: Home / Self Care | Payer: 59 | Source: Ambulatory Visit | Attending: Obstetrics and Gynecology | Admitting: Obstetrics and Gynecology

## 2021-02-16 ENCOUNTER — Other Ambulatory Visit: Payer: Self-pay

## 2021-02-16 ENCOUNTER — Encounter: Payer: Self-pay | Admitting: Obstetrics and Gynecology

## 2021-02-16 ENCOUNTER — Other Ambulatory Visit: Payer: 59

## 2021-02-16 ENCOUNTER — Telehealth: Payer: Self-pay | Admitting: Obstetrics and Gynecology

## 2021-02-16 DIAGNOSIS — Z01812 Encounter for preprocedural laboratory examination: Secondary | ICD-10-CM | POA: Insufficient documentation

## 2021-02-16 DIAGNOSIS — Z20822 Contact with and (suspected) exposure to covid-19: Secondary | ICD-10-CM | POA: Insufficient documentation

## 2021-02-16 LAB — SARS CORONAVIRUS 2 (TAT 6-24 HRS): SARS Coronavirus 2: NEGATIVE

## 2021-02-16 LAB — COMPREHENSIVE METABOLIC PANEL
ALT: 9 U/L (ref 0–44)
AST: 16 U/L (ref 15–41)
Albumin: 3.6 g/dL (ref 3.5–5.0)
Alkaline Phosphatase: 62 U/L (ref 38–126)
Anion gap: 4 — ABNORMAL LOW (ref 5–15)
BUN: 10 mg/dL (ref 6–20)
CO2: 27 mmol/L (ref 22–32)
Calcium: 8.8 mg/dL — ABNORMAL LOW (ref 8.9–10.3)
Chloride: 108 mmol/L (ref 98–111)
Creatinine, Ser: 0.67 mg/dL (ref 0.44–1.00)
GFR, Estimated: >60 mL/min (ref >60)
Glucose, Bld: 98 mg/dL (ref 70–99)
Potassium: 3.7 mmol/L (ref 3.5–5.1)
Sodium: 139 mmol/L (ref 135–145)
Total Bilirubin: 0.3 mg/dL (ref 0.3–1.2)
Total Protein: 7.4 g/dL (ref 6.5–8.1)

## 2021-02-16 NOTE — Progress Notes (Addendum)
  [facility] Perioperative Services: Pre-Admission/Anesthesia Testing  Abnormal Lab Notification   Date: 02/16/21  Name: Sharon Herring MRN:   [id number]  Re: Abnormal labs noted during PAT appointment   Provider(s) Notified: Rubie Maid, MD Notification mode: Routed and/or faxed via [place] LAB VALUE(S): Lab Results  Component Value Date   HGB 5.9 (L) 02/16/2021   HCT 21.2 (L) 02/16/2021   MCV 60.4 (L) 02/16/2021   MCH 16.8 (L) 02/16/2021   PLT 1,096 ([place]) 02/16/2021    Notes:  Patient is scheduled for a XI ROBOTIC ASSISTED MYOMECTOMY CHROMOPERTUBATION on 02/18/2021. Lab verified count manually. PMH (+) for thrombocytosis, with platelet counts ranging from 432,000 - 675,000 between 2017 and present. Highest recorded platelet count noted on today's PAT labs. Patient with a history of chronic IDA; followed by hematology. She has required Fe infusions in the past; last 06/2019.  This is a Community education officer; no formal response is required.  Honor Loh, MSN, APRN, FNP-C, CEN [facility]  Peri-operative Services Nurse Practitioner Phone: [phone] Fax: [phone] 02/16/21 3:07 PM

## 2021-02-16 NOTE — Telephone Encounter (Signed)
Sharon Herring called in and states her employer needsa note written up stating that Sharon Herring is having surgery and will be out of work on Thursday (6/30) and Friday (7/1).

## 2021-02-17 ENCOUNTER — Inpatient Hospital Stay
Admission: AD | Admit: 2021-02-17 | Discharge: 2021-02-22 | DRG: 987 | Disposition: A | Discharge status: Home / Self Care | Payer: 59 | Attending: Family Medicine | Admitting: Family Medicine

## 2021-02-17 ENCOUNTER — Telehealth: Payer: Self-pay | Admitting: Obstetrics and Gynecology

## 2021-02-17 ENCOUNTER — Other Ambulatory Visit: Payer: Self-pay

## 2021-02-17 ENCOUNTER — Encounter: Payer: Self-pay | Admitting: Obstetrics and Gynecology

## 2021-02-17 ENCOUNTER — Other Ambulatory Visit: Payer: Self-pay | Admitting: Obstetrics and Gynecology

## 2021-02-17 DIAGNOSIS — Y838 Other surgical procedures as the cause of abnormal reaction of the patient, or of later complication, without mention of misadventure at the time of the procedure: Secondary | ICD-10-CM | POA: Diagnosis present

## 2021-02-17 DIAGNOSIS — D250 Submucous leiomyoma of uterus: Secondary | ICD-10-CM | POA: Diagnosis present

## 2021-02-17 DIAGNOSIS — K660 Peritoneal adhesions (postprocedural) (postinfection): Secondary | ICD-10-CM | POA: Diagnosis present

## 2021-02-17 DIAGNOSIS — E669 Obesity, unspecified: Secondary | ICD-10-CM | POA: Diagnosis present

## 2021-02-17 DIAGNOSIS — J9601 Acute respiratory failure with hypoxia: Secondary | ICD-10-CM | POA: Diagnosis present

## 2021-02-17 DIAGNOSIS — Z87820 Personal history of traumatic brain injury: Secondary | ICD-10-CM

## 2021-02-17 DIAGNOSIS — Z6832 Body mass index (BMI) 32.0-32.9, adult: Secondary | ICD-10-CM

## 2021-02-17 DIAGNOSIS — N920 Excessive and frequent menstruation with regular cycle: Secondary | ICD-10-CM | POA: Diagnosis present

## 2021-02-17 DIAGNOSIS — R5082 Postprocedural fever: Secondary | ICD-10-CM | POA: Diagnosis present

## 2021-02-17 DIAGNOSIS — D251 Intramural leiomyoma of uterus: Secondary | ICD-10-CM | POA: Diagnosis present

## 2021-02-17 DIAGNOSIS — Z8249 Family history of ischemic heart disease and other diseases of the circulatory system: Secondary | ICD-10-CM

## 2021-02-17 DIAGNOSIS — I2699 Other pulmonary embolism without acute cor pulmonale: Secondary | ICD-10-CM | POA: Diagnosis present

## 2021-02-17 DIAGNOSIS — Z20822 Contact with and (suspected) exposure to covid-19: Secondary | ICD-10-CM | POA: Diagnosis present

## 2021-02-17 DIAGNOSIS — T81718A Complication of other artery following a procedure, not elsewhere classified, initial encounter: Principal | ICD-10-CM | POA: Diagnosis present

## 2021-02-17 DIAGNOSIS — K5909 Other constipation: Secondary | ICD-10-CM

## 2021-02-17 DIAGNOSIS — Z9889 Other specified postprocedural states: Secondary | ICD-10-CM

## 2021-02-17 DIAGNOSIS — Z800 Family history of malignant neoplasm of digestive organs: Secondary | ICD-10-CM

## 2021-02-17 DIAGNOSIS — D509 Iron deficiency anemia, unspecified: Secondary | ICD-10-CM | POA: Diagnosis present

## 2021-02-17 DIAGNOSIS — D62 Acute posthemorrhagic anemia: Secondary | ICD-10-CM | POA: Diagnosis present

## 2021-02-17 HISTORY — DX: Personal history of diseases of the blood and blood-forming organs and certain disorders involving the immune mechanism: Z86.2

## 2021-02-17 LAB — PREPARE RBC (CROSSMATCH)

## 2021-02-17 LAB — CBC
HCT: 21.2 % — ABNORMAL LOW (ref 36.0–46.0)
Hemoglobin: 5.9 g/dL — ABNORMAL LOW (ref 12.0–15.0)
MCH: 16.8 pg — ABNORMAL LOW (ref 26.0–34.0)
MCHC: 27.8 g/dL — ABNORMAL LOW (ref 30.0–36.0)
MCV: 60.4 fL — ABNORMAL LOW (ref 80.0–100.0)
Platelets: 1096 10*3/uL (ref 150–400)
RBC: 3.51 MIL/uL — ABNORMAL LOW (ref 3.87–5.11)
RDW: 25.6 % — ABNORMAL HIGH (ref 11.5–15.5)
WBC: 5.1 10*3/uL (ref 4.0–10.5)
nRBC: 0.4 % — ABNORMAL HIGH (ref 0.0–0.2)

## 2021-02-17 LAB — PATHOLOGIST SMEAR REVIEW

## 2021-02-17 MED ORDER — DOCUSATE SODIUM 100 MG PO CAPS
100.0000 mg | ORAL_CAPSULE | Freq: Two times a day (BID) | ORAL | Status: DC
  Administered 2021-02-18 – 2021-02-22 (×9): 100 mg via ORAL
  Filled 2021-02-17 (×9): qty 1

## 2021-02-17 MED ORDER — LACTATED RINGERS IV SOLN
INTRAVENOUS | Status: DC
  Administered 2021-02-18: 980 mL via INTRAVENOUS

## 2021-02-17 MED ORDER — ZOLPIDEM TARTRATE 5 MG PO TABS: 5.0000 mg | ORAL_TABLET | Freq: Every evening | ORAL | Status: DC | PRN

## 2021-02-17 MED ORDER — MAGNESIUM CITRATE PO SOLN
1.0000 | Freq: Once | ORAL | Status: DC | PRN
  Filled 2021-02-17: qty 296

## 2021-02-17 MED ORDER — HYDROMORPHONE HCL 1 MG/ML IJ SOLN: 0.2000 mg | INTRAMUSCULAR | Status: DC | PRN

## 2021-02-17 MED ORDER — MAGNESIUM HYDROXIDE 400 MG/5ML PO SUSP
30.0000 mL | Freq: Every day | ORAL | Status: DC | PRN
  Administered 2021-02-20: 30 mL via ORAL
  Filled 2021-02-17: qty 30

## 2021-02-17 MED ORDER — ALUM & MAG HYDROXIDE-SIMETH 200-200-20 MG/5ML PO SUSP: 30.0000 mL | ORAL | Status: DC | PRN

## 2021-02-17 MED ORDER — TRAMADOL HCL 50 MG PO TABS
50.0000 mg | ORAL_TABLET | Freq: Four times a day (QID) | ORAL | Status: DC | PRN
  Administered 2021-02-17 – 2021-02-18 (×2): 50 mg via ORAL
  Filled 2021-02-17 (×2): qty 1

## 2021-02-17 MED ORDER — IBUPROFEN 600 MG PO TABS
600.0000 mg | ORAL_TABLET | Freq: Four times a day (QID) | ORAL | Status: DC | PRN
  Filled 2021-02-17: qty 1

## 2021-02-17 MED ORDER — ACETAMINOPHEN 500 MG PO TABS
1000.0000 mg | ORAL_TABLET | Freq: Once | ORAL | Status: AC
  Administered 2021-02-17: 1000 mg via ORAL
  Filled 2021-02-17: qty 2

## 2021-02-17 MED ORDER — SODIUM CHLORIDE 0.9% IV SOLUTION: Freq: Once | INTRAVENOUS | Status: AC

## 2021-02-17 MED ORDER — BISACODYL 5 MG PO TBEC
5.0000 mg | DELAYED_RELEASE_TABLET | Freq: Every day | ORAL | Status: DC | PRN
  Filled 2021-02-17: qty 1

## 2021-02-17 MED ORDER — ONDANSETRON HCL 4 MG/2ML IJ SOLN: 4.0000 mg | Freq: Four times a day (QID) | INTRAMUSCULAR | Status: DC | PRN

## 2021-02-17 MED ORDER — ONDANSETRON HCL 4 MG PO TABS: 4.0000 mg | ORAL_TABLET | Freq: Four times a day (QID) | ORAL | Status: DC | PRN

## 2021-02-17 MED ORDER — FUROSEMIDE 10 MG/ML IJ SOLN
20.0000 mg | Freq: Once | INTRAMUSCULAR | Status: AC
  Administered 2021-02-18: 20 mg via INTRAVENOUS
  Filled 2021-02-17: qty 2

## 2021-02-17 MED ORDER — DIPHENHYDRAMINE HCL 25 MG PO CAPS
25.0000 mg | ORAL_CAPSULE | Freq: Once | ORAL | Status: AC
  Administered 2021-02-17: 25 mg via ORAL
  Filled 2021-02-17: qty 1

## 2021-02-17 MED ORDER — DIPHENHYDRAMINE HCL 25 MG PO CAPS
25.0000 mg | ORAL_CAPSULE | Freq: Once | ORAL | Status: AC
Start: 2021-02-17 — End: 2021-02-17

## 2021-02-17 NOTE — Telephone Encounter (Signed)
Dr. Marcelline Mates already completed the work note.

## 2021-02-17 NOTE — Telephone Encounter (Signed)
Contacted patient about pre-operative labs. Significant anemia noted. Patient notes she just completed her cycle. Is otherwise asymptomatic.  Discussed need for blood transfusion prior to surgery if she still planned to proceed as scheduled.  Otherwise would need to postpone. Patient still desires to have surgery tomorrow. Will be direct admitted today.    Rubie Maid, MD [facility] 02/17/2021 8:36 AM

## 2021-02-17 NOTE — Progress Notes (Signed)
Reported to provider that patient has been having a back ache for a couple hours. Medication given. No new orders given.

## 2021-02-17 NOTE — Progress Notes (Signed)
error 

## 2021-02-18 ENCOUNTER — Observation Stay: Payer: 59 | Admitting: Urgent Care

## 2021-02-18 ENCOUNTER — Encounter
Admission: AD | Disposition: A | Discharge status: Home / Self Care | Payer: Self-pay | Source: Home / Self Care | Attending: Obstetrics and Gynecology

## 2021-02-18 ENCOUNTER — Encounter: Payer: Self-pay | Admitting: Obstetrics and Gynecology

## 2021-02-18 DIAGNOSIS — Z86018 Personal history of other benign neoplasm: Secondary | ICD-10-CM | POA: Diagnosis not present

## 2021-02-18 DIAGNOSIS — D259 Leiomyoma of uterus, unspecified: Secondary | ICD-10-CM

## 2021-02-18 DIAGNOSIS — N920 Excessive and frequent menstruation with regular cycle: Secondary | ICD-10-CM | POA: Diagnosis not present

## 2021-02-18 DIAGNOSIS — D62 Acute posthemorrhagic anemia: Secondary | ICD-10-CM

## 2021-02-18 HISTORY — PX: CHROMOPERTUBATION: SHX6288

## 2021-02-18 HISTORY — PX: ROBOT ASSISTED MYOMECTOMY: SHX5142

## 2021-02-18 LAB — HEMOGLOBIN AND HEMATOCRIT, BLOOD
HCT: 25 % — ABNORMAL LOW (ref 36.0–46.0)
Hemoglobin: 9.2 g/dL — ABNORMAL LOW (ref 12.0–15.0)

## 2021-02-18 LAB — PREGNANCY, URINE: Preg Test, Ur: NEGATIVE

## 2021-02-18 LAB — PREPARE RBC (CROSSMATCH)

## 2021-02-18 SURGERY — MYOMECTOMY, ROBOT-ASSISTED
Anesthesia: General

## 2021-02-18 MED ORDER — ACETAMINOPHEN 500 MG PO TABS
1000.0000 mg | ORAL_TABLET | Freq: Four times a day (QID) | ORAL | Status: DC
  Administered 2021-02-18 – 2021-02-21 (×10): 1000 mg via ORAL
  Filled 2021-02-18 (×11): qty 2

## 2021-02-18 MED ORDER — METHYLENE BLUE 0.5 % INJ SOLN
INTRAVENOUS | Status: AC
  Filled 2021-02-18: qty 10

## 2021-02-18 MED ORDER — SUGAMMADEX SODIUM 200 MG/2ML IV SOLN
INTRAVENOUS | Status: DC | PRN
  Administered 2021-02-18: 200 mg via INTRAVENOUS

## 2021-02-18 MED ORDER — ACETAMINOPHEN 10 MG/ML IV SOLN
INTRAVENOUS | Status: AC
  Filled 2021-02-18: qty 100

## 2021-02-18 MED ORDER — FENTANYL CITRATE (PF) 100 MCG/2ML IJ SOLN
INTRAMUSCULAR | Status: AC
  Filled 2021-02-18: qty 2

## 2021-02-18 MED ORDER — MENTHOL 3 MG MT LOZG
1.0000 | LOZENGE | OROMUCOSAL | Status: DC | PRN
  Filled 2021-02-18: qty 9

## 2021-02-18 MED ORDER — CEFAZOLIN SODIUM-DEXTROSE 2-4 GM/100ML-% IV SOLN
2.0000 g | INTRAVENOUS | Status: AC
  Administered 2021-02-18: 2 g via INTRAVENOUS
  Filled 2021-02-18: qty 100

## 2021-02-18 MED ORDER — LACTATED RINGERS IV SOLN: INTRAVENOUS | Status: DC

## 2021-02-18 MED ORDER — BUPIVACAINE HCL (PF) 0.5 % IJ SOLN
INTRAMUSCULAR | Status: AC
  Filled 2021-02-18: qty 30

## 2021-02-18 MED ORDER — PROPOFOL 10 MG/ML IV BOLUS
INTRAVENOUS | Status: DC | PRN
  Administered 2021-02-18: 160 mg via INTRAVENOUS

## 2021-02-18 MED ORDER — SODIUM CHLORIDE 0.9 % IR SOLN
Status: DC | PRN
  Administered 2021-02-18: 1000 mL

## 2021-02-18 MED ORDER — ONDANSETRON HCL 4 MG/2ML IJ SOLN
INTRAMUSCULAR | Status: DC | PRN
  Administered 2021-02-18: 4 mg via INTRAVENOUS

## 2021-02-18 MED ORDER — ONDANSETRON HCL 4 MG/2ML IJ SOLN: 4.0000 mg | Freq: Once | INTRAMUSCULAR | Status: DC | PRN

## 2021-02-18 MED ORDER — VASOPRESSIN 20 UNIT/ML IV SOLN
INTRAVENOUS | Status: AC
  Filled 2021-02-18: qty 1

## 2021-02-18 MED ORDER — SEVOFLURANE IN SOLN
RESPIRATORY_TRACT | Status: AC
  Filled 2021-02-18: qty 250

## 2021-02-18 MED ORDER — CEFAZOLIN SODIUM-DEXTROSE 2-4 GM/100ML-% IV SOLN
INTRAVENOUS | Status: AC
  Filled 2021-02-18: qty 100

## 2021-02-18 MED ORDER — FENTANYL CITRATE (PF) 100 MCG/2ML IJ SOLN
INTRAMUSCULAR | Status: AC
  Administered 2021-02-18: 25 ug via INTRAVENOUS
  Filled 2021-02-18: qty 2

## 2021-02-18 MED ORDER — MICROFIBRILLAR COLL HEMOSTAT EX PADS
MEDICATED_PAD | CUTANEOUS | Status: DC | PRN
  Administered 2021-02-18 (×2): 1 via TOPICAL

## 2021-02-18 MED ORDER — HYDROMORPHONE HCL 2 MG PO TABS
2.0000 mg | ORAL_TABLET | ORAL | Status: DC | PRN
  Administered 2021-02-18 – 2021-02-22 (×10): 2 mg via ORAL
  Filled 2021-02-18 (×10): qty 1

## 2021-02-18 MED ORDER — DEXAMETHASONE SODIUM PHOSPHATE 10 MG/ML IJ SOLN
INTRAMUSCULAR | Status: DC | PRN
  Administered 2021-02-18: 10 mg via INTRAVENOUS

## 2021-02-18 MED ORDER — LIDOCAINE HCL (PF) 2 % IJ SOLN
INTRAMUSCULAR | Status: AC
  Filled 2021-02-18: qty 5

## 2021-02-18 MED ORDER — ONDANSETRON HCL 4 MG/2ML IJ SOLN
INTRAMUSCULAR | Status: AC
  Filled 2021-02-18: qty 2

## 2021-02-18 MED ORDER — ACETAMINOPHEN 10 MG/ML IV SOLN
INTRAVENOUS | Status: DC | PRN
  Administered 2021-02-18: 1000 mg via INTRAVENOUS

## 2021-02-18 MED ORDER — PANTOPRAZOLE SODIUM 40 MG PO TBEC
40.0000 mg | DELAYED_RELEASE_TABLET | Freq: Every day | ORAL | Status: DC
  Administered 2021-02-19 – 2021-02-22 (×4): 40 mg via ORAL
  Filled 2021-02-18 (×4): qty 1

## 2021-02-18 MED ORDER — ORAL CARE MOUTH RINSE: 15.0000 mL | Freq: Once | OROMUCOSAL | Status: AC

## 2021-02-18 MED ORDER — CHLORHEXIDINE GLUCONATE 0.12 % MT SOLN
15.0000 mL | Freq: Once | OROMUCOSAL | Status: AC
  Filled 2021-02-18: qty 15

## 2021-02-18 MED ORDER — PHENYLEPHRINE HCL (PRESSORS) 10 MG/ML IV SOLN
INTRAVENOUS | Status: DC | PRN
  Administered 2021-02-18 (×4): 100 ug via INTRAVENOUS

## 2021-02-18 MED ORDER — MIDAZOLAM HCL 2 MG/2ML IJ SOLN
INTRAMUSCULAR | Status: DC | PRN
  Administered 2021-02-18: 2 mg via INTRAVENOUS

## 2021-02-18 MED ORDER — ROCURONIUM BROMIDE 10 MG/ML (PF) SYRINGE
PREFILLED_SYRINGE | INTRAVENOUS | Status: AC
  Filled 2021-02-18: qty 10

## 2021-02-18 MED ORDER — GABAPENTIN 300 MG PO CAPS
ORAL_CAPSULE | ORAL | Status: AC
  Administered 2021-02-18: 300 mg via ORAL
  Filled 2021-02-18: qty 1

## 2021-02-18 MED ORDER — LIDOCAINE HCL (PF) 1 % IJ SOLN
INTRAMUSCULAR | Status: AC
  Filled 2021-02-18: qty 30

## 2021-02-18 MED ORDER — GABAPENTIN 300 MG PO CAPS: 300.0000 mg | ORAL_CAPSULE | ORAL | Status: AC

## 2021-02-18 MED ORDER — ACETAMINOPHEN 500 MG PO TABS: 1000.0000 mg | ORAL_TABLET | ORAL | Status: AC

## 2021-02-18 MED ORDER — SIMETHICONE 80 MG PO CHEW
80.0000 mg | CHEWABLE_TABLET | Freq: Four times a day (QID) | ORAL | Status: DC | PRN
Start: 2021-02-18 — End: 2021-02-22
  Administered 2021-02-18 – 2021-02-20 (×4): 80 mg via ORAL
  Filled 2021-02-18 (×6): qty 1

## 2021-02-18 MED ORDER — DEXMEDETOMIDINE (PRECEDEX) IN NS 20 MCG/5ML (4 MCG/ML) IV SYRINGE
PREFILLED_SYRINGE | INTRAVENOUS | Status: DC | PRN
  Administered 2021-02-18 (×2): 8 ug via INTRAVENOUS

## 2021-02-18 MED ORDER — ACETAMINOPHEN 500 MG PO TABS
ORAL_TABLET | ORAL | Status: AC
  Administered 2021-02-18: 1000 mg via ORAL
  Filled 2021-02-18: qty 2

## 2021-02-18 MED ORDER — CHLORHEXIDINE GLUCONATE 0.12 % MT SOLN
OROMUCOSAL | Status: AC
  Administered 2021-02-18: 15 mL via OROMUCOSAL
  Filled 2021-02-18: qty 15

## 2021-02-18 MED ORDER — MORPHINE SULFATE (PF) 2 MG/ML IV SOLN
2.0000 mg | INTRAVENOUS | Status: DC | PRN
  Administered 2021-02-19: 2 mg via INTRAVENOUS
  Filled 2021-02-18 (×2): qty 1

## 2021-02-18 MED ORDER — SODIUM CHLORIDE (PF) 0.9 % IJ SOLN
INTRAMUSCULAR | Status: AC
  Filled 2021-02-18: qty 150

## 2021-02-18 MED ORDER — METHYLENE BLUE 0.5 % INJ SOLN
INTRAVENOUS | Status: DC | PRN
  Administered 2021-02-18: 10 mL via SUBMUCOSAL

## 2021-02-18 MED ORDER — BUPIVACAINE HCL 0.5 % IJ SOLN
INTRAMUSCULAR | Status: DC | PRN
  Administered 2021-02-18: 30 mL

## 2021-02-18 MED ORDER — VASOPRESSIN 20 UNIT/ML IV SOLN
INTRAVENOUS | Status: DC | PRN
  Administered 2021-02-18: 25 mL via INTRAMUSCULAR
  Administered 2021-02-18: 3 mL via INTRAMUSCULAR
  Administered 2021-02-18: 8 mL via INTRAMUSCULAR
  Administered 2021-02-18: 20 mL via INTRAMUSCULAR

## 2021-02-18 MED ORDER — 0.9 % SODIUM CHLORIDE (POUR BTL) OPTIME
TOPICAL | Status: DC | PRN
  Administered 2021-02-18: 500 mL

## 2021-02-18 MED ORDER — ROCURONIUM BROMIDE 100 MG/10ML IV SOLN
INTRAVENOUS | Status: DC | PRN
  Administered 2021-02-18: 20 mg via INTRAVENOUS
  Administered 2021-02-18: 5 mg via INTRAVENOUS
  Administered 2021-02-18 (×2): 20 mg via INTRAVENOUS
  Administered 2021-02-18: 60 mg via INTRAVENOUS

## 2021-02-18 MED ORDER — FENTANYL CITRATE (PF) 100 MCG/2ML IJ SOLN
25.0000 ug | INTRAMUSCULAR | Status: AC | PRN
  Administered 2021-02-18 (×6): 25 ug via INTRAVENOUS

## 2021-02-18 MED ORDER — POVIDONE-IODINE 10 % EX SWAB: 2.0000 "application " | Freq: Once | CUTANEOUS | Status: DC

## 2021-02-18 MED ORDER — FENTANYL CITRATE (PF) 100 MCG/2ML IJ SOLN
INTRAMUSCULAR | Status: DC | PRN
  Administered 2021-02-18 (×3): 50 ug via INTRAVENOUS

## 2021-02-18 MED ORDER — PROPOFOL 10 MG/ML IV BOLUS
INTRAVENOUS | Status: AC
  Filled 2021-02-18: qty 20

## 2021-02-18 MED ORDER — DEXAMETHASONE SODIUM PHOSPHATE 10 MG/ML IJ SOLN
INTRAMUSCULAR | Status: AC
  Filled 2021-02-18: qty 1

## 2021-02-18 MED ORDER — MIDAZOLAM HCL 2 MG/2ML IJ SOLN
INTRAMUSCULAR | Status: AC
  Filled 2021-02-18: qty 2

## 2021-02-18 MED ORDER — SODIUM CHLORIDE (PF) 0.9 % IJ SOLN
INTRAMUSCULAR | Status: AC
  Filled 2021-02-18: qty 100

## 2021-02-18 MED ORDER — LIDOCAINE HCL (CARDIAC) PF 100 MG/5ML IV SOSY
PREFILLED_SYRINGE | INTRAVENOUS | Status: DC | PRN
  Administered 2021-02-18: 100 mg via INTRAVENOUS

## 2021-02-18 SURGICAL SUPPLY — 86 items
BACTOSHIELD CHG 4% 4OZ (MISCELLANEOUS) ×1
BAG LAPAROSCOPIC 12 15 PORT 16 (BASKET) ×1 IMPLANT
BAG RETRIEVAL 12/15 (BASKET) ×2
BAG URINE DRAIN 2000ML AR STRL (UROLOGICAL SUPPLIES) ×2 IMPLANT
BINDER ABDOMINAL  9 SM 30-45 (SOFTGOODS) ×1
BINDER ABDOMINAL 9 SM 30-45 (SOFTGOODS) ×1 IMPLANT
BLADE SURG SZ11 CARB STEEL (BLADE) ×4 IMPLANT
BULB RESERV EVAC DRAIN JP 100C (MISCELLANEOUS) ×2 IMPLANT
CATH FOLEY 2WAY  5CC 16FR (CATHETERS) ×1
CATH URTH 16FR FL 2W BLN LF (CATHETERS) ×1 IMPLANT
CHLORAPREP W/TINT 26 (MISCELLANEOUS) ×2 IMPLANT
COVER TIP SHEARS 8 DVNC (MISCELLANEOUS) ×1 IMPLANT
COVER TIP SHEARS 8MM DA VINCI (MISCELLANEOUS) ×1
DEFOGGER SCOPE WARMER CLEARIFY (MISCELLANEOUS) ×2 IMPLANT
DERMABOND ADVANCED (GAUZE/BANDAGES/DRESSINGS) ×1
DERMABOND ADVANCED .7 DNX12 (GAUZE/BANDAGES/DRESSINGS) ×1 IMPLANT
DRAIN CHANNEL JP 15F RND 16 (MISCELLANEOUS) ×2 IMPLANT
DRAPE 3/4 80X56 (DRAPES) ×2 IMPLANT
DRAPE ARM DVNC X/XI (DISPOSABLE) ×4 IMPLANT
DRAPE COLUMN DVNC XI (DISPOSABLE) ×1 IMPLANT
DRAPE DA VINCI XI ARM (DISPOSABLE) ×4
DRAPE DA VINCI XI COLUMN (DISPOSABLE) ×1
DRAPE ROBOT W/ LEGGING 30X125 (DRAPES) ×2 IMPLANT
DRESSING SURGICEL FIBRLLR 1X2 (HEMOSTASIS) ×1 IMPLANT
DRSG SURGICEL FIBRILLAR 1X2 (HEMOSTASIS) ×2
DRSG TEGADERM 4X4.75 (GAUZE/BANDAGES/DRESSINGS) ×2 IMPLANT
ELECT CAUTERY BLADE 6.4 (BLADE) ×2 IMPLANT
ELECT REM PT RETURN 9FT ADLT (ELECTROSURGICAL) ×2
ELECTRODE REM PT RTRN 9FT ADLT (ELECTROSURGICAL) ×1 IMPLANT
FILTER LAP SMOKE EVAC STRL (MISCELLANEOUS) ×2 IMPLANT
GAUZE 4X4 16PLY ~~LOC~~+RFID DBL (SPONGE) ×4 IMPLANT
GLOVE SURG ENC MOIS LTX SZ6.5 (GLOVE) ×8 IMPLANT
GLOVE SURG POLY ORTHO LF SZ7.5 (GLOVE) ×2 IMPLANT
GLOVE SURG UNDER LTX SZ7 (GLOVE) ×8 IMPLANT
GOWN STRL REUS W/ TWL LRG LVL3 (GOWN DISPOSABLE) ×4 IMPLANT
GOWN STRL REUS W/TWL LRG LVL3 (GOWN DISPOSABLE) ×4
GRASPER SUT TROCAR 14GX15 (MISCELLANEOUS) ×2 IMPLANT
HANDLE YANKAUER SUCT BULB TIP (MISCELLANEOUS) ×2 IMPLANT
HEMOSTAT SURGICEL 2X14 (HEMOSTASIS) ×2 IMPLANT
HEMOSTAT SURGICEL 2X3 (HEMOSTASIS) ×2 IMPLANT
IRRIGATION STRYKERFLOW (MISCELLANEOUS) ×1 IMPLANT
IRRIGATOR STRYKERFLOW (MISCELLANEOUS) ×2
IV NS 1000ML (IV SOLUTION) ×1
IV NS 1000ML BAXH (IV SOLUTION) ×1 IMPLANT
KIT PINK PAD W/HEAD ARE REST (MISCELLANEOUS) ×2
KIT PINK PAD W/HEAD ARM REST (MISCELLANEOUS) ×1 IMPLANT
LABEL OR SOLS (LABEL) ×2 IMPLANT
MANIFOLD NEPTUNE II (INSTRUMENTS) ×2 IMPLANT
MANIPULATOR UTERINE 4.5 ZUMI (MISCELLANEOUS) ×2 IMPLANT
NDL HPO THNWL 1X22GA REG BVL (NEEDLE) ×1 IMPLANT
NEEDLE SAFETY 22GX1 (NEEDLE) ×1
NS IRRIG 1000ML POUR BTL (IV SOLUTION) ×2 IMPLANT
OCCLUDER COLPOPNEUMO (BALLOONS) ×2 IMPLANT
PACK GYN LAPAROSCOPIC (MISCELLANEOUS) ×2 IMPLANT
PACK LAP CHOLECYSTECTOMY (MISCELLANEOUS) ×2 IMPLANT
PAD OB MATERNITY 4.3X12.25 (PERSONAL CARE ITEMS) ×2 IMPLANT
PAD PREP 24X41 OB/GYN DISP (PERSONAL CARE ITEMS) ×2 IMPLANT
PENCIL ELECTRO HAND CTR (MISCELLANEOUS) ×2 IMPLANT
PORT ACCESS TROCAR AIRSEAL 12 (TROCAR) ×1 IMPLANT
PORT ACCESS TROCAR AIRSEAL 5M (TROCAR) ×1
RETRACTOR RING XSMALL (MISCELLANEOUS) ×1 IMPLANT
RTRCTR WOUND ALEXIS 13CM XS SH (MISCELLANEOUS) ×2
SCISSORS METZENBAUM CVD 33 (INSTRUMENTS) ×2 IMPLANT
SCRUB CHG 4% DYNA-HEX 4OZ (MISCELLANEOUS) ×1 IMPLANT
SEAL CANN UNIV 5-8 DVNC XI (MISCELLANEOUS) ×3 IMPLANT
SEAL XI 5MM-8MM UNIVERSAL (MISCELLANEOUS) ×3
SET TRI-LUMEN FLTR TB AIRSEAL (TUBING) ×2 IMPLANT
SOLUTION ELECTROLUBE (MISCELLANEOUS) ×2 IMPLANT
SPONGE DRAIN TRACH 4X4 STRL 2S (GAUZE/BANDAGES/DRESSINGS) ×2 IMPLANT
SURGILUBE 2OZ TUBE FLIPTOP (MISCELLANEOUS) ×2 IMPLANT
SUT DVC VLOC 180 0 12IN GS21 (SUTURE) ×2
SUT ETHILON 3-0 FS-10 30 BLK (SUTURE) ×2
SUT MNCRL 4-0 (SUTURE) ×1
SUT MNCRL 4-0 27XMFL (SUTURE) ×1
SUT VIC AB 2-0 CT1 27 (SUTURE) ×1
SUT VIC AB 2-0 CT1 TAPERPNT 27 (SUTURE) ×1 IMPLANT
SUT VIC AB 3-0 SH 27 (SUTURE) ×1
SUT VIC AB 3-0 SH 27X BRD (SUTURE) ×1 IMPLANT
SUT VICRYL 0 AB UR-6 (SUTURE) ×2 IMPLANT
SUT VLOC 90 S/L VL9 GS22 (SUTURE) ×10 IMPLANT
SUTURE DVC VLC 180 0 12IN GS21 (SUTURE) ×1 IMPLANT
SUTURE EHLN 3-0 FS-10 30 BLK (SUTURE) ×1 IMPLANT
SUTURE MNCRL 4-0 27XMF (SUTURE) ×1 IMPLANT
SYR 10ML LL (SYRINGE) ×6 IMPLANT
SYR 50ML LL SCALE MARK (SYRINGE) ×2 IMPLANT
TROCAR ENDO BLADELESS 11MM (ENDOMECHANICALS) ×2 IMPLANT

## 2021-02-18 NOTE — H&P (Signed)
GYNECOLOGY PREOPERATIVE HISTORY AND PHYSICAL   Subjective:  Sharon Herring is a 45 y.o. 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  She has been dealing with these issues for several years, however over the past year or so her symptoms have worsened.  Cycles have been She had been managing with Aygestin but then stopped last year after getting married as she desired to attempt to conceive. She has received now a total of 2 blood transfusions since March 2022. Attempted to use Lysteda over the past 2 months, notes cycle was slightly less heavy but more "clotty". She was admitted yesterday pre-operatively due to significant anemia noted on her preoperatice lbs  Her Hgb was noted to be 5.9  she received a total of 3 units PRBCs overnight. Of note, she has a prior history of myomectomy in 2015.   Proposed surgery: Robotic myomectomy, chromopertubation    Pertinent Gynecological History: Menses: flow is excessive with use of 8 or more pads or tampons on heaviest days Bleeding: dysfunctional uterine bleeding Contraception: none Last mammogram: abnormal: bilateral adenopathy noted  Date: 01/01/2018.  Did not follow up.  Last pap: normal Date: 02/09/2019   Past Medical History:  Diagnosis Date   Anemia    Dizziness    GERD (gastroesophageal reflux disease)    History of thrombocytosis    History of uterine fibroid 03/14/2014   myomectomy   IBS (irritable bowel syndrome)    Migraine    Polyp of stomach 01/06/2016   Stomach polyp, pyloric, consistent with prolapse.     Past Surgical History:  Procedure Laterality Date   APPENDECTOMY  2012   CHOLECYSTECTOMY     ESOPHAGOGASTRODUODENOSCOPY (EGD) WITH PROPOFOL N/A 01/06/2016   Procedure: ESOPHAGOGASTRODUODENOSCOPY (EGD) WITH PROPOFOL;  Surgeon: Robert Bellow, MD;  Location: [facility] ENDOSCOPY;  Service: Endoscopy;  Laterality:  N/A;   HERNIA REPAIR     ROBOTIC ASSISTED LAPAROSCOPIC CHOLECYSTECTOMY N/A 03/03/2016   Procedure: ROBOTIC ASSISTED LAPAROSCOPIC CHOLECYSTECTOMY ;  Surgeon: Clayburn Pert, MD;  Location: [facility] ORS;  Service: General;  Laterality: N/A;   UTERINE FIBROID SURGERY  2012   XI ROBOTIC ASSISTED VENTRAL HERNIA N/A 07/15/2019   Procedure: XI ROBOTIC ASSISTED VENTRAL HERNIA;  Surgeon: Jules Husbands, MD;  Location: [facility] ORS;  Service: General;  Laterality: N/A;    OB History  Gravida Para Term Preterm AB Living  1 0 0 0 1 0  SAB IAB Ectopic Multiple Live Births  1 0 0 0      # Outcome Date GA Lbr Len/2nd Weight Sex Delivery Anes PTL Lv  1 SAB 2022            Obstetric Comments  1st Menstrual Cycle:  15      Patient denies any other pertinent gynecologic issues.  Family History  Problem Relation Age of Onset   Hypertension Mother    Hypertension Father    Pancreatic cancer Father 95   Breast cancer Neg Hx    Colon cancer Neg Hx     Social History   Socioeconomic History   Marital status: Married    Spouse name: Not on file   Number of children: 0   Years of education: Not on file   Highest education level: Bachelor's degree (e.g., BA, AB, BS)  Occupational History   Occupation: CMA  Tobacco Use   Smoking status: Never   Smokeless tobacco: Never  Vaping Use  Vaping Use: Never used  Substance and Sexual Activity   Alcohol use: No    Alcohol/week: 0.0 standard drinks   Drug use: No   Sexual activity: Yes    Partners: Male    Birth control/protection: None  Other Topics Concern   Not on file  Social History Narrative   Lives alone, works at [employer] Strain: Not on file  Food Insecurity: Not on file  Transportation Needs: Not on file  Physical Activity: Not on file  Stress: Not on file  Social Connections: Not on file  Intimate Partner Violence: Not on file    No current facility-administered medications  on file prior to encounter.   Current Outpatient Medications on File Prior to Encounter  Medication Sig Dispense Refill   albuterol (PROVENTIL) (2.5 MG/3ML) 0.083% nebulizer solution Take 3 mLs (2.5 mg total) by nebulization every 6 (six) hours as needed for wheezing or shortness of breath. 150 mL 1   albuterol (VENTOLIN HFA) 108 (90 Base) MCG/ACT inhaler Inhale 2 puffs into the lungs every 6 (six) hours as needed for wheezing or shortness of breath. 18 g 0   ferrous sulfate (FERROUSUL) 325 (65 FE) MG tablet Take 1 tablet (325 mg total) by mouth 3 (three) times daily with meals. (Patient taking differently: Take 325 mg by mouth daily.) 90 tablet 1   fluticasone (FLONASE) 50 MCG/ACT nasal spray Place 2 sprays into both nostrils daily. (Patient taking differently: Place 2 sprays into both nostrils daily as needed for allergies.) 16 g 6   furosemide (LASIX) 20 MG tablet Take 1 tablet (20 mg total) by mouth daily. (Patient taking differently: Take 20 mg by mouth daily as needed for fluid.) 30 tablet 1   ibuprofen (ADVIL) 800 MG tablet Take 1 tablet (800 mg total) by mouth every 8 (eight) hours as needed. (Patient taking differently: Take 800 mg by mouth every 8 (eight) hours as needed for moderate pain.) 30 tablet 0   linaclotide (LINZESS) 290 MCG CAPS capsule Take 1 capsule (290 mcg total) by mouth daily before breakfast. (Patient taking differently: Take 290 mcg by mouth daily as needed (constipation).) 30 capsule 2   Multiple Vitamins-Minerals (MULTIVITAMIN ADULT) TABS Take 1 tablet by mouth daily. 30 tablet 0   Allergies  Allergen Reactions   Aspirin Nausea And Vomiting   Oxycodone-Acetaminophen Rash and Shortness Of Breath   Lexapro [Escitalopram] Palpitations   Tape Other (See Comments)    Burning feeling. Has left mark on skin.   Belviq [Lorcaserin Hcl] Other (See Comments)    headache   Dicyclomine Nausea And Vomiting   Contrave [Naltrexone-Bupropion Hcl Er] Anxiety    Altered mental  state   Norethindrone Rash      Review of Systems Constitutional: No recent fever/chills/sweats. Positive for fatigue Respiratory: No recent cough/bronchitis Cardiovascular: No chest pain Gastrointestinal: No recent nausea/vomiting/diarrhea Genitourinary: No UTI symptoms Hematologic/lymphatic:No history of coagulopathy or recent blood thinner use    Objective:   Blood pressure 136/85, pulse 65, temperature (!) 97.2 F (36.2 C), temperature source Tympanic, resp. rate 14, height 5\' 5"  (1.651 m), weight 89.4 kg, last menstrual period 02/10/2021, SpO2 100 %. CONSTITUTIONAL: Well-developed, well-nourished female in no acute distress.  HENT:  Normocephalic, atraumatic, External right and left ear normal. Oropharynx is clear and moist EYES: Conjunctivae and EOM are normal. Pupils are equal, round, and reactive to light. No scleral icterus.  NECK: Normal range of motion, supple, no  masses SKIN: Skin is warm and dry. No rash noted. Not diaphoretic. No erythema. No pallor. NEUROLOGIC: Alert and oriented to person, place, and time. Normal reflexes, muscle tone coordination. No cranial nerve deficit noted. PSYCHIATRIC: Normal mood and affect. Normal behavior. Normal judgment and thought content. CARDIOVASCULAR: Normal heart rate noted, regular rhythm RESPIRATORY: Effort and breath sounds normal, no problems with respiration noted ABDOMEN: Soft, nontender, nondistended. Uterus palpable to 14-16 week size (midway between umbilicus and pubic symphysis) PELVIC: Deferred MUSCULOSKELETAL: Normal range of motion. No edema and no tenderness. 2+ distal pulses.    Labs:  CBC Latest Ref Rng & Units 02/18/2021 02/16/2021 10/26/2020  WBC 4.0 - 10.5 K/uL - 5.1 -  Hemoglobin 12.0 - 15.0 g/dL 9.2(L) 5.9(L) 7.1(L)  Hematocrit 36.0 - 46.0 % 25.0(L) 21.2(L) RESULTS UNAVAILABLE DUE TO INTERFERING SUBSTANCE  Platelets 150 - 400 K/uL - 1,096(HH) -     CMP Latest Ref Rng & Units 02/16/2021  Glucose 70 - 99  mg/dL 98  BUN 6 - 20 mg/dL 10  Creatinine 0.44 - 1.00 mg/dL 0.67  Sodium 135 - 145 mmol/L 139  Potassium 3.5 - 5.1 mmol/L 3.7  Chloride 98 - 111 mmol/L 108  CO2 22 - 32 mmol/L 27  Calcium 8.9 - 10.3 mg/dL 8.8(L)  Total Protein 6.5 - 8.1 g/dL 7.4  Total Bilirubin 0.3 - 1.2 mg/dL 0.3  Alkaline Phos 38 - 126 U/L 62  AST 15 - 41 U/L 16  ALT 0 - 44 U/L 9     Lab Results  Component Value Date   TSH 2.210 01/13/2021    Results for FORREST, DEMURO (MRN [id number]) as of 02/18/2021 07:26  Ref. Range 02/18/2021 03:20  Preg Test, Ur Latest Ref Range: NEGATIVE  NEGATIVE    Results for LEAHANNA, BUSER (MRN [id number]) as of 02/18/2021 07:26  Ref. Range 01/13/2021 15:52  LH Latest Units: mIU/mL 4.7  FSH Latest Units: mIU/mL 4.7  ANTI-MULLERIAN HORMONE ([facility]) Latest Units: ng/mL 0.396  Estradiol Latest Units: pg/mL 20.3  Progesterone Latest Units: ng/mL 0.5    Pathology:  Endometrial biopsy, performed 10/22/2020 A. ENDOMETRIUM, BIOPSY:  - Benign endometrium with breakdown  - Negative for hyperplasia or malignancy    Imaging Studies: US PELVIS TRANSVAGINAL NON-OB (TV ONLY) CLINICAL DATA:  Initial evaluation for dysfunctional uterine bleeding, history of fibroid uterus.  EXAM: TRANSABDOMINAL AND TRANSVAGINAL ULTRASOUND OF PELVIS  TECHNIQUE: Both transabdominal and transvaginal ultrasound examinations of the pelvis were performed. Transabdominal technique was performed for global imaging of the pelvis including uterus, ovaries, adnexal regions, and pelvic cul-de-sac. It was necessary to proceed with endovaginal exam following the transabdominal exam to visualize the uterus, endometrium, and ovaries.  COMPARISON:  Prior ultrasound from 11/07/2018  FINDINGS: Uterus  Measurements: 14.0 x 10.3 x 12.2 cm = volume: 917.9 mL. Uterus is anteverted with at least 7 separate fibroids visualized, largest of which are measured. 5.4 x 4.0 x 4.6 cm submucosal fibroid at the posterior uterine  body. 4.5 x 4.3 x 4.3 cm submucosal fibroid at the anterior uterine body. 4.6 x 3.4 x 4.0 cm intramural fibroid at the right uterine body.  Endometrium  Thickness: 7 mm. Endometrial complex is somewhat deformed and obscured by adjacent uterine fibroids, with no definite focal abnormality.  Right ovary  Not visualized.  No adnexal mass.  Left ovary  Measurements: 3.9 x 2.9 x 2.1 cm = volume: 12.4 mL. 2.2 x 2.0 x 1.8 cm dominant follicle noted. No other adnexal mass.  Other findings  No abnormal free fluid.  IMPRESSION: 1. Enlarged fibroid uterus as detailed above. 2. Endometrial stripe measures up to 7 mm in thickness. If bleeding remains unresponsive to hormonal or medical therapy, sonohysterogram should be considered for focal lesion work-up. (Ref: Radiological Reasoning: Algorithmic Workup of Abnormal Vaginal Bleeding with Endovaginal Sonography and Sonohysterography. AJR 2008; 161:W96-04). 3. Normal left ovary, with nonvisualization of the right ovary. No adnexal mass.  Electronically Signed   By: Jeannine Boga M.D.   On: 11/02/2020 19:11 US PELVIS (TRANSABDOMINAL ONLY) CLINICAL DATA:  Initial evaluation for dysfunctional uterine bleeding, history of fibroid uterus.  EXAM: TRANSABDOMINAL AND TRANSVAGINAL ULTRASOUND OF PELVIS  TECHNIQUE: Both transabdominal and transvaginal ultrasound examinations of the pelvis were performed. Transabdominal technique was performed for global imaging of the pelvis including uterus, ovaries, adnexal regions, and pelvic cul-de-sac. It was necessary to proceed with endovaginal exam following the transabdominal exam to visualize the uterus, endometrium, and ovaries.  COMPARISON:  Prior ultrasound from 11/07/2018  FINDINGS: Uterus  Measurements: 14.0 x 10.3 x 12.2 cm = volume: 917.9 mL. Uterus is anteverted with at least 7 separate fibroids visualized, largest of which are measured. 5.4 x 4.0 x 4.6 cm submucosal fibroid  at the posterior uterine body. 4.5 x 4.3 x 4.3 cm submucosal fibroid at the anterior uterine body. 4.6 x 3.4 x 4.0 cm intramural fibroid at the right uterine body.  Endometrium  Thickness: 7 mm. Endometrial complex is somewhat deformed and obscured by adjacent uterine fibroids, with no definite focal abnormality.  Right ovary  Not visualized.  No adnexal mass.  Left ovary  Measurements: 3.9 x 2.9 x 2.1 cm = volume: 12.4 mL. 2.2 x 2.0 x 1.8 cm dominant follicle noted. No other adnexal mass.  Other findings  No abnormal free fluid.  IMPRESSION: 1. Enlarged fibroid uterus as detailed above. 2. Endometrial stripe measures up to 7 mm in thickness. If bleeding remains unresponsive to hormonal or medical therapy, sonohysterogram should be considered for focal lesion work-up. (Ref: Radiological Reasoning: Algorithmic Workup of Abnormal Vaginal Bleeding with Endovaginal Sonography and Sonohysterography. AJR 2008; 540:J81-19). 3. Normal left ovary, with nonvisualization of the right ovary. No adnexal mass.  Electronically Signed   By: Jeannine Boga M.D.   On: 11/02/2020 19:11   Assessment:    Uterine fibroids History of myomectomy Iron deficiency anemia History of blood transfusion Primary infertility   Plan:   - Counseling: Procedure, risks, reasons, benefits and complications (including injury to bowel, bladder, major blood vessel, ureter, bleeding, possibility of transfusion, infection, or fistula formation), and possible laparotomy reviewed in detail. Likelihood of success in alleviating the patient's condition was discussed. Routine postoperative instructions will be reviewed with the patient and her family in detail after surgery.  The patient concurred with the proposed plan, giving informed written consent for the surgery.   - Preop labs reviewed, Hgb improved from 5.9 to 9.2 with 3 units PRBCs. [place] with 2 additional units on standby for procedure. Patient did  note mild reaction to blood yesterday (back pain) which has resolved.  - Has been NPO since midnight. - Plan for discharge home later today after procedure.      Rubie Maid, MD [facility]

## 2021-02-18 NOTE — Anesthesia Procedure Notes (Signed)
Procedure Name: Intubation Date/Time: 02/18/2021 7:44 AM Performed by: Aline Brochure, CRNA Pre-anesthesia Checklist: Patient identified, Patient being monitored, Timeout performed, Emergency Drugs available and Suction available Patient Re-evaluated:Patient Re-evaluated prior to induction Oxygen Delivery Method: Circle system utilized Preoxygenation: Pre-oxygenation with 100% oxygen Induction Type: IV induction Ventilation: Mask ventilation without difficulty Laryngoscope Size: 3 and McGraph Grade View: Grade I Tube type: Oral Tube size: 7.0 mm Number of attempts: 1 Airway Equipment and Method: Stylet and Video-laryngoscopy Placement Confirmation: ETT inserted through vocal cords under direct vision, positive ETCO2 and breath sounds checked- equal and bilateral Secured at: 21 cm Tube secured with: Tape Dental Injury: Teeth and Oropharynx as per pre-operative assessment

## 2021-02-18 NOTE — Progress Notes (Signed)
Notified provider of of Hct 9.2 and Hgb 25

## 2021-02-18 NOTE — Transfer of Care (Signed)
Immediate Anesthesia Transfer of Care Note  Patient: Sharon Herring  Procedure(s) Performed: XI ROBOTIC ASSISTED MYOMECTOMY CHROMOPERTUBATION  Patient Location: PACU  Anesthesia Type:General  Level of Consciousness: sedated  Airway & Oxygen Therapy: Patient Spontanous Breathing and Patient connected to face mask oxygen  Post-op Assessment: Report given to RN and Post -op Vital signs reviewed and stable  Post vital signs: Reviewed and stable  Last Vitals:  Vitals Value Taken Time  BP 120/78 02/18/21 1335  Temp    Pulse 78 02/18/21 1338  Resp 17 02/18/21 1338  SpO2 100 % 02/18/21 1338  Vitals shown include unvalidated device data.  Last Pain:  Vitals:   02/18/21 0655  TempSrc: Tympanic  PainSc: 3          Complications: No notable events documented.

## 2021-02-18 NOTE — Anesthesia Preprocedure Evaluation (Signed)
Anesthesia Evaluation  Patient identified by MRN, date of birth, ID band Patient awake    Reviewed: Allergy & Precautions, H&P , NPO status , Patient's Chart, lab work & pertinent test results, reviewed documented beta blocker date and time   Airway Mallampati: II  TM Distance: >3 FB Neck ROM: full    Dental  (+) Teeth Intact   Pulmonary sleep apnea ,    Pulmonary exam normal        Cardiovascular negative cardio ROS Normal cardiovascular exam Rhythm:regular Rate:Normal     Neuro/Psych  Headaches, negative psych ROS   GI/Hepatic Neg liver ROS, hiatal hernia, GERD  ,  Endo/Other  negative endocrine ROS  Renal/GU negative Renal ROS  negative genitourinary   Musculoskeletal   Abdominal   Peds  Hematology  (+) Blood dyscrasia, anemia ,   Anesthesia Other Findings Past Medical History: No date: Anemia No date: Dizziness No date: Fibroid uterus No date: GERD (gastroesophageal reflux disease) No date: History of thrombocytosis 03/14/2014: History of uterine fibroid     Comment:  myomectomy No date: IBS (irritable bowel syndrome) No date: Migraine 01/06/2016: Polyp of stomach     Comment:  Stomach polyp, pyloric, consistent with prolapse.  Past Surgical History: 2012: APPENDECTOMY No date: CHOLECYSTECTOMY 01/06/2016: ESOPHAGOGASTRODUODENOSCOPY (EGD) WITH PROPOFOL; N/A     Comment:  Procedure: ESOPHAGOGASTRODUODENOSCOPY (EGD) WITH               PROPOFOL;  Surgeon: Robert Bellow, MD;  Location:               [facility] ENDOSCOPY;  Service: Endoscopy;  Laterality: N/A; No date: HERNIA REPAIR 03/03/2016: ROBOTIC ASSISTED LAPAROSCOPIC CHOLECYSTECTOMY; N/A     Comment:  Procedure: ROBOTIC ASSISTED LAPAROSCOPIC CHOLECYSTECTOMY              ;  Surgeon: Clayburn Pert, MD;  Location: [facility] ORS;                Service: General;  Laterality: N/A; 2012: UTERINE FIBROID SURGERY 07/15/2019: XI ROBOTIC ASSISTED VENTRAL HERNIA;  N/A     Comment:  Procedure: XI ROBOTIC ASSISTED VENTRAL HERNIA;  Surgeon:              Jules Husbands, MD;  Location: [facility] ORS;  Service:               General;  Laterality: N/A; BMI    Body Mass Index: 32.78 kg/m     Reproductive/Obstetrics negative OB ROS                             Anesthesia Physical Anesthesia Plan  ASA: 2  Anesthesia Plan: General ETT   Post-op Pain Management:    Induction:   PONV Risk Score and Plan:   Airway Management Planned:   Additional Equipment:   Intra-op Plan:   Post-operative Plan:   Informed Consent: I have reviewed the patients History and Physical, chart, labs and discussed the procedure including the risks, benefits and alternatives for the proposed anesthesia with the patient or authorized representative who has indicated his/her understanding and acceptance.     Dental Advisory Given  Plan Discussed with: CRNA  Anesthesia Plan Comments:         Anesthesia Quick Evaluation

## 2021-02-18 NOTE — Op Note (Signed)
Procedure(s): XI ROBOTIC ASSISTED MYOMECTOMY CHROMOPERTUBATION Procedure Note  Sharon Herring female 45 y.o. 02/18/2021  Indications: The patient is a 45 y.o. [id number] female with fibroid uterus, menorrhagia, history of anemia requiring blood transfusion, primary infertility  Pre-operative Diagnosis: Fibroid uterus, menorrhagia, history of anemia requiring blood transfusion, primary infertility. Also with prior history of previous myomectomy.   Post-operative Diagnosis: Same  Surgeon: Rubie Maid, MD  Assistants:  Jeannie Fend, MD. An experienced assistant was required given the standard of surgical care given the complexity of the case.  This assistant was needed for exposure, dissection, suctioning, retraction, instrument exchange, and for overall help during the procedure.  Anesthesia: General endotracheal anesthesia   Findings: The uterus was sounded to 9 cm Fallopian tubes and ovaries appeared normal. Tubal patency confirmed with chormopertubation Enlarged fibroid uterus  Few adhesions of the omentum to the upper anterior abdominal wall.   Procedure Details: The patient was seen in the Holding Room. The risks, benefits, complications, treatment options, and expected outcomes were discussed with the patient.  The patient concurred with the proposed plan, giving informed consent.  The site of surgery properly noted/marked. The patient was taken to the Operating Room, identified as Sharon Herring and the procedure verified as Procedure(s) (LRB): XI ROBOTIC ASSISTED MYOMECTOMY (N/A) CHROMOPERTUBATION (N/A). A Time Out was held and the above information confirmed.  She was then placed under general anesthesia without difficulty. She was placed in the dorsal lithotomy position, and was prepped and draped in a sterile manner.  A foley catheter was placed. A sterile speculum was inserted into the vagina and the cervix was grasped at the anterior lip using a single-toothed tenaculum.  The  uterus was sounded to 9 cm, and a Humi device was placed for uterine manipulation.  The speculum and tenaculum were then removed. A  Attention was then turned to the abdomen, where  an 8 mm incision was made supraumubilcally.  An 57mm robotic trochar and 5 mm Optiview were then inserted into the abdomen under direct visualization.  The daVinci camera was then placed supraumbilically. Three more ports were then placed. There were two 8 mm ports that were placed 10 cm laterally to the umbilicus and 2 cm inferiorly on either side.  The 12 mm assistant port was then placed in the left upper quadrant  at McBurney's point. All incisions were injected with local anesthetic (Sensorcaine 0.5%, total of 20 cc) prior to port placement. The daVinci robot was then docked in the normal fashion. The patient was placed in steep Trendelenburg positioning.  Inspection of the pelvis showed an enlarged fibroid uterus with multiple fibroids. Fallopian tubes and ovaries appeared normal.   Vasopressin solution was injected over the surface of the leiomyoma to aid with hemostasis.   A vertical incision was made over the uterine leiomyoma into the leiomyoma, and the capsule was recognized.  Using blunt methods, the leiomyoma was freed from the surrounding myometrial tissue and removed intact. The incision was closed in multiple layers, using 2-0 Vicryl V-lock sutures.  Fibrillar was placed inside of the layers to aid with hemostasis. Overall good hemostasis was noted. During dissection of one of the posterior fibroids, it was noted that the endometrial cavity had been breeched with partial visualization of the [facility] manipulator.   After completion of the myomectomy the fibroids were placed in a large Endocatch bag.  Several sheets of Surgicele were placed over the uterine incisions to aid in hemostasis and potenitally act as an adhesion barrier.  The robot was then undocked, and attention was turned to the abdomen where the the fibroids  were morcellated using sharp dissection with the scalpel and mayo scissors and brought through the incision.  The 12 mm incision was extended to allow for morcellation and removal of the fibroids.  After removal of the fibroids, a J-P drain was placed into the abdomen through the incision.  The fascia was grasped with Kocher clamps and reapproximated with 0-Vicryl running stitch and the subcutaneous layer was reapproximated with 3-0 Vicryl suture in a running fashion. The J-P drain was secured with a 3-0 silk suture. The robotic camera was then reinserted into the abdomen.  Chromopertubation was performed with infusion of ~ 40 ml of methylene blue diluted with saline into the [facility] manipulator with spillage noted bilaterally from the fallopian tubes. The The skin of all incisions were closed with Dermabond. The patient tolerated the procedure well. There were no complications during this case.  Sponge, lap, needle and instrument counts were correct x 2.  The patient was taken to the recovery room extubated and in stable condition.   Estimated Blood Loss:  150 ml      Drains: foley catheterization with 400 ml of clear urine         Total IV Fluids: 1900 ml  Specimens: Uterine leiomyoma         Implants: None         Complications:  None; patient tolerated the procedure well.         Disposition: PACU - hemodynamically stable.         Condition: stable   Rubie Maid, MD [facility]

## 2021-02-18 NOTE — Progress Notes (Signed)
One of the patient's sites where the JP drain is placed was actively oozing blood. I contacted Dr. Marcelline Mates and stated to remove dressing and place an abdominal binder which I did. The site was good prior to placing the binder.

## 2021-02-19 ENCOUNTER — Inpatient Hospital Stay: Payer: 59

## 2021-02-19 ENCOUNTER — Encounter: Payer: Self-pay | Admitting: Obstetrics and Gynecology

## 2021-02-19 DIAGNOSIS — Y838 Other surgical procedures as the cause of abnormal reaction of the patient, or of later complication, without mention of misadventure at the time of the procedure: Secondary | ICD-10-CM | POA: Diagnosis present

## 2021-02-19 DIAGNOSIS — Z8249 Family history of ischemic heart disease and other diseases of the circulatory system: Secondary | ICD-10-CM | POA: Diagnosis not present

## 2021-02-19 DIAGNOSIS — K660 Peritoneal adhesions (postprocedural) (postinfection): Secondary | ICD-10-CM | POA: Diagnosis present

## 2021-02-19 DIAGNOSIS — I2694 Multiple subsegmental pulmonary emboli without acute cor pulmonale: Secondary | ICD-10-CM

## 2021-02-19 DIAGNOSIS — D509 Iron deficiency anemia, unspecified: Secondary | ICD-10-CM | POA: Diagnosis present

## 2021-02-19 DIAGNOSIS — N920 Excessive and frequent menstruation with regular cycle: Secondary | ICD-10-CM | POA: Diagnosis present

## 2021-02-19 DIAGNOSIS — Z800 Family history of malignant neoplasm of digestive organs: Secondary | ICD-10-CM | POA: Diagnosis not present

## 2021-02-19 DIAGNOSIS — E669 Obesity, unspecified: Secondary | ICD-10-CM | POA: Diagnosis present

## 2021-02-19 DIAGNOSIS — D62 Acute posthemorrhagic anemia: Secondary | ICD-10-CM

## 2021-02-19 DIAGNOSIS — J9601 Acute respiratory failure with hypoxia: Secondary | ICD-10-CM | POA: Diagnosis present

## 2021-02-19 DIAGNOSIS — Z87820 Personal history of traumatic brain injury: Secondary | ICD-10-CM | POA: Diagnosis not present

## 2021-02-19 DIAGNOSIS — Z20822 Contact with and (suspected) exposure to covid-19: Secondary | ICD-10-CM | POA: Diagnosis present

## 2021-02-19 DIAGNOSIS — Z9889 Other specified postprocedural states: Secondary | ICD-10-CM

## 2021-02-19 DIAGNOSIS — Z6832 Body mass index (BMI) 32.0-32.9, adult: Secondary | ICD-10-CM | POA: Diagnosis not present

## 2021-02-19 DIAGNOSIS — I2699 Other pulmonary embolism without acute cor pulmonale: Secondary | ICD-10-CM | POA: Diagnosis present

## 2021-02-19 DIAGNOSIS — T81718A Complication of other artery following a procedure, not elsewhere classified, initial encounter: Secondary | ICD-10-CM | POA: Diagnosis present

## 2021-02-19 DIAGNOSIS — D251 Intramural leiomyoma of uterus: Secondary | ICD-10-CM | POA: Diagnosis present

## 2021-02-19 DIAGNOSIS — D250 Submucous leiomyoma of uterus: Secondary | ICD-10-CM | POA: Diagnosis present

## 2021-02-19 DIAGNOSIS — D500 Iron deficiency anemia secondary to blood loss (chronic): Secondary | ICD-10-CM | POA: Diagnosis not present

## 2021-02-19 DIAGNOSIS — D259 Leiomyoma of uterus, unspecified: Secondary | ICD-10-CM | POA: Diagnosis not present

## 2021-02-19 DIAGNOSIS — Z86018 Personal history of other benign neoplasm: Secondary | ICD-10-CM | POA: Diagnosis not present

## 2021-02-19 DIAGNOSIS — R5082 Postprocedural fever: Secondary | ICD-10-CM | POA: Diagnosis present

## 2021-02-19 LAB — CBC WITH DIFFERENTIAL/PLATELET
Abs Immature Granulocytes: 0.09 10*3/uL — ABNORMAL HIGH (ref 0.00–0.07)
Basophils Absolute: 0 10*3/uL (ref 0.0–0.1)
Basophils Relative: 0 %
Eosinophils Absolute: 0.1 10*3/uL (ref 0.0–0.5)
Eosinophils Relative: 1 %
HCT: 22.6 % — ABNORMAL LOW (ref 36.0–46.0)
Hemoglobin: 7.8 g/dL — ABNORMAL LOW (ref 12.0–15.0)
Immature Granulocytes: 1 %
Lymphocytes Relative: 7 %
Lymphs Abs: 1.1 10*3/uL (ref 0.7–4.0)
MCH: 23.1 pg — ABNORMAL LOW (ref 26.0–34.0)
MCHC: 34.5 g/dL (ref 30.0–36.0)
MCV: 67.1 fL — ABNORMAL LOW (ref 80.0–100.0)
Monocytes Absolute: 0.9 10*3/uL (ref 0.1–1.0)
Monocytes Relative: 5 %
Neutro Abs: 14.9 10*3/uL — ABNORMAL HIGH (ref 1.7–7.7)
Neutrophils Relative %: 86 %
Platelets: 649 10*3/uL — ABNORMAL HIGH (ref 150–400)
RBC: 3.37 MIL/uL — ABNORMAL LOW (ref 3.87–5.11)
RDW: 29.4 % — ABNORMAL HIGH (ref 11.5–15.5)
Smear Review: UNDETERMINED
WBC: 17.2 10*3/uL — ABNORMAL HIGH (ref 4.0–10.5)
nRBC: 0 % (ref 0.0–0.2)

## 2021-02-19 LAB — D-DIMER, QUANTITATIVE: D-Dimer, Quant: 18.22 ug/mL-FEU — ABNORMAL HIGH (ref 0.00–0.50)

## 2021-02-19 LAB — COMPREHENSIVE METABOLIC PANEL
ALT: 9 U/L (ref 0–44)
AST: 19 U/L (ref 15–41)
Albumin: 3 g/dL — ABNORMAL LOW (ref 3.5–5.0)
Alkaline Phosphatase: 47 U/L (ref 38–126)
Anion gap: 7 (ref 5–15)
BUN: 9 mg/dL (ref 6–20)
CO2: 25 mmol/L (ref 22–32)
Calcium: 8.2 mg/dL — ABNORMAL LOW (ref 8.9–10.3)
Chloride: 103 mmol/L (ref 98–111)
Creatinine, Ser: 0.75 mg/dL (ref 0.44–1.00)
GFR, Estimated: >60 mL/min (ref >60)
Glucose, Bld: 115 mg/dL — ABNORMAL HIGH (ref 70–99)
Potassium: 3.9 mmol/L (ref 3.5–5.1)
Sodium: 135 mmol/L (ref 135–145)
Total Bilirubin: 0.6 mg/dL (ref 0.3–1.2)
Total Protein: 6.3 g/dL — ABNORMAL LOW (ref 6.5–8.1)

## 2021-02-19 LAB — PROTIME-INR
INR: 1.3 — ABNORMAL HIGH (ref 0.8–1.2)
Prothrombin Time: 16.3 seconds — ABNORMAL HIGH (ref 11.4–15.2)

## 2021-02-19 LAB — APTT: aPTT: 32 seconds (ref 24–36)

## 2021-02-19 LAB — CBC
HCT: 22.9 % — ABNORMAL LOW (ref 36.0–46.0)
Hemoglobin: 7.9 g/dL — ABNORMAL LOW (ref 12.0–15.0)
MCH: 23.6 pg — ABNORMAL LOW (ref 26.0–34.0)
MCHC: 34.5 g/dL (ref 30.0–36.0)
MCV: 68.4 fL — ABNORMAL LOW (ref 80.0–100.0)
Platelets: 358 10*3/uL (ref 150–400)
RBC: 3.35 MIL/uL — ABNORMAL LOW (ref 3.87–5.11)
RDW: 29.5 % — ABNORMAL HIGH (ref 11.5–15.5)
WBC: 17.1 10*3/uL — ABNORMAL HIGH (ref 4.0–10.5)
nRBC: 0.1 % (ref 0.0–0.2)

## 2021-02-19 LAB — SURGICAL PATHOLOGY

## 2021-02-19 MED ORDER — ENOXAPARIN SODIUM 100 MG/ML IJ SOSY: 1.0000 mg/kg | PREFILLED_SYRINGE | Freq: Two times a day (BID) | INTRAMUSCULAR | Status: DC

## 2021-02-19 MED ORDER — HYDROMORPHONE HCL 2 MG PO TABS: 2.0000 mg | ORAL_TABLET | ORAL | 0 refills | Status: DC | PRN

## 2021-02-19 MED ORDER — DOCUSATE SODIUM 100 MG PO CAPS: 100.0000 mg | ORAL_CAPSULE | Freq: Two times a day (BID) | ORAL | 0 refills | Status: DC

## 2021-02-19 MED ORDER — SIMETHICONE 80 MG PO CHEW: 80.0000 mg | CHEWABLE_TABLET | Freq: Four times a day (QID) | ORAL | 0 refills | Status: DC | PRN

## 2021-02-19 MED ORDER — IOHEXOL 350 MG/ML SOLN
75.0000 mL | Freq: Once | INTRAVENOUS | Status: AC | PRN
  Administered 2021-02-19: 75 mL via INTRAVENOUS

## 2021-02-19 MED ORDER — HEPARIN BOLUS VIA INFUSION
5000.0000 [IU] | Freq: Once | INTRAVENOUS | Status: AC
  Administered 2021-02-19: 5000 [IU] via INTRAVENOUS
  Filled 2021-02-19: qty 5000

## 2021-02-19 MED ORDER — HEPARIN (PORCINE) 25000 UT/250ML-% IV SOLN
1300.0000 [IU]/h | INTRAVENOUS | Status: DC
  Administered 2021-02-19 – 2021-02-21 (×3): 1300 [IU]/h via INTRAVENOUS
  Filled 2021-02-19 (×5): qty 250

## 2021-02-19 MED ORDER — ENOXAPARIN SODIUM 150 MG/ML IJ SOSY
1.5000 mg/kg | PREFILLED_SYRINGE | INTRAMUSCULAR | Status: DC
  Filled 2021-02-19: qty 0.9

## 2021-02-19 MED ORDER — ACETAMINOPHEN 500 MG PO TABS: 1000.0000 mg | ORAL_TABLET | Freq: Four times a day (QID) | ORAL | 0 refills | Status: DC

## 2021-02-19 NOTE — Consult Note (Addendum)
[facility] Consult Note  WHITLEE SLUDER KZS:[id number] DOB: Sep 14, 1975 DOA: 02/17/2021  PCP: Steele Sizer, MD  Outpatient Specialists: Dr. Manuella Ghazi, neurology Patient coming from: home   I have personally briefly reviewed patient's old medical records in [place].  Chief Concern: pulmonary embolism   HPI: Sharon Herring is a 45 y.o. female with medical history significant for uterine fibroid, history of concussion without loss of consciousness, obesity, iron deficiency anemia, menorrhagia, s/p myomectomy on 02/18/21.  [facility] service has been consulted for pulmonary embolism.   At bedside patient is awake alert and oriented to her self, age, location, current calendar year.  She endorses mild tenderness at the abdomen site of surgery.  Otherwise, she states she is in her normal state of health.  She reports she has never had a history of blood clots before.  She denies chest pain, shortness of breath, nausea, vomiting, dysuria, hematuria, diarrhea.  ROS: Constitutional: no weight change, + fever ENT/Mouth: no sore throat, no rhinorrhea Eyes: no eye pain, no vision changes Cardiovascular: + chest pain, + dyspnea,  no edema, no palpitations Respiratory: no cough, no sputum, no wheezing Gastrointestinal: no nausea, no vomiting, no diarrhea, no constipation Genitourinary: no urinary incontinence, no dysuria, no hematuria Musculoskeletal: no arthralgias, no myalgias Skin: no skin lesions, no pruritus, Neuro: + weakness, no loss of consciousness, no syncope Psych: no anxiety, no depression, + decrease appetite Heme/Lymph: no bruising, no bleeding  Hospital course: Patient is status post day 1 of myomectomy for uterine fibroid.  Vitals at bedside show T-max of 100.4 today, at this time 99.1, respiration rate of 18, heart rate of 88, blood pressure 123/79, SPO2 of 99% on nasal cannula.  Patient was placed on nasal cannula however she did not need  it.  Assessment/Plan  Active Problems:   Anemia associated with acute blood loss   S/P myomectomy   # Pulmonary embolism-suspect secondary to surgery - Continue bilateral SCDs - Ultrasound of the lower extremity ordered to assess for DVT - Complete echo ordered - Heparin GTT initiated - Discussed with [place]o, she does not perform pulmonary thrombectomy however advises that as she is hemodynamically stable and not requiring increased oxygenation, she agrees with echo, heparin GTT, ultrasound to assess for DVT. - If echo is positive would recommend consult vascular on Tuesday - Patient has never had a clot in her lungs or legs, this is her first incident - I have discussed with patient regarding transitioning to p.o. anticoagulation for 3 to 6 months - Discussed with pharmacy, patient would benefit from being moved to medicine floor - Transfer order placed for progressive unit with telemetry - I discussed with Dr. Amalia Hailey, if patient develops respiratory distress, hypoxia, chest pain, hypotension, primary to consult [facility] for transfer immediately for a thrombectomy - [facility] will continue to follow  # Iron deficiency anemia in setting of blood loss - From a medicine standpoint, I recommend to maintain hemoglobin greater than 8 - Transfusion per primary  # Uterine fibroid and menorrhagia status post myomectomy - Postop day 1 - Per primary attending/OB/GYN service  Chart reviewed.   DVT prophylaxis: Heparin GTT Code Status: Full code Diet: Per primary Family Communication: Patient attempted to call her mother twice while I was in the room to explain, no pickup Disposition Plan: Pending clinical course and per primary  Past Medical History:  Diagnosis Date   Anemia    Dizziness    Fibroid uterus    GERD (gastroesophageal reflux  disease)    History of thrombocytosis    History of uterine fibroid 03/14/2014   myomectomy   IBS (irritable bowel syndrome)    Migraine     Polyp of stomach 01/06/2016   Stomach polyp, pyloric, consistent with prolapse.    Past Surgical History:  Procedure Laterality Date   APPENDECTOMY  2012   CHOLECYSTECTOMY     CHROMOPERTUBATION N/A 02/18/2021   Procedure: CHROMOPERTUBATION;  Surgeon: Rubie Maid, MD;  Location: [facility] ORS;  Service: Gynecology;  Laterality: N/A;   ESOPHAGOGASTRODUODENOSCOPY (EGD) WITH PROPOFOL N/A 01/06/2016   Procedure: ESOPHAGOGASTRODUODENOSCOPY (EGD) WITH PROPOFOL;  Surgeon: Robert Bellow, MD;  Location: [facility] ENDOSCOPY;  Service: Endoscopy;  Laterality: N/A;   HERNIA REPAIR     ROBOT ASSISTED MYOMECTOMY N/A 02/18/2021   Procedure: XI ROBOTIC ASSISTED MYOMECTOMY;  Surgeon: Rubie Maid, MD;  Location: [facility] ORS;  Service: Gynecology;  Laterality: N/A;   ROBOTIC ASSISTED LAPAROSCOPIC CHOLECYSTECTOMY N/A 03/03/2016   Procedure: ROBOTIC ASSISTED LAPAROSCOPIC CHOLECYSTECTOMY ;  Surgeon: Clayburn Pert, MD;  Location: [facility] ORS;  Service: General;  Laterality: N/A;   UTERINE FIBROID SURGERY  2012   XI ROBOTIC ASSISTED VENTRAL HERNIA N/A 07/15/2019   Procedure: XI ROBOTIC ASSISTED VENTRAL HERNIA;  Surgeon: Jules Husbands, MD;  Location: [facility] ORS;  Service: General;  Laterality: N/A;   Social History:  reports that she has never smoked. She has never used smokeless tobacco. She reports that she does not drink alcohol and does not use drugs.  Allergies  Allergen Reactions   Aspirin Nausea And Vomiting   Oxycodone-Acetaminophen Rash and Shortness Of Breath   Lexapro [Escitalopram] Palpitations   Tape Other (See Comments)    Burning feeling. Has left mark on skin.   Belviq [Lorcaserin Hcl] Other (See Comments)    headache   Dicyclomine Nausea And Vomiting   Contrave [Naltrexone-Bupropion Hcl Er] Anxiety    Altered mental state   Norethindrone Rash   Family History  Problem Relation Age of Onset   Hypertension Mother    Hypertension Father    Pancreatic cancer Father 88   Breast cancer Neg Hx     Colon cancer Neg Hx    Family history: Family history reviewed and not pertinent  Prior to Admission medications   Medication Sig Start Date End Date Taking? Authorizing Provider  albuterol (PROVENTIL) (2.5 MG/3ML) 0.083% nebulizer solution Take 3 mLs (2.5 mg total) by nebulization every 6 (six) hours as needed for wheezing or shortness of breath. 06/20/19  Yes Sowles, Drue Stager, MD  albuterol (VENTOLIN HFA) 108 (90 Base) MCG/ACT inhaler Inhale 2 puffs into the lungs every 6 (six) hours as needed for wheezing or shortness of breath. 09/02/20  Yes Sowles, Drue Stager, MD  ferrous sulfate (FERROUSUL) 325 (65 FE) MG tablet Take 1 tablet (325 mg total) by mouth 3 (three) times daily with meals. 10/26/20  Yes Rubie Maid, MD  fluticasone (FLONASE) 50 MCG/ACT nasal spray Place 2 sprays into both nostrils daily. Patient taking differently: Place 2 sprays into both nostrils daily as needed for allergies. 08/21/17  Yes Hawks, Christy A, FNP  furosemide (LASIX) 20 MG tablet Take 1 tablet (20 mg total) by mouth daily. Patient taking differently: Take 20 mg by mouth daily as needed for fluid. 07/17/18  Yes Sowles, Drue Stager, MD  ibuprofen (ADVIL) 800 MG tablet Take 1 tablet (800 mg total) by mouth every 8 (eight) hours as needed. 01/11/20  Yes Veronese, [place], MD  linaclotide [facility]) 290 MCG CAPS capsule Take 1 capsule (290 mcg  total) by mouth daily before breakfast. 09/16/19  Yes Sowles, Drue Stager, MD  Multiple Vitamins-Minerals (MULTIVITAMIN ADULT) TABS Take 1 tablet by mouth daily. 01/04/17  Yes Sowles, Drue Stager, MD  acetaminophen (TYLENOL) 500 MG tablet Take 2 tablets (1,000 mg total) by mouth every 6 (six) hours. 02/19/21   Rubie Maid, MD  docusate sodium (COLACE) 100 MG capsule Take 1 capsule (100 mg total) by mouth 2 (two) times daily. 02/19/21   Rubie Maid, MD  HYDROmorphone (DILAUDID) 2 MG tablet Take 1 tablet (2 mg total) by mouth every 4 (four) hours as needed for moderate pain or severe pain (may take up  to 2 tablets every 6 hours for severe pain). 02/19/21   Rubie Maid, MD  simethicone (MYLICON) 80 MG chewable tablet Chew 1 tablet (80 mg total) by mouth 4 (four) times daily as needed for flatulence. 02/19/21   Rubie Maid, MD   Physical Exam: Vitals:   02/19/21 1710 02/19/21 1820 02/19/21 1821 02/19/21 1955  BP:    123/79  Pulse:  (!) 109  93  Resp:    (!) 24  Temp:    99.1 F (37.3 C)  TempSrc:    Oral  SpO2: 94% (!) 87% 95% 99%  Weight:      Height:       Constitutional: appears age-appropriate, NAD, calm, comfortable Eyes: PERRL, lids and conjunctivae normal ENMT: Mucous membranes are moist. Posterior pharynx clear of any exudate or lesions. Age-appropriate dentition. Hearing appropriate Neck: normal, supple, no masses, no thyromegaly Respiratory: clear to auscultation bilaterally, no wheezing, no crackles. Normal respiratory effort. No accessory muscle use.  Cardiovascular: Regular rate and rhythm, no murmurs / rubs / gallops. No extremity edema. 2+ pedal pulses. No carotid bruits.  Abdomen: Obese abdomen, mild tenderness, no masses palpated, no hepatosplenomegaly. Bowel sounds positive.  Musculoskeletal: no clubbing / cyanosis. No joint deformity upper and lower extremities. Good ROM, no contractures, no atrophy. Normal muscle tone.  Skin: no rashes, lesions, ulcers. No induration Neurologic: Sensation intact. Strength 5/5 in all 4.  Psychiatric: Normal judgment and insight. Alert and oriented x 3. Normal mood.   EKG: Ordered  Chest x-ray on Admission: I personally reviewed and I agree with radiologist reading as below.  CT Angio Chest Pulmonary Embolism (PE) W or WO Contrast  Result Date: 02/19/2021 CLINICAL DATA:  Low oxygen saturation recent surgery EXAM: CT ANGIOGRAPHY CHEST WITH CONTRAST TECHNIQUE: Multidetector CT imaging of the chest was performed using the standard protocol during bolus administration of intravenous contrast. Multiplanar CT image reconstructions and  MIPs were obtained to evaluate the vascular anatomy. CONTRAST:  81mL OMNIPAQUE IOHEXOL 350 MG/ML SOLN COMPARISON:  Chest x-ray 01/11/2020, CT chest 01/11/2020 FINDINGS: Cardiovascular: Satisfactory opacification of the pulmonary arteries to the segmental level. Multiple acute filling defects within segmental and subsegmental right upper, right middle and right lower lobe pulmonary arteries. Small left lower lobe subsegmental emboli are also present. Nonaneurysmal aorta. No dissection seen. Borderline cardiomegaly. No pericardial effusion Mediastinum/Nodes: Midline trachea. No thyroid mass. No suspicious adenopathy. Esophagus within normal limits. Lungs/Pleura: Dependent atelectasis. Trace right-sided pleural effusion. No pneumothorax Upper Abdomen: No acute abnormality. Musculoskeletal: No chest wall abnormality. No acute or significant osseous findings. Review of the MIP images confirms the above findings. IMPRESSION: 1. Positive for acute right greater than left bilateral pulmonary emboli involving segmental and subsegmental vessels. 2. Trace right-sided pleural effusion with dependent atelectasis. Borderline cardiomegaly Critical Value/emergent results were called by telephone at the time of interpretation on 02/19/2021 at 6:56 pm  to provider Rubie Maid , who verbally acknowledged these results. Electronically Signed   By: Donavan Foil M.D.   On: 02/19/2021 18:56    Labs on Admission: I have personally reviewed following labs  CBC: Recent Labs  Lab 02/16/21 0833 02/18/21 0201 02/19/21 0706 02/19/21 1155  WBC 5.1  --  17.2* 17.1*  NEUTROABS  --   --  14.9*  --   HGB 5.9* 9.2* 7.8* 7.9*  HCT 21.2* 25.0* 22.6* 22.9*  MCV 60.4*  --  67.1* 68.4*  PLT 1,096*  --  649* 256   Basic Metabolic Panel: Recent Labs  Lab 02/16/21 0833  NA 139  K 3.7  CL 108  CO2 27  GLUCOSE 98  BUN 10  CREATININE 0.67  CALCIUM 8.8*   GFR: Estimated Creatinine Clearance: 100.2 mL/min (by C-G formula based on SCr  of 0.67 mg/dL).  Liver Function Tests: Recent Labs  Lab 02/16/21 0833  AST 16  ALT 9  ALKPHOS 62  BILITOT 0.3  PROT 7.4  ALBUMIN 3.6   Urine analysis:    Component Value Date/Time   COLORURINE RED (A) 05/14/2019 0426   APPEARANCEUR CLOUDY (A) 05/14/2019 0426   LABSPEC 1.028 05/14/2019 0426   PHURINE 6.0 05/14/2019 0426   GLUCOSEU NEGATIVE 05/14/2019 0426   HGBUR LARGE (A) 05/14/2019 0426   BILIRUBINUR neg 01/13/2021 1459   KETONESUR NEGATIVE 05/14/2019 0426   PROTEINUR Negative 01/13/2021 1459   PROTEINUR 100 (A) 05/14/2019 0426   UROBILINOGEN 0.2 01/13/2021 1459   UROBILINOGEN 1.0 11/10/2017 1720   NITRITE neg 01/13/2021 1459   NITRITE NEGATIVE 05/14/2019 0426   LEUKOCYTESUR Negative 01/13/2021 1459   LEUKOCYTESUR SMALL (A) 05/14/2019 0426   CRITICAL CARE Performed by: Briant Cedar Everest Hacking  Total critical care time: 40 minutes  Critical care time was exclusive of separately billable procedures and treating other patients.  Critical care was necessary to treat or prevent imminent or life-threatening deterioration. Circulatory failure  Critical care was time spent personally by me on the following activities: development of treatment plan with patient and/or surrogate as well as nursing, discussions with consultants, evaluation of patient's response to treatment, examination of patient, obtaining history from patient or surrogate, ordering and performing treatments and interventions, ordering and review of laboratory studies, ordering and review of radiographic studies, pulse oximetry and re-evaluation of patient's condition.  Dr. Tobie Poet [facility]  If 7PM-7AM, please contact overnight-coverage provider If 7AM-7PM, please contact day coverage provider [email or web address]  02/19/2021, 9:00 PM

## 2021-02-19 NOTE — Progress Notes (Addendum)
Post-Operative Day # 1, s/p robotic myomectomy, chromopertubation. HD#2 s/p admission for pre-operative blood transfusion for anemia due to menstrual blood loss. Received 3 units PRBCs.  Subjective: up ad lib, voiding, and tolerating PO. Reports pain controlled with pain medication. Fever noted overnight, low-grade.  Objective: Temp:  [98.2 F (36.8 C)-101.3 F (38.5 C)] 99.9 F (37.7 C) (07/01 0941) Pulse Rate:  [76-109] 108 (07/01 0942) Resp:  [8-35] 16 (07/01 0941) BP: (106-151)/(70-84) 117/74 (07/01 0941) SpO2:  [88 %-100 %] 96 % (07/01 0942)   I/O last 3 completed shifts: In: 3207.4 [P.O.:550; I.V.:1100; Blood:1357.4; IV Piggyback:200] Out: 1448 [Urine:950; Drains:345; Blood:150] Total I/O In: -  Out: 30 [Drains:30]   Physical Exam:  General: alert and no distress  Lungs: clear to auscultation bilaterally Heart: regular rate and rhythm, S1, S2 normal, no murmur, click, rub or gallop Abdomen: normal findings: no masses palpable and soft, mildly tender in lower abdomen, and abnormal findings:  distended (mild, with tympanic sounds) Pelvis:Bleeding: appropriate,  Incision: healing well, no significant drainage, no dehiscence, no significant erythema.  JP drain with  Extremities: DVT Evaluation: No evidence of DVT seen on physical exam. Negative Homan's sign. No cords or calf tenderness. No significant calf/ankle edema.   CBC Latest Ref Rng & Units 02/19/2021 02/18/2021 02/16/2021  WBC 4.0 - 10.5 K/uL 17.2(H) - 5.1  Hemoglobin 12.0 - 15.0 g/dL 7.8(L) 9.2(L) 5.9(L)  Hematocrit 36.0 - 46.0 % 22.6(L) 25.0(L) 21.2(L)  Platelets 150 - 400 K/uL 649(H) - 1,096(HH)       Lab Results  Component Value Date   CREATININE 0.67 02/16/2021     Assessment/Plan: POD#1 s/p robotic myomectomy, HD#2 s/p admission for pre-op blood transfusion  Encourage ambulation, will administer simethicone scheduled. Also to give milk of magnesia.  Continue to monitor output.  Fluid appears dark red,  watery Will repeat CBC for later this afternoon to ensure stability Fever likely secondary to atelectasis as patient with prolonged case. Occurred less than 24 hrs. No other obvious source currently. Encouraged aggressive use of incentive spirometer.  Patient was started on [place] oxygen overnight due to noted desaturation on vitals (O2 70%). Patient overall asymptomatic, denies SOB or chest pain.  Was on 3L, now weaning down to 2. Will continue to monitor but concern arises for possible blood clot.  Possible d/c home later today if saturation returns to normal.     Rubie Maid, MD [facility]

## 2021-02-19 NOTE — Progress Notes (Signed)
Several attempts to wean patient off oxygen attempted, saturations at 94% on 1L O2 [place], drops to 81% on room air.  D-dimer and CT/PE ordered, positive for bilateral PE.  Will start on Lovenox and consult Hospitalist for further management. Patient currently otherwise asymptomatic.   Hgb remains stable at this time. Patient is anemic post-operatively, however has a h/o moderate to severe chronic anemia.  Otherwise asymptomatic outside of mild fatigue.      Rubie Maid, MD [facility]

## 2021-02-19 NOTE — Progress Notes (Signed)
Pt oral temp was elevated at 100.5 for midnight set of vitals.  When RN entered room after [facility] notified  pt nurse K.Juandavid Dallman,RN of elevated temp, RN noted  pt room was exteremly hot and pt had multiple blankets  on top of her. Pt was assisted up out of bed to bathroom with husband and RN, pt stable on feet during ambulating , just taking more time to walk due to pain. Pt heat in room was turned down,  bed lined changed and  oral temp retaken with result of 100.3. pt give 2 mg IVP morphine for pain. Pt tylenol med too soon to given at this time. No distress noted.RN will continue to monitor.

## 2021-02-19 NOTE — Progress Notes (Signed)
ANTICOAGULATION CONSULT NOTE - Initial Consult  Pharmacy Consult for Heparin Drip Indication: pulmonary embolus  Allergies  Allergen Reactions   Aspirin Nausea And Vomiting   Oxycodone-Acetaminophen Rash and Shortness Of Breath   Lexapro [Escitalopram] Palpitations   Tape Other (See Comments)    Burning feeling. Has left mark on skin.   Belviq [Lorcaserin Hcl] Other (See Comments)    headache   Dicyclomine Nausea And Vomiting   Contrave [Naltrexone-Bupropion Hcl Er] Anxiety    Altered mental state   Norethindrone Rash    Patient Measurements: Height: 5\' 5"  (165.1 cm) Weight: 89.4 kg (197 lb) IBW/kg (Calculated) : 57 Heparin Dosing Weight: 76.7 kg  Vital Signs: Temp: 99.1 F (37.3 C) (07/01 1955) Temp Source: Oral (07/01 1955) BP: 123/79 (07/01 1955) Pulse Rate: 93 (07/01 1955)  Labs: Recent Labs    02/18/21 0201 02/19/21 0706 02/19/21 1155 02/19/21 1914  HGB 9.2* 7.8* 7.9*  --   HCT 25.0* 22.6* 22.9*  --   PLT  --  649* 358  --   APTT  --   --   --  32  LABPROT  --   --   --  16.3*  INR  --   --   --  1.3*  CREATININE  --   --  0.75  --     Estimated Creatinine Clearance: 100.2 mL/min (by C-G formula based on SCr of 0.75 mg/dL).   Medical History: Past Medical History:  Diagnosis Date   Anemia    Dizziness    Fibroid uterus    GERD (gastroesophageal reflux disease)    History of thrombocytosis    History of uterine fibroid 03/14/2014   myomectomy   IBS (irritable bowel syndrome)    Migraine    Polyp of stomach 01/06/2016   Stomach polyp, pyloric, consistent with prolapse.     Assessment: Patient is a 45yo female admitted for anemia due to menstrual blood loss, she has received transfusion and Hgb is currently at 7.9. Patient noted to be desaturating without being symptomatic. Found to have PE. Pharmacy consulted for Heparin dosing.  Goal of Therapy:  Heparin level 0.3-0.7 units/ml Monitor platelets by anticoagulation protocol: Yes   Plan:   Give 5000 units bolus x 1 Start heparin infusion at 1300 units/hr Check anti-Xa level in 6 hours and daily while on heparin Continue to monitor H&H and platelets  Paulina Fusi, PharmD, BCPS 02/19/2021 10:43 PM

## 2021-02-20 ENCOUNTER — Inpatient Hospital Stay
Admit: 2021-02-20 | Discharge: 2021-02-20 | Disposition: A | Discharge status: Home / Self Care | Payer: 59 | Attending: Internal Medicine | Admitting: Internal Medicine

## 2021-02-20 DIAGNOSIS — I2699 Other pulmonary embolism without acute cor pulmonale: Secondary | ICD-10-CM

## 2021-02-20 LAB — HEPARIN LEVEL (UNFRACTIONATED)
Heparin Unfractionated: 0.58 IU/mL (ref 0.30–0.70)
Heparin Unfractionated: 0.47 IU/mL (ref 0.30–0.70)

## 2021-02-20 LAB — COMPREHENSIVE METABOLIC PANEL
ALT: 9 U/L (ref 0–44)
AST: 12 U/L — ABNORMAL LOW (ref 15–41)
Albumin: 2.8 g/dL — ABNORMAL LOW (ref 3.5–5.0)
Alkaline Phosphatase: 44 U/L (ref 38–126)
Anion gap: 4 — ABNORMAL LOW (ref 5–15)
BUN: 9 mg/dL (ref 6–20)
CO2: 28 mmol/L (ref 22–32)
Calcium: 8.2 mg/dL — ABNORMAL LOW (ref 8.9–10.3)
Chloride: 105 mmol/L (ref 98–111)
Creatinine, Ser: 0.53 mg/dL (ref 0.44–1.00)
GFR, Estimated: >60 mL/min (ref >60)
Glucose, Bld: 91 mg/dL (ref 70–99)
Potassium: 3.6 mmol/L (ref 3.5–5.1)
Sodium: 137 mmol/L (ref 135–145)
Total Bilirubin: 0.6 mg/dL (ref 0.3–1.2)
Total Protein: 6 g/dL — ABNORMAL LOW (ref 6.5–8.1)

## 2021-02-20 LAB — HEMOGLOBIN AND HEMATOCRIT, BLOOD
HCT: 22.4 % — ABNORMAL LOW (ref 36.0–46.0)
Hemoglobin: 7.7 g/dL — ABNORMAL LOW (ref 12.0–15.0)

## 2021-02-20 LAB — CBC
HCT: 21.1 % — ABNORMAL LOW (ref 36.0–46.0)
Hemoglobin: 7 g/dL — ABNORMAL LOW (ref 12.0–15.0)
MCH: 22.4 pg — ABNORMAL LOW (ref 26.0–34.0)
MCHC: 33.2 g/dL (ref 30.0–36.0)
MCV: 67.6 fL — ABNORMAL LOW (ref 80.0–100.0)
Platelets: 355 10*3/uL (ref 150–400)
RBC: 3.12 MIL/uL — ABNORMAL LOW (ref 3.87–5.11)
RDW: 29.7 % — ABNORMAL HIGH (ref 11.5–15.5)
WBC: 15.5 10*3/uL — ABNORMAL HIGH (ref 4.0–10.5)
nRBC: 0.1 % (ref 0.0–0.2)

## 2021-02-20 LAB — PREPARE RBC (CROSSMATCH)

## 2021-02-20 MED ORDER — SODIUM CHLORIDE 0.9% IV SOLUTION: Freq: Once | INTRAVENOUS | Status: AC

## 2021-02-20 NOTE — Progress Notes (Signed)
*  PRELIMINARY RESULTS* Echocardiogram 2D Echocardiogram has been performed.  Claretta Fraise 02/20/2021, 12:26 PM

## 2021-02-20 NOTE — Progress Notes (Signed)
ANTICOAGULATION CONSULT NOTE - Initial Consult  Pharmacy Consult for Heparin Drip Indication: pulmonary embolus  Allergies  Allergen Reactions   Aspirin Nausea And Vomiting   Oxycodone-Acetaminophen Rash and Shortness Of Breath   Lexapro [Escitalopram] Palpitations   Tape Other (See Comments)    Burning feeling. Has left mark on skin.   Belviq [Lorcaserin Hcl] Other (See Comments)    headache   Dicyclomine Nausea And Vomiting   Contrave [Naltrexone-Bupropion Hcl Er] Anxiety    Altered mental state   Norethindrone Rash    Patient Measurements: Height: 5\' 5"  (165.1 cm) Weight: 89.7 kg (197 lb 12.8 oz) IBW/kg (Calculated) : 57 Heparin Dosing Weight: 76.7 kg  Vital Signs: Temp: 98.6 F (37 C) (07/02 0413) Temp Source: Oral (07/02 0413) BP: 120/90 (07/02 0413) Pulse Rate: 93 (07/02 0413)  Labs: Recent Labs    02/18/21 0201 02/19/21 0706 02/19/21 1155 02/19/21 1914 02/20/21 0531  HGB 9.2* 7.8* 7.9*  --   --   HCT 25.0* 22.6* 22.9*  --   --   PLT  --  649* 358  --   --   APTT  --   --   --  32  --   LABPROT  --   --   --  16.3*  --   INR  --   --   --  1.3*  --   HEPARINUNFRC  --   --   --   --  0.58  CREATININE  --   --  0.75  --   --      Estimated Creatinine Clearance: 100.3 mL/min (by C-G formula based on SCr of 0.75 mg/dL).   Medical History: Past Medical History:  Diagnosis Date   Anemia    Dizziness    Fibroid uterus    GERD (gastroesophageal reflux disease)    History of thrombocytosis    History of uterine fibroid 03/14/2014   myomectomy   IBS (irritable bowel syndrome)    Migraine    Polyp of stomach 01/06/2016   Stomach polyp, pyloric, consistent with prolapse.     Assessment: Patient is a 45yo female admitted for anemia due to menstrual blood loss, she has received transfusion and Hgb is currently at 7.9. Patient noted to be desaturating without being symptomatic. Found to have PE. Pharmacy consulted for Heparin dosing.  Goal of Therapy:   Heparin level 0.3-0.7 units/ml Monitor platelets by anticoagulation protocol: Yes  7/02 0531 HL 0.58, therapeutic   Plan:  Continue heparin infusion at 1300 units/hr Recheck HL in 6 hours to confirm, then daily CBC daily while on heparin.  Renda Rolls, PharmD, [facility] 02/20/2021 6:48 AM

## 2021-02-20 NOTE — Progress Notes (Signed)
*  PRELIMINARY RESULTS* Echocardiogram 2D Echocardiogram has been performed.  Sharon Herring 02/20/2021, 12:25 PM

## 2021-02-20 NOTE — Progress Notes (Signed)
Patient ID: Sharon Herring, female   DOB: December 04, 1975, 45 y.o.   MRN: [id number]      POST-OP NOTE - DAY # 2  Subjective:   The patient does not have complaints.  She reports that her pain is well controlled.  She is otherwise asymptomatic.    objective:  BP 114/78   Pulse 88   Temp 98.4 F (36.9 C) (Oral)   Resp 18   Ht 5\' 5"  (1.651 m)   Wt 89.7 kg   LMP 02/10/2021 (Exact Date)   SpO2 100%   BMI 32.92 kg/m     Abdomen:                          Abdomen soft and nontender without distention or masses      clean, dry dressing  Abdominal drain remains intact -approximately 70 cc drain through the night.     Doppler of lower extremity showed no evidence of DVT.   Assessment:   Patient being anticoagulated with heparin.  Hemoglobin remained stable at 7.  Abdominal drainage remained stable at approximately 70 per shift.  She remains on 1 L nasal cannula oxygen.  Pain is well controlled.  Patient has begun to experience some abdominal gas pains and has not yet passing flatus.      Plan:        Patient's care was transferred to hospitalist service for PE. (Dr. Florene Glen) I discussed her care with him today.  Plan 1 unit of packed red cells to improve her overall hemoglobin so that if additional bleeding occurs there is a cushion.  Continue to follow hemoglobin and abdominal drainage for evidence of internal bleeding status post surgery.  Patient with excellent recovery so far surgically.  H&H stable and wound VAC remains fairly constant with low output volume.  We will continue to maintain drain as a secondary method to evaluate possibility of internal bleeding in light of anticoagulation.  Plan to wean from nasal cannula oxygen if patient can maintain sats.  Consider Mylicon if patient's not passing flatus and remains uncomfortable later today.   Finis Bud, M.D. 02/20/2021 11:23 AM

## 2021-02-20 NOTE — Progress Notes (Signed)
Consult NOTE    Sharon Herring  OFB:[id number] DOB: October 17, 1975 DOA: 02/17/2021 PCP: Steele Sizer, MD   No chief complaint on file.   Brief Narrative:  Sharon Herring is Sharon Herring 45 y.o. female with medical history significant for uterine fibroid, history of concussion without loss of consciousness, obesity, iron deficiency anemia, menorrhagia, s/p myomectomy on 02/18/21.  TRH consulted for pulmonary embolism and hypoxia.   Assessment & Plan:   Active Problems:   Anemia associated with acute blood loss   S/P myomectomy  Acute Pulmonary Embolism  Acute Hypoxic Respiratory Failure  CT with acute R>L bilateral pulm emboli, trace R sided effusion Likely provoked with surgery Heparin gtt LE [name] negative for DVT Echo pending  Continue heparin gtt Wean oxygen as tolerated (currently on 3 L) Will need 3-6 months uninterrupted anticoagulation.  After this, would recommend discussing risk factors with PCP and consider d/c'ing anticoagulation.  Reasonable to d/c anticoagulation with known provoking risk factor, no hx prior VTE.  Acute on Chronic Blood Loss Anemia  Iron Def Anemia 2/2 surgery, post op blood loss on anticoagulation Will transfuse 1 unit pRBC's  Uterine Fibroids S/p xi robotic assisted myomectomy chromopertubation Per gynecology   DVT prophylaxis: heparin gtt Code Status: full  Family Communication: husband at bedside Disposition:  pending improvement in resp status     Consultants:  Gyn is primary TRH is consulting   Procedures:  Robotic myomectomy, chromopertubation   Antimicrobials:  Anti-infectives (From admission, onward)    Start     Dose/Rate Route Frequency Ordered Stop   02/18/21 0659  ceFAZolin (ANCEF) 2-4 GM/100ML-% IVPB       Note to Pharmacy: [facility]   : cabinet override      02/18/21 0659 02/18/21 0810   02/18/21 0600  ceFAZolin (ANCEF) IVPB 2g/100 mL premix        2 g 200 mL/hr over 30 Minutes Intravenous On call to O.R. 02/18/21 0445  02/18/21 0809          Subjective: No LEE, denies CP or SOB   Objective: Vitals:   02/19/21 1955 02/19/21 2319 02/20/21 0413 02/20/21 0752  BP: 123/79 133/89 120/90 124/88  Pulse: 93 93 93 85  Resp: (!) 24 20 20 17   Temp: 99.1 F (37.3 C) 99.2 F (37.3 C) 98.6 F (37 C) 98.3 F (36.8 C)  TempSrc: Oral Oral Oral Oral  SpO2: 99% 100% 100% 100%  Weight:  89.7 kg    Height:        Intake/Output Summary (Last 24 hours) at 02/20/2021 0905 Last data filed at 02/20/2021 0749 Gross per 24 hour  Intake 120 ml  Output 615 ml  Net -495 ml   Filed Weights   02/17/21 1636 02/19/21 2319  Weight: 89.4 kg 89.7 kg    Examination:  General exam: Appears calm and comfortable  Respiratory system: Clear to auscultation. Respiratory effort normal. Cardiovascular system: S1 & S2 heard, RRR.  Gastrointestinal system: Abdomen is nondistended, soft and nontender Central nervous system: Alert and oriented. No focal neurological deficits. Extremities: no LEE Skin: No rashes, lesions or ulcers Psychiatry: Judgement and insight appear normal. Mood & affect appropriate.     Data Reviewed: I have personally reviewed following labs and imaging studies  CBC: Recent Labs  Lab 02/16/21 0833 02/18/21 0201 02/19/21 0706 02/19/21 1155 02/20/21 0531  WBC 5.1  --  17.2* 17.1* 15.5*  NEUTROABS  --   --  14.9*  --   --   HGB  5.9* 9.2* 7.8* 7.9* 7.0*  HCT 21.2* 25.0* 22.6* 22.9* 21.1*  MCV 60.4*  --  67.1* 68.4* 67.6*  PLT 1,096*  --  649* 358 782    Basic Metabolic Panel: Recent Labs  Lab 02/16/21 0833 02/19/21 1155  NA 139 135  K 3.7 3.9  CL 108 103  CO2 27 25  GLUCOSE 98 115*  BUN 10 9  CREATININE 0.67 0.75  CALCIUM 8.8* 8.2*    GFR: Estimated Creatinine Clearance: 100.3 mL/min (by C-G formula based on SCr of 0.75 mg/dL).  Liver Function Tests: Recent Labs  Lab 02/16/21 0833 02/19/21 1155  AST 16 19  ALT 9 9  ALKPHOS 62 47  BILITOT 0.3 0.6  PROT 7.4 6.3*  ALBUMIN  3.6 3.0*    CBG: No results for input(s): GLUCAP in the last 168 hours.   Recent Results (from the past 240 hour(s))  SARS CORONAVIRUS 2 (TAT 6-24 HRS) Nasopharyngeal Nasopharyngeal Swab     Status: None   Collection Time: 02/16/21  8:25 AM   Specimen: Nasopharyngeal Swab  Result Value Ref Range Status   SARS Coronavirus 2 NEGATIVE NEGATIVE Final    Comment: (NOTE) SARS-CoV-2 target nucleic acids are NOT DETECTED.  The SARS-CoV-2 RNA is generally detectable in upper and lower respiratory specimens during the acute phase of infection. Negative results do not preclude SARS-CoV-2 infection, do not rule out co-infections with other pathogens, and should not be used as the sole basis for treatment or other patient management decisions. Negative results must be combined with clinical observations, patient history, and epidemiological information. The expected result is Negative.  Fact Sheet for Patients: SugarRoll.be  Fact Sheet for Healthcare Providers: https://www.woods-mathews.com/  This test is not yet approved or cleared by the [place] FDA and  has been authorized for detection and/or diagnosis of SARS-CoV-2 by FDA under an Emergency Use Authorization (EUA). This EUA will remain  in effect (meaning this test can be used) for the duration of the COVID-19 declaration under Se ction 564(b)(1) of the Act, 21 U.S.C. section 360bbb-3(b)(1), unless the authorization is terminated or revoked sooner.  Performed at [facility], [place] [address], [place], [place] [address]          Radiology Studies: CT Angio Chest Pulmonary Embolism (PE) W or WO Contrast  Result Date: 02/19/2021 CLINICAL DATA:  Low oxygen saturation recent surgery EXAM: CT ANGIOGRAPHY CHEST WITH CONTRAST TECHNIQUE: Multidetector CT imaging of the chest was performed using the standard protocol during bolus administration of intravenous contrast. Multiplanar CT  image reconstructions and MIPs were obtained to evaluate the vascular anatomy. CONTRAST:  44mL OMNIPAQUE IOHEXOL 350 MG/ML SOLN COMPARISON:  Chest x-ray 01/11/2020, CT chest 01/11/2020 FINDINGS: Cardiovascular: Satisfactory opacification of the pulmonary arteries to the segmental level. Multiple acute filling defects within segmental and subsegmental right upper, right middle and right lower lobe pulmonary arteries. Small left lower lobe subsegmental emboli are also present. Nonaneurysmal aorta. No dissection seen. Borderline cardiomegaly. No pericardial effusion Mediastinum/Nodes: Midline trachea. No thyroid mass. No suspicious adenopathy. Esophagus within normal limits. Lungs/Pleura: Dependent atelectasis. Trace right-sided pleural effusion. No pneumothorax Upper Abdomen: No acute abnormality. Musculoskeletal: No chest wall abnormality. No acute or significant osseous findings. Review of the MIP images confirms the above findings. IMPRESSION: 1. Positive for acute right greater than left bilateral pulmonary emboli involving segmental and subsegmental vessels. 2. Trace right-sided pleural effusion with dependent atelectasis. Borderline cardiomegaly Critical Value/emergent results were called by telephone at the time of interpretation on  02/19/2021 at 6:56 pm to provider [facility] , who verbally acknowledged these results. Electronically Signed   By: Donavan Foil M.D.   On: 02/19/2021 18:56   US Venous Img Lower Bilateral (DVT)  Result Date: 02/20/2021 CLINICAL DATA:  Pulmonary embolism. Evaluate for residual lower extremity DVT. EXAM: BILATERAL LOWER EXTREMITY VENOUS DOPPLER ULTRASOUND TECHNIQUE: Gray-scale sonography with graded compression, as well as color Doppler and duplex ultrasound were performed to evaluate the lower extremity deep venous systems from the level of the common femoral vein and including the common femoral, femoral, profunda femoral, popliteal and calf veins including the posterior  tibial, peroneal and gastrocnemius veins when visible. The superficial great saphenous vein was also interrogated. Spectral Doppler was utilized to evaluate flow at rest and with distal augmentation maneuvers in the common femoral, femoral and popliteal veins. COMPARISON:  None. FINDINGS: RIGHT LOWER EXTREMITY Common Femoral Vein: No evidence of thrombus. Normal compressibility, respiratory phasicity and response to augmentation. Saphenofemoral Junction: No evidence of thrombus. Normal compressibility and flow on color Doppler imaging. Profunda Femoral Vein: No evidence of thrombus. Normal compressibility and flow on color Doppler imaging. Femoral Vein: No evidence of thrombus. Normal compressibility, respiratory phasicity and response to augmentation. Popliteal Vein: No evidence of thrombus. Normal compressibility, respiratory phasicity and response to augmentation. Calf Veins: No evidence of thrombus. Normal compressibility and flow on color Doppler imaging. Superficial Great Saphenous Vein: No evidence of thrombus. Normal compressibility. Venous Reflux:  None. Other Findings:  None. LEFT LOWER EXTREMITY Common Femoral Vein: No evidence of thrombus. Normal compressibility, respiratory phasicity and response to augmentation. Saphenofemoral Junction: No evidence of thrombus. Normal compressibility and flow on color Doppler imaging. Profunda Femoral Vein: No evidence of thrombus. Normal compressibility and flow on color Doppler imaging. Femoral Vein: No evidence of thrombus. Normal compressibility, respiratory phasicity and response to augmentation. Popliteal Vein: No evidence of thrombus. Normal compressibility, respiratory phasicity and response to augmentation. Calf Veins: No evidence of thrombus. Normal compressibility and flow on color Doppler imaging. Superficial Great Saphenous Vein: No evidence of thrombus. Normal compressibility. Venous Reflux:  None. Other Findings:  None. IMPRESSION: No evidence of deep  venous thrombosis in either lower extremity. Electronically Signed   By: Jacqulynn Cadet M.D.   On: 02/20/2021 08:10        Scheduled Meds:  sodium chloride   Intravenous Once   acetaminophen  1,000 mg Oral Q6H   docusate sodium  100 mg Oral BID   pantoprazole  40 mg Oral Daily   Continuous Infusions:  heparin 1,300 Units/hr (02/19/21 2332)   lactated ringers Stopped (02/19/21 1139)   lactated ringers Stopped (02/19/21 1138)     LOS: 1 day    Time spent: over 30 min    Fayrene Helper, MD [facility]   To contact the attending provider between 7A-7P or the covering provider during after hours 7P-7A, please log into the web site [email or web address] and access using universal [place] password for that web site. If you do not have the password, please call the hospital operator.  02/20/2021, 9:05 AM

## 2021-02-20 NOTE — Progress Notes (Signed)
ANTICOAGULATION CONSULT NOTE - Initial Consult  Pharmacy Consult for Heparin Drip Indication: pulmonary embolus  Allergies  Allergen Reactions   Aspirin Nausea And Vomiting   Oxycodone-Acetaminophen Rash and Shortness Of Breath   Lexapro [Escitalopram] Palpitations   Tape Other (See Comments)    Burning feeling. Has left mark on skin.   Belviq [Lorcaserin Hcl] Other (See Comments)    headache   Dicyclomine Nausea And Vomiting   Contrave [Naltrexone-Bupropion Hcl Er] Anxiety    Altered mental state   Norethindrone Rash    Patient Measurements: Height: 5\' 5"  (165.1 cm) Weight: 89.7 kg (197 lb 12.8 oz) IBW/kg (Calculated) : 57 Heparin Dosing Weight: 76.7 kg  Vital Signs: Temp: 99 F (37.2 C) (07/02 1354) Temp Source: Oral (07/02 1354) BP: 125/77 (07/02 1354) Pulse Rate: 84 (07/02 1354)  Labs: Recent Labs    02/19/21 0706 02/19/21 1155 02/19/21 1914 02/20/21 0531 02/20/21 1558  HGB 7.8* 7.9*  --  7.0* 7.7*  HCT 22.6* 22.9*  --  21.1* 17.7*  PLT 649* 358  --  355  --   APTT  --   --  32  --   --   LABPROT  --   --  16.3*  --   --   INR  --   --  1.3*  --   --   HEPARINUNFRC  --   --   --  0.58 0.47  CREATININE  --  0.75  --   --  0.53     Estimated Creatinine Clearance: 100.3 mL/min (by C-G formula based on SCr of 0.53 mg/dL).   Medical History: Past Medical History:  Diagnosis Date   Anemia    Dizziness    Fibroid uterus    GERD (gastroesophageal reflux disease)    History of thrombocytosis    History of uterine fibroid 03/14/2014   myomectomy   IBS (irritable bowel syndrome)    Migraine    Polyp of stomach 01/06/2016   Stomach polyp, pyloric, consistent with prolapse.     Assessment: Patient is a 45yo female admitted for anemia due to menstrual blood loss, she has received transfusion and Hgb is currently at 7.9. Patient noted to be desaturating without being symptomatic. Found to have PE. Pharmacy consulted for Heparin dosing.  Goal of Therapy:   Heparin level 0.3-0.7 units/ml Monitor platelets by anticoagulation protocol: Yes  7/02 0531 HL 0.58, therapeutic 7/02 1558 HL 0.47, therapeutic    Plan:  Heparin level now therapeutic x 2. Continue heparin infusion at 1300 units/hr Recheck HL and CBC daily per protocol while on heparin .   Pernell Dupre, PharmD, BCPS Clinical Pharmacist 02/20/2021 5:03 PM

## 2021-02-21 LAB — HEMOGLOBIN AND HEMATOCRIT, BLOOD
HCT: 22.5 % — ABNORMAL LOW (ref 36.0–46.0)
Hemoglobin: 7.6 g/dL — ABNORMAL LOW (ref 12.0–15.0)

## 2021-02-21 LAB — CBC WITH DIFFERENTIAL/PLATELET
Abs Immature Granulocytes: 0.13 10*3/uL — ABNORMAL HIGH (ref 0.00–0.07)
Basophils Absolute: 0 10*3/uL (ref 0.0–0.1)
Basophils Relative: 0 %
Eosinophils Absolute: 0.4 10*3/uL (ref 0.0–0.5)
Eosinophils Relative: 3 %
HCT: 22.9 % — ABNORMAL LOW (ref 36.0–46.0)
Hemoglobin: 7.6 g/dL — ABNORMAL LOW (ref 12.0–15.0)
Immature Granulocytes: 1 %
Lymphocytes Relative: 23 %
Lymphs Abs: 2.9 10*3/uL (ref 0.7–4.0)
MCH: 23 pg — ABNORMAL LOW (ref 26.0–34.0)
MCHC: 33.2 g/dL (ref 30.0–36.0)
MCV: 69.4 fL — ABNORMAL LOW (ref 80.0–100.0)
Monocytes Absolute: 0.7 10*3/uL (ref 0.1–1.0)
Monocytes Relative: 6 %
Neutro Abs: 8.4 10*3/uL — ABNORMAL HIGH (ref 1.7–7.7)
Neutrophils Relative %: 67 %
Platelets: 470 10*3/uL — ABNORMAL HIGH (ref 150–400)
RBC: 3.3 MIL/uL — ABNORMAL LOW (ref 3.87–5.11)
RDW: 29.8 % — ABNORMAL HIGH (ref 11.5–15.5)
Smear Review: NORMAL
WBC: 12.5 10*3/uL — ABNORMAL HIGH (ref 4.0–10.5)
nRBC: 0.2 % (ref 0.0–0.2)

## 2021-02-21 LAB — COMPREHENSIVE METABOLIC PANEL
ALT: 9 U/L (ref 0–44)
AST: 13 U/L — ABNORMAL LOW (ref 15–41)
Albumin: 2.7 g/dL — ABNORMAL LOW (ref 3.5–5.0)
Alkaline Phosphatase: 45 U/L (ref 38–126)
Anion gap: 6 (ref 5–15)
BUN: 10 mg/dL (ref 6–20)
CO2: 29 mmol/L (ref 22–32)
Calcium: 8.4 mg/dL — ABNORMAL LOW (ref 8.9–10.3)
Chloride: 104 mmol/L (ref 98–111)
Creatinine, Ser: 0.62 mg/dL (ref 0.44–1.00)
GFR, Estimated: >60 mL/min (ref >60)
Glucose, Bld: 91 mg/dL (ref 70–99)
Potassium: 3.7 mmol/L (ref 3.5–5.1)
Sodium: 139 mmol/L (ref 135–145)
Total Bilirubin: 0.5 mg/dL (ref 0.3–1.2)
Total Protein: 6 g/dL — ABNORMAL LOW (ref 6.5–8.1)

## 2021-02-21 LAB — ECHOCARDIOGRAM COMPLETE
AR max vel: 2.94 cm2
AV Peak grad: 7.6 mmHg
Ao pk vel: 1.38 m/s
Area-P 1/2: 5.54 cm2
Height: 65 in
S' Lateral: 2.98 cm
Weight: 3164.8 oz

## 2021-02-21 LAB — IRON AND TIBC
Iron: 26 ug/dL — ABNORMAL LOW (ref 28–170)
Saturation Ratios: 8 % — ABNORMAL LOW (ref 10.4–31.8)
TIBC: 319 ug/dL (ref 250–450)
UIBC: 293 ug/dL

## 2021-02-21 LAB — FOLATE: Folate: 7.4 ng/mL (ref >5.9)

## 2021-02-21 LAB — PHOSPHORUS: Phosphorus: 3.2 mg/dL (ref 2.5–4.6)

## 2021-02-21 LAB — VITAMIN B12: Vitamin B-12: 178 pg/mL — ABNORMAL LOW (ref 180–914)

## 2021-02-21 LAB — HEPARIN LEVEL (UNFRACTIONATED): Heparin Unfractionated: 0.41 IU/mL (ref 0.30–0.70)

## 2021-02-21 LAB — MAGNESIUM: Magnesium: 1.8 mg/dL (ref 1.7–2.4)

## 2021-02-21 LAB — FERRITIN: Ferritin: 31 ng/mL (ref 11–307)

## 2021-02-21 MED ORDER — FERROUS SULFATE 325 (65 FE) MG PO TABS
325.0000 mg | ORAL_TABLET | Freq: Every day | ORAL | Status: DC
  Administered 2021-02-22: 325 mg via ORAL
  Filled 2021-02-21: qty 1

## 2021-02-21 MED ORDER — ACETAMINOPHEN 325 MG PO TABS
650.0000 mg | ORAL_TABLET | Freq: Four times a day (QID) | ORAL | Status: DC | PRN
  Administered 2021-02-21: 650 mg via ORAL
  Filled 2021-02-21: qty 2

## 2021-02-21 MED ORDER — POLYETHYLENE GLYCOL 3350 17 G PO PACK
17.0000 g | PACK | Freq: Every day | ORAL | Status: DC
  Administered 2021-02-21 – 2021-02-22 (×2): 17 g via ORAL
  Filled 2021-02-21 (×2): qty 1

## 2021-02-21 NOTE — Progress Notes (Signed)
ANTICOAGULATION CONSULT NOTE - Initial Consult  Pharmacy Consult for Heparin Drip Indication: pulmonary embolus  Allergies  Allergen Reactions   Aspirin Nausea And Vomiting   Oxycodone-Acetaminophen Rash and Shortness Of Breath   Lexapro [Escitalopram] Palpitations   Tape Other (See Comments)    Burning feeling. Has left mark on skin.   Belviq [Lorcaserin Hcl] Other (See Comments)    headache   Dicyclomine Nausea And Vomiting   Contrave [Naltrexone-Bupropion Hcl Er] Anxiety    Altered mental state   Norethindrone Rash    Patient Measurements: Height: 5\' 5"  (165.1 cm) Weight: 89.7 kg (197 lb 12.8 oz) IBW/kg (Calculated) : 57 Heparin Dosing Weight: 76.7 kg  Vital Signs: Temp: 98 F (36.7 C) (07/03 0515) Temp Source: Oral (07/03 0100) BP: 123/83 (07/03 0515) Pulse Rate: 74 (07/03 0515)  Labs: Recent Labs    02/19/21 0706 02/19/21 1155 02/19/21 1914 02/20/21 0531 02/20/21 1558 02/21/21 0536  HGB 7.8* 7.9*  --  7.0* 7.7*  --   HCT 22.6* 22.9*  --  21.1* 22.4*  --   PLT 649* 358  --  355  --   --   APTT  --   --  32  --   --   --   LABPROT  --   --  16.3*  --   --   --   INR  --   --  1.3*  --   --   --   HEPARINUNFRC  --   --   --  0.58 0.47 0.41  CREATININE  --  0.75  --   --  0.53 0.62     Estimated Creatinine Clearance: 100.3 mL/min (by C-G formula based on SCr of 0.62 mg/dL).   Medical History: Past Medical History:  Diagnosis Date   Anemia    Dizziness    Fibroid uterus    GERD (gastroesophageal reflux disease)    History of thrombocytosis    History of uterine fibroid 03/14/2014   myomectomy   IBS (irritable bowel syndrome)    Migraine    Polyp of stomach 01/06/2016   Stomach polyp, pyloric, consistent with prolapse.     Assessment: Patient is a 44yo female admitted for anemia due to menstrual blood loss, she has received transfusion and Hgb is currently at 7.9. Patient noted to be desaturating without being symptomatic. Found to have PE.  Pharmacy consulted for Heparin dosing.  Goal of Therapy:  Heparin level 0.3-0.7 units/ml Monitor platelets by anticoagulation protocol: Yes  7/02 0531 HL 0.58, therapeutic 7/02 1558 HL 0.47, therapeutic  7/03 0536 HL 0.41   Plan: . Continue heparin infusion at 1300 units/hr Recheck HL and CBC daily per protocol while on heparin .   Renda Rolls, PharmD, [facility] 02/21/2021 6:49 AM

## 2021-02-21 NOTE — Progress Notes (Signed)
Consult NOTE    Sharon Herring  ELF:[id number] DOB: September 02, 1975 DOA: 02/17/2021 PCP: Steele Sizer, MD   No chief complaint on file.   Brief Narrative:  [name] is Sharon Herring 45 y.o. female with medical history significant for uterine fibroid, history of concussion without loss of consciousness, obesity, iron deficiency anemia, menorrhagia, s/p myomectomy on 02/18/21.  TRH consulted for pulmonary embolism and hypoxia.   Assessment & Plan:   Active Problems:   Anemia associated with acute blood loss   S/P myomectomy   Acute pulmonary embolism (HCC)  Acute Pulmonary Embolism  Acute Hypoxic Respiratory Failure  CT with acute R>L bilateral pulm emboli, trace R sided effusion Likely provoked with surgery Heparin gtt LE [name] negative for DVT Echo 60-65%, no RWMA, RVSF not well visualized  Continue heparin gtt -> transition to doac when able (xarelto vs eliquis) (will try to review with pharmacy cost) Wean oxygen as tolerated (on RA) Will need 3-6 months uninterrupted anticoagulation.  After this, would recommend discussing risk factors with PCP and consider d/c'ing anticoagulation.  Reasonable to d/c anticoagulation with known provoking risk factor, no hx prior VTE.  Acute on Chronic Blood Loss Anemia  Iron Def Anemia 2/2 surgery, post op blood loss on anticoagulation S/p 4 unit pRBC since 6/29 215 bloody fluid out of drain in past 24 hrs - will recommend continued monitoring  Will need close follow up with gyn and close monitoring in anticipation of menstrual cycle  Uterine Fibroids S/p xi robotic assisted myomectomy chromopertubation Per gynecology   DVT prophylaxis: heparin gtt Code Status: full  Family Communication: husband at bedside Disposition:  pending improvement in resp status     Consultants:  Gyn is primary Sheldon is consulting   Procedures:  Robotic myomectomy, chromopertubation   Echo IMPRESSIONS     1. Left ventricular ejection fraction, by estimation, is  60 to 65%. The  left ventricle has normal function. The left ventricle has no regional  wall motion abnormalities. There is mild left ventricular hypertrophy.  Left ventricular diastolic parameters  were normal.   2. Right ventricular systolic function was not well visualized. The right  ventricular size is normal.   3. The mitral valve is grossly normal. Trivial mitral valve  regurgitation.   4. The aortic valve is grossly normal. Aortic valve regurgitation is not  visualized.  Antimicrobials:  Anti-infectives (From admission, onward)    Start     Dose/Rate Route Frequency Ordered Stop   02/18/21 0659  ceFAZolin (ANCEF) 2-4 GM/100ML-% IVPB       Note to Pharmacy: [facility] [place]   : cabinet override      02/18/21 0659 02/18/21 0810   02/18/21 0600  ceFAZolin (ANCEF) IVPB 2g/100 mL premix        2 g 200 mL/hr over 30 Minutes Intravenous On call to O.R. 02/18/21 0445 02/18/21 0809          Subjective: No LEE, denies CP or SOB  Asking about discharge  Objective: Vitals:   02/21/21 0515 02/21/21 0821 02/21/21 1203 02/21/21 1544  BP: 123/83 136/81 129/77 131/79  Pulse: 74 83 80 87  Resp: 16 17 17 17   Temp: 98 F (36.7 C) 98.9 F (37.2 C) 99.6 F (37.6 C) 100.3 F (37.9 C)  TempSrc:      SpO2: 100% 100% 99% 100%  Weight:      Height:        Intake/Output Summary (Last 24 hours) at 02/21/2021 1634 Last data filed at  02/21/2021 1330 Gross per 24 hour  Intake 1437 ml  Output 340 ml  Net 1097 ml   Filed Weights   02/17/21 1636 02/19/21 2319  Weight: 89.4 kg 89.7 kg    Examination:  General: No acute distress. Cardiovascular: Heart sounds show Grisell Bissette regular rate, and rhythm.  Lungs: unlabored Neurological: Alert and oriented 3. Moves all extremities 4 . Cranial nerves II through XII grossly intact. Skin: No rashes or lesions. Extremities: No clubbing or cyanosis. No edema.     Data Reviewed: I have personally reviewed following labs and imaging  studies  CBC: Recent Labs  Lab 02/16/21 0833 02/18/21 0201 02/19/21 0706 02/19/21 1155 02/20/21 0531 02/20/21 1558 02/21/21 0536  WBC 5.1  --  17.2* 17.1* 15.5*  --  12.5*  NEUTROABS  --   --  14.9*  --   --   --  8.4*  HGB 5.9*   < > 7.8* 7.9* 7.0* 7.7* 7.6*  HCT 21.2*   < > 22.6* 22.9* 21.1* 22.4* 22.9*  MCV 60.4*  --  67.1* 68.4* 67.6*  --  69.4*  PLT 1,096*  --  649* 358 355  --  470*   < > = values in this interval not displayed.    Basic Metabolic Panel: Recent Labs  Lab 02/16/21 0833 02/19/21 1155 02/20/21 1558 02/21/21 0536  NA 139 135 137 139  K 3.7 3.9 3.6 3.7  CL 108 103 105 104  CO2 27 25 28 29   GLUCOSE 98 115* 91 91  BUN 10 9 9 10   CREATININE 0.67 0.75 0.53 0.62  CALCIUM 8.8* 8.2* 8.2* 8.4*  MG  --   --   --  1.8  PHOS  --   --   --  3.2    GFR: Estimated Creatinine Clearance: 100.3 mL/min (by C-G formula based on SCr of 0.62 mg/dL).  Liver Function Tests: Recent Labs  Lab 02/16/21 0833 02/19/21 1155 02/20/21 1558 02/21/21 0536  AST 16 19 12* 13*  ALT 9 9 9 9   ALKPHOS 62 47 44 45  BILITOT 0.3 0.6 0.6 0.5  PROT 7.4 6.3* 6.0* 6.0*  ALBUMIN 3.6 3.0* 2.8* 2.7*    CBG: No results for input(s): GLUCAP in the last 168 hours.   Recent Results (from the past 240 hour(s))  SARS CORONAVIRUS 2 (TAT 6-24 HRS) Nasopharyngeal Nasopharyngeal Swab     Status: None   Collection Time: 02/16/21  8:25 AM   Specimen: Nasopharyngeal Swab  Result Value Ref Range Status   SARS Coronavirus 2 NEGATIVE NEGATIVE Final    Comment: (NOTE) SARS-CoV-2 target nucleic acids are NOT DETECTED.  The SARS-CoV-2 RNA is generally detectable in upper and lower respiratory specimens during the acute phase of infection. Negative results do not preclude SARS-CoV-2 infection, do not rule out co-infections with other pathogens, and should not be used as the sole basis for treatment or other patient management decisions. Negative results must be combined with clinical  observations, patient history, and epidemiological information. The expected result is Negative.  Fact Sheet for Patients: SugarRoll.be  Fact Sheet for Healthcare Providers: https://www.woods-mathews.com/  This test is not yet approved or cleared by the [place] FDA and  has been authorized for detection and/or diagnosis of SARS-CoV-2 by FDA under an Emergency Use Authorization (EUA). This EUA will remain  in effect (meaning this test can be used) for the duration of the COVID-19 declaration under Se ction 564(b)(1) of the Act, 21 U.S.C. section 360bbb-3(b)(1), unless the authorization  is terminated or revoked sooner.  Performed at [facility], [place] [address], [place], [place] [address]          Radiology Studies: CT Angio Chest Pulmonary Embolism (PE) W or WO Contrast  Result Date: 02/19/2021 CLINICAL DATA:  Low oxygen saturation recent surgery EXAM: CT ANGIOGRAPHY CHEST WITH CONTRAST TECHNIQUE: Multidetector CT imaging of the chest was performed using the standard protocol during bolus administration of intravenous contrast. Multiplanar CT image reconstructions and MIPs were obtained to evaluate the vascular anatomy. CONTRAST:  42mL OMNIPAQUE IOHEXOL 350 MG/ML SOLN COMPARISON:  Chest x-ray 01/11/2020, CT chest 01/11/2020 FINDINGS: Cardiovascular: Satisfactory opacification of the pulmonary arteries to the segmental level. Multiple acute filling defects within segmental and subsegmental right upper, right middle and right lower lobe pulmonary arteries. Small left lower lobe subsegmental emboli are also present. Nonaneurysmal aorta. No dissection seen. Borderline cardiomegaly. No pericardial effusion Mediastinum/Nodes: Midline trachea. No thyroid mass. No suspicious adenopathy. Esophagus within normal limits. Lungs/Pleura: Dependent atelectasis. Trace right-sided pleural effusion. No pneumothorax Upper Abdomen: No acute abnormality.  Musculoskeletal: No chest wall abnormality. No acute or significant osseous findings. Review of the MIP images confirms the above findings. IMPRESSION: 1. Positive for acute right greater than left bilateral pulmonary emboli involving segmental and subsegmental vessels. 2. Trace right-sided pleural effusion with dependent atelectasis. Borderline cardiomegaly Critical Value/emergent results were called by telephone at the time of interpretation on 02/19/2021 at 6:56 pm to provider [facility] , who verbally acknowledged these results. Electronically Signed   By: Donavan Foil M.D.   On: 02/19/2021 18:56   US Venous Img Lower Bilateral (DVT)  Result Date: 02/20/2021 CLINICAL DATA:  Pulmonary embolism. Evaluate for residual lower extremity DVT. EXAM: BILATERAL LOWER EXTREMITY VENOUS DOPPLER ULTRASOUND TECHNIQUE: Gray-scale sonography with graded compression, as well as color Doppler and duplex ultrasound were performed to evaluate the lower extremity deep venous systems from the level of the common femoral vein and including the common femoral, femoral, profunda femoral, popliteal and calf veins including the posterior tibial, peroneal and gastrocnemius veins when visible. The superficial great saphenous vein was also interrogated. Spectral Doppler was utilized to evaluate flow at rest and with distal augmentation maneuvers in the common femoral, femoral and popliteal veins. COMPARISON:  None. FINDINGS: RIGHT LOWER EXTREMITY Common Femoral Vein: No evidence of thrombus. Normal compressibility, respiratory phasicity and response to augmentation. Saphenofemoral Junction: No evidence of thrombus. Normal compressibility and flow on color Doppler imaging. Profunda Femoral Vein: No evidence of thrombus. Normal compressibility and flow on color Doppler imaging. Femoral Vein: No evidence of thrombus. Normal compressibility, respiratory phasicity and response to augmentation. Popliteal Vein: No evidence of thrombus. Normal  compressibility, respiratory phasicity and response to augmentation. Calf Veins: No evidence of thrombus. Normal compressibility and flow on color Doppler imaging. Superficial Great Saphenous Vein: No evidence of thrombus. Normal compressibility. Venous Reflux:  None. Other Findings:  None. LEFT LOWER EXTREMITY Common Femoral Vein: No evidence of thrombus. Normal compressibility, respiratory phasicity and response to augmentation. Saphenofemoral Junction: No evidence of thrombus. Normal compressibility and flow on color Doppler imaging. Profunda Femoral Vein: No evidence of thrombus. Normal compressibility and flow on color Doppler imaging. Femoral Vein: No evidence of thrombus. Normal compressibility, respiratory phasicity and response to augmentation. Popliteal Vein: No evidence of thrombus. Normal compressibility, respiratory phasicity and response to augmentation. Calf Veins: No evidence of thrombus. Normal compressibility and flow on color Doppler imaging. Superficial Great Saphenous Vein: No evidence of thrombus. Normal compressibility. Venous Reflux:  None.  Other Findings:  None. IMPRESSION: No evidence of deep venous thrombosis in either lower extremity. Electronically Signed   By: Jacqulynn Cadet M.D.   On: 02/20/2021 08:10   ECHOCARDIOGRAM COMPLETE  Result Date: 02/21/2021    ECHOCARDIOGRAM REPORT   Patient Name:   GLENDIA OLSHEFSKI GREEN Date of Exam: 02/20/2021 Medical Rec #:  [id number]      Height:       65.0 in Accession #:    [id number]     Weight:       197.8 lb Date of Birth:  1976-03-22      BSA:          1.969 m Patient Age:    3 years       BP:           124/88 mmHg Patient Gender: F              HR:           85 bpm. Exam Location:  [facility] Procedure: 2D Echo and Strain Analysis Indications:     Pulmonary Embolus I26.09  History:         Patient has no prior history of Echocardiogram examinations.  Sonographer:     Kathlen Brunswick RDCS Referring Phys:  [id number] AMY N COX Diagnosing Phys: Bartholome Bill MD   Sonographer Comments: Global longitudinal strain was attempted. IMPRESSIONS  1. Left ventricular ejection fraction, by estimation, is 60 to 65%. The left ventricle has normal function. The left ventricle has no regional wall motion abnormalities. There is mild left ventricular hypertrophy. Left ventricular diastolic parameters were normal.  2. Right ventricular systolic function was not well visualized. The right ventricular size is normal.  3. The mitral valve is grossly normal. Trivial mitral valve regurgitation.  4. The aortic valve is grossly normal. Aortic valve regurgitation is not visualized. FINDINGS  Left Ventricle: Left ventricular ejection fraction, by estimation, is 60 to 65%. The left ventricle has normal function. The left ventricle has no regional wall motion abnormalities. The left ventricular internal cavity size was normal in size. There is  mild left ventricular hypertrophy. Left ventricular diastolic parameters were normal. Right Ventricle: The right ventricular size is normal. No increase in right ventricular wall thickness. Right ventricular systolic function was not well visualized. Left Atrium: Left atrial size was normal in size. Right Atrium: Right atrial size was normal in size. Pericardium: There is no evidence of pericardial effusion. Mitral Valve: The mitral valve is grossly normal. Trivial mitral valve regurgitation. Tricuspid Valve: The tricuspid valve is grossly normal. Tricuspid valve regurgitation is trivial. Aortic Valve: The aortic valve is grossly normal. Aortic valve regurgitation is not visualized. Aortic valve peak gradient measures 7.6 mmHg. Pulmonic Valve: The pulmonic valve was grossly normal. Pulmonic valve regurgitation is trivial. Aorta: The aortic root is normal in size and structure. IAS/Shunts: The interatrial septum was not assessed.  LEFT VENTRICLE PLAX 2D LVIDd:         4.26 cm  Diastology LVIDs:         2.98 cm  LV e' medial:    12.20 cm/s LV PW:         1.01 cm   LV E/e' medial:  7.6 LV IVS:        1.15 cm  LV e' lateral:   14.10 cm/s LVOT diam:     2.10 cm  LV E/e' lateral: 6.6 LV SV:         68 LV SV Index:  35 LVOT Area:     3.46 cm  RIGHT VENTRICLE RV Basal diam:  2.88 cm RV S prime:     16.40 cm/s TAPSE (M-mode): 2.2 cm LEFT ATRIUM             Index       RIGHT ATRIUM           Index LA diam:        3.50 cm 1.78 cm/m  RA Area:     16.80 cm LA Vol (A2C):   35.9 ml 18.23 ml/m RA Volume:   45.80 ml  23.26 ml/m LA Vol (A4C):   42.6 ml 21.64 ml/m LA Biplane Vol: 43.3 ml 21.99 ml/m  AORTIC VALVE                PULMONIC VALVE AV Area (Vmax): 2.94 cm    PV Vmax:       1.09 m/s AV Vmax:        138.00 cm/s PV Peak grad:  4.8 mmHg AV Peak Grad:   7.6 mmHg LVOT Vmax:      117.00 cm/s LVOT Vmean:     69.200 cm/s LVOT VTI:       0.197 m  AORTA Ao Root diam: 3.10 cm Ao Asc diam:  2.90 cm MITRAL VALVE               TRICUSPID VALVE MV Area (PHT): 5.54 cm    TV Peak grad:   28.9 mmHg MV Decel Time: 137 msec    TV Vmax:        2.69 m/s MV E velocity: 92.50 cm/s MV Marin Wisner velocity: 82.30 cm/s  SHUNTS MV E/Gray Maugeri ratio:  1.12        Systemic VTI:  0.20 m                            Systemic Diam: 2.10 cm Bartholome Bill MD Electronically signed by Bartholome Bill MD Signature Date/Time: 02/21/2021/9:01:03 AM    Final         Scheduled Meds:  docusate sodium  100 mg Oral BID   [START ON 02/22/2021] ferrous sulfate  325 mg Oral Q breakfast   pantoprazole  40 mg Oral Daily   polyethylene glycol  17 g Oral Daily   Continuous Infusions:  heparin 1,300 Units/hr (02/21/21 1209)     LOS: 2 days    Time spent: over 30 min    Fayrene Helper, MD [facility]   To contact the attending provider between 7A-7P or the covering provider during after hours 7P-7A, please log into the web site [email or web address] and access using universal [place] password for that web site. If you do not have the password, please call the hospital operator.  02/21/2021, 4:34 PM

## 2021-02-21 NOTE — Progress Notes (Signed)
Patient ID: Sharon Herring, female   DOB: 1976-05-15, 45 y.o.   MRN: [id number]      POST-OP NOTE - DAY # 3  Subjective:   The patient does not have complaints.  She is ambulating well. She is taking PO well. Her pain is well controlled with her current medications. She is urinating without difficulty and is passing flatus. Breathing comfortably.    Objective:  BP 136/81 (BP Location: Left Wrist)   Pulse 83   Temp 98.9 F (37.2 C)   Resp 17   Ht 5\' 5"  (1.651 m)   Wt 89.7 kg   LMP 02/10/2021 (Exact Date)   SpO2 100%   BMI 32.92 kg/m     Abdomen:                          Abdomen soft and nontender without distention or masses      Drain output = @ 290ml/24hrs Results for orders placed or performed during the hospital encounter of 02/17/21 (from the past 24 hour(s))  Comprehensive metabolic panel     Status: Abnormal   Collection Time: 02/20/21  3:58 PM  Result Value Ref Range   Sodium 137 135 - 145 mmol/L   Potassium 3.6 3.5 - 5.1 mmol/L   Chloride 105 98 - 111 mmol/L   CO2 28 22 - 32 mmol/L   Glucose, Bld 91 70 - 99 mg/dL   BUN 9 6 - 20 mg/dL   Creatinine, Ser 0.53 0.44 - 1.00 mg/dL   Calcium 8.2 (L) 8.9 - 10.3 mg/dL   Total Protein 6.0 (L) 6.5 - 8.1 g/dL   Albumin 2.8 (L) 3.5 - 5.0 g/dL   AST 12 (L) 15 - 41 U/L   ALT 9 0 - 44 U/L   Alkaline Phosphatase 44 38 - 126 U/L   Total Bilirubin 0.6 0.3 - 1.2 mg/dL   GFR, Estimated >60 >60 mL/min   Anion gap 4 (L) 5 - 15  Hemoglobin and hematocrit, blood     Status: Abnormal   Collection Time: 02/20/21  3:58 PM  Result Value Ref Range   Hemoglobin 7.7 (L) 12.0 - 15.0 g/dL   HCT 22.4 (L) 36.0 - 46.0 %  Type and screen [facility]     Status: None (Preliminary result)   Collection Time: 02/20/21  3:58 PM  Result Value Ref Range   ABO/RH(D) A POS    Antibody Screen POS    Sample Expiration 02/23/2021,2359    Antibody Identification ANTI K    Unit Number [id number]    Blood Component Type RED CELLS,LR     Unit division 00    Status of Unit ALLOCATED    Transfusion Status OK TO TRANSFUSE    Crossmatch Result COMPATIBLE    Unit Number [id number]    Blood Component Type RED CELLS,LR    Unit division 00    Status of Unit ALLOCATED    Transfusion Status OK TO TRANSFUSE    Crossmatch Result COMPATIBLE   Heparin level (unfractionated)     Status: None   Collection Time: 02/20/21  3:58 PM  Result Value Ref Range   Heparin Unfractionated 0.47 0.30 - 0.70 IU/mL  CBC with Differential/Platelet     Status: Abnormal   Collection Time: 02/21/21  5:36 AM  Result Value Ref Range   WBC 12.5 (H) 4.0 - 10.5 K/uL   RBC 3.30 (L) 3.87 - 5.11 MIL/uL   Hemoglobin  7.6 (L) 12.0 - 15.0 g/dL   HCT 22.9 (L) 36.0 - 46.0 %   MCV 69.4 (L) 80.0 - 100.0 fL   MCH 23.0 (L) 26.0 - 34.0 pg   MCHC 33.2 30.0 - 36.0 g/dL   RDW 29.8 (H) 11.5 - 15.5 %   Platelets 470 (H) 150 - 400 K/uL   nRBC 0.2 0.0 - 0.2 %   Neutrophils Relative % 67 %   Neutro Abs 8.4 (H) 1.7 - 7.7 K/uL   Lymphocytes Relative 23 %   Lymphs Abs 2.9 0.7 - 4.0 K/uL   Monocytes Relative 6 %   Monocytes Absolute 0.7 0.1 - 1.0 K/uL   Eosinophils Relative 3 %   Eosinophils Absolute 0.4 0.0 - 0.5 K/uL   Basophils Relative 0 %   Basophils Absolute 0.0 0.0 - 0.1 K/uL   WBC Morphology MORPHOLOGY UNREMARKABLE    RBC Morphology MIXED RBC MORPHOLOGY PRESENT    Smear Review Normal platelet morphology    Immature Granulocytes 1 %   Abs Immature Granulocytes 0.13 (H) 0.00 - 0.07 K/uL   Polychromasia PRESENT   Comprehensive metabolic panel     Status: Abnormal   Collection Time: 02/21/21  5:36 AM  Result Value Ref Range   Sodium 139 135 - 145 mmol/L   Potassium 3.7 3.5 - 5.1 mmol/L   Chloride 104 98 - 111 mmol/L   CO2 29 22 - 32 mmol/L   Glucose, Bld 91 70 - 99 mg/dL   BUN 10 6 - 20 mg/dL   Creatinine, Ser 0.62 0.44 - 1.00 mg/dL   Calcium 8.4 (L) 8.9 - 10.3 mg/dL   Total Protein 6.0 (L) 6.5 - 8.1 g/dL   Albumin 2.7 (L) 3.5 - 5.0 g/dL   AST 13 (L)  15 - 41 U/L   ALT 9 0 - 44 U/L   Alkaline Phosphatase 45 38 - 126 U/L   Total Bilirubin 0.5 0.3 - 1.2 mg/dL   GFR, Estimated >60 >60 mL/min   Anion gap 6 5 - 15  Magnesium     Status: None   Collection Time: 02/21/21  5:36 AM  Result Value Ref Range   Magnesium 1.8 1.7 - 2.4 mg/dL  Phosphorus     Status: None   Collection Time: 02/21/21  5:36 AM  Result Value Ref Range   Phosphorus 3.2 2.5 - 4.6 mg/dL  Heparin level (unfractionated)     Status: None   Collection Time: 02/21/21  5:36 AM  Result Value Ref Range   Heparin Unfractionated 0.41 0.30 - 0.70 IU/mL  Iron and TIBC     Status: Abnormal   Collection Time: 02/21/21  5:36 AM  Result Value Ref Range   Iron 26 (L) 28 - 170 ug/dL   TIBC 319 250 - 450 ug/dL   Saturation Ratios 8 (L) 10.4 - 31.8 %   UIBC 293 ug/dL  Ferritin     Status: None   Collection Time: 02/21/21  5:36 AM  Result Value Ref Range   Ferritin 31 11 - 307 ng/mL  Folate     Status: None   Collection Time: 02/21/21  5:36 AM  Result Value Ref Range   Folate 7.4 >5.9 ng/mL       Assessment:   Stable PE - anticoagulated   Surgically stable - mod amount of drainage from JP, Stable H&H     Plan:        Follow H&H and JP drain ouput   Probable d/c tomorrow  Coordinate care with Powell/Hospitalist   Finis Bud, M.D. 02/21/2021 11:11 AM

## 2021-02-22 DIAGNOSIS — I2699 Other pulmonary embolism without acute cor pulmonale: Secondary | ICD-10-CM | POA: Diagnosis present

## 2021-02-22 LAB — TYPE AND SCREEN
ABO/RH(D): A POS
Antibody Screen: POSITIVE
Donor AG Type: NEGATIVE
Donor AG Type: NEGATIVE
Donor AG Type: NEGATIVE
Donor AG Type: NEGATIVE
PT AG Type: NEGATIVE
Unit division: 0
Unit division: 0
Unit division: 0
Unit division: 0
Unit division: 0

## 2021-02-22 LAB — COMPREHENSIVE METABOLIC PANEL
ALT: 15 U/L (ref 0–44)
AST: 23 U/L (ref 15–41)
Albumin: 2.7 g/dL — ABNORMAL LOW (ref 3.5–5.0)
Alkaline Phosphatase: 59 U/L (ref 38–126)
Anion gap: 6 (ref 5–15)
BUN: 8 mg/dL (ref 6–20)
CO2: 26 mmol/L (ref 22–32)
Calcium: 8.5 mg/dL — ABNORMAL LOW (ref 8.9–10.3)
Chloride: 105 mmol/L (ref 98–111)
Creatinine, Ser: 0.58 mg/dL (ref 0.44–1.00)
GFR, Estimated: >60 mL/min (ref >60)
Glucose, Bld: 99 mg/dL (ref 70–99)
Potassium: 3.7 mmol/L (ref 3.5–5.1)
Sodium: 137 mmol/L (ref 135–145)
Total Bilirubin: 0.7 mg/dL (ref 0.3–1.2)
Total Protein: 6.1 g/dL — ABNORMAL LOW (ref 6.5–8.1)

## 2021-02-22 LAB — BPAM RBC
Blood Product Expiration Date: 202207302359
Blood Product Expiration Date: 202207302359
Blood Product Expiration Date: 202207302359
Blood Product Expiration Date: 202207302359
Blood Product Expiration Date: 202207302359
ISSUE DATE / TIME: 202206291624
ISSUE DATE / TIME: 202206291952
ISSUE DATE / TIME: 202206292149
ISSUE DATE / TIME: 202207021057
Unit Type and Rh: 6200
Unit Type and Rh: 6200
Unit Type and Rh: 6200
Unit Type and Rh: 6200
Unit Type and Rh: 6200

## 2021-02-22 LAB — CBC
HCT: 23.9 % — ABNORMAL LOW (ref 36.0–46.0)
Hemoglobin: 8.1 g/dL — ABNORMAL LOW (ref 12.0–15.0)
MCH: 23.9 pg — ABNORMAL LOW (ref 26.0–34.0)
MCHC: 33.9 g/dL (ref 30.0–36.0)
MCV: 70.5 fL — ABNORMAL LOW (ref 80.0–100.0)
Platelets: 363 10*3/uL (ref 150–400)
RBC: 3.39 MIL/uL — ABNORMAL LOW (ref 3.87–5.11)
RDW: 29.3 % — ABNORMAL HIGH (ref 11.5–15.5)
WBC: 8.6 10*3/uL (ref 4.0–10.5)
nRBC: 0.4 % — ABNORMAL HIGH (ref 0.0–0.2)

## 2021-02-22 LAB — CBC WITH DIFFERENTIAL/PLATELET
Abs Immature Granulocytes: 0.13 10*3/uL — ABNORMAL HIGH (ref 0.00–0.07)
Basophils Absolute: 0 10*3/uL (ref 0.0–0.1)
Basophils Relative: 0 %
Eosinophils Absolute: 0.2 10*3/uL (ref 0.0–0.5)
Eosinophils Relative: 3 %
HCT: 23.5 % — ABNORMAL LOW (ref 36.0–46.0)
Hemoglobin: 7.6 g/dL — ABNORMAL LOW (ref 12.0–15.0)
Immature Granulocytes: 2 %
Lymphocytes Relative: 22 %
Lymphs Abs: 1.9 10*3/uL (ref 0.7–4.0)
MCH: 22.3 pg — ABNORMAL LOW (ref 26.0–34.0)
MCHC: 32.3 g/dL (ref 30.0–36.0)
MCV: 68.9 fL — ABNORMAL LOW (ref 80.0–100.0)
Monocytes Absolute: 0.7 10*3/uL (ref 0.1–1.0)
Monocytes Relative: 8 %
Neutro Abs: 5.8 10*3/uL (ref 1.7–7.7)
Neutrophils Relative %: 65 %
Platelets: 459 10*3/uL — ABNORMAL HIGH (ref 150–400)
RBC: 3.41 MIL/uL — ABNORMAL LOW (ref 3.87–5.11)
RDW: 29.6 % — ABNORMAL HIGH (ref 11.5–15.5)
Smear Review: NORMAL
WBC: 8.9 10*3/uL (ref 4.0–10.5)
nRBC: 0.6 % — ABNORMAL HIGH (ref 0.0–0.2)

## 2021-02-22 LAB — MAGNESIUM: Magnesium: 1.9 mg/dL (ref 1.7–2.4)

## 2021-02-22 LAB — PHOSPHORUS: Phosphorus: 2.9 mg/dL (ref 2.5–4.6)

## 2021-02-22 LAB — HEPARIN LEVEL (UNFRACTIONATED): Heparin Unfractionated: 0.39 IU/mL (ref 0.30–0.70)

## 2021-02-22 MED ORDER — RIVAROXABAN 15 MG PO TABS
15.0000 mg | ORAL_TABLET | Freq: Two times a day (BID) | ORAL | Status: DC
  Filled 2021-02-22: qty 1

## 2021-02-22 MED ORDER — LIDOCAINE-EPINEPHRINE (PF) 1 %-1:200000 IJ SOLN
10.0000 mL | INTRAMUSCULAR | Status: DC
  Filled 2021-02-22: qty 10

## 2021-02-22 MED ORDER — APIXABAN 5 MG PO TABS: 5.0000 mg | ORAL_TABLET | Freq: Two times a day (BID) | ORAL | Status: DC

## 2021-02-22 MED ORDER — RIVAROXABAN (XARELTO) VTE STARTER PACK (15 & 20 MG): ORAL_TABLET | ORAL | 0 refills | Status: DC

## 2021-02-22 MED ORDER — PANTOPRAZOLE SODIUM 40 MG PO TBEC: 40.0000 mg | DELAYED_RELEASE_TABLET | Freq: Every day | ORAL | 0 refills | Status: DC

## 2021-02-22 MED ORDER — APIXABAN (ELIQUIS) VTE STARTER PACK (10MG AND 5MG): ORAL_TABLET | ORAL | 0 refills | Status: DC

## 2021-02-22 MED ORDER — RIVAROXABAN 20 MG PO TABS: 20.0000 mg | ORAL_TABLET | Freq: Every day | ORAL | Status: DC

## 2021-02-22 MED ORDER — APIXABAN 5 MG PO TABS
10.0000 mg | ORAL_TABLET | Freq: Two times a day (BID) | ORAL | Status: DC
  Administered 2021-02-22: 10 mg via ORAL
  Filled 2021-02-22: qty 2

## 2021-02-22 NOTE — Discharge Summary (Signed)
Physician Discharge Summary  Sharon Herring BWL:[id number] DOB: 14-Sep-1975 DOA: 02/17/2021  PCP: Steele Sizer, MD  Admit date: 02/17/2021 Discharge date: 02/22/2021  Time spent: 40 minutes  Recommendations for Outpatient Follow-up:  Follow outpatient CBC/CMP Continue anticoagulation with eliquis for 3-6 months.  After this discuss risk factors with PCP, given provoked VTE by surgery, reasonable to d/c after 3-6 months, though would assess for risk factors Follow with gyn for plan for bleeding on anticoagulation with menstrual cycles Ensure up to date cancer screening   Post op care per gynecology Follow up regarding cost for eliquis - ensure 10 dollar copay card workiing out Follow outpatient CXR  Discharge Diagnoses:  Active Problems:   Anemia associated with acute blood loss   S/P myomectomy   Acute pulmonary embolism (HCC)   Pulmonary emboli (HCC)   Discharge Condition: stable  Diet recommendation: heart healthy  Filed Weights   02/17/21 1636 02/19/21 2319  Weight: 89.4 kg 89.7 kg    History of present illness:  Sharon Herring is Sharon Herring 45 y.o. female with medical history significant for uterine fibroid, history of concussion without loss of consciousness, obesity, iron deficiency anemia, menorrhagia, s/p myomectomy on 02/18/21.  TRH consulted for pulmonary embolism and hypoxia.  She was admitted for myomectomy and developed postoperative fever and hypoxia.  Workup notable for PE.  TRH consulted.  Started on heparin.  Echo with normal EF, RVSF not well visualized.  LE [name] negative for DVT.  Transfused as appropriate.  Transitioned to eliquis on day of discharge with plan for 3-6 months anticoagulation.  Hospital Course:  Acute Pulmonary Embolism  Acute Hypoxic Respiratory Failure CT with acute R>L bilateral pulm emboli, trace R sided effusion Likely provoked with surgery Heparin gtt LE [name] negative for DVT Echo 60-65%, no RWMA, RVSF not well visualized  Continue heparin gtt  -> transition to eliquis at discharge.  Called pharmacy yesterday, unable to give cost.  Initially prescribed and sent xarelto to pharmacy, but called and they told me xarelto 900 dollars, eliquis 600.  Canceled xarelto prescription by phone.  Discharged with eliquis.  Social work gave [employer] copay card.   Wean oxygen as tolerated (on RA) Will need 3-6 months uninterrupted anticoagulation.  After this, would recommend discussing risk factors with PCP and consider d/c'ing anticoagulation.  Reasonable to d/c anticoagulation with known provoking risk factor (surgery), no hx prior VTE - pending evaluation for other persistent risk factors.   Acute on Chronic Blood Loss Anemia  Iron Def Anemia 2/2 surgery, post op blood loss on anticoagulation S/p 4 unit pRBC since 6/29 Drain removed by gyn 7/4. Hb stable on anticoagulation on day of discharge   Uterine Fibroids S/p xi robotic assisted myomectomy chromopertubation Per gynecology Discuss plan regarding periods on anticoagulation  Procedures: Robotic myomectomy, chromopertubation    Echo IMPRESSIONS     1. Left ventricular ejection fraction, by estimation, is 60 to 65%. The  left ventricle has normal function. The left ventricle has no regional  wall motion abnormalities. There is mild left ventricular hypertrophy.  Left ventricular diastolic parameters  were normal.   2. Right ventricular systolic function was not well visualized. The right  ventricular size is normal.   3. The mitral valve is grossly normal. Trivial mitral valve  regurgitation.   4. The aortic valve is grossly normal. Aortic valve regurgitation is not  visualized.  IMPRESSION: No evidence of deep venous thrombosis in either lower extremity. Consultations: gyn  Discharge Exam: Vitals:  02/22/21 0741 02/22/21 1202  BP: 132/78 135/89  Pulse: 83 80  Resp:    Temp: 98.4 F (36.9 C) 98.7 F (37.1 C)  SpO2: 98% 100%   Eager to discharge Husband wrapped  head in toilet paper as Taylor Spilde joke, laughing in bed No new complaints Discussed discharge recommendations  General: No acute distress. Cardiovascular: RRR Lungs: unlabored, on RA Abdomen: Soft, approopriattely tender post op Neurological: Alert and oriented 3. Moves all extremities 4 . Cranial nerves II through XII grossly intact. Skin: Warm and dry. No rashes or lesions. Extremities: No clubbing or cyanosis. No edema.  Discharge Instructions   Discharge Instructions     Call MD for:  difficulty breathing, headache or visual disturbances   Complete by: As directed    Call MD for:  extreme fatigue   Complete by: As directed    Call MD for:  hives   Complete by: As directed    Call MD for:  persistant dizziness or light-headedness   Complete by: As directed    Call MD for:  persistant nausea and vomiting   Complete by: As directed    Call MD for:  redness, tenderness, or signs of infection (pain, swelling, redness, odor or green/yellow discharge around incision site)   Complete by: As directed    Call MD for:  severe uncontrolled pain   Complete by: As directed    Call MD for:  temperature >100.4   Complete by: As directed    Diet - low sodium heart healthy   Complete by: As directed    Discharge instructions   Complete by: As directed    You were seen by gynecology for robotic surgery for your fibroid uterus.  Follow gynecology post op instructions.  You developed pulmonary emboli (blood clots in the lungs) post op.  I believe this was most likely provoked in the setting of your surgery.  You'll need 3-6 months of uninterrupted anticoagulation.  After this, please review with your PCP any continued risk factors for clots, but I believe you'll probably be able to stop your blood thinner at this time.  I'll send you with Ambriel Gorelick 30 day starter pack of eliquis.  You'll need to get refills from your PCP.  The cost of eliquis is very high, but we'll send you with Priscila Bean 30 day free card.  Look  into the pharmacy assistance program with your PCP, they maybe able to get you discounted price.  If eliquis is too expensive long term, you should transition to another medicine with your PCP.  You should avoid hormonal contraception given the risk of blood clots associated with this (discuss with Dr. Marcelline Mates).  Please discuss what to do during your menstrual cycles with your anemia as you're now on Talin Rozeboom blood thinner.   Return for new, recurrent, or worsening symptoms.  Please ask your PCP to request records from this hospitalization so they know what was done and what the next steps will be.   Discharge wound care:   Complete by: As directed    As recommended by gynecology   Increase activity slowly   Complete by: As directed       Allergies as of 02/22/2021       Reactions   Aspirin Nausea And Vomiting   Oxycodone-acetaminophen Rash, Shortness Of Breath   Lexapro [escitalopram] Palpitations   Tape Other (See Comments)   Burning feeling. Has left mark on skin.   Belviq [lorcaserin Hcl] Other (See Comments)   headache  Dicyclomine Nausea And Vomiting   Contrave [naltrexone-bupropion Hcl Er] Anxiety   Altered mental state   Norethindrone Rash        Medication List     STOP taking these medications    ibuprofen 800 MG tablet Commonly known as: ADVIL       TAKE these medications    acetaminophen 500 MG tablet Commonly known as: TYLENOL Take 2 tablets (1,000 mg total) by mouth every 6 (six) hours.   albuterol (2.5 MG/3ML) 0.083% nebulizer solution Commonly known as: PROVENTIL Take 3 mLs (2.5 mg total) by nebulization every 6 (six) hours as needed for wheezing or shortness of breath.   albuterol 108 (90 Base) MCG/ACT inhaler Commonly known as: VENTOLIN HFA Inhale 2 puffs into the lungs every 6 (six) hours as needed for wheezing or shortness of breath.   Apixaban Starter Pack (10mg  and 5mg ) Commonly known as: ELIQUIS STARTER PACK Take as directed on package: start  with two-5mg  tablets twice daily for 7 days. On day 8, switch to one-5mg  tablet twice daily.   docusate sodium 100 MG capsule Commonly known as: COLACE Take 1 capsule (100 mg total) by mouth 2 (two) times daily.   ferrous sulfate 325 (65 FE) MG tablet Commonly known as: FerrouSul Take 1 tablet (325 mg total) by mouth 3 (three) times daily with meals. What changed: when to take this   HYDROmorphone 2 MG tablet Commonly known as: DILAUDID Take 1 tablet (2 mg total) by mouth every 4 (four) hours as needed for moderate pain or severe pain (may take up to 2 tablets every 6 hours for severe pain).   linaclotide 290 MCG Caps capsule Commonly known as: Linzess Take 1 capsule (290 mcg total) by mouth daily before breakfast. What changed:  when to take this reasons to take this   Multivitamin Adult Tabs Take 1 tablet by mouth daily.   pantoprazole 40 MG tablet Commonly known as: PROTONIX Take 1 tablet (40 mg total) by mouth daily. Start taking on: February 23, 2021   simethicone 80 MG chewable tablet Commonly known as: MYLICON Chew 1 tablet (80 mg total) by mouth 4 (four) times daily as needed for flatulence.       ASK your doctor about these medications    fluticasone 50 MCG/ACT nasal spray Commonly known as: FLONASE Place 2 sprays into both nostrils daily.   furosemide 20 MG tablet Commonly known as: LASIX Take 1 tablet (20 mg total) by mouth daily.               Discharge Care Instructions  (From admission, onward)           Start     Ordered   02/22/21 0000  Discharge wound care:       Comments: As recommended by gynecology   02/22/21 1127           Allergies  Allergen Reactions   Aspirin Nausea And Vomiting   Oxycodone-Acetaminophen Rash and Shortness Of Breath   Lexapro [Escitalopram] Palpitations   Tape Other (See Comments)    Burning feeling. Has left mark on skin.   Belviq [Lorcaserin Hcl] Other (See Comments)    headache   Dicyclomine Nausea  And Vomiting   Contrave [Naltrexone-Bupropion Hcl Er] Anxiety    Altered mental state   Norethindrone Rash      The results of significant diagnostics from this hospitalization (including imaging, microbiology, ancillary and laboratory) are listed below for reference.    Significant Diagnostic Studies:  CT Angio Chest Pulmonary Embolism (PE) W or WO Contrast  Result Date: 02/19/2021 CLINICAL DATA:  Low oxygen saturation recent surgery EXAM: CT ANGIOGRAPHY CHEST WITH CONTRAST TECHNIQUE: Multidetector CT imaging of the chest was performed using the standard protocol during bolus administration of intravenous contrast. Multiplanar CT image reconstructions and MIPs were obtained to evaluate the vascular anatomy. CONTRAST:  69mL OMNIPAQUE IOHEXOL 350 MG/ML SOLN COMPARISON:  Chest x-ray 01/11/2020, CT chest 01/11/2020 FINDINGS: Cardiovascular: Satisfactory opacification of the pulmonary arteries to the segmental level. Multiple acute filling defects within segmental and subsegmental right upper, right middle and right lower lobe pulmonary arteries. Small left lower lobe subsegmental emboli are also present. Nonaneurysmal aorta. No dissection seen. Borderline cardiomegaly. No pericardial effusion Mediastinum/Nodes: Midline trachea. No thyroid mass. No suspicious adenopathy. Esophagus within normal limits. Lungs/Pleura: Dependent atelectasis. Trace right-sided pleural effusion. No pneumothorax Upper Abdomen: No acute abnormality. Musculoskeletal: No chest wall abnormality. No acute or significant osseous findings. Review of the MIP images confirms the above findings. IMPRESSION: 1. Positive for acute right greater than left bilateral pulmonary emboli involving segmental and subsegmental vessels. 2. Trace right-sided pleural effusion with dependent atelectasis. Borderline cardiomegaly Critical Value/emergent results were called by telephone at the time of interpretation on 02/19/2021 at 6:56 pm to provider [facility] , who verbally acknowledged these results. Electronically Signed   By: Donavan Foil M.D.   On: 02/19/2021 18:56   US Venous Img Lower Bilateral (DVT)  Result Date: 02/20/2021 CLINICAL DATA:  Pulmonary embolism. Evaluate for residual lower extremity DVT. EXAM: BILATERAL LOWER EXTREMITY VENOUS DOPPLER ULTRASOUND TECHNIQUE: Gray-scale sonography with graded compression, as well as color Doppler and duplex ultrasound were performed to evaluate the lower extremity deep venous systems from the level of the common femoral vein and including the common femoral, femoral, profunda femoral, popliteal and calf veins including the posterior tibial, peroneal and gastrocnemius veins when visible. The superficial great saphenous vein was also interrogated. Spectral Doppler was utilized to evaluate flow at rest and with distal augmentation maneuvers in the common femoral, femoral and popliteal veins. COMPARISON:  None. FINDINGS: RIGHT LOWER EXTREMITY Common Femoral Vein: No evidence of thrombus. Normal compressibility, respiratory phasicity and response to augmentation. Saphenofemoral Junction: No evidence of thrombus. Normal compressibility and flow on color Doppler imaging. Profunda Femoral Vein: No evidence of thrombus. Normal compressibility and flow on color Doppler imaging. Femoral Vein: No evidence of thrombus. Normal compressibility, respiratory phasicity and response to augmentation. Popliteal Vein: No evidence of thrombus. Normal compressibility, respiratory phasicity and response to augmentation. Calf Veins: No evidence of thrombus. Normal compressibility and flow on color Doppler imaging. Superficial Great Saphenous Vein: No evidence of thrombus. Normal compressibility. Venous Reflux:  None. Other Findings:  None. LEFT LOWER EXTREMITY Common Femoral Vein: No evidence of thrombus. Normal compressibility, respiratory phasicity and response to augmentation. Saphenofemoral Junction: No evidence of thrombus. Normal  compressibility and flow on color Doppler imaging. Profunda Femoral Vein: No evidence of thrombus. Normal compressibility and flow on color Doppler imaging. Femoral Vein: No evidence of thrombus. Normal compressibility, respiratory phasicity and response to augmentation. Popliteal Vein: No evidence of thrombus. Normal compressibility, respiratory phasicity and response to augmentation. Calf Veins: No evidence of thrombus. Normal compressibility and flow on color Doppler imaging. Superficial Great Saphenous Vein: No evidence of thrombus. Normal compressibility. Venous Reflux:  None. Other Findings:  None. IMPRESSION: No evidence of deep venous thrombosis in either lower extremity. Electronically Signed   By: Jacqulynn Cadet M.D.   On: 02/20/2021 08:10  ECHOCARDIOGRAM COMPLETE  Result Date: 02/21/2021    ECHOCARDIOGRAM REPORT   Patient Name:   BEYZA BELLINO GREEN Date of Exam: 02/20/2021 Medical Rec #:  [id number]      Height:       65.0 in Accession #:    [id number]     Weight:       197.8 lb Date of Birth:  [birth year]      BSA:          1.969 m Patient Age:    14 years       BP:           124/88 mmHg Patient Gender: F              HR:           85 bpm. Exam Location:  [facility] Procedure: 2D Echo and Strain Analysis Indications:     Pulmonary Embolus I26.09  History:         Patient has no prior history of Echocardiogram examinations.  Sonographer:     Kathlen Brunswick RDCS Referring Phys:  [id number] AMY N COX Diagnosing Phys: Bartholome Bill MD  Sonographer Comments: Global longitudinal strain was attempted. IMPRESSIONS  1. Left ventricular ejection fraction, by estimation, is 60 to 65%. The left ventricle has normal function. The left ventricle has no regional wall motion abnormalities. There is mild left ventricular hypertrophy. Left ventricular diastolic parameters were normal.  2. Right ventricular systolic function was not well visualized. The right ventricular size is normal.  3. The mitral valve is grossly normal.  Trivial mitral valve regurgitation.  4. The aortic valve is grossly normal. Aortic valve regurgitation is not visualized. FINDINGS  Left Ventricle: Left ventricular ejection fraction, by estimation, is 60 to 65%. The left ventricle has normal function. The left ventricle has no regional wall motion abnormalities. The left ventricular internal cavity size was normal in size. There is  mild left ventricular hypertrophy. Left ventricular diastolic parameters were normal. Right Ventricle: The right ventricular size is normal. No increase in right ventricular wall thickness. Right ventricular systolic function was not well visualized. Left Atrium: Left atrial size was normal in size. Right Atrium: Right atrial size was normal in size. Pericardium: There is no evidence of pericardial effusion. Mitral Valve: The mitral valve is grossly normal. Trivial mitral valve regurgitation. Tricuspid Valve: The tricuspid valve is grossly normal. Tricuspid valve regurgitation is trivial. Aortic Valve: The aortic valve is grossly normal. Aortic valve regurgitation is not visualized. Aortic valve peak gradient measures 7.6 mmHg. Pulmonic Valve: The pulmonic valve was grossly normal. Pulmonic valve regurgitation is trivial. Aorta: The aortic root is normal in size and structure. IAS/Shunts: The interatrial septum was not assessed.  LEFT VENTRICLE PLAX 2D LVIDd:         4.26 cm  Diastology LVIDs:         2.98 cm  LV e' medial:    12.20 cm/s LV PW:         1.01 cm  LV E/e' medial:  7.6 LV IVS:        1.15 cm  LV e' lateral:   14.10 cm/s LVOT diam:     2.10 cm  LV E/e' lateral: 6.6 LV SV:         68 LV SV Index:   35 LVOT Area:     3.46 cm  RIGHT VENTRICLE RV Basal diam:  2.88 cm RV S prime:     16.40 cm/s TAPSE (M-mode): 2.2 cm LEFT  ATRIUM             Index       RIGHT ATRIUM           Index LA diam:        3.50 cm 1.78 cm/m  RA Area:     16.80 cm LA Vol (A2C):   35.9 ml 18.23 ml/m RA Volume:   45.80 ml  23.26 ml/m LA Vol (A4C):   42.6  ml 21.64 ml/m LA Biplane Vol: 43.3 ml 21.99 ml/m  AORTIC VALVE                PULMONIC VALVE AV Area (Vmax): 2.94 cm    PV Vmax:       1.09 m/s AV Vmax:        138.00 cm/s PV Peak grad:  4.8 mmHg AV Peak Grad:   7.6 mmHg LVOT Vmax:      117.00 cm/s LVOT Vmean:     69.200 cm/s LVOT VTI:       0.197 m  AORTA Ao Root diam: 3.10 cm Ao Asc diam:  2.90 cm MITRAL VALVE               TRICUSPID VALVE MV Area (PHT): 5.54 cm    TV Peak grad:   28.9 mmHg MV Decel Time: 137 msec    TV Vmax:        2.69 m/s MV E velocity: 92.50 cm/s MV Jashun Puertas velocity: 82.30 cm/s  SHUNTS MV E/Chyanna Flock ratio:  1.12        Systemic VTI:  0.20 m                            Systemic Diam: 2.10 cm Bartholome Bill MD Electronically signed by Bartholome Bill MD Signature Date/Time: 02/21/2021/9:01:03 AM    Final     Microbiology: Recent Results (from the past 240 hour(s))  SARS CORONAVIRUS 2 (TAT 6-24 HRS) Nasopharyngeal Nasopharyngeal Swab     Status: None   Collection Time: 02/16/21  8:25 AM   Specimen: Nasopharyngeal Swab  Result Value Ref Range Status   SARS Coronavirus 2 NEGATIVE NEGATIVE Final    Comment: (NOTE) SARS-CoV-2 target nucleic acids are NOT DETECTED.  The SARS-CoV-2 RNA is generally detectable in upper and lower respiratory specimens during the acute phase of infection. Negative results do not preclude SARS-CoV-2 infection, do not rule out co-infections with other pathogens, and should not be used as the sole basis for treatment or other patient management decisions. Negative results must be combined with clinical observations, patient history, and epidemiological information. The expected result is Negative.  Fact Sheet for Patients: SugarRoll.be  Fact Sheet for Healthcare Providers: https://www.woods-mathews.com/  This test is not yet approved or cleared by the [place] FDA and  has been authorized for detection and/or diagnosis of SARS-CoV-2 by FDA under an Emergency Use  Authorization (EUA). This EUA will remain  in effect (meaning this test can be used) for the duration of the COVID-19 declaration under Se ction 564(b)(1) of the Act, 21 U.S.C. section 360bbb-3(b)(1), unless the authorization is terminated or revoked sooner.  Performed at [facility], [place] [address] [place], [place] [address]      Labs: Basic Metabolic Panel: Recent Labs  Lab 02/16/21 0093 02/19/21 1155 02/20/21 1558 02/21/21 0536 02/22/21 0621  NA 139 135 137 139 137  K 3.7 3.9 3.6 3.7 3.7  CL 108 103 105 104 105  CO2 27 25 28 29 26   GLUCOSE 98 115* 91 91 99  BUN 10 9 9 10 8   CREATININE 0.67 0.75 0.53 0.62 0.58  CALCIUM 8.8* 8.2* 8.2* 8.4* 8.5*  MG  --   --   --  1.8 1.9  PHOS  --   --   --  3.2 2.9   Liver Function Tests: Recent Labs  Lab 02/16/21 0833 02/19/21 1155 02/20/21 1558 02/21/21 0536 02/22/21 0621  AST 16 19 12* 13* 23  ALT 9 9 9 9 15   ALKPHOS 62 47 44 45 59  BILITOT 0.3 0.6 0.6 0.5 0.7  PROT 7.4 6.3* 6.0* 6.0* 6.1*  ALBUMIN 3.6 3.0* 2.8* 2.7* 2.7*   No results for input(s): LIPASE, AMYLASE in the last 168 hours. No results for input(s): AMMONIA in the last 168 hours. CBC: Recent Labs  Lab 02/19/21 0706 02/19/21 1155 02/20/21 0531 02/20/21 1558 02/21/21 0536 02/21/21 1636 02/22/21 0621 02/22/21 0848  WBC 17.2* 17.1* 15.5*  --  12.5*  --  8.9 8.6  NEUTROABS 14.9*  --   --   --  8.4*  --  5.8  --   HGB 7.8* 7.9* 7.0* 7.7* 7.6* 7.6* 7.6* 8.1*  HCT 22.6* 22.9* 21.1* 22.4* 22.9* 22.5* 23.5* 23.9*  MCV 67.1* 68.4* 67.6*  --  69.4*  --  68.9* 70.5*  PLT 649* 358 355  --  470*  --  459* 363   Cardiac Enzymes: No results for input(s): CKTOTAL, CKMB, CKMBINDEX, TROPONINI in the last 168 hours. BNP: BNP (last 3 results) No results for input(s): BNP in the last 8760 hours.  ProBNP (last 3 results) No results for input(s): PROBNP in the last 8760 hours.  CBG: No results for input(s): GLUCAP in the last 168  hours.     Signed:  Fayrene Helper MD.  [facility] 02/22/2021, 4:42 PM

## 2021-02-22 NOTE — Progress Notes (Signed)
ANTICOAGULATION CONSULT NOTE  Pharmacy Consult for Heparin Drip Indication: pulmonary embolus  Allergies  Allergen Reactions   Aspirin Nausea And Vomiting   Oxycodone-Acetaminophen Rash and Shortness Of Breath   Lexapro [Escitalopram] Palpitations   Tape Other (See Comments)    Burning feeling. Has left mark on skin.   Belviq [Lorcaserin Hcl] Other (See Comments)    headache   Dicyclomine Nausea And Vomiting   Contrave [Naltrexone-Bupropion Hcl Er] Anxiety    Altered mental state   Norethindrone Rash    Patient Measurements: Height: 5\' 5"  (165.1 cm) Weight: 89.7 kg (197 lb 12.8 oz) IBW/kg (Calculated) : 57 Heparin Dosing Weight: 76.7 kg  Vital Signs: Temp: 99.6 F (37.6 C) (07/04 0343) Temp Source: Oral (07/04 0343) BP: 128/81 (07/04 0343) Pulse Rate: 74 (07/04 0343)  Labs: Recent Labs    02/19/21 1155 02/19/21 1155 02/19/21 1914 02/20/21 0531 02/20/21 1558 02/21/21 0536 02/21/21 1636 02/22/21 0621  HGB 7.9*  --   --  7.0* 7.7* 7.6* 7.6*  --   HCT 22.9*  --   --  21.1* 22.4* 22.9* 22.5*  --   PLT 358  --   --  355  --  470*  --   --   APTT  --   --  32  --   --   --   --   --   LABPROT  --   --  16.3*  --   --   --   --   --   INR  --   --  1.3*  --   --   --   --   --   HEPARINUNFRC  --    < >  --  0.58 0.47 0.41  --  0.39  CREATININE 0.75  --   --   --  0.53 0.62  --  0.58   < > = values in this interval not displayed.     Estimated Creatinine Clearance: 100.3 mL/min (by C-G formula based on SCr of 0.58 mg/dL).   Medical History: Past Medical History:  Diagnosis Date   Anemia    Dizziness    Fibroid uterus    GERD (gastroesophageal reflux disease)    History of thrombocytosis    History of uterine fibroid 03/14/2014   myomectomy   IBS (irritable bowel syndrome)    Migraine    Polyp of stomach 01/06/2016   Stomach polyp, pyloric, consistent with prolapse.     Assessment: Patient is a 45yo female admitted for anemia due to menstrual blood  loss, she has received transfusion and Hgb is currently at 7.9. Patient noted to be desaturating without being symptomatic. Found to have PE. Pharmacy consulted for Heparin dosing.  Goal of Therapy:  Heparin level 0.3-0.7 units/ml Monitor platelets by anticoagulation protocol: Yes  7/02 0531 HL 0.58, therapeutic 7/02 1558 HL 0.47, therapeutic  7/03 0536 HL 0.41 7/04 0621 HL 0.39, therapeutic   Plan: . Continue heparin infusion at 1300 units/hr Recheck HL and CBC daily per protocol while on heparin .   Renda Rolls, PharmD, [facility] 02/22/2021 7:08 AM

## 2021-02-22 NOTE — Progress Notes (Signed)
Discharge instructions explained to pt and husband/both verbalized understanding. IV and tele removed. Will transport off unit via wheelchair.

## 2021-02-22 NOTE — Progress Notes (Addendum)
[place] for Xarelto Indication: pulmonary embolus  Patient Measurements: Height: 5\' 5"  (165.1 cm) Weight: 89.7 kg (197 lb 12.8 oz) IBW/kg (Calculated) : 57   Labs: Recent Labs    02/19/21 1914 02/20/21 0531 02/20/21 1558 02/21/21 0536 02/21/21 1636 02/22/21 0621 02/22/21 0848  HGB  --    < > 7.7* 7.6* 7.6* 7.6* 8.1*  HCT  --    < > 22.4* 22.9* 22.5* 23.5* 23.9*  PLT  --    < >  --  470*  --  459* 363  APTT 32  --   --   --   --   --   --   LABPROT 16.3*  --   --   --   --   --   --   INR 1.3*  --   --   --   --   --   --   HEPARINUNFRC  --    < > 0.47 0.41  --  0.39  --   CREATININE  --   --  0.53 0.62  --  0.58  --    < > = values in this interval not displayed.     Estimated Creatinine Clearance: 100.3 mL/min (by C-G formula based on SCr of 0.58 mg/dL).   Medical History: Past Medical History:  Diagnosis Date   Anemia    Dizziness    Fibroid uterus    GERD (gastroesophageal reflux disease)    History of thrombocytosis    History of uterine fibroid 03/14/2014   myomectomy   IBS (irritable bowel syndrome)    Migraine    Polyp of stomach 01/06/2016   Stomach polyp, pyloric, consistent with prolapse.     Assessment: Patient is a 45yo female admitted for anemia due to menstrual blood loss, she has received transfusion and Hgb is currently at 7.9. Patient noted to be desaturating without being symptomatic. Found to have PE. Pharmacy consulted to transition IV heparin to Xarelto.    Plan: . --Stop IV heparin at 1700 today --Upon discontinuation of IV heparin, start Xarelto 15 mg BID x 21 days followed by Xarelto 20 mg daily with supper for remaining duration of therapy --CBC at least every 3 days per protocol  Update: Therapy changed to apixaban by provider. Stop IV heparin. Start apixaban 10 mg BID x 7 days followed by apixaban 5 mg BID for remaining duration of therapy.   02/22/2021 11:17 AM

## 2021-02-22 NOTE — Progress Notes (Signed)
Pt is A&O, VS stable, slight temp tonight, took tylenol once. Heparin drip at 13/hr. JP drain in place. RA. No complaints of pain, complained of discomfort with VS check, but did not want any medications. Husband at bedside. Up to [facility] ad lib. IVs x2 in place. Plan for D/C home today?

## 2021-02-22 NOTE — Progress Notes (Signed)
CSW provided Eliquis coupon at bedside. No other discharge needs identified at this time. Patient to discharge home.   [place], [place]

## 2021-02-22 NOTE — Progress Notes (Addendum)
Patient ID: Sharon Herring, female   DOB: 14-Oct-1975, 45 y.o.   MRN: [id number]      POST-OP NOTE - DAY # 4  Subjective:   The patient does not have complaints.  She is ambulating well. She is taking PO well. Her pain is well controlled with her current medications. She is urinating without difficulty and is passing flatus.  Breathing comfortably.   Objective:  BP 135/89 (BP Location: Left Arm)   Pulse 80   Temp 98.7 F (37.1 C) (Oral)   Resp 17   Ht 5\' 5"  (1.651 m)   Wt 89.7 kg   LMP 02/10/2021 (Exact Date)   SpO2 100%   BMI 32.92 kg/m     Abdomen:                          Abdomen soft and nontender without distention or masses      clean, dry, no drainage   JP drain with < 57mL per 24 hours.   JP removed - lidocaine, sutures cut    Closed with dermabond    Assessment:   Stable from PE - anticoagulated - plan outpt anticoagulation (Dr. Florene Glen)   Surgically well.  Plan:        Discharge to home by Dr. Florene Glen   FU Dr. Marcelline Mates thursday   Today I spent > 50 minutes in the direct care of this patient mostly involved in local anes and erquired surgical removal of drain as well as coordinating care with Dr. Florene Glen.   Finis Bud, M.D. 02/22/2021 12:30 PM

## 2021-02-24 ENCOUNTER — Telehealth: Payer: Self-pay

## 2021-02-24 LAB — TYPE AND SCREEN
ABO/RH(D): A POS
Antibody Screen: POSITIVE
Unit division: 0
Unit division: 0

## 2021-02-24 LAB — BPAM RBC
Blood Product Expiration Date: 202207302359
Blood Product Expiration Date: 202207302359
Unit Type and Rh: 6200
Unit Type and Rh: 6200

## 2021-02-24 NOTE — Telephone Encounter (Signed)
Transition Care Management Follow-up Telephone Call Date of discharge and from where: 02/22/21 [facility] How have you been since you were released from the hospital? Pt states she is doing okay; pain 5/10 post op.  Any questions or concerns? No  Items Reviewed: Did the pt receive and understand the discharge instructions provided? Yes  Medications obtained and verified? Yes  Other? No  Any new allergies since your discharge? No  Dietary orders reviewed? Yes Do you have support at home? Yes   Home Care and Equipment/Supplies: Were home health services ordered? no Were any new equipment or medical supplies ordered?  No  Functional Questionnaire: (I = Independent and D = Dependent) ADLs: I  Bathing/Dressing- I  Meal Prep- I  Eating- I  Maintaining continence- I  Transferring/Ambulation- I  Managing Meds- I  Follow up appointments reviewed:  PCP Hospital f/u appt confirmed? No  provider schedule full; front desk to contact for appt. [facility] f/u appt confirmed? Yes  Scheduled to see Dr. Marcelline Mates on 02/26/21. Are transportation arrangements needed? No  If their condition worsens, is the pt aware to call PCP or go to the Emergency Dept.? Yes Was the patient provided with contact information for the PCP's office or ED? Yes Was to pt encouraged to call back with questions or concerns? Yes

## 2021-02-26 ENCOUNTER — Ambulatory Visit (INDEPENDENT_AMBULATORY_CARE_PROVIDER_SITE_OTHER): Payer: 59 | Admitting: Obstetrics and Gynecology

## 2021-02-26 ENCOUNTER — Encounter: Payer: Self-pay | Admitting: Obstetrics and Gynecology

## 2021-02-26 ENCOUNTER — Other Ambulatory Visit: Payer: Self-pay

## 2021-02-26 VITALS — BP 127/81 | HR 84 | Ht 65.0 in | Wt 193.6 lb

## 2021-02-26 DIAGNOSIS — Z9289 Personal history of other medical treatment: Secondary | ICD-10-CM | POA: Insufficient documentation

## 2021-02-26 DIAGNOSIS — D500 Iron deficiency anemia secondary to blood loss (chronic): Secondary | ICD-10-CM

## 2021-02-26 DIAGNOSIS — I2692 Saddle embolus of pulmonary artery without acute cor pulmonale: Secondary | ICD-10-CM

## 2021-02-26 DIAGNOSIS — Z4889 Encounter for other specified surgical aftercare: Secondary | ICD-10-CM

## 2021-02-26 DIAGNOSIS — Z9889 Other specified postprocedural states: Secondary | ICD-10-CM

## 2021-02-26 DIAGNOSIS — Z86018 Personal history of other benign neoplasm: Secondary | ICD-10-CM

## 2021-02-26 DIAGNOSIS — N920 Excessive and frequent menstruation with regular cycle: Secondary | ICD-10-CM

## 2021-02-26 HISTORY — DX: Personal history of other medical treatment: Z92.89

## 2021-02-26 MED ORDER — LUPRON DEPOT (3-MONTH) 11.25 MG IM KIT: 11.2500 mg | PACK | INTRAMUSCULAR | 1 refills | Status: DC

## 2021-02-26 NOTE — Progress Notes (Signed)
    OBSTETRICS/GYNECOLOGY POST-OPERATIVE CLINIC VISIT  Subjective:     Sharon Herring is a 45 y.o. [id number] female who presents to the clinic 1 weeks status post  robotic myomectomy  and chromotubation for abnormal uterine bleeding, fibroids, and infertility. She was admitted 1 day prior to surgery for severe anemia, requiring blood transfusion of 3 units PRBCs. Eating a regular diet without difficulty. Bowel movements are normal. Pain is controlled with current analgesics. Medications being used: acetaminophen.  Notes mostly incisional pain on left, all others are more itchy.  Of note, patient's post-operative course was complicated by pulmonary embolism. She was treated with a heparin drip and discharged with prescription for Eliquis.  Notes compliance with medication.   The following portions of the patient's history were reviewed and updated as appropriate: allergies, current medications, past family history, past medical history, past social history, past surgical history, and problem list.  Review of Systems Pertinent items noted in HPI and remainder of comprehensive ROS otherwise negative.    Objective:    BP 127/81   Pulse 84   Ht 5\' 5"  (1.651 m)   Wt 193 lb 9.6 oz (87.8 kg)   LMP 02/10/2021 (Exact Date)   BMI 32.22 kg/m  General:  alert and no distress  Abdomen: soft, bowel sounds active, non-tender  Incision:   healing well, no drainage, no erythema, no hernia, no seroma, no swelling, no dehiscence, incision well approximated    Labs:  Lab Results  Component Value Date   WBC 8.6 02/22/2021   HGB 8.1 (L) 02/22/2021   HCT 23.9 (L) 02/22/2021   MCV 70.5 (L) 02/22/2021   PLT 363 02/22/2021    Pathology:   A. UTERUS; MYOMECTOMY:  - LEIOMYOMA(S), FRAGMENTS.   Assessment:   1. Postoperative visit   2. S/P myomectomy   3. Menorrhagia with regular cycle   4. History of uterine fibroid   5. Iron deficiency anemia due to chronic blood loss   6. History of blood  transfusion    Plan:   1. Continue any current medications. Also advised on local lidocaine patches or gel for incisional pain 2. Wound care discussed. 3. Operative findings again reviewed. Pathology report discussed. Discussed that breach of cavity occurred during surgery, and that recommendation would be for Cesarean delivery if pregnancy occurred.  4. Activity restrictions: no lifting more than 10-15 pounds and pelvic rest x 3-4 weeks 5. Anticipated return to work: 1-2 weeks (works from home). 6. Lengthy discussion had regarding plans for fertility. At this time it is advised to wait at least 6 months while on blood thinners.  She also will need further management of her cycles due to significant anemia caused by blood loss. Is not a candidate for most hormonal birth controls. Discussed option of use of Lupron while on blood thinners to help cease cycles. Return to fertility after use is ~ 6-10 weeks.  7. Follow up: will notify patient once Lupron injection arrives and schedule accordingly.     Rubie Maid, MD [facility]

## 2021-02-26 NOTE — Progress Notes (Signed)
Pt present for post op visit. Pt stated that her left side incision is hurting and other ones are sore.

## 2021-03-02 ENCOUNTER — Other Ambulatory Visit: Payer: Self-pay

## 2021-03-02 ENCOUNTER — Encounter: Payer: Self-pay | Admitting: Family Medicine

## 2021-03-02 ENCOUNTER — Ambulatory Visit (INDEPENDENT_AMBULATORY_CARE_PROVIDER_SITE_OTHER): Payer: 59 | Admitting: Family Medicine

## 2021-03-02 VITALS — BP 120/72 | HR 94 | Temp 98.3°F | Resp 16 | Ht 65.0 in | Wt 187.0 lb

## 2021-03-02 DIAGNOSIS — Z09 Encounter for follow-up examination after completed treatment for conditions other than malignant neoplasm: Secondary | ICD-10-CM | POA: Diagnosis not present

## 2021-03-02 DIAGNOSIS — Z9889 Other specified postprocedural states: Secondary | ICD-10-CM | POA: Diagnosis not present

## 2021-03-02 DIAGNOSIS — E441 Mild protein-calorie malnutrition: Secondary | ICD-10-CM

## 2021-03-02 DIAGNOSIS — I2694 Multiple subsegmental pulmonary emboli without acute cor pulmonale: Secondary | ICD-10-CM

## 2021-03-02 DIAGNOSIS — J90 Pleural effusion, not elsewhere classified: Secondary | ICD-10-CM

## 2021-03-02 DIAGNOSIS — Z1231 Encounter for screening mammogram for malignant neoplasm of breast: Secondary | ICD-10-CM

## 2021-03-02 DIAGNOSIS — L231 Allergic contact dermatitis due to adhesives: Secondary | ICD-10-CM

## 2021-03-02 DIAGNOSIS — D62 Acute posthemorrhagic anemia: Secondary | ICD-10-CM | POA: Diagnosis not present

## 2021-03-02 MED ORDER — HYDROCORTISONE 0.5 % EX CREA: 1.0000 "application " | TOPICAL_CREAM | Freq: Two times a day (BID) | CUTANEOUS | 0 refills | Status: DC | PRN

## 2021-03-02 MED ORDER — APIXABAN 5 MG PO TABS: 5.0000 mg | ORAL_TABLET | Freq: Two times a day (BID) | ORAL | 4 refills | Status: DC

## 2021-03-02 MED ORDER — TRIAMCINOLONE ACETONIDE 0.1 % EX CREA: 1.0000 "application " | TOPICAL_CREAM | Freq: Two times a day (BID) | CUTANEOUS | 0 refills | Status: DC

## 2021-03-02 NOTE — Progress Notes (Signed)
Name: Sharon Herring   MRN: [id number]    DOB: 03/21/1976   Date:03/02/2021       Progress Note  Subjective  [facility] Follow Up  HPI  Admitted: 02/17/21 Discharged: 02/22/21  Sharon Herring has a long history of heavy cycles and fibroid tumors. She had an elective myomectomy done by Dr. Marcelline Mates on 78/29/5621 that was complicated but acute onset of SOB secondary to PE, negative for DVT. Also worsening of anemia that required blood transfusion prior to discharge . She states she has been taking Eliquis as prescribed, started 5 mg BID dose yesterday. She has developed some itching on incision sites . She denies fever or chills, has mild left lower abdominal pain. Appetite has been very poor, ate a descent meal last night. She has lost 40 lbs since last year, but states last 10 lbs since hospital stay.   Medication reconciliation was done   Patient Active Problem List   Diagnosis Date Noted   History of blood transfusion 02/26/2021   Acute pulmonary embolism ([facility])    S/P myomectomy 02/19/2021   Anemia associated with acute blood loss 02/17/2021   Symptomatic anemia 10/26/2020   Lightheadedness 04/15/2020   Liver lesion 01/15/2020   Hiatal hernia 01/15/2020   Lymphadenopathy 01/16/2018   Moderate obstructive sleep apnea 12/19/2017   Swelling of limb 01/27/2017   B12 deficiency 03/28/2016   Vitamin D deficiency 03/28/2016   Hyperglycemia 03/28/2016   Multiple gastric polyps 01/08/2016   Menorrhagia 11/13/2015   Chronic constipation 09/23/2015   Obesity, Class I, BMI 30.0-34.9 (see actual BMI) 06/02/2015   Iron deficiency anemia 03/14/2014   History of uterine fibroid 03/14/2014    Past Surgical History:  Procedure Laterality Date   APPENDECTOMY  2012   CHOLECYSTECTOMY     CHROMOPERTUBATION N/A 02/18/2021   Procedure: CHROMOPERTUBATION;  Surgeon: Rubie Maid, MD;  Location: [facility] OR[facility];  Service: Gynecology;  Laterality: N/A;   ESOPHAGOGASTRODUODENOSCOPY (EGD) WITH  PROPOFOL N/A 01/06/2016   Procedure: ESOPHAGOGASTRODUODENOSCOPY (EGD) WITH PROPOFOL;  Surgeon: Robert Bellow, MD;  Location: [facility];  Service: Endoscopy;  Laterality: N/A;   HERNIA REPAIR     ROBOT ASSISTED MYOMECTOMY N/A 02/18/2021   Procedure: XI ROBOTIC ASSISTED MYOMECTOMY;  Surgeon: Rubie Maid, MD;  Location: [facility];  Service: Gynecology;  Laterality: N/A;   ROBOTIC ASSISTED LAPAROSCOPIC CHOLECYSTECTOMY N/A 03/03/2016   Procedure: ROBOTIC ASSISTED LAPAROSCOPIC CHOLECYSTECTOMY ;  Surgeon: Clayburn Pert, MD;  Location: [facility];  Service: General;  Laterality: N/A;   UTERINE FIBROID SURGERY  2012   XI ROBOTIC ASSISTED VENTRAL HERNIA N/A 07/15/2019   Procedure: XI ROBOTIC ASSISTED VENTRAL HERNIA;  Surgeon: Jules Husbands, MD;  Location: [facility];  Service: General;  Laterality: N/A;    Family History  Problem Relation Age of Onset   Hypertension Mother    Hypertension Father    Pancreatic cancer Father 53   Breast cancer Neg Hx    Colon cancer Neg Hx     Social History   Tobacco Use   Smoking status: Never   Smokeless tobacco: Never  Substance Use Topics   Alcohol use: No    Alcohol/week: 0.0 standard drinks     Current Outpatient Medications:    acetaminophen (TYLENOL) 500 MG tablet, Take 2 tablets (1,000 mg total) by mouth every 6 (six) hours., Disp: 30 tablet, Rfl: 0   albuterol (PROVENTIL) (2.5 MG/3ML) 0.083% nebulizer solution, Take 3 mLs (2.5 mg total) by nebulization every 6 (six) hours as needed for  wheezing or shortness of breath., Disp: 150 mL, Rfl: 1   albuterol (VENTOLIN HFA) 108 (90 Base) MCG/ACT inhaler, Inhale 2 puffs into the lungs every 6 (six) hours as needed for wheezing or shortness of breath., Disp: 18 g, Rfl: 0   apixaban (ELIQUIS) 5 MG TABS tablet, Take 1 tablet (5 mg total) by mouth 2 (two) times daily., Disp: 60 tablet, Rfl: 4   docusate sodium (COLACE) 100 MG capsule, Take 1 capsule (100 mg total) by mouth 2 (two) times daily., Disp:  10 capsule, Rfl: 0   ferrous sulfate (FERROUSUL) 325 (65 FE) MG tablet, Take 1 tablet (325 mg total) by mouth 3 (three) times daily with meals., Disp: 90 tablet, Rfl: 1   fluticasone (FLONASE) 50 MCG/ACT nasal spray, Place 2 sprays into both nostrils daily. (Patient taking differently: Place 2 sprays into both nostrils daily as needed for allergies.), Disp: 16 g, Rfl: 6   furosemide (LASIX) 20 MG tablet, Take 1 tablet (20 mg total) by mouth daily. (Patient taking differently: Take 20 mg by mouth daily as needed for fluid.), Disp: 30 tablet, Rfl: 1   HYDROmorphone (DILAUDID) 2 MG tablet, Take 1 tablet (2 mg total) by mouth every 4 (four) hours as needed for moderate pain or severe pain (may take up to 2 tablets every 6 hours for severe pain)., Disp: 20 tablet, Rfl: 0   leuprolide (LUPRON DEPOT, 11-MONTH,) 11.25 MG injection, Inject 11.25 mg into the muscle every 3 (three) months., Disp: 1 each, Rfl: 1   linaclotide (LINZESS) 290 MCG CAPS capsule, Take 1 capsule (290 mcg total) by mouth daily before breakfast., Disp: 30 capsule, Rfl: 2   Multiple Vitamins-Minerals (MULTIVITAMIN ADULT) TABS, Take 1 tablet by mouth daily., Disp: 30 tablet, Rfl: 0   pantoprazole (PROTONIX) 40 MG tablet, Take 1 tablet (40 mg total) by mouth daily., Disp: 30 tablet, Rfl: 0   simethicone (MYLICON) 80 MG chewable tablet, Chew 1 tablet (80 mg total) by mouth 4 (four) times daily as needed for flatulence., Disp: 30 tablet, Rfl: 0   triamcinolone cream (KENALOG) 0.1 %, Apply 1 application topically 2 (two) times daily., Disp: 45 g, Rfl: 0  Allergies  Allergen Reactions   Aspirin Nausea And Vomiting   Oxycodone-Acetaminophen Rash and Shortness Of Breath   Lexapro [Escitalopram] Palpitations   Tape Other (See Comments)    Burning feeling. Has left mark on skin.   Belviq [Lorcaserin Hcl] Other (See Comments)    headache   Dicyclomine Nausea And Vomiting   Contrave [Naltrexone-Bupropion Hcl Er] Anxiety    Altered mental state    Norethindrone Rash    I personally reviewed active problem list, medication list, allergies, family history, social history, health maintenance with the patient/caregiver today.   ROS  Constitutional: Negative for fever, positive for  weight change.  Respiratory: Negative for cough and shortness of breath.   Cardiovascular: Negative for chest pain or palpitations.  Gastrointestinal: positive  for mild abdominal pain, no bowel changes.  Musculoskeletal: Negative for gait problem or joint swelling.  Skin: Negative for rash.  Neurological: Negative for dizziness or headache.  No other specific complaints in a complete review of systems (except as listed in HPI above).    Objective  Vitals:   03/02/21 1442  BP: 120/72  Pulse: 94  Resp: 16  Temp: 98.3 F (36.8 C)  SpO2: 99%  Weight: 187 lb (84.8 kg)  Height: 5\' 5"  (1.651 m)    Body mass index is 31.12 kg/m.  Physical  Exam  Constitutional: Patient appears well-developed and malnourished, lost 40 lbs int he past year, but 10 lbs since surgery. No distress.  HEENT: head atraumatic, normocephalic, pupils equal and reactive to light, ears neck supple Cardiovascular: Normal rate, regular rhythm and normal heart sounds.  No murmur heard. No BLE edema. Pulmonary/Chest: Effort normal and breath sounds normal. No respiratory distress. Abdominal: Soft.  There is mild abdominal/pelvic tenderness, no guarding or rebound tenderness, incision sites seems to be showing keloid formation, small bumps on area of glue  Psychiatric: Patient has a normal mood and affect. behavior is normal. Judgment and thought content normal.   Recent Results (from the past 2160 hour(s))  POCT urinalysis dipstick     Status: None   Collection Time: 01/13/21  2:59 PM  Result Value Ref Range   Color, UA auburn    Clarity, UA cloudy    Glucose, UA Negative Negative   Bilirubin, UA neg    Ketones, UA neg    Spec Grav, UA 1.020 1.010 - 1.025   Blood, UA  large    pH, UA 6.0 5.0 - 8.0   Protein, UA Negative Negative   Urobilinogen, UA 0.2 0.2 or 1.0 E.U./dL   Nitrite, UA neg    Leukocytes, UA Negative Negative   Appearance auburn;cloudy    Odor    FSH/LH     Status: None   Collection Time: 01/13/21  3:52 PM  Result Value Ref Range   LH 4.7 mIU/mL    Comment:                     Adult Female:                       Follicular phase      2.4 -  12.6                       Ovulation phase      14.0 -  95.6                       Luteal phase          1.0 -  11.4                       Postmenopausal        7.7 -  58.5    FSH 4.7 mIU/mL    Comment:                     Adult Female:                       Follicular phase      3.5 -  12.5                       Ovulation phase       4.7 -  21.5                       Luteal phase          1.7 -   7.7                       Postmenopausal       25.8 - 134.8   Estradiol     Status: None   Collection Time:  01/13/21  3:52 PM  Result Value Ref Range   Estradiol 20.3 pg/mL    Comment:                     Adult Female:                       Follicular phase   25.6 -   166.0                       Ovulation phase    85.8 -   498.0                       Luteal phase       43.8 -   211.0                       Postmenopausal     <6.0 -    54.7                     Pregnancy                       1st trimester     215.0 - >4300.0 Roche ECLIA methodology   Anti mullerian hormone     Status: None   Collection Time: 01/13/21  3:52 PM  Result Value Ref Range   ANTI-MULLERIAN HORMONE (AMH) 0.396 ng/mL    Comment: For assays employing antibodies, the possibility exists for interference by heterophile antibodies in the samples.1  1.Kricka L.  Interferences in Immunoassays - still a threat.  Clin. Chem. 2000; 46: 3893-7342.  This test was developed and its performance characteristics determined by [facility]. It has not been cleared or approved by the Food and Drug Administration. Reference  Range: Females 41 - 46y: 0.26 - 5.81 Median 0.58 AMH concentrations of >= 1.06 ng/mL is correlated with a better response to ovarian stimulation, produced more retrievable oocytes and higher odds of live birth according to Gleicher et al.  Buelah Manis and Sterility. 2010: 87:6811-5726.  The current AMH test method correlates with the study method with a slope of 0.94. Females at risk of ovarian hyperstimulation syndrome or polycystic ovarian syndrome (PCOS) may exhibit elevated serum AMH concentrations.   AMH levels from PCOS patients may be 2 to 5 fold higher than age-appropriate reference int erval values. Granulosa cell tumors of the ovary may secrete AMH along with other tumor markers.  Elevated AMH is not specific for malignancy, and the assay should not be used exclusively to diagnose or exclude an AMH-secreting ovarian tumor.   TSH     Status: None   Collection Time: 01/13/21  3:52 PM  Result Value Ref Range   TSH 2.210 0.450 - 4.500 uIU/mL  Progesterone     Status: None   Collection Time: 01/13/21  3:52 PM  Result Value Ref Range   Progesterone 0.5 ng/mL    Comment:                      Follicular phase       0.1 -   0.9                      Luteal phase           1.8 -  23.9  Ovulation phase        0.1 -  12.0                      Pregnant                         First trimester    11.0 -  44.3                         Second trimester   25.4 -  83.3                         Third trimester    58.7 - 214.0                      Postmenopausal         0.0 -   0.1   NuSwab Vaginitis Plus (VG+)     Status: Abnormal   Collection Time: 01/13/21  4:45 PM  Result Value Ref Range   Atopobium vaginae Moderate - 1 Score   BVAB 2 High - 2 (A) Score   Megasphaera 1 High - 2 (A) Score    Comment: Calculate total score by adding the 3 individual bacterial vaginosis (BV) marker scores together.  Total score is interpreted as follows: Total score 0-1: Indicates  the absence of BV. Total score   2: Indeterminate for BV. Additional clinical                  data should be evaluated to establish a                  diagnosis. Total score 3-6: Indicates the presence of BV. This test was developed and its performance characteristics determined by [facility].  It has not been cleared or approved by the Food and Drug Administration.    Candida albicans, NAA Positive (A) Negative   Candida glabrata, NAA Negative Negative   Trich vag by NAA Negative Negative   Chlamydia trachomatis, NAA Negative Negative   Neisseria gonorrhoeae, NAA Negative Negative  SARS CORONAVIRUS 2 (TAT 6-24 HRS) Nasopharyngeal Nasopharyngeal Swab     Status: None   Collection Time: 02/16/21  8:25 AM   Specimen: Nasopharyngeal Swab  Result Value Ref Range   SARS Coronavirus 2 NEGATIVE NEGATIVE    Comment: (NOTE) SARS-CoV-2 target nucleic acids are NOT DETECTED.  The SARS-CoV-2 RNA is generally detectable in upper and lower respiratory specimens during the acute phase of infection. Negative results do not preclude SARS-CoV-2 infection, do not rule out co-infections with other pathogens, and should not be used as the sole basis for treatment or other patient management decisions. Negative results must be combined with clinical observations, patient history, and epidemiological information. The expected result is Negative.  Fact Sheet for Patients: SugarRoll.be  Fact Sheet for Healthcare Providers: https://www.woods-mathews.com/  This test is not yet approved or cleared by the [place] FDA and  has been authorized for detection and/or diagnosis of SARS-CoV-2 by FDA under an Emergency Use Authorization (EUA). This EUA will remain  in effect (meaning this test can be used) for the duration of the COVID-19 declaration under Se ction 564(b)(1) of the Act, 21 U.S.C. section 360bbb-3(b)(1), unless the authorization is terminated  or revoked sooner.  Performed at [facility], [place] [address], [place],  [address]   CBC  Status: Abnormal   Collection Time: 02/16/21  8:33 AM  Result Value Ref Range   WBC 5.1 4.0 - 10.5 K/uL   RBC 3.51 (L) 3.87 - 5.11 MIL/uL   Hemoglobin 5.9 (L) 12.0 - 15.0 g/dL    Comment: Reticulocyte Hemoglobin testing may be clinically indicated, consider ordering this additional test [id number]    HCT 21.2 (L) 36.0 - 46.0 %   MCV 60.4 (L) 80.0 - 100.0 fL   MCH 16.8 (L) 26.0 - 34.0 pg   MCHC 27.8 (L) 30.0 - 36.0 g/dL   RDW 25.6 (H) 11.5 - 15.5 %   Platelets 1,096 (HH) 150 - 400 K/uL    Comment: SPECIMEN CHECKED FOR CLOTS REPEATED TO VERIFY PLATELET COUNT CONFIRMED BY SMEAR PLATELETS APPEAR INCREASED CRITICAL RESULT CALLED TO, READ BACK BY AND VERIFIED WITH: Laury Deep, RN 1050 02/16/21 GAA    nRBC 0.4 (H) 0.0 - 0.2 %    Comment: Performed at [facility], [address] [place], [place] [address]  Type and screen     Status: None   Collection Time: 02/16/21  8:33 AM  Result Value Ref Range   ABO/RH(D) A POS    Antibody Screen POS    Sample Expiration 02/20/2021,2359    Extend sample reason NO TRANSFUSIONS OR PREGNANCY IN THE PAST 3 MONTHS    Antibody Identification ANTI M ANTI K    PT AG Type      NEGATIVE FOR KELL ANTIGEN Performed at [facility], [address], [place], [place] [address]    Unit Number [id number]    Blood Component Type RED CELLS,LR    Unit division 00    Status of Unit ISSUED,FINAL    Transfusion Status OK TO TRANSFUSE    Crossmatch Result COMPATIBLE    Donor AG Type NEGATIVE FOR KELL ANTIGEN    Unit Number [id number]    Blood Component Type RED CELLS,LR    Unit division 00    Status of Unit ISSUED,FINAL    Transfusion Status OK TO TRANSFUSE    Crossmatch Result COMPATIBLE    Donor AG Type NEGATIVE FOR KELL ANTIGEN    Unit Number [id number]    Blood Component Type RED CELLS,LR    Unit division 00     Status of Unit ISSUED,FINAL    Transfusion Status OK TO TRANSFUSE    Crossmatch Result COMPATIBLE    Donor AG Type NEGATIVE FOR KELL ANTIGEN    Unit Number [id number]    Blood Component Type RED CELLS,LR    Unit division 00    Status of Unit REL FROM [facility]    Transfusion Status OK TO TRANSFUSE    Crossmatch Result COMPATIBLE    Unit Number [id number]    Blood Component Type RED CELLS,LR    Unit division 00    Status of Unit ISSUED,FINAL    Transfusion Status OK TO TRANSFUSE    Crossmatch Result COMPATIBLE    Donor AG Type NEGATIVE FOR KELL ANTIGEN   Comprehensive metabolic panel     Status: Abnormal   Collection Time: 02/16/21  8:33 AM  Result Value Ref Range   Sodium 139 135 - 145 mmol/L   Potassium 3.7 3.5 - 5.1 mmol/L   Chloride 108 98 - 111 mmol/L   CO2 27 22 - 32 mmol/L   Glucose, Bld 98 70 - 99 mg/dL    Comment: Glucose reference range applies only to samples taken after fasting for at least 8 hours.   BUN  10 6 - 20 mg/dL   Creatinine, Ser 0.67 0.44 - 1.00 mg/dL   Calcium 8.8 (L) 8.9 - 10.3 mg/dL   Total Protein 7.4 6.5 - 8.1 g/dL   Albumin 3.6 3.5 - 5.0 g/dL   AST 16 15 - 41 U/L   ALT 9 0 - 44 U/L   Alkaline Phosphatase 62 38 - 126 U/L   Total Bilirubin 0.3 0.3 - 1.2 mg/dL   GFR, Estimated >60 >60 mL/min    Comment: (NOTE) Calculated using the CKD-EPI Creatinine Equation (2021)    Anion gap 4 (L) 5 - 15    Comment: Performed at [facility], [address] [place], [place] [address]  Pathologist smear review     Status: None   Collection Time: 02/16/21  8:33 AM  Result Value Ref Range   Path Review      Blood smear is reviewed, specimen from February 16, 2021    Comment: There is marked thrombocytosis with giant platelets. There is severe microcytic anemia with a rare nucleated RBC. Leukocytes are unremarkable. I note the history of iron deficiency anemia with previous transfusion, now with alloantibodies including anti Kell. The thrombocytosis may  be due to iron deficiency, but careful followup is indicated to confirm a decrease in the platelet count after  iron replenishment, in order to exclude a myeloproliferative neoplasm. Reviewed by Lemmie Evens. Dicie Beam, MD. Performed at [facility], [address] [place], [place] [address]   BPAM RBC     Status: None   Collection Time: 02/16/21  8:33 AM  Result Value Ref Range   ISSUE DATE / TIME 191478295621    Blood Product Unit Number [id number]    PRODUCT CODE [id number]    Unit Type and Rh 4010    Blood Product Expiration Date 272536644034    ISSUE DATE / TIME 742595638756    Blood Product Unit Number [id number]    PRODUCT CODE [id number]    Unit Type and Rh 0932    Blood Product Expiration Date 355732202542    ISSUE DATE / TIME 706237628315    Blood Product Unit Number [id number]    PRODUCT CODE I9485I62    Unit Type and Rh 7035    Blood Product Expiration Date 009381829937    Blood Product Unit Number [id number]    PRODUCT CODE P1025E52    Unit Type and Rh 6200    Blood Product Expiration Date 778242353614    ISSUE DATE / TIME 431540086761    Blood Product Unit Number [id number]    PRODUCT CODE [id number]    Unit Type and Rh 6200    Blood Product Expiration Date 505397673419   Prepare RBC (crossmatch)     Status: None   Collection Time: 02/17/21  1:10 PM  Result Value Ref Range   Order Confirmation      ORDER PROCESSED BY BLOOD BANK Performed at [facility], [address] [place], [place] [address]   Prepare RBC (crossmatch)     Status: None   Collection Time: 02/17/21  6:19 PM  Result Value Ref Range   Order Confirmation      ORDER PROCESSED BY BLOOD BANK Performed at [facility], [address] [place], [place] [address]   Hemoglobin and hematocrit, blood     Status: Abnormal   Collection Time: 02/18/21  2:01 AM  Result Value Ref Range   Hemoglobin 9.2 (L) 12.0 - 15.0 g/dL   HCT 25.0 (L) 36.0 - 46.0 %  Comment:  Performed at [facility], [address] [place], [place] [address]  Pregnancy, urine     Status: None   Collection Time: 02/18/21  3:20 AM  Result Value Ref Range   Preg Test, Ur NEGATIVE NEGATIVE    Comment: Performed at [facility], [address] [place], [place] [address]  Surgical pathology     Status: None   Collection Time: 02/18/21  9:35 AM  Result Value Ref Range   SURGICAL PATHOLOGY      SURGICAL PATHOLOGY CASE: [id number] PATIENT: [facility] Report     Specimen Submitted: A. Uterine fibroid  Clinical History: Fibroid uterus, menorrhagia     DIAGNOSIS: A. UTERUS; MYOMECTOMY: - LEIOMYOMA(S), FRAGMENTS.  GROSS DESCRIPTION: A. Labeled: Uterine fibroid Received: Formalin Collection time: 12:38 PM on 02/18/2021 Placed into formalin time: 1 PM on 02/18/2021 Tissue fragment(s): Multiple Size: Aggregate, 9.7 x 8.5 x 4.1 cm Description: Received are fragments of tan-white and pink soft tissue. The larger fragments are serially sectioned revealing tan-white, whorled, and firm cut surfaces. Representative sections (1/cm) are submitted in cassettes 1-5.  RB 02/18/2021  Final Diagnosis performed by Bryan Lemma, MD.   Electronically signed 02/19/2021 7:06:54PM The electronic signature indicates that the named Attending Pathologist has evaluated the specimen Technical component performed at [facility], [address], [place],  [place] [address] Lab: [phone] Dir: Rush Farmer, MD, MMM  Professional component performed at [facility], [facility], [place], [place], [place] [address] Lab: [phone] Dir: Dellia Nims. Rubinas, MD   CBC with Differential/Platelet     Status: Abnormal   Collection Time: 02/19/21  7:06 AM  Result Value Ref Range   WBC 17.2 (H) 4.0 - 10.5 K/uL   RBC 3.37 (L) 3.87 - 5.11 MIL/uL   Hemoglobin 7.8 (L) 12.0 - 15.0 g/dL    Comment: Reticulocyte Hemoglobin testing may be  clinically indicated, consider ordering this additional test [id number]    HCT 22.6 (L) 36.0 - 46.0 %   MCV 67.1 (L) 80.0 - 100.0 fL   MCH 23.1 (L) 26.0 - 34.0 pg   MCHC 34.5 30.0 - 36.0 g/dL    Comment: CORRECTED FOR COLD AGGLUTININS   RDW 29.4 (H) 11.5 - 15.5 %   Platelets 649 (H) 150 - 400 K/uL   nRBC 0.0 0.0 - 0.2 %   Neutrophils Relative % 86 %   Neutro Abs 14.9 (H) 1.7 - 7.7 K/uL   Lymphocytes Relative 7 %   Lymphs Abs 1.1 0.7 - 4.0 K/uL   Monocytes Relative 5 %   Monocytes Absolute 0.9 0.1 - 1.0 K/uL   Eosinophils Relative 1 %   Eosinophils Absolute 0.1 0.0 - 0.5 K/uL   Basophils Relative 0 %   Basophils Absolute 0.0 0.0 - 0.1 K/uL   WBC Morphology MORPHOLOGY UNREMARKABLE    RBC Morphology MIXED RBC POPULATION    Smear Review PLATELET CLUMPS NOTED ON SMEAR, UNABLE TO ESTIMATE    Immature Granulocytes 1 %   Abs Immature Granulocytes 0.09 (H) 0.00 - 0.07 K/uL    Comment: Performed at [facility], [address] [place],  [address]  CBC     Status: Abnormal   Collection Time: 02/19/21 11:55 AM  Result Value Ref Range   WBC 17.1 (H) 4.0 - 10.5 K/uL   RBC 3.35 (L) 3.87 - 5.11 MIL/uL   Hemoglobin 7.9 (L) 12.0 - 15.0 g/dL    Comment: Reticulocyte Hemoglobin testing may be clinically indicated, consider  ordering this additional test [id number]    HCT 22.9 (L) 36.0 - 46.0 %   MCV 68.4 (L) 80.0 - 100.0 fL   MCH 23.6 (L) 26.0 - 34.0 pg   MCHC 34.5 30.0 - 36.0 g/dL    Comment: CORRECTED FOR COLD AGGLUTININS   RDW 29.5 (H) 11.5 - 15.5 %   Platelets 358 150 - 400 K/uL   nRBC 0.1 0.0 - 0.2 %    Comment: Performed at [facility], [address] [place], [place] [address]  Comprehensive metabolic panel     Status: Abnormal   Collection Time: 02/19/21 11:55 AM  Result Value Ref Range   Sodium 135 135 - 145 mmol/L   Potassium 3.9 3.5 - 5.1 mmol/L   Chloride 103 98 - 111 mmol/L   CO2 25 22 - 32 mmol/L   Glucose, Bld 115 (H) 70 - 99 mg/dL     Comment: Glucose reference range applies only to samples taken after fasting for at least 8 hours.   BUN 9 6 - 20 mg/dL   Creatinine, Ser 0.75 0.44 - 1.00 mg/dL   Calcium 8.2 (L) 8.9 - 10.3 mg/dL   Total Protein 6.3 (L) 6.5 - 8.1 g/dL   Albumin 3.0 (L) 3.5 - 5.0 g/dL   AST 19 15 - 41 U/L   ALT 9 0 - 44 U/L   Alkaline Phosphatase 47 38 - 126 U/L   Total Bilirubin 0.6 0.3 - 1.2 mg/dL   GFR, Estimated >60 >60 mL/min    Comment: (NOTE) Calculated using the CKD-EPI Creatinine Equation (2021)    Anion gap 7 5 - 15    Comment: Performed at [facility], [address] [place], [place] [address]  D-dimer, quantitative     Status: Abnormal   Collection Time: 02/19/21  6:39 PM  Result Value Ref Range   D-Dimer, Quant 18.22 (H) 0.00 - 0.50 ug/mL-FEU    Comment: (NOTE) At the manufacturer cut-off value of 0.5 g/mL FEU, this assay has a negative predictive value of 95-100%.This assay is intended for use in conjunction with a clinical pretest probability (PTP) assessment model to exclude pulmonary embolism (PE) and deep venous thrombosis (DVT) in outpatients suspected of PE or DVT. Results should be correlated with clinical presentation. Performed at [facility], [address] [place], [place] [address]   APTT     Status: None   Collection Time: 02/19/21  7:14 PM  Result Value Ref Range   aPTT 32 24 - 36 seconds    Comment: Performed at [facility], [address] [place], [place] [address]  Protime-INR     Status: Abnormal   Collection Time: 02/19/21  7:14 PM  Result Value Ref Range   Prothrombin Time 16.3 (H) 11.4 - 15.2 seconds   INR 1.3 (H) 0.8 - 1.2    Comment: (NOTE) INR goal varies based on device and disease states. Performed at [facility], [address] [place], [place] [address]   CBC     Status: Abnormal   Collection Time: 02/20/21  5:31 AM  Result Value Ref Range   WBC 15.5 (H) 4.0 - 10.5 K/uL   RBC 3.12 (L) 3.87 - 5.11  MIL/uL   Hemoglobin 7.0 (L) 12.0 - 15.0 g/dL    Comment: Reticulocyte Hemoglobin testing may be clinically indicated, consider ordering this additional test [id number]    HCT 21.1 (L) 36.0 - 46.0 %   MCV 67.6 (L) 80.0 - 100.0 fL  MCH 22.4 (L) 26.0 - 34.0 pg   MCHC 33.2 30.0 - 36.0 g/dL   RDW 29.7 (H) 11.5 - 15.5 %   Platelets 355 150 - 400 K/uL   nRBC 0.1 0.0 - 0.2 %    Comment: Performed at [facility], [place], [place] [address]  Heparin level (unfractionated)     Status: None   Collection Time: 02/20/21  5:31 AM  Result Value Ref Range   Heparin Unfractionated 0.58 0.30 - 0.70 IU/mL    Comment: (NOTE) The clinical reportable range upper limit is being lowered to >1.10 to align with the FDA approved guidance for the current laboratory assay.  If heparin results are below expected values, and patient dosage has  been confirmed, suggest follow up testing of antithrombin III levels. Performed at [facility], [address] [place], [place] [address]   Prepare RBC (crossmatch)     Status: None   Collection Time: 02/20/21  7:56 AM  Result Value Ref Range   Order Confirmation      ORDER PROCESSED BY BLOOD BANK Performed at [facility], [address] [place], [place] [address]   ECHOCARDIOGRAM COMPLETE     Status: None   Collection Time: 02/20/21 12:24 PM  Result Value Ref Range   Weight 3,164.8 oz   Height 65 in   BP 114/78 mmHg   Ao pk vel 1.38 m/s   AR max vel 2.94 cm2   AV Peak grad 7.6 mmHg   S' Lateral 2.98 cm   Area-P 1/2 5.54 cm2  Comprehensive metabolic panel     Status: Abnormal   Collection Time: 02/20/21  3:58 PM  Result Value Ref Range   Sodium 137 135 - 145 mmol/L   Potassium 3.6 3.5 - 5.1 mmol/L   Chloride 105 98 - 111 mmol/L   CO2 28 22 - 32 mmol/L   Glucose, Bld 91 70 - 99 mg/dL    Comment: Glucose reference range applies only to samples taken after fasting for at least 8 hours.   BUN 9 6 - 20 mg/dL    Creatinine, Ser 0.53 0.44 - 1.00 mg/dL   Calcium 8.2 (L) 8.9 - 10.3 mg/dL   Total Protein 6.0 (L) 6.5 - 8.1 g/dL   Albumin 2.8 (L) 3.5 - 5.0 g/dL   AST 12 (L) 15 - 41 U/L   ALT 9 0 - 44 U/L   Alkaline Phosphatase 44 38 - 126 U/L   Total Bilirubin 0.6 0.3 - 1.2 mg/dL   GFR, Estimated >60 >60 mL/min    Comment: (NOTE) Calculated using the CKD-EPI Creatinine Equation (2021)    Anion gap 4 (L) 5 - 15    Comment: Performed at [facility], [address] [place], [place] [address]  Hemoglobin and hematocrit, blood     Status: Abnormal   Collection Time: 02/20/21  3:58 PM  Result Value Ref Range   Hemoglobin 7.7 (L) 12.0 - 15.0 g/dL   HCT 22.4 (L) 36.0 - 46.0 %    Comment: RESULT CALLED TO, READ BACK BY AND VERIFIED WITH: JULIESA,RN @1642  ON 07.02.22.SH Performed at [facility], [address] [place], [place] [address] CORRECTED ON 07/02 AT 1703: PREVIOUSLY REPORTED AS 17.7   Type and screen Colp     Status: None   Collection Time: 02/20/21  3:58 PM  Result Value Ref Range   ABO/RH(D) A POS    Antibody Screen POS  Sample Expiration 02/23/2021,2359    Antibody Identification ANTI K    Unit Number [id number]    Blood Component Type RED CELLS,LR    Unit division 00    Status of Unit REL FROM [facility]    Transfusion Status OK TO TRANSFUSE    Crossmatch Result COMPATIBLE    Unit Number [id number]    Blood Component Type RED CELLS,LR    Unit division 00    Status of Unit REL FROM [facility]    Transfusion Status OK TO TRANSFUSE    Crossmatch Result COMPATIBLE   Heparin level (unfractionated)     Status: None   Collection Time: 02/20/21  3:58 PM  Result Value Ref Range   Heparin Unfractionated 0.47 0.30 - 0.70 IU/mL    Comment: (NOTE) The clinical reportable range upper limit is being lowered to >1.10 to align with the FDA approved guidance for the current laboratory assay.  If heparin results are below expected values, and  patient dosage has  been confirmed, suggest follow up testing of antithrombin III levels. Performed at [facility], [place], [place] [address]   BPAM RBC     Status: None   Collection Time: 02/20/21  3:58 PM  Result Value Ref Range   Blood Product Unit Number [id number]    PRODUCT CODE [id number]    Unit Type and Rh 6200    Blood Product Expiration Date 081448185631    Blood Product Unit Number [id number]    PRODUCT CODE [id number]    Unit Type and Rh 6200    Blood Product Expiration Date 786767209470   Vitamin B12     Status: Abnormal   Collection Time: 02/20/21  4:03 PM  Result Value Ref Range   Vitamin B-12 178 (L) 180 - 914 pg/mL    Comment: (NOTE) This assay is not validated for testing neonatal or myeloproliferative syndrome specimens for Vitamin B12 levels. Performed at [facility], [place] [place], [place] [address]   CBC with Differential/Platelet     Status: Abnormal   Collection Time: 02/21/21  5:36 AM  Result Value Ref Range   WBC 12.5 (H) 4.0 - 10.5 K/uL   RBC 3.30 (L) 3.87 - 5.11 MIL/uL   Hemoglobin 7.6 (L) 12.0 - 15.0 g/dL    Comment: Reticulocyte Hemoglobin testing may be clinically indicated, consider ordering this additional test [id number]    HCT 22.9 (L) 36.0 - 46.0 %   MCV 69.4 (L) 80.0 - 100.0 fL   MCH 23.0 (L) 26.0 - 34.0 pg   MCHC 33.2 30.0 - 36.0 g/dL   RDW 29.8 (H) 11.5 - 15.5 %   Platelets 470 (H) 150 - 400 K/uL   nRBC 0.2 0.0 - 0.2 %   Neutrophils Relative % 67 %   Neutro Abs 8.4 (H) 1.7 - 7.7 K/uL   Lymphocytes Relative 23 %   Lymphs Abs 2.9 0.7 - 4.0 K/uL   Monocytes Relative 6 %   Monocytes Absolute 0.7 0.1 - 1.0 K/uL   Eosinophils Relative 3 %   Eosinophils Absolute 0.4 0.0 - 0.5 K/uL   Basophils Relative 0 %   Basophils Absolute 0.0 0.0 - 0.1 K/uL   WBC Morphology MORPHOLOGY UNREMARKABLE    RBC Morphology MIXED RBC MORPHOLOGY PRESENT    Smear Review Normal platelet morphology     Immature Granulocytes 1 %   Abs Immature Granulocytes 0.13 (H) 0.00 - 0.07 K/uL   Polychromasia PRESENT     Comment:  Performed at [facility], [address] [place], [place] [address]  Comprehensive metabolic panel     Status: Abnormal   Collection Time: 02/21/21  5:36 AM  Result Value Ref Range   Sodium 139 135 - 145 mmol/L   Potassium 3.7 3.5 - 5.1 mmol/L   Chloride 104 98 - 111 mmol/L   CO2 29 22 - 32 mmol/L   Glucose, Bld 91 70 - 99 mg/dL    Comment: Glucose reference range applies only to samples taken after fasting for at least 8 hours.   BUN 10 6 - 20 mg/dL   Creatinine, Ser 0.62 0.44 - 1.00 mg/dL   Calcium 8.4 (L) 8.9 - 10.3 mg/dL   Total Protein 6.0 (L) 6.5 - 8.1 g/dL   Albumin 2.7 (L) 3.5 - 5.0 g/dL   AST 13 (L) 15 - 41 U/L   ALT 9 0 - 44 U/L   Alkaline Phosphatase 45 38 - 126 U/L   Total Bilirubin 0.5 0.3 - 1.2 mg/dL   GFR, Estimated >60 >60 mL/min    Comment: (NOTE) Calculated using the CKD-EPI Creatinine Equation (2021)    Anion gap 6 5 - 15    Comment: Performed at [facility], [address] [place], [place] [address]  Magnesium     Status: None   Collection Time: 02/21/21  5:36 AM  Result Value Ref Range   Magnesium 1.8 1.7 - 2.4 mg/dL    Comment: Performed at [facility], [address], [place], [place] [address]  Phosphorus     Status: None   Collection Time: 02/21/21  5:36 AM  Result Value Ref Range   Phosphorus 3.2 2.5 - 4.6 mg/dL    Comment: Performed at [facility], [place], [place] [address]  Heparin level (unfractionated)     Status: None   Collection Time: 02/21/21  5:36 AM  Result Value Ref Range   Heparin Unfractionated 0.41 0.30 - 0.70 IU/mL    Comment: (NOTE) The clinical reportable range upper limit is being lowered to >1.10 to align with the FDA approved guidance for the current laboratory assay.  If heparin results are below expected values, and patient dosage has  been  confirmed, suggest follow up testing of antithrombin III levels. Performed at [facility], [address] [place], [place] [address]   Iron and TIBC     Status: Abnormal   Collection Time: 02/21/21  5:36 AM  Result Value Ref Range   Iron 26 (L) 28 - 170 ug/dL   TIBC 319 250 - 450 ug/dL   Saturation Ratios 8 (L) 10.4 - 31.8 %   UIBC 293 ug/dL    Comment: Performed at [facility], [address] [place], [place] [address]  Ferritin     Status: None   Collection Time: 02/21/21  5:36 AM  Result Value Ref Range   Ferritin 31 11 - 307 ng/mL    Comment: Performed at [facility], [address] [place],  [address]  Folate     Status: None   Collection Time: 02/21/21  5:36 AM  Result Value Ref Range   Folate 7.4 >5.9 ng/mL    Comment: Performed at [facility], [address] [place],  [address]  Hemoglobin and hematocrit, blood     Status: Abnormal   Collection Time: 02/21/21  4:36 PM  Result Value Ref Range   Hemoglobin 7.6 (L) 12.0 - 15.0 g/dL  HCT 22.5 (L) 36.0 - 46.0 %    Comment: Performed at [facility], [place], [place] [address]  Heparin level (unfractionated)     Status: None   Collection Time: 02/22/21  6:21 AM  Result Value Ref Range   Heparin Unfractionated 0.39 0.30 - 0.70 IU/mL    Comment: (NOTE) The clinical reportable range upper limit is being lowered to >1.10 to align with the FDA approved guidance for the current laboratory assay.  If heparin results are below expected values, and patient dosage has  been confirmed, suggest follow up testing of antithrombin III levels. Performed at [facility] [address] [place], [place] [address]   CBC with Differential/Platelet     Status: Abnormal   Collection Time: 02/22/21  6:21 AM  Result Value Ref Range   WBC 8.9 4.0 - 10.5 K/uL   RBC 3.41 (L) 3.87 - 5.11 MIL/uL   Hemoglobin 7.6 (L) 12.0 - 15.0 g/dL    Comment:  Reticulocyte Hemoglobin testing may be clinically indicated, consider ordering this additional test [id number]    HCT 23.5 (L) 36.0 - 46.0 %   MCV 68.9 (L) 80.0 - 100.0 fL   MCH 22.3 (L) 26.0 - 34.0 pg   MCHC 32.3 30.0 - 36.0 g/dL    Comment: CORRECTED FOR COLD AGGLUTININS   RDW 29.6 (H) 11.5 - 15.5 %   Platelets 459 (H) 150 - 400 K/uL   nRBC 0.6 (H) 0.0 - 0.2 %   Neutrophils Relative % 65 %   Neutro Abs 5.8 1.7 - 7.7 K/uL   Lymphocytes Relative 22 %   Lymphs Abs 1.9 0.7 - 4.0 K/uL   Monocytes Relative 8 %   Monocytes Absolute 0.7 0.1 - 1.0 K/uL   Eosinophils Relative 3 %   Eosinophils Absolute 0.2 0.0 - 0.5 K/uL   Basophils Relative 0 %   Basophils Absolute 0.0 0.0 - 0.1 K/uL   WBC Morphology MORPHOLOGY UNREMARKABLE    Smear Review Normal platelet morphology     Comment: PLATELET COUNT CONFIRMED BY SMEAR   Immature Granulocytes 2 %   Abs Immature Granulocytes 0.13 (H) 0.00 - 0.07 K/uL   Dimorphism PRESENT    Polychromasia PRESENT     Comment: Performed at [facility], [address] [place], [place] [address]  Comprehensive metabolic panel     Status: Abnormal   Collection Time: 02/22/21  6:21 AM  Result Value Ref Range   Sodium 137 135 - 145 mmol/L   Potassium 3.7 3.5 - 5.1 mmol/L   Chloride 105 98 - 111 mmol/L   CO2 26 22 - 32 mmol/L   Glucose, Bld 99 70 - 99 mg/dL    Comment: Glucose reference range applies only to samples taken after fasting for at least 8 hours.   BUN 8 6 - 20 mg/dL   Creatinine, Ser 0.58 0.44 - 1.00 mg/dL   Calcium 8.5 (L) 8.9 - 10.3 mg/dL   Total Protein 6.1 (L) 6.5 - 8.1 g/dL   Albumin 2.7 (L) 3.5 - 5.0 g/dL   AST 23 15 - 41 U/L   ALT 15 0 - 44 U/L   Alkaline Phosphatase 59 38 - 126 U/L   Total Bilirubin 0.7 0.3 - 1.2 mg/dL   GFR, Estimated >60 >60 mL/min    Comment: (NOTE) Calculated using the CKD-EPI Creatinine Equation (2021)    Anion gap 6 5 - 15    Comment: Performed at [facility], [address],  [place], [place] [address]  Magnesium     Status: None   Collection Time: 02/22/21  6:21 AM  Result Value Ref Range   Magnesium 1.9 1.7 - 2.4 mg/dL    Comment: Performed at [facility], [address] [place], [place] [address]  Phosphorus     Status: None   Collection Time: 02/22/21  6:21 AM  Result Value Ref Range   Phosphorus 2.9 2.5 - 4.6 mg/dL    Comment: Performed at [facility], [address] [place], [place] [address]  CBC     Status: Abnormal   Collection Time: 02/22/21  8:48 AM  Result Value Ref Range   WBC 8.6 4.0 - 10.5 K/uL   RBC 3.39 (L) 3.87 - 5.11 MIL/uL   Hemoglobin 8.1 (L) 12.0 - 15.0 g/dL    Comment: Reticulocyte Hemoglobin testing may be clinically indicated, consider ordering this additional test [id number]    HCT 23.9 (L) 36.0 - 46.0 %   MCV 70.5 (L) 80.0 - 100.0 fL   MCH 23.9 (L) 26.0 - 34.0 pg   MCHC 33.9 30.0 - 36.0 g/dL    Comment: CORRECTED FOR COLD AGGLUTININS   RDW 29.3 (H) 11.5 - 15.5 %   Platelets 363 150 - 400 K/uL   nRBC 0.4 (H) 0.0 - 0.2 %    Comment: Performed at [facility], [address] [place], [place]  [address]     PHQ2/9: Depression screen [facility] 2/9 03/02/2021 08/25/2020 04/15/2020 02/13/2020 01/15/2020  Decreased Interest 0 0 1 0 0  Down, Depressed, Hopeless 0 0 1 0 0  PHQ - 2 Score 0 0 2 0 0  Altered sleeping - - 1 0 0  Tired, decreased energy - - 3 0 0  Change in appetite - - 1 0 0  Feeling bad or failure about yourself  - - 0 0 0  Trouble concentrating - - 0 0 0  Moving slowly or fidgety/restless - - 0 0 0  Suicidal thoughts - - 0 0 0  PHQ-9 Score - - 7 0 0  Difficult doing work/chores - - Somewhat difficult - Not difficult at all  Some recent data might be hidden    phq 9 is negative   Fall Risk: Fall Risk  03/02/2021 08/25/2020 04/15/2020 02/13/2020 01/15/2020  Falls in the past year? 0 0 - 0 0  Number falls in past yr: 0 0 1 0 0  Injury with Fall? 0 0 0 0 0  Risk for fall due to : - - History of  fall(s);Impaired balance/gait - -  Follow up - - - - Falls evaluation completed      Functional Status Survey: Is the patient deaf or have difficulty hearing?: No Does the patient have difficulty seeing, even when wearing glasses/contacts?: No Does the patient have difficulty concentrating, remembering, or making decisions?: No Does the patient have difficulty walking or climbing stairs?: No Does the patient have difficulty dressing or bathing?: No Does the patient have difficulty doing errands alone such as visiting a doctor's office or shopping?: No    Assessment & Plan  1. Hospital discharge follow-up  - CBC with Differential/Platelet - COMPLETE METABOLIC PANEL WITH GFR  2. Multiple subsegmental pulmonary emboli without acute cor pulmonale (HCC)  - [name] Art/Ven Flow Abd Pelv Doppler; Future  3. History of myomectomy  - [name] Art/Ven Flow Abd Pelv Doppler; Future - CBC with Differential/Platelet  4. Acute on chronic blood loss anemia  -  CBC with Differential/Platelet  5. Pleural effusion on right  - DG Chest 2 View; Future  6. Hypocalcemia   7. Mild protein-calorie malnutrition ([place])   8. Allergic contact dermatitis due to adhesives  - triamcinolone cream (KENALOG) 0.1 %; Apply 1 application topically 2 (two) times daily.  Dispense: 45 g; Refill: 0

## 2021-03-02 NOTE — Anesthesia Postprocedure Evaluation (Signed)
Anesthesia Post Note  Patient: Sharon Herring  Procedure(s) Performed: XI ROBOTIC ASSISTED MYOMECTOMY CHROMOPERTUBATION  Patient location during evaluation: PACU Anesthesia Type: General Level of consciousness: awake and alert Pain management: pain level controlled Vital Signs Assessment: post-procedure vital signs reviewed and stable Respiratory status: spontaneous breathing, nonlabored ventilation, respiratory function stable and patient connected to nasal cannula oxygen Cardiovascular status: blood pressure returned to baseline and stable Postop Assessment: no apparent nausea or vomiting Anesthetic complications: no   No notable events documented.   Last Vitals:  Vitals:   02/22/21 0741 02/22/21 1202  BP: 132/78 135/89  Pulse: 83 80  Resp:    Temp: 36.9 C 37.1 C  SpO2: 98% 100%    Last Pain:  Vitals:   02/22/21 1205  TempSrc:   PainSc: [place] [facility] [place]

## 2021-03-03 ENCOUNTER — Other Ambulatory Visit: Payer: Self-pay | Admitting: Family Medicine

## 2021-03-03 DIAGNOSIS — I2694 Multiple subsegmental pulmonary emboli without acute cor pulmonale: Secondary | ICD-10-CM

## 2021-03-03 DIAGNOSIS — Z9889 Other specified postprocedural states: Secondary | ICD-10-CM

## 2021-03-03 LAB — CBC WITH DIFFERENTIAL/PLATELET
Absolute Monocytes: 838 cells/uL (ref 200–950)
Basophils Absolute: 76 cells/uL (ref 0–200)
Basophils Relative: 0.6 %
Eosinophils Absolute: 432 cells/uL (ref 15–500)
Eosinophils Relative: 3.4 %
HCT: 28.5 % — ABNORMAL LOW (ref 35.0–45.0)
Hemoglobin: 8.8 g/dL — ABNORMAL LOW (ref 11.7–15.5)
Lymphs Abs: 2286 cells/uL (ref 850–3900)
MCH: 22.7 pg — ABNORMAL LOW (ref 27.0–33.0)
MCHC: 30.9 g/dL — ABNORMAL LOW (ref 32.0–36.0)
MCV: 73.6 fL — ABNORMAL LOW (ref 80.0–100.0)
MPV: 9.9 fL (ref 7.5–12.5)
Monocytes Relative: 6.6 %
Neutro Abs: 9068 cells/uL — ABNORMAL HIGH (ref 1500–7800)
Neutrophils Relative %: 71.4 %
Platelets: 394 10*3/uL (ref 140–400)
RBC: 3.87 10*6/uL (ref 3.80–5.10)
RDW: 31 % — ABNORMAL HIGH (ref 11.0–15.0)
Total Lymphocyte: 18 %
WBC: 12.7 10*3/uL — ABNORMAL HIGH (ref 3.8–10.8)

## 2021-03-03 LAB — COMPLETE METABOLIC PANEL WITH GFR
AG Ratio: 1.1 (calc) (ref 1.0–2.5)
ALT: 9 U/L (ref 6–29)
AST: 10 U/L (ref 10–30)
Albumin: 3.8 g/dL (ref 3.6–5.1)
Alkaline phosphatase (APISO): 84 U/L (ref 31–125)
BUN: 7 mg/dL (ref 7–25)
CO2: 27 mmol/L (ref 20–32)
Calcium: 8.9 mg/dL (ref 8.6–10.2)
Chloride: 104 mmol/L (ref 98–110)
Creat: 0.82 mg/dL (ref 0.50–0.99)
Globulin: 3.4 g/dL (calc) (ref 1.9–3.7)
Glucose, Bld: 96 mg/dL (ref 65–99)
Potassium: 4.4 mmol/L (ref 3.5–5.3)
Sodium: 138 mmol/L (ref 135–146)
Total Bilirubin: 0.5 mg/dL (ref 0.2–1.2)
Total Protein: 7.2 g/dL (ref 6.1–8.1)
eGFR: 91 mL/min/{1.73_m2} (ref >=60)

## 2021-03-04 ENCOUNTER — Other Ambulatory Visit: Payer: Self-pay

## 2021-03-04 ENCOUNTER — Ambulatory Visit
Admission: RE | Admit: 2021-03-04 | Discharge: 2021-03-04 | Disposition: A | Discharge status: Home / Self Care | Payer: 59 | Source: Ambulatory Visit | Attending: Family Medicine | Admitting: Family Medicine

## 2021-03-04 DIAGNOSIS — J90 Pleural effusion, not elsewhere classified: Secondary | ICD-10-CM | POA: Diagnosis present

## 2021-03-04 DIAGNOSIS — Z9889 Other specified postprocedural states: Secondary | ICD-10-CM | POA: Diagnosis present

## 2021-03-04 DIAGNOSIS — I2694 Multiple subsegmental pulmonary emboli without acute cor pulmonale: Secondary | ICD-10-CM | POA: Insufficient documentation

## 2021-03-04 MED ORDER — IOHEXOL 350 MG/ML SOLN
125.0000 mL | Freq: Once | INTRAVENOUS | Status: AC | PRN
  Administered 2021-03-04: 125 mL via INTRAVENOUS

## 2021-03-05 ENCOUNTER — Telehealth: Payer: Self-pay

## 2021-03-05 NOTE — Telephone Encounter (Signed)
Pt was not able to pick up her Eliquis prescription because the insurance company need to hear from Dr Ancil Boozer or a represenative. They are saying they need to know why this prescription is needed.

## 2021-03-08 ENCOUNTER — Telehealth: Payer: Self-pay | Admitting: Obstetrics and Gynecology

## 2021-03-08 NOTE — Telephone Encounter (Signed)
Sonal with [facility] called and has a question on Rx sent for Lupron [place]. Reference # [id number]

## 2021-03-08 NOTE — Telephone Encounter (Signed)
Authorization approved

## 2021-03-10 NOTE — Telephone Encounter (Signed)
Spoke to [facility] concerning the call to the office. Gave all information needed to complete the rx for the pt. They requested information concerning medication instruction and dose amount and refill amt.

## 2021-03-10 NOTE — Telephone Encounter (Signed)
[facility] has called multiple times trying to clarify information on patients Rx that was sent.  She is asking that someone please call her back today.  Ms Posey Pronto - [phone]  Reference # [id number]

## 2021-03-22 ENCOUNTER — Encounter: Payer: Self-pay | Admitting: Hematology and Oncology

## 2021-03-23 ENCOUNTER — Other Ambulatory Visit: Payer: Self-pay

## 2021-03-23 ENCOUNTER — Ambulatory Visit (INDEPENDENT_AMBULATORY_CARE_PROVIDER_SITE_OTHER): Payer: Self-pay | Admitting: Obstetrics and Gynecology

## 2021-03-23 ENCOUNTER — Encounter: Payer: Self-pay | Admitting: Obstetrics and Gynecology

## 2021-03-23 VITALS — BP 107/77 | HR 74 | Resp 16 | Ht 65.0 in | Wt 190.1 lb

## 2021-03-23 DIAGNOSIS — Z9289 Personal history of other medical treatment: Secondary | ICD-10-CM

## 2021-03-23 DIAGNOSIS — Z9889 Other specified postprocedural states: Secondary | ICD-10-CM

## 2021-03-23 DIAGNOSIS — I2692 Saddle embolus of pulmonary artery without acute cor pulmonale: Secondary | ICD-10-CM

## 2021-03-23 DIAGNOSIS — Z4889 Encounter for other specified surgical aftercare: Secondary | ICD-10-CM

## 2021-03-23 DIAGNOSIS — D500 Iron deficiency anemia secondary to blood loss (chronic): Secondary | ICD-10-CM

## 2021-03-23 NOTE — Progress Notes (Signed)
    OBSTETRICS/GYNECOLOGY POST-OPERATIVE CLINIC VISIT  Subjective:     Sharon Herring is a 45 y.o. [id number] female who presents to the clinic 5 weeks status post  robotic myomectomy  and chromotubation for abnormal uterine bleeding, fibroids, and infertility. H/o pre-operative blood transfusion 3 units PRBCs. Eating a regular diet without difficulty. Bowel movements are normal. The patient is not having any pain.   Reports having recent cycle, currently on Day #7, bleeding was heavy for fist 3 days, but has tapered. Also with less cramping this cycle than before. Is taking iron pills. Also on anti-coagulation for recent pulmonary embolism diagnosed.    The following portions of the patient's history were reviewed and updated as appropriate: allergies, current medications, past family history, past medical history, past social history, past surgical history, and problem list.  Review of Systems Pertinent items noted in HPI and remainder of comprehensive ROS otherwise negative.    Objective:    BP 107/77   Pulse 74   Resp 16   Ht '5\' 5"'$  (1.651 m)   Wt 190 lb 1.6 oz (86.2 kg)   LMP 03/23/2021   BMI 31.63 kg/m  General:  alert and no distress  Abdomen: soft, bowel sounds active, non-tender  Incision:   healing well, no drainage, no erythema, no hernia, no seroma, no swelling, no dehiscence, incision well approximated  Pelvis: deferred    Labs:  Lab Results  Component Value Date   WBC 12.7 (H) 03/02/2021   HGB 8.8 (L) 03/02/2021   HCT 28.5 (L) 03/02/2021   MCV 73.6 (L) 03/02/2021   PLT 394 03/02/2021    Pathology:   A. UTERUS; MYOMECTOMY:  - LEIOMYOMA(S), FRAGMENTS.   Assessment:   1. Postoperative visit   2. S/P myomectomy   3. History of blood transfusion   4. Iron deficiency anemia due to chronic blood loss   5. Acute saddle pulmonary embolism without acute cor pulmonale (HCC)     Plan:   Doing well post-operatively.   2. Wound care discussed. 3. Will recheck  H/H due to patient's history of anemia and recent cycle.  4. Activity restrictions: none 5. Anticipated return to work: has already resumed work (from home)  6. Fertility management -  At this time it is advised to wait at least 6 months while on blood thinners.  She also will need further management of her cycles due to significant anemia caused by blood loss. Is not a candidate for most hormonal birth controls. Awaiting on Lupron medication while on blood thinners to help cease cycles. Return to fertility after use is ~ 6-10 weeks.  7. Follow up: will notify patient once Lupron injection arrives and schedule accordingly.    Rubie Maid, MD [facility]

## 2021-03-24 NOTE — Progress Notes (Deleted)
Name: Sharon Herring   MRN: [id number]    DOB: 1976-05-18   Date:03/24/2021       Progress Note  Subjective  Chief Complaint  Annual Exam  HPI  Patient presents for annual CPE.  Diet: *** Exercise: ***   Flowsheet Row Video Visit from 08/25/2020 in [facility]  AUDIT-C Score 0      Depression: Phq 9 is  {Desc; negative/positive:13464} Depression screen [facility] 2/9 03/02/2021 08/25/2020 04/15/2020 02/13/2020 01/15/2020  Decreased Interest 0 0 1 0 0  Down, Depressed, Hopeless 0 0 1 0 0  PHQ - 2 Score 0 0 2 0 0  Altered sleeping - - 1 0 0  Tired, decreased energy - - 3 0 0  Change in appetite - - 1 0 0  Feeling bad or failure about yourself  - - 0 0 0  Trouble concentrating - - 0 0 0  Moving slowly or fidgety/restless - - 0 0 0  Suicidal thoughts - - 0 0 0  PHQ-9 Score - - 7 0 0  Difficult doing work/chores - - Somewhat difficult - Not difficult at all  Some recent data might be hidden   Hypertension: BP Readings from Last 3 Encounters:  03/23/21 107/77  03/02/21 120/72  02/26/21 127/81   Obesity: Wt Readings from Last 3 Encounters:  03/23/21 190 lb 1.6 oz (86.2 kg)  03/02/21 187 lb (84.8 kg)  02/26/21 193 lb 9.6 oz (87.8 kg)   BMI Readings from Last 3 Encounters:  03/23/21 31.63 kg/m  03/02/21 31.12 kg/m  02/26/21 32.22 kg/m     Vaccines:  HPV: up to at age 48 , ask insurance if age between 35-45  Shingrix: 27-64 yo and ask insurance if covered when patient above 26 yo Pneumonia: educated and discussed with patient. Flu: educated and discussed with patient.  Hep C Screening: 01/22/19 STD testing and prevention (HIV/chl/gon/syphilis): 01/22/19 Intimate partner violence:negative Sexual History : Menstrual History/LMP/Abnormal Bleeding:  Incontinence Symptoms:   Breast cancer:  - Last Mammogram: N/A - BRCA gene screening: N/A  Osteoporosis: Discussed high calcium and vitamin D supplementation, weight bearing exercises  Cervical cancer  screening: 01/22/19  Skin cancer: Discussed monitoring for atypical lesions  Colorectal cancer: N/A   Lung cancer:  Low Dose CT Chest recommended if Age 64-80 years, 20 pack-year currently smoking OR have quit w/in 15years. Patient does not qualify.   ECG: 02/20/21  Advanced Care Planning: A voluntary discussion about advance care planning including the explanation and discussion of advance directives.  Discussed health care proxy and Living will, and the patient was able to identify a health care proxy as ***.  Patient does not have a living will at present time. If patient does have living will, I have requested they bring this to the clinic to be scanned in to their chart.  Lipids: Lab Results  Component Value Date   CHOL 171 09/25/2019   CHOL 171 01/22/2019   CHOL 170 12/14/2017   Lab Results  Component Value Date   HDL 41 09/25/2019   HDL 35 (L) 01/22/2019   HDL 41 (L) 12/14/2017   Lab Results  Component Value Date   LDLCALC 114 (H) 09/25/2019   LDLCALC 114 (H) 01/22/2019   LDLCALC 114 (H) 12/14/2017   Lab Results  Component Value Date   TRIG 86 09/25/2019   TRIG 108 01/22/2019   TRIG 63 12/14/2017   Lab Results  Component Value Date   CHOLHDL 4.2 09/25/2019  CHOLHDL 4.9 01/22/2019   CHOLHDL 4.1 12/14/2017   No results found for: LDLDIRECT  Glucose: Glucose, Bld  Date Value Ref Range Status  03/02/2021 96 65 - 99 mg/dL Final    Comment:    .            Fasting reference interval .   02/22/2021 99 70 - 99 mg/dL Final    Comment:    Glucose reference range applies only to samples taken after fasting for at least 8 hours.  02/21/2021 91 70 - 99 mg/dL Final    Comment:    Glucose reference range applies only to samples taken after fasting for at least 8 hours.    Patient Active Problem List   Diagnosis Date Noted   History of blood transfusion 02/26/2021   Acute pulmonary embolism (HCC)    S/P myomectomy 02/19/2021   Anemia associated with acute blood  loss 02/17/2021   Symptomatic anemia 10/26/2020   Lightheadedness 04/15/2020   Liver lesion 01/15/2020   Hiatal hernia 01/15/2020   Lymphadenopathy 01/16/2018   Moderate obstructive sleep apnea 12/19/2017   Swelling of limb 01/27/2017   B12 deficiency 03/28/2016   Vitamin D deficiency 03/28/2016   Hyperglycemia 03/28/2016   Multiple gastric polyps 01/08/2016   Menorrhagia 11/13/2015   Chronic constipation 09/23/2015   Obesity, Class I, BMI 30.0-34.9 (see actual BMI) 06/02/2015   Iron deficiency anemia 03/14/2014   History of uterine fibroid 03/14/2014    Past Surgical History:  Procedure Laterality Date   APPENDECTOMY  2012   CHOLECYSTECTOMY     CHROMOPERTUBATION N/A 02/18/2021   Procedure: CHROMOPERTUBATION;  Surgeon: Rubie Maid, MD;  Location: [facility] OR[facility];  Service: Gynecology;  Laterality: N/A;   ESOPHAGOGASTRODUODENOSCOPY (EGD) WITH PROPOFOL N/A 01/06/2016   Procedure: ESOPHAGOGASTRODUODENOSCOPY (EGD) WITH PROPOFOL;  Surgeon: Robert Bellow, MD;  Location: [facility];  Service: Endoscopy;  Laterality: N/A;   HERNIA REPAIR     ROBOT ASSISTED MYOMECTOMY N/A 02/18/2021   Procedure: XI ROBOTIC ASSISTED MYOMECTOMY;  Surgeon: Rubie Maid, MD;  Location: [facility];  Service: Gynecology;  Laterality: N/A;   ROBOTIC ASSISTED LAPAROSCOPIC CHOLECYSTECTOMY N/A 03/03/2016   Procedure: ROBOTIC ASSISTED LAPAROSCOPIC CHOLECYSTECTOMY ;  Surgeon: Clayburn Pert, MD;  Location: [facility];  Service: General;  Laterality: N/A;   UTERINE FIBROID SURGERY  2012   XI ROBOTIC ASSISTED VENTRAL HERNIA N/A 07/15/2019   Procedure: XI ROBOTIC ASSISTED VENTRAL HERNIA;  Surgeon: Jules Husbands, MD;  Location: [facility];  Service: General;  Laterality: N/A;    Family History  Problem Relation Age of Onset   Hypertension Mother    Hypertension Father    Pancreatic cancer Father 7   Breast cancer Neg Hx    Colon cancer Neg Hx     Social History   Socioeconomic History   Marital status: Married     Spouse name: Not on file   Number of children: 0   Years of education: Not on file   Highest education level: Bachelor's degree (e.g., BA, AB, BS)  Occupational History   Occupation: CMA  Tobacco Use   Smoking status: Never   Smokeless tobacco: Never  Vaping Use   Vaping Use: Never used  Substance and Sexual Activity   Alcohol use: No    Alcohol/week: 0.0 standard drinks   Drug use: No   Sexual activity: Not Currently    Partners: Male    Birth control/protection: None  Other Topics Concern   Not on file  Social History Narrative  Lives alone, works at [employer] Strain: Not on file  Food Insecurity: Not on file  Transportation Needs: Not on file  Physical Activity: Not on file  Stress: Not on file  Social Connections: Not on file  Intimate Partner Violence: Not on file     Current Outpatient Medications:    acetaminophen (TYLENOL) 500 MG tablet, Take 2 tablets (1,000 mg total) by mouth every 6 (six) hours., Disp: 30 tablet, Rfl: 0   albuterol (PROVENTIL) (2.5 MG/3ML) 0.083% nebulizer solution, Take 3 mLs (2.5 mg total) by nebulization every 6 (six) hours as needed for wheezing or shortness of breath., Disp: 150 mL, Rfl: 1   albuterol (VENTOLIN HFA) 108 (90 Base) MCG/ACT inhaler, Inhale 2 puffs into the lungs every 6 (six) hours as needed for wheezing or shortness of breath., Disp: 18 g, Rfl: 0   apixaban (ELIQUIS) 5 MG TABS tablet, Take 1 tablet (5 mg total) by mouth 2 (two) times daily., Disp: 60 tablet, Rfl: 4   docusate sodium (COLACE) 100 MG capsule, Take 1 capsule (100 mg total) by mouth 2 (two) times daily., Disp: 10 capsule, Rfl: 0   ferrous sulfate (FERROUSUL) 325 (65 FE) MG tablet, Take 1 tablet (325 mg total) by mouth 3 (three) times daily with meals., Disp: 90 tablet, Rfl: 1   fluticasone (FLONASE) 50 MCG/ACT nasal spray, Place 2 sprays into both nostrils daily. (Patient taking differently: Place 2  sprays into both nostrils daily as needed for allergies.), Disp: 16 g, Rfl: 6   furosemide (LASIX) 20 MG tablet, Take 1 tablet (20 mg total) by mouth daily. (Patient taking differently: Take 20 mg by mouth daily as needed for fluid.), Disp: 30 tablet, Rfl: 1   hydrocortisone cream 0.5 %, Apply 1 application topically 2 (two) times daily as needed for itching., Disp: 30 g, Rfl: 0   HYDROmorphone (DILAUDID) 2 MG tablet, Take 1 tablet (2 mg total) by mouth every 4 (four) hours as needed for moderate pain or severe pain (may take up to 2 tablets every 6 hours for severe pain)., Disp: 20 tablet, Rfl: 0   leuprolide (LUPRON DEPOT, 88-MONTH,) 11.25 MG injection, Inject 11.25 mg into the muscle every 3 (three) months., Disp: 1 each, Rfl: 1   linaclotide (LINZESS) 290 MCG CAPS capsule, Take 1 capsule (290 mcg total) by mouth daily before breakfast., Disp: 30 capsule, Rfl: 2   Multiple Vitamins-Minerals (MULTIVITAMIN ADULT) TABS, Take 1 tablet by mouth daily., Disp: 30 tablet, Rfl: 0   pantoprazole (PROTONIX) 40 MG tablet, Take 1 tablet (40 mg total) by mouth daily., Disp: 30 tablet, Rfl: 0   triamcinolone cream (KENALOG) 0.1 %, Apply 1 application topically 2 (two) times daily., Disp: 45 g, Rfl: 0  Allergies  Allergen Reactions   Aspirin Nausea And Vomiting   Oxycodone-Acetaminophen Rash and Shortness Of Breath   Lexapro [Escitalopram] Palpitations   Tape Other (See Comments)    Burning feeling. Has left mark on skin.   Belviq [Lorcaserin Hcl] Other (See Comments)    headache   Dicyclomine Nausea And Vomiting   Contrave [Naltrexone-Bupropion Hcl Er] Anxiety    Altered mental state   Norethindrone Rash     ROS  ***  Objective  There were no vitals filed for this visit.  There is no height or weight on file to calculate BMI.  Physical Exam ***  Recent Results (from the past 2160 hour(s))  POCT urinalysis dipstick  Status: None   Collection Time: 01/13/21  2:59 PM  Result Value Ref  Range   Color, UA auburn    Clarity, UA cloudy    Glucose, UA Negative Negative   Bilirubin, UA neg    Ketones, UA neg    Spec Grav, UA 1.020 1.010 - 1.025   Blood, UA large    pH, UA 6.0 5.0 - 8.0   Protein, UA Negative Negative   Urobilinogen, UA 0.2 0.2 or 1.0 E.U./dL   Nitrite, UA neg    Leukocytes, UA Negative Negative   Appearance auburn;cloudy    Odor    FSH/LH     Status: None   Collection Time: 01/13/21  3:52 PM  Result Value Ref Range   LH 4.7 mIU/mL    Comment:                     Adult Female:                       Follicular phase      2.4 -  12.6                       Ovulation phase      14.0 -  95.6                       Luteal phase          1.0 -  11.4                       Postmenopausal        7.7 -  58.5    FSH 4.7 mIU/mL    Comment:                     Adult Female:                       Follicular phase      3.5 -  12.5                       Ovulation phase       4.7 -  21.5                       Luteal phase          1.7 -   7.7                       Postmenopausal       25.8 - 134.8   Estradiol     Status: None   Collection Time: 01/13/21  3:52 PM  Result Value Ref Range   Estradiol 20.3 pg/mL    Comment:                     Adult Female:                       Follicular phase   10.6 -   166.0                       Ovulation phase    85.8 -   498.0  Luteal phase       43.8 -   211.0                       Postmenopausal     <6.0 -    54.7                     Pregnancy                       1st trimester     215.0 - >4300.0 Roche ECLIA methodology   Anti mullerian hormone     Status: None   Collection Time: 01/13/21  3:52 PM  Result Value Ref Range   ANTI-MULLERIAN HORMONE (AMH) 0.396 ng/mL    Comment: For assays employing antibodies, the possibility exists for interference by heterophile antibodies in the samples.1  1.Kricka L.  Interferences in Immunoassays - still a threat.  Clin. Chem. 2000; 46: 9528-4132.  This  test was developed and its performance characteristics determined by [facility]. It has not been cleared or approved by the Food and Drug Administration. Reference Range: Females 41 - 46y: 0.26 - 5.81 Median 0.58 AMH concentrations of >= 1.06 ng/mL is correlated with a better response to ovarian stimulation, produced more retrievable oocytes and higher odds of live birth according to Gleicher et al.  Buelah Manis and Sterility. 2010: 44:0102-7253.  The current AMH test method correlates with the study method with a slope of 0.94. Females at risk of ovarian hyperstimulation syndrome or polycystic ovarian syndrome (PCOS) may exhibit elevated serum AMH concentrations.   AMH levels from PCOS patients may be 2 to 5 fold higher than age-appropriate reference int erval values. Granulosa cell tumors of the ovary may secrete AMH along with other tumor markers.  Elevated AMH is not specific for malignancy, and the assay should not be used exclusively to diagnose or exclude an AMH-secreting ovarian tumor.   TSH     Status: None   Collection Time: 01/13/21  3:52 PM  Result Value Ref Range   TSH 2.210 0.450 - 4.500 uIU/mL  Progesterone     Status: None   Collection Time: 01/13/21  3:52 PM  Result Value Ref Range   Progesterone 0.5 ng/mL    Comment:                      Follicular phase       0.1 -   0.9                      Luteal phase           1.8 -  23.9                      Ovulation phase        0.1 -  12.0                      Pregnant                         First trimester    11.0 -  44.3                         Second trimester   25.4 -  83.3  Third trimester    58.7 - 214.0                      Postmenopausal         0.0 -   0.1   NuSwab Vaginitis Plus (VG+)     Status: Abnormal   Collection Time: 01/13/21  4:45 PM  Result Value Ref Range   Atopobium vaginae Moderate - 1 Score   BVAB 2 High - 2 (A) Score   Megasphaera 1 High - 2 (A) Score    Comment:  Calculate total score by adding the 3 individual bacterial vaginosis (BV) marker scores together.  Total score is interpreted as follows: Total score 0-1: Indicates the absence of BV. Total score   2: Indeterminate for BV. Additional clinical                  data should be evaluated to establish a                  diagnosis. Total score 3-6: Indicates the presence of BV. This test was developed and its performance characteristics determined by [facility].  It has not been cleared or approved by the Food and Drug Administration.    Candida albicans, NAA Positive (A) Negative   Candida glabrata, NAA Negative Negative   Trich vag by NAA Negative Negative   Chlamydia trachomatis, NAA Negative Negative   Neisseria gonorrhoeae, NAA Negative Negative  SARS CORONAVIRUS 2 (TAT 6-24 HRS) Nasopharyngeal Nasopharyngeal Swab     Status: None   Collection Time: 02/16/21  8:25 AM   Specimen: Nasopharyngeal Swab  Result Value Ref Range   SARS Coronavirus 2 NEGATIVE NEGATIVE    Comment: (NOTE) SARS-CoV-2 target nucleic acids are NOT DETECTED.  The SARS-CoV-2 RNA is generally detectable in upper and lower respiratory specimens during the acute phase of infection. Negative results do not preclude SARS-CoV-2 infection, do not rule out co-infections with other pathogens, and should not be used as the sole basis for treatment or other patient management decisions. Negative results must be combined with clinical observations, patient history, and epidemiological information. The expected result is Negative.  Fact Sheet for Patients: SugarRoll.be  Fact Sheet for Healthcare Providers: https://www.woods-mathews.com/  This test is not yet approved or cleared by the [place] FDA and  has been authorized for detection and/or diagnosis of SARS-CoV-2 by FDA under an Emergency Use Authorization (EUA). This EUA will remain  in effect (meaning this test can be  used) for the duration of the COVID-19 declaration under Se ction 564(b)(1) of the Act, 21 U.S.C. section 360bbb-3(b)(1), unless the authorization is terminated or revoked sooner.  Performed at [facility], [place] [address], [place], [place] [address]   CBC     Status: Abnormal   Collection Time: 02/16/21  8:33 AM  Result Value Ref Range   WBC 5.1 4.0 - 10.5 K/uL   RBC 3.51 (L) 3.87 - 5.11 MIL/uL   Hemoglobin 5.9 (L) 12.0 - 15.0 g/dL    Comment: Reticulocyte Hemoglobin testing may be clinically indicated, consider ordering this additional test [id number]    HCT 21.2 (L) 36.0 - 46.0 %   MCV 60.4 (L) 80.0 - 100.0 fL   MCH 16.8 (L) 26.0 - 34.0 pg   MCHC 27.8 (L) 30.0 - 36.0 g/dL   RDW 25.6 (H) 11.5 - 15.5 %   Platelets 1,096 (HH) 150 - 400 K/uL    Comment: SPECIMEN CHECKED FOR CLOTS  REPEATED TO VERIFY PLATELET COUNT CONFIRMED BY SMEAR PLATELETS APPEAR INCREASED CRITICAL RESULT CALLED TO, READ BACK BY AND VERIFIED WITH: Laury Deep, RN 1050 02/16/21 GAA    nRBC 0.4 (H) 0.0 - 0.2 %    Comment: Performed at [facility], [address] [place], [place] [address]  Type and screen     Status: None   Collection Time: 02/16/21  8:33 AM  Result Value Ref Range   ABO/RH(D) A POS    Antibody Screen POS    Sample Expiration 02/20/2021,2359    Extend sample reason NO TRANSFUSIONS OR PREGNANCY IN THE PAST 3 MONTHS    Antibody Identification ANTI M ANTI K    PT AG Type      NEGATIVE FOR KELL ANTIGEN Performed at [facility], [address] [place], [place] [address]    Unit Number [id number]    Blood Component Type RED CELLS,LR    Unit division 00    Status of Unit ISSUED,FINAL    Transfusion Status OK TO TRANSFUSE    Crossmatch Result COMPATIBLE    Donor AG Type NEGATIVE FOR KELL ANTIGEN    Unit Number [id number]    Blood Component Type RED CELLS,LR    Unit division 00    Status of Unit ISSUED,FINAL    Transfusion Status OK TO TRANSFUSE     Crossmatch Result COMPATIBLE    Donor AG Type NEGATIVE FOR KELL ANTIGEN    Unit Number [id number]    Blood Component Type RED CELLS,LR    Unit division 00    Status of Unit ISSUED,FINAL    Transfusion Status OK TO TRANSFUSE    Crossmatch Result COMPATIBLE    Donor AG Type NEGATIVE FOR KELL ANTIGEN    Unit Number [id number]    Blood Component Type RED CELLS,LR    Unit division 00    Status of Unit REL FROM [facility]    Transfusion Status OK TO TRANSFUSE    Crossmatch Result COMPATIBLE    Unit Number [id number]    Blood Component Type RED CELLS,LR    Unit division 00    Status of Unit ISSUED,FINAL    Transfusion Status OK TO TRANSFUSE    Crossmatch Result COMPATIBLE    Donor AG Type NEGATIVE FOR KELL ANTIGEN   Comprehensive metabolic panel     Status: Abnormal   Collection Time: 02/16/21  8:33 AM  Result Value Ref Range   Sodium 139 135 - 145 mmol/L   Potassium 3.7 3.5 - 5.1 mmol/L   Chloride 108 98 - 111 mmol/L   CO2 27 22 - 32 mmol/L   Glucose, Bld 98 70 - 99 mg/dL    Comment: Glucose reference range applies only to samples taken after fasting for at least 8 hours.   BUN 10 6 - 20 mg/dL   Creatinine, Ser 0.67 0.44 - 1.00 mg/dL   Calcium 8.8 (L) 8.9 - 10.3 mg/dL   Total Protein 7.4 6.5 - 8.1 g/dL   Albumin 3.6 3.5 - 5.0 g/dL   AST 16 15 - 41 U/L   ALT 9 0 - 44 U/L   Alkaline Phosphatase 62 38 - 126 U/L   Total Bilirubin 0.3 0.3 - 1.2 mg/dL   GFR, Estimated >60 >60 mL/min    Comment: (NOTE) Calculated using the CKD-EPI Creatinine Equation (2021)    Anion gap 4 (L) 5 - 15    Comment: Performed at [facility], [address], [place], [place] [address]  Pathologist  smear review     Status: None   Collection Time: 02/16/21  8:33 AM  Result Value Ref Range   Path Review      Blood smear is reviewed, specimen from February 16, 2021    Comment: There is marked thrombocytosis with giant platelets. There is severe microcytic anemia with a rare nucleated RBC.  Leukocytes are unremarkable. I note the history of iron deficiency anemia with previous transfusion, now with alloantibodies including anti Kell. The thrombocytosis may be due to iron deficiency, but careful followup is indicated to confirm a decrease in the platelet count after  iron replenishment, in order to exclude a myeloproliferative neoplasm. Reviewed by Lemmie Evens. Dicie Beam, MD. Performed at [facility], [address] [place], [place] [address]   BPAM RBC     Status: None   Collection Time: 02/16/21  8:33 AM  Result Value Ref Range   ISSUE DATE / TIME 681157262035    Blood Product Unit Number [id number]    PRODUCT CODE [id number]    Unit Type and Rh 4825    Blood Product Expiration Date 003704888916    ISSUE DATE / TIME 945038882800    Blood Product Unit Number [id number]    PRODUCT CODE [id number]    Unit Type and Rh 3748    Blood Product Expiration Date 270786754492    ISSUE DATE / TIME 010071219758    Blood Product Unit Number [id number]    PRODUCT CODE X0940H68    Unit Type and Rh 0881    Blood Product Expiration Date 103159458592    Blood Product Unit Number [id number]    PRODUCT CODE [id number]    Unit Type and Rh 6200    Blood Product Expiration Date 833383291916    ISSUE DATE / TIME 606004599774    Blood Product Unit Number [id number]    PRODUCT CODE D5686H68    Unit Type and Rh 6200    Blood Product Expiration Date 372902111552   Prepare RBC (crossmatch)     Status: None   Collection Time: 02/17/21  1:10 PM  Result Value Ref Range   Order Confirmation      ORDER PROCESSED BY BLOOD BANK Performed at [facility], [address] [place], [place] [address]   Prepare RBC (crossmatch)     Status: None   Collection Time: 02/17/21  6:19 PM  Result Value Ref Range   Order Confirmation      ORDER PROCESSED BY BLOOD BANK Performed at [facility], [address] [place], [place] [address]   Hemoglobin and hematocrit, blood      Status: Abnormal   Collection Time: 02/18/21  2:01 AM  Result Value Ref Range   Hemoglobin 9.2 (L) 12.0 - 15.0 g/dL   HCT 25.0 (L) 36.0 - 46.0 %    Comment: Performed at [facility], [address] [place], [place] [address]  Pregnancy, urine     Status: None   Collection Time: 02/18/21  3:20 AM  Result Value Ref Range   Preg Test, Ur NEGATIVE NEGATIVE    Comment: Performed at [facility], [address] [place], [place] [address]  Surgical pathology     Status: None   Collection Time: 02/18/21  9:35 AM  Result Value Ref Range   SURGICAL PATHOLOGY      SURGICAL PATHOLOGY CASE: [id number] PATIENT: Laurina GREEN Surgical Pathology Report     Specimen Submitted: A. Uterine fibroid  Clinical History: Fibroid uterus, menorrhagia  DIAGNOSIS: A. UTERUS; MYOMECTOMY: - LEIOMYOMA(S), FRAGMENTS.  GROSS DESCRIPTION: A. Labeled: Uterine fibroid Received: Formalin Collection time: 12:38 PM on 02/18/2021 Placed into formalin time: 1 PM on 02/18/2021 Tissue fragment(s): Multiple Size: Aggregate, 9.7 x 8.5 x 4.1 cm Description: Received are fragments of tan-white and pink soft tissue. The larger fragments are serially sectioned revealing tan-white, whorled, and firm cut surfaces. Representative sections (1/cm) are submitted in cassettes 1-5.  RB 02/18/2021  Final Diagnosis performed by Bryan Lemma, MD.   Electronically signed 02/19/2021 7:06:54PM The electronic signature indicates that the named Attending Pathologist has evaluated the specimen Technical component performed at [facility], [address], [place],  [place] [address] Lab: [phone] Dir: Rush Farmer, MD, MMM  Professional component performed at [facility], [facility], [place], [place], [place] [address] Lab: [phone] Dir: Dellia Nims. Rubinas, MD   CBC with Differential/Platelet     Status: Abnormal   Collection Time: 02/19/21  7:06 AM  Result Value Ref Range    WBC 17.2 (H) 4.0 - 10.5 K/uL   RBC 3.37 (L) 3.87 - 5.11 MIL/uL   Hemoglobin 7.8 (L) 12.0 - 15.0 g/dL    Comment: Reticulocyte Hemoglobin testing may be clinically indicated, consider ordering this additional test [id number]    HCT 22.6 (L) 36.0 - 46.0 %   MCV 67.1 (L) 80.0 - 100.0 fL   MCH 23.1 (L) 26.0 - 34.0 pg   MCHC 34.5 30.0 - 36.0 g/dL    Comment: CORRECTED FOR COLD AGGLUTININS   RDW 29.4 (H) 11.5 - 15.5 %   Platelets 649 (H) 150 - 400 K/uL   nRBC 0.0 0.0 - 0.2 %   Neutrophils Relative % 86 %   Neutro Abs 14.9 (H) 1.7 - 7.7 K/uL   Lymphocytes Relative 7 %   Lymphs Abs 1.1 0.7 - 4.0 K/uL   Monocytes Relative 5 %   Monocytes Absolute 0.9 0.1 - 1.0 K/uL   Eosinophils Relative 1 %   Eosinophils Absolute 0.1 0.0 - 0.5 K/uL   Basophils Relative 0 %   Basophils Absolute 0.0 0.0 - 0.1 K/uL   WBC Morphology MORPHOLOGY UNREMARKABLE    RBC Morphology MIXED RBC POPULATION    Smear Review PLATELET CLUMPS NOTED ON SMEAR, UNABLE TO ESTIMATE    Immature Granulocytes 1 %   Abs Immature Granulocytes 0.09 (H) 0.00 - 0.07 K/uL    Comment: Performed at [facility], [address] [place], [place] [address]  CBC     Status: Abnormal   Collection Time: 02/19/21 11:55 AM  Result Value Ref Range   WBC 17.1 (H) 4.0 - 10.5 K/uL   RBC 3.35 (L) 3.87 - 5.11 MIL/uL   Hemoglobin 7.9 (L) 12.0 - 15.0 g/dL    Comment: Reticulocyte Hemoglobin testing may be clinically indicated, consider ordering this additional test [id number]    HCT 22.9 (L) 36.0 - 46.0 %   MCV 68.4 (L) 80.0 - 100.0 fL   MCH 23.6 (L) 26.0 - 34.0 pg   MCHC 34.5 30.0 - 36.0 g/dL    Comment: CORRECTED FOR COLD AGGLUTININS   RDW 29.5 (H) 11.5 - 15.5 %   Platelets 358 150 - 400 K/uL   nRBC 0.1 0.0 - 0.2 %    Comment: Performed at [facility], [address] [place], [place] [address]  Comprehensive metabolic panel     Status: Abnormal   Collection Time: 02/19/21 11:55 AM  Result Value Ref Range   Sodium 135  135 -  145 mmol/L   Potassium 3.9 3.5 - 5.1 mmol/L   Chloride 103 98 - 111 mmol/L   CO2 25 22 - 32 mmol/L   Glucose, Bld 115 (H) 70 - 99 mg/dL    Comment: Glucose reference range applies only to samples taken after fasting for at least 8 hours.   BUN 9 6 - 20 mg/dL   Creatinine, Ser 0.75 0.44 - 1.00 mg/dL   Calcium 8.2 (L) 8.9 - 10.3 mg/dL   Total Protein 6.3 (L) 6.5 - 8.1 g/dL   Albumin 3.0 (L) 3.5 - 5.0 g/dL   AST 19 15 - 41 U/L   ALT 9 0 - 44 U/L   Alkaline Phosphatase 47 38 - 126 U/L   Total Bilirubin 0.6 0.3 - 1.2 mg/dL   GFR, Estimated >60 >60 mL/min    Comment: (NOTE) Calculated using the CKD-EPI Creatinine Equation (2021)    Anion gap 7 5 - 15    Comment: Performed at [facility], [address] [place], [place] [address]  D-dimer, quantitative     Status: Abnormal   Collection Time: 02/19/21  6:39 PM  Result Value Ref Range   D-Dimer, Quant 18.22 (H) 0.00 - 0.50 ug/mL-FEU    Comment: (NOTE) At the manufacturer cut-off value of 0.5 g/mL FEU, this assay has a negative predictive value of 95-100%.This assay is intended for use in conjunction with a clinical pretest probability (PTP) assessment model to exclude pulmonary embolism (PE) and deep venous thrombosis (DVT) in outpatients suspected of PE or DVT. Results should be correlated with clinical presentation. Performed at [facility], [address] [place], [place] [address]   APTT     Status: None   Collection Time: 02/19/21  7:14 PM  Result Value Ref Range   aPTT 32 24 - 36 seconds    Comment: Performed at [facility], [address] [place], [place] [address]  Protime-INR     Status: Abnormal   Collection Time: 02/19/21  7:14 PM  Result Value Ref Range   Prothrombin Time 16.3 (H) 11.4 - 15.2 seconds   INR 1.3 (H) 0.8 - 1.2    Comment: (NOTE) INR goal varies based on device and disease states. Performed at [facility], [address] [place], [place] [address]    CBC     Status: Abnormal   Collection Time: 02/20/21  5:31 AM  Result Value Ref Range   WBC 15.5 (H) 4.0 - 10.5 K/uL   RBC 3.12 (L) 3.87 - 5.11 MIL/uL   Hemoglobin 7.0 (L) 12.0 - 15.0 g/dL    Comment: Reticulocyte Hemoglobin testing may be clinically indicated, consider ordering this additional test [id number]    HCT 21.1 (L) 36.0 - 46.0 %   MCV 67.6 (L) 80.0 - 100.0 fL   MCH 22.4 (L) 26.0 - 34.0 pg   MCHC 33.2 30.0 - 36.0 g/dL   RDW 29.7 (H) 11.5 - 15.5 %   Platelets 355 150 - 400 K/uL   nRBC 0.1 0.0 - 0.2 %    Comment: Performed at [facility], [place], [place] [address]  Heparin level (unfractionated)     Status: None   Collection Time: 02/20/21  5:31 AM  Result Value Ref Range   Heparin Unfractionated 0.58 0.30 - 0.70 IU/mL    Comment: (NOTE) The clinical reportable range upper limit is being lowered to >1.10 to align with the FDA approved guidance for the current laboratory assay.  If heparin results are below expected values, and patient dosage has  been confirmed, suggest follow up testing of antithrombin III levels. Performed at [facility], [address] [place], [place] [address]   Prepare RBC (crossmatch)     Status: None   Collection Time: 02/20/21  7:56 AM  Result Value Ref Range   Order Confirmation      ORDER PROCESSED BY BLOOD BANK Performed at [facility], [address] [place], [place] [address]   ECHOCARDIOGRAM COMPLETE     Status: None   Collection Time: 02/20/21 12:24 PM  Result Value Ref Range   Weight 3,164.8 oz   Height 65 in   BP 114/78 mmHg   Ao pk vel 1.38 m/s   AR max vel 2.94 cm2   AV Peak grad 7.6 mmHg   S' Lateral 2.98 cm   Area-P 1/2 5.54 cm2  Comprehensive metabolic panel     Status: Abnormal   Collection Time: 02/20/21  3:58 PM  Result Value Ref Range   Sodium 137 135 - 145 mmol/L   Potassium 3.6 3.5 - 5.1 mmol/L   Chloride 105 98 - 111 mmol/L   CO2 28 22 - 32 mmol/L    Glucose, Bld 91 70 - 99 mg/dL    Comment: Glucose reference range applies only to samples taken after fasting for at least 8 hours.   BUN 9 6 - 20 mg/dL   Creatinine, Ser 0.53 0.44 - 1.00 mg/dL   Calcium 8.2 (L) 8.9 - 10.3 mg/dL   Total Protein 6.0 (L) 6.5 - 8.1 g/dL   Albumin 2.8 (L) 3.5 - 5.0 g/dL   AST 12 (L) 15 - 41 U/L   ALT 9 0 - 44 U/L   Alkaline Phosphatase 44 38 - 126 U/L   Total Bilirubin 0.6 0.3 - 1.2 mg/dL   GFR, Estimated >60 >60 mL/min    Comment: (NOTE) Calculated using the CKD-EPI Creatinine Equation (2021)    Anion gap 4 (L) 5 - 15    Comment: Performed at [facility], [address] [place], [place] [address]  Hemoglobin and hematocrit, blood     Status: Abnormal   Collection Time: 02/20/21  3:58 PM  Result Value Ref Range   Hemoglobin 7.7 (L) 12.0 - 15.0 g/dL   HCT 22.4 (L) 36.0 - 46.0 %    Comment: RESULT CALLED TO, READ BACK BY AND VERIFIED WITH: JULIESA,RN '@[id number]'  ON 07.02.22.SH Performed at [facility], [address] [place],  [address] CORRECTED ON 07/02 AT 1703: PREVIOUSLY REPORTED AS 17.7   Type and screen [place]     Status: None   Collection Time: 02/20/21  3:58 PM  Result Value Ref Range   ABO/RH(D) A POS    Antibody Screen POS    Sample Expiration 02/23/2021,2359    Antibody Identification ANTI K    Unit Number [id number]    Blood Component Type RED CELLS,LR    Unit division 00    Status of Unit REL FROM [facility]    Transfusion Status OK TO TRANSFUSE    Crossmatch Result COMPATIBLE    Unit Number [id number]    Blood Component Type RED CELLS,LR    Unit division 00    Status of Unit REL FROM [facility]    Transfusion Status OK TO TRANSFUSE    Crossmatch Result COMPATIBLE   Heparin level (unfractionated)     Status: None   Collection Time: 02/20/21  3:58 PM  Result Value Ref Range   Heparin Unfractionated 0.47 0.30 - 0.70 IU/mL    Comment: (NOTE) The clinical reportable range upper limit  is being lowered to >1.10 to align with the FDA approved guidance for the current laboratory assay.  If heparin results are below expected values, and patient dosage has  been confirmed, suggest follow up testing of antithrombin III levels. Performed at [facility], [place], [place] [address]   BPAM RBC     Status: None   Collection Time: 02/20/21  3:58 PM  Result Value Ref Range   Blood Product Unit Number [id number]    PRODUCT CODE [id number]    Unit Type and Rh 6200    Blood Product Expiration Date 496759163846    Blood Product Unit Number [id number]    PRODUCT CODE [id number]    Unit Type and Rh 6200    Blood Product Expiration Date 300762263335   Vitamin B12     Status: Abnormal   Collection Time: 02/20/21  4:03 PM  Result Value Ref Range   Vitamin B-12 178 (L) 180 - 914 pg/mL    Comment: (NOTE) This assay is not validated for testing neonatal or myeloproliferative syndrome specimens for Vitamin B12 levels. Performed at [facility], [address] [place], [place] [address]   CBC with Differential/Platelet     Status: Abnormal   Collection Time: 02/21/21  5:36 AM  Result Value Ref Range   WBC 12.5 (H) 4.0 - 10.5 K/uL   RBC 3.30 (L) 3.87 - 5.11 MIL/uL   Hemoglobin 7.6 (L) 12.0 - 15.0 g/dL    Comment: Reticulocyte Hemoglobin testing may be clinically indicated, consider ordering this additional test [id number]    HCT 22.9 (L) 36.0 - 46.0 %   MCV 69.4 (L) 80.0 - 100.0 fL   MCH 23.0 (L) 26.0 - 34.0 pg   MCHC 33.2 30.0 - 36.0 g/dL   RDW 29.8 (H) 11.5 - 15.5 %   Platelets 470 (H) 150 - 400 K/uL   nRBC 0.2 0.0 - 0.2 %   Neutrophils Relative % 67 %   Neutro Abs 8.4 (H) 1.7 - 7.7 K/uL   Lymphocytes Relative 23 %   Lymphs Abs 2.9 0.7 - 4.0 K/uL   Monocytes Relative 6 %   Monocytes Absolute 0.7 0.1 - 1.0 K/uL   Eosinophils Relative 3 %   Eosinophils Absolute 0.4 0.0 - 0.5 K/uL   Basophils Relative 0 %   Basophils Absolute 0.0 0.0 -  0.1 K/uL   WBC Morphology MORPHOLOGY UNREMARKABLE    RBC Morphology MIXED RBC MORPHOLOGY PRESENT    Smear Review Normal platelet morphology    Immature Granulocytes 1 %   Abs Immature Granulocytes 0.13 (H) 0.00 - 0.07 K/uL   Polychromasia PRESENT     Comment: Performed at [facility], [address] [place], [place] [address]  Comprehensive metabolic panel     Status: Abnormal   Collection Time: 02/21/21  5:36 AM  Result Value Ref Range   Sodium 139 135 - 145 mmol/L   Potassium 3.7 3.5 - 5.1 mmol/L   Chloride 104 98 - 111 mmol/L   CO2 29 22 - 32 mmol/L   Glucose, Bld 91 70 - 99 mg/dL    Comment: Glucose reference range applies only to samples taken after fasting for at least 8 hours.   BUN 10 6 - 20 mg/dL   Creatinine, Ser 0.62 0.44 - 1.00 mg/dL   Calcium 8.4 (  L) 8.9 - 10.3 mg/dL   Total Protein 6.0 (L) 6.5 - 8.1 g/dL   Albumin 2.7 (L) 3.5 - 5.0 g/dL   AST 13 (L) 15 - 41 U/L   ALT 9 0 - 44 U/L   Alkaline Phosphatase 45 38 - 126 U/L   Total Bilirubin 0.5 0.3 - 1.2 mg/dL   GFR, Estimated >60 >60 mL/min    Comment: (NOTE) Calculated using the CKD-EPI Creatinine Equation (2021)    Anion gap 6 5 - 15    Comment: Performed at [facility], [address], [place], [place] [address]  Magnesium     Status: None   Collection Time: 02/21/21  5:36 AM  Result Value Ref Range   Magnesium 1.8 1.7 - 2.4 mg/dL    Comment: Performed at [facility], [address], [place], [place] [address]  Phosphorus     Status: None   Collection Time: 02/21/21  5:36 AM  Result Value Ref Range   Phosphorus 3.2 2.5 - 4.6 mg/dL    Comment: Performed at [facility], [place], [place] [address]  Heparin level (unfractionated)     Status: None   Collection Time: 02/21/21  5:36 AM  Result Value Ref Range   Heparin Unfractionated 0.41 0.30 - 0.70 IU/mL    Comment: (NOTE) The clinical reportable range upper limit is being lowered to >1.10 to align  with the FDA approved guidance for the current laboratory assay.  If heparin results are below expected values, and patient dosage has  been confirmed, suggest follow up testing of antithrombin III levels. Performed at [facility], [address] [place], [place] [address]   Iron and TIBC     Status: Abnormal   Collection Time: 02/21/21  5:36 AM  Result Value Ref Range   Iron 26 (L) 28 - 170 ug/dL   TIBC 319 250 - 450 ug/dL   Saturation Ratios 8 (L) 10.4 - 31.8 %   UIBC 293 ug/dL    Comment: Performed at [facility], [address] [place], [place] [address]  Ferritin     Status: None   Collection Time: 02/21/21  5:36 AM  Result Value Ref Range   Ferritin 31 11 - 307 ng/mL    Comment: Performed at [facility], [address] [place], [place] [address]  Folate     Status: None   Collection Time: 02/21/21  5:36 AM  Result Value Ref Range   Folate 7.4 >5.9 ng/mL    Comment: Performed at [facility], [address] [place], [place] [address]  Hemoglobin and hematocrit, blood     Status: Abnormal   Collection Time: 02/21/21  4:36 PM  Result Value Ref Range   Hemoglobin 7.6 (L) 12.0 - 15.0 g/dL   HCT 22.5 (L) 36.0 - 46.0 %    Comment: Performed at [facility], [place], [place] [address]  Heparin level (unfractionated)     Status: None   Collection Time: 02/22/21  6:21 AM  Result Value Ref Range   Heparin Unfractionated 0.39 0.30 - 0.70 IU/mL    Comment: (NOTE) The clinical reportable range upper limit is being lowered to >1.10 to align with the FDA approved guidance for the current laboratory assay.  If heparin results are below expected values, and patient dosage has  been confirmed, suggest follow up testing of antithrombin III levels. Performed at [facility], [address] [place],  [place] [address]   CBC with Differential/Platelet     Status: Abnormal   Collection Time: 02/22/21  6:21  AM  Result Value Ref Range   WBC 8.9 4.0 - 10.5 K/uL   RBC 3.41 (L) 3.87 - 5.11 MIL/uL   Hemoglobin 7.6 (L) 12.0 - 15.0 g/dL    Comment: Reticulocyte Hemoglobin testing may be clinically indicated, consider ordering this additional test [id number]    HCT 23.5 (L) 36.0 - 46.0 %   MCV 68.9 (L) 80.0 - 100.0 fL   MCH 22.3 (L) 26.0 - 34.0 pg   MCHC 32.3 30.0 - 36.0 g/dL    Comment: CORRECTED FOR COLD AGGLUTININS   RDW 29.6 (H) 11.5 - 15.5 %   Platelets 459 (H) 150 - 400 K/uL   nRBC 0.6 (H) 0.0 - 0.2 %   Neutrophils Relative % 65 %   Neutro Abs 5.8 1.7 - 7.7 K/uL   Lymphocytes Relative 22 %   Lymphs Abs 1.9 0.7 - 4.0 K/uL   Monocytes Relative 8 %   Monocytes Absolute 0.7 0.1 - 1.0 K/uL   Eosinophils Relative 3 %   Eosinophils Absolute 0.2 0.0 - 0.5 K/uL   Basophils Relative 0 %   Basophils Absolute 0.0 0.0 - 0.1 K/uL   WBC Morphology MORPHOLOGY UNREMARKABLE    Smear Review Normal platelet morphology     Comment: PLATELET COUNT CONFIRMED BY SMEAR   Immature Granulocytes 2 %   Abs Immature Granulocytes 0.13 (H) 0.00 - 0.07 K/uL   Dimorphism PRESENT    Polychromasia PRESENT     Comment: Performed at [facility], [address] [place], [place] [address]  Comprehensive metabolic panel     Status: Abnormal   Collection Time: 02/22/21  6:21 AM  Result Value Ref Range   Sodium 137 135 - 145 mmol/L   Potassium 3.7 3.5 - 5.1 mmol/L   Chloride 105 98 - 111 mmol/L   CO2 26 22 - 32 mmol/L   Glucose, Bld 99 70 - 99 mg/dL    Comment: Glucose reference range applies only to samples taken after fasting for at least 8 hours.   BUN 8 6 - 20 mg/dL   Creatinine, Ser 0.58 0.44 - 1.00 mg/dL   Calcium 8.5 (L) 8.9 - 10.3 mg/dL   Total Protein 6.1 (L) 6.5 - 8.1 g/dL   Albumin 2.7 (L) 3.5 - 5.0 g/dL   AST 23 15 - 41 U/L   ALT 15 0 - 44 U/L   Alkaline Phosphatase 59 38 - 126 U/L   Total Bilirubin 0.7 0.3 - 1.2 mg/dL   GFR, Estimated >60 >60 mL/min    Comment: (NOTE) Calculated using  the CKD-EPI Creatinine Equation (2021)    Anion gap 6 5 - 15    Comment: Performed at [facility], [address] [place], [place] [address]  Magnesium     Status: None   Collection Time: 02/22/21  6:21 AM  Result Value Ref Range   Magnesium 1.9 1.7 - 2.4 mg/dL    Comment: Performed at [facility], [address] [place], [place] [address]  Phosphorus     Status: None   Collection Time: 02/22/21  6:21 AM  Result Value Ref Range   Phosphorus 2.9 2.5 - 4.6 mg/dL    Comment: Performed at [facility], [address] [place],  [address]  CBC     Status: Abnormal   Collection Time: 02/22/21  8:48 AM  Result Value  Ref Range   WBC 8.6 4.0 - 10.5 K/uL   RBC 3.39 (L) 3.87 - 5.11 MIL/uL   Hemoglobin 8.1 (L) 12.0 - 15.0 g/dL    Comment: Reticulocyte Hemoglobin testing may be clinically indicated, consider ordering this additional test [id number]    HCT 23.9 (L) 36.0 - 46.0 %   MCV 70.5 (L) 80.0 - 100.0 fL   MCH 23.9 (L) 26.0 - 34.0 pg   MCHC 33.9 30.0 - 36.0 g/dL    Comment: CORRECTED FOR COLD AGGLUTININS   RDW 29.3 (H) 11.5 - 15.5 %   Platelets 363 150 - 400 K/uL   nRBC 0.4 (H) 0.0 - 0.2 %    Comment: Performed at [facility], [address] [place], [place] [address]  CBC with Differential/Platelet     Status: Abnormal   Collection Time: 03/02/21  3:10 PM  Result Value Ref Range   WBC 12.7 (H) 3.8 - 10.8 Thousand/uL   RBC 3.87 3.80 - 5.10 Million/uL   Hemoglobin 8.8 (L) 11.7 - 15.5 g/dL   HCT 28.5 (L) 35.0 - 45.0 %   MCV 73.6 (L) 80.0 - 100.0 fL   MCH 22.7 (L) 27.0 - 33.0 pg   MCHC 30.9 (L) 32.0 - 36.0 g/dL   RDW 31.0 (H) 11.0 - 15.0 %   Platelets 394 140 - 400 Thousand/uL   MPV 9.9 7.5 - 12.5 fL   Neutro Abs 9,068 (H) 1,500 - 7,800 cells/uL   Lymphs Abs 2,286 850 - 3,900 cells/uL   Absolute Monocytes 838 200 - 950 cells/uL   Eosinophils Absolute 432 15 - 500 cells/uL   Basophils Absolute 76 0 - 200 cells/uL   Neutrophils  Relative % 71.4 %   Total Lymphocyte 18.0 %   Monocytes Relative 6.6 %   Eosinophils Relative 3.4 %   Basophils Relative 0.6 %   Smear Review      Comment: Leukoerythroblastic blood film with immature granulocytic cells, NRBCs and abnormal RBC morphology. Primary hematologic disorder not excluded. Suggest full hematologic evaluation if clinically indicated. Platelet clumps noted on smear-count appears increased. Reviewed by Francis Gaines Rockne Coons, MD  (Electronic Signature on File)     [id number] Final report pending pathologist review   COMPLETE METABOLIC PANEL WITH GFR     Status: None   Collection Time: 03/02/21  3:10 PM  Result Value Ref Range   Glucose, Bld 96 65 - 99 mg/dL    Comment: .            Fasting reference interval .    BUN 7 7 - 25 mg/dL   Creat 0.82 0.50 - 0.99 mg/dL   eGFR 91 > OR = 60 mL/min/1.38m    Comment: The eGFR is based on the CKD-EPI 2021 equation. To calculate  the new eGFR from a previous Creatinine or Cystatin C result, go to https://www.kidney.org/professionals/ kdoqi/gfr%5Fcalculator    BUN/Creatinine Ratio NOT APPLICABLE 6 - 22 (calc)   Sodium 138 135 - 146 mmol/L   Potassium 4.4 3.5 - 5.3 mmol/L   Chloride 104 98 - 110 mmol/L   CO2 27 20 - 32 mmol/L   Calcium 8.9 8.6 - 10.2 mg/dL   Total Protein 7.2 6.1 - 8.1 g/dL   Albumin 3.8 3.6 - 5.1 g/dL   Globulin 3.4 1.9 - 3.7 g/dL (calc)   AG Ratio 1.1 1.0 - 2.5 (calc)   Total Bilirubin 0.5 0.2 - 1.2 mg/dL   Alkaline phosphatase (APISO) 84 31 - 125 U/L  AST 10 10 - 30 U/L   ALT 9 6 - 29 U/L    Diabetic Foot Exam: Diabetic Foot Exam - Simple   No data filed    ***  Fall Risk: Fall Risk  03/02/2021 08/25/2020 04/15/2020 02/13/2020 01/15/2020  Falls in the past year? 0 0 - 0 0  Number falls in past yr: 0 0 1 0 0  Injury with Fall? 0 0 0 0 0  Risk for fall due to : - - History of fall(s);Impaired balance/gait - -  Follow up - - - - Falls evaluation completed   ***  Functional Status  Survey:   ***  Assessment & Plan  1. Well adult exam ***   -USPSTF grade A and B recommendations reviewed with patient; age-appropriate recommendations, preventive care, screening tests, etc discussed and encouraged; healthy living encouraged; see AVS for patient education given to patient -Discussed importance of 150 minutes of physical activity weekly, eat two servings of fish weekly, eat one serving of tree nuts ( cashews, pistachios, pecans, almonds.Marland Kitchen) every other day, eat 6 servings of fruit/vegetables daily and drink plenty of water and avoid sweet beverages.   -Reviewed Health Maintenance: ***

## 2021-03-25 ENCOUNTER — Encounter: Payer: 59 | Admitting: Family Medicine

## 2021-03-25 DIAGNOSIS — Z0000 Encounter for general adult medical examination without abnormal findings: Secondary | ICD-10-CM

## 2021-04-14 NOTE — Progress Notes (Signed)
Name: Sharon Herring   MRN: [id number]    DOB: Sep 30, 1975   Date:04/15/2021       Progress Note  Subjective  Chief Complaint  Annual Exam  HPI  Patient presents for annual CPE.  Diet: She eats in moderation, she is cutting back on sugar.  Diet includes dairy, fruits, vegetables, fish, and chicken.  Exercise: Walking about thirty minutes, three days a week  Flowsheet Row Video Visit from 08/25/2020 in [facility]  AUDIT-C Score 0      Depression: Phq 9 is  negative Depression screen [facility] 2/9 04/15/2021 03/02/2021 08/25/2020 04/15/2020 02/13/2020  Decreased Interest 0 0 0 1 0  Down, Depressed, Hopeless 0 0 0 1 0  PHQ - 2 Score 0 0 0 2 0  Altered sleeping 0 - - 1 0  Tired, decreased energy 0 - - 3 0  Change in appetite 0 - - 1 0  Feeling bad or failure about yourself  0 - - 0 0  Trouble concentrating 0 - - 0 0  Moving slowly or fidgety/restless 0 - - 0 0  Suicidal thoughts 0 - - 0 0  PHQ-9 Score 0 - - 7 0  Difficult doing work/chores - - - Somewhat difficult -  Some recent data might be hidden   Hypertension: BP Readings from Last 3 Encounters:  04/15/21 118/72  03/23/21 107/77  03/02/21 120/72   Obesity: Wt Readings from Last 3 Encounters:  04/15/21 184 lb (83.5 kg)  03/23/21 190 lb 1.6 oz (86.2 kg)  03/02/21 187 lb (84.8 kg)   BMI Readings from Last 3 Encounters:  04/15/21 30.62 kg/m  03/23/21 31.63 kg/m  03/02/21 31.12 kg/m     Vaccines:   Pneumonia: educated and discussed with patient. Flu: educated and discussed with patient.  Hep C Screening: 01/22/19 STD testing and prevention (HIV/chl/gon/syphilis): 01/22/19 Intimate partner violence:negative Sexual History : one partner, no pain or discomfort  Menstrual History/LMP/Abnormal Bleeding: monthly regular menstrual cycle, LMP was 03/17/2021  Incontinence Symptoms: no problems   Breast cancer:  - Last Mammogram: 2019, it was category 4, saw Dr. Fleet Contras and had repeat US as outpatient and  was given reassurance ( per patient) we will repeat diagnostic studies today  - BRCA gene screening: N/A  Osteoporosis: Discussed high calcium and vitamin D supplementation, weight bearing exercises  Cervical cancer screening: 01/22/19  Skin cancer: Discussed monitoring for atypical lesions  Colorectal cancer: N/A   Lung cancer: Low Dose CT Chest recommended if Age 43-80 years, 20 pack-year currently smoking OR have quit w/in 15years. Patient does not qualify.   ECG: 02/20/21  Advanced Care Planning: A voluntary discussion about advance care planning including the explanation and discussion of advance directives.  Discussed health care proxy and Living will, and the patient was able to identify a health care proxy as  husband   Lipids: Lab Results  Component Value Date   CHOL 171 09/25/2019   CHOL 171 01/22/2019   CHOL 170 12/14/2017   Lab Results  Component Value Date   HDL 41 09/25/2019   HDL 35 (L) 01/22/2019   HDL 41 (L) 12/14/2017   Lab Results  Component Value Date   LDLCALC 114 (H) 09/25/2019   LDLCALC 114 (H) 01/22/2019   LDLCALC 114 (H) 12/14/2017   Lab Results  Component Value Date   TRIG 86 09/25/2019   TRIG 108 01/22/2019   TRIG 63 12/14/2017   Lab Results  Component Value Date   CHOLHDL  4.2 09/25/2019   CHOLHDL 4.9 01/22/2019   CHOLHDL 4.1 12/14/2017   No results found for: LDLDIRECT  Glucose: Glucose, Bld  Date Value Ref Range Status  03/02/2021 96 65 - 99 mg/dL Final    Comment:    .            Fasting reference interval .   02/22/2021 99 70 - 99 mg/dL Final    Comment:    Glucose reference range applies only to samples taken after fasting for at least 8 hours.  02/21/2021 91 70 - 99 mg/dL Final    Comment:    Glucose reference range applies only to samples taken after fasting for at least 8 hours.    Patient Active Problem List   Diagnosis Date Noted   History of blood transfusion 02/26/2021   Acute pulmonary embolism (HCC)    S/P  myomectomy 02/19/2021   Anemia associated with acute blood loss 02/17/2021   Symptomatic anemia 10/26/2020   Lightheadedness 04/15/2020   Liver lesion 01/15/2020   Hiatal hernia 01/15/2020   Lymphadenopathy 01/16/2018   Moderate obstructive sleep apnea 12/19/2017   Swelling of limb 01/27/2017   B12 deficiency 03/28/2016   Vitamin D deficiency 03/28/2016   Hyperglycemia 03/28/2016   Multiple gastric polyps 01/08/2016   Menorrhagia 11/13/2015   Chronic constipation 09/23/2015   Obesity, Class I, BMI 30.0-34.9 (see actual BMI) 06/02/2015   Iron deficiency anemia 03/14/2014   History of uterine fibroid 03/14/2014    Past Surgical History:  Procedure Laterality Date   APPENDECTOMY  2012   CHOLECYSTECTOMY     CHROMOPERTUBATION N/A 02/18/2021   Procedure: CHROMOPERTUBATION;  Surgeon: Rubie Maid, MD;  Location: [facility];  Service: Gynecology;  Laterality: N/A;   ESOPHAGOGASTRODUODENOSCOPY (EGD) WITH PROPOFOL N/A 01/06/2016   Procedure: ESOPHAGOGASTRODUODENOSCOPY (EGD) WITH PROPOFOL;  Surgeon: Robert Bellow, MD;  Location: [facility];  Service: Endoscopy;  Laterality: N/A;   HERNIA REPAIR     ROBOT ASSISTED MYOMECTOMY N/A 02/18/2021   Procedure: XI ROBOTIC ASSISTED MYOMECTOMY;  Surgeon: Rubie Maid, MD;  Location: [facility];  Service: Gynecology;  Laterality: N/A;   ROBOTIC ASSISTED LAPAROSCOPIC CHOLECYSTECTOMY N/A 03/03/2016   Procedure: ROBOTIC ASSISTED LAPAROSCOPIC CHOLECYSTECTOMY ;  Surgeon: Clayburn Pert, MD;  Location: [facility];  Service: General;  Laterality: N/A;   UTERINE FIBROID SURGERY  2012   XI ROBOTIC ASSISTED VENTRAL HERNIA N/A 07/15/2019   Procedure: XI ROBOTIC ASSISTED VENTRAL HERNIA;  Surgeon: Jules Husbands, MD;  Location: [facility];  Service: General;  Laterality: N/A;    Family History  Problem Relation Age of Onset   Hypertension Mother    Hypertension Father    Pancreatic cancer Father 8   Breast cancer Neg Hx    Colon cancer Neg Hx     Social  History   Socioeconomic History   Marital status: Married    Spouse name: Not on file   Number of children: 0   Years of education: Not on file   Highest education level: Bachelor's degree (e.g., BA, AB, BS)  Occupational History   Occupation: CMA  Tobacco Use   Smoking status: Never   Smokeless tobacco: Never  Vaping Use   Vaping Use: Never used  Substance and Sexual Activity   Alcohol use: No    Alcohol/week: 0.0 standard drinks   Drug use: No   Sexual activity: Not Currently    Partners: Male    Birth control/protection: None  Other Topics Concern   Not on file  Social History Narrative   Lives alone, works at [employer] Strain: Low Risk    Difficulty of Paying Living Expenses: Not hard at all  Food Insecurity: No Food Insecurity   Worried About Charity fundraiser in the Last Year: Never true   Arboriculturist in the Last Year: Never true  Transportation Needs: No Transportation Needs   Lack of Transportation (Medical): No   Lack of Transportation (Non-Medical): No  Physical Activity: Insufficiently Active   Days of Exercise per Week: 2 days   Minutes of Exercise per Session: 30 min  Stress: No Stress Concern Present   Feeling of Stress : Only a little  Social Connections: Engineer, building services of Communication with Friends and Family: More than three times a week   Frequency of Social Gatherings with Friends and Family: More than three times a week   Attends Religious Services: More than 4 times per year   Active Member of [employer] or Organizations: Yes   Attends Archivist Meetings: 1 to 4 times per year   Marital Status: Married  Human resources officer Violence: Not At Risk   Fear of Current or Ex-Partner: No   Emotionally Abused: No   Physically Abused: No   Sexually Abused: No     Current Outpatient Medications:    albuterol (PROVENTIL) (2.5 MG/3ML) 0.083% nebulizer solution, Take 3  mLs (2.5 mg total) by nebulization every 6 (six) hours as needed for wheezing or shortness of breath., Disp: 150 mL, Rfl: 1   albuterol (VENTOLIN HFA) 108 (90 Base) MCG/ACT inhaler, Inhale 2 puffs into the lungs every 6 (six) hours as needed for wheezing or shortness of breath., Disp: 18 g, Rfl: 0   apixaban (ELIQUIS) 5 MG TABS tablet, Take 1 tablet (5 mg total) by mouth 2 (two) times daily., Disp: 60 tablet, Rfl: 4   docusate sodium (COLACE) 100 MG capsule, Take 1 capsule (100 mg total) by mouth 2 (two) times daily., Disp: 10 capsule, Rfl: 0   ferrous sulfate (FERROUSUL) 325 (65 FE) MG tablet, Take 1 tablet (325 mg total) by mouth 3 (three) times daily with meals., Disp: 90 tablet, Rfl: 1   fluticasone (FLONASE) 50 MCG/ACT nasal spray, Place 2 sprays into both nostrils daily. (Patient taking differently: Place 2 sprays into both nostrils daily as needed for allergies.), Disp: 16 g, Rfl: 6   furosemide (LASIX) 20 MG tablet, Take 1 tablet (20 mg total) by mouth daily. (Patient taking differently: Take 20 mg by mouth daily as needed for fluid.), Disp: 30 tablet, Rfl: 1   leuprolide (LUPRON DEPOT, 31-MONTH,) 11.25 MG injection, Inject 11.25 mg into the muscle every 3 (three) months., Disp: 1 each, Rfl: 1   linaclotide (LINZESS) 290 MCG CAPS capsule, Take 1 capsule (290 mcg total) by mouth daily before breakfast., Disp: 30 capsule, Rfl: 2   Multiple Vitamins-Minerals (MULTIVITAMIN ADULT) TABS, Take 1 tablet by mouth daily., Disp: 30 tablet, Rfl: 0   acetaminophen (TYLENOL) 500 MG tablet, Take 2 tablets (1,000 mg total) by mouth every 6 (six) hours. (Patient not taking: Reported on 04/15/2021), Disp: 30 tablet, Rfl: 0   hydrocortisone cream 0.5 %, Apply 1 application topically 2 (two) times daily as needed for itching. (Patient not taking: Reported on 04/15/2021), Disp: 30 g, Rfl: 0   HYDROmorphone (DILAUDID) 2 MG tablet, Take 1 tablet (2 mg total) by mouth every 4 (four) hours as needed  for moderate pain or  severe pain (may take up to 2 tablets every 6 hours for severe pain). (Patient not taking: Reported on 04/15/2021), Disp: 20 tablet, Rfl: 0   triamcinolone cream (KENALOG) 0.1 %, Apply 1 application topically 2 (two) times daily. (Patient not taking: Reported on 04/15/2021), Disp: 45 g, Rfl: 0  Allergies  Allergen Reactions   Aspirin Nausea And Vomiting   Oxycodone-Acetaminophen Rash and Shortness Of Breath   Lexapro [Escitalopram] Palpitations   Tape Other (See Comments)    Burning feeling. Has left mark on skin.   Belviq [Lorcaserin Hcl] Other (See Comments)    headache   Dicyclomine Nausea And Vomiting   Contrave [Naltrexone-Bupropion Hcl Er] Anxiety    Altered mental state   Norethindrone Rash     ROS  Constitutional: Negative for fever or weight change.  Respiratory: Negative for cough but has intermittent  shortness of breath when she skips anticoagulant .   Cardiovascular: Negative for chest pain or palpitations.  Gastrointestinal: Negative for abdominal pain, no bowel changes.  Musculoskeletal: Negative for gait problem or joint swelling.  Skin: Negative for rash.  Neurological: Negative for dizziness or headache.  No other specific complaints in a complete review of systems (except as listed in HPI above).   Objective  Vitals:   04/15/21 1054  BP: 118/72  Pulse: 96  Resp: 16  Temp: 98.1 F (36.7 C)  SpO2: 99%  Weight: 184 lb (83.5 kg)  Height: '5\' 5"'  (1.651 m)    Body mass index is 30.62 kg/m.  Physical Exam  Constitutional: Patient appears well-developed and obese . No distress.  HENT: Head: Normocephalic and atraumatic. Ears: B TMs ok, no erythema or effusion; Nose: Nose normal. Mouth/Throat:not done  Eyes: Conjunctivae and EOM are normal. Pupils are equal, round, and reactive to light. No scleral icterus.  Neck: Normal range of motion. Neck supple. No JVD present. No thyromegaly present.  Cardiovascular: Normal rate, regular rhythm and normal heart  sounds.  No murmur heard. No BLE edema. Pulmonary/Chest: Effort normal and breath sounds normal. No respiratory distress. Abdominal: Soft. Bowel sounds are normal, no distension. There is no tenderness. no masses Breast: large mass at 12 o'clock  left breast and also smaller , firm at 1 o'clock right breast  no nipple discharge or rashes FEMALE GENITALIA:  Not done  RECTAL: not done  Musculoskeletal: Normal range of motion, no joint effusions. No gross deformities Neurological: he is alert and oriented to person, place, and time. No cranial nerve deficit. Coordination, balance, strength, speech and gait are normal.  Skin: Skin is warm and dry. No rash noted. No erythema. She has a recent burn on right forehead from curling iron  Psychiatric: Patient has a normal mood and affect. behavior is normal. Judgment and thought content normal.     Fall Risk: Fall Risk  04/15/2021 03/02/2021 08/25/2020 04/15/2020 02/13/2020  Falls in the past year? 0 0 0 - 0  Number falls in past yr: 0 0 0 1 0  Injury with Fall? 0 0 0 0 0  Risk for fall due to : No Fall Risks - - History of fall(s);Impaired balance/gait -  Follow up Falls prevention discussed - - - -     Functional Status Survey: Is the patient deaf or have difficulty hearing?: No Does the patient have difficulty seeing, even when wearing glasses/contacts?: No Does the patient have difficulty concentrating, remembering, or making decisions?: No Does the patient have difficulty walking or climbing stairs?: No  Does the patient have difficulty dressing or bathing?: No Does the patient have difficulty doing errands alone such as visiting a doctor's office or shopping?: No   Assessment & Plan  1. Well adult exam  - CBC with Differential/Platelet - Hemoglobin A1c - Iron, TIBC and Ferritin Panel - Lipid panel  2. Abnormal mammogram of both breasts  - MM Digital Diagnostic Bilat; Future - US BREAST LTD UNI LEFT INC AXILLA; Future - US BREAST  LTD UNI RIGHT INC AXILLA; Future  3. Iron deficiency anemia due to chronic blood loss  - CBC with Differential/Platelet - Iron, TIBC and Ferritin Panel  4. B12 deficiency  - cyanocobalamin ((VITAMIN B-12)) injection 1,000 mcg  5. Long-term use of high-risk medication   6. Diabetes mellitus screening  - Hemoglobin A1c  7. Lipid screening  - Lipid panel  8. Missed period  - hCG, quantitative, pregnancy    -USPSTF grade A and B recommendations reviewed with patient; age-appropriate recommendations, preventive care, screening tests, etc discussed and encouraged; healthy living encouraged; see AVS for patient education given to patient -Discussed importance of 150 minutes of physical activity weekly, eat two servings of fish weekly, eat one serving of tree nuts ( cashews, pistachios, pecans, almonds.Marland Kitchen) every other day, eat 6 servings of fruit/vegetables daily and drink plenty of water and avoid sweet beverages.

## 2021-04-14 NOTE — Patient Instructions (Signed)
Preventive Care 70-44 Years Old, Female Preventive care refers to lifestyle choices and visits with your health care provider that can promote health and wellness. This includes: A yearly physical exam. This is also called an annual wellness visit. Regular dental and eye exams. Immunizations. Screening for certain conditions. Healthy lifestyle choices, such as: Eating a healthy diet. Getting regular exercise. Not using drugs or products that contain nicotine and tobacco. Limiting alcohol use. What can I expect for my preventive care visit? Physical exam Your health care provider will check your: Height and weight. These may be used to calculate your BMI (body mass index). BMI is a measurement that tells if you are at a healthy weight. Heart rate and blood pressure. Body temperature. Skin for abnormal spots. Counseling Your health care provider may ask you questions about your: Past medical problems. Family's medical history. Alcohol, tobacco, and drug use. Emotional well-being. Home life and relationship well-being. Sexual activity. Diet, exercise, and sleep habits. Work and work Statistician. Access to firearms. Method of birth control. Menstrual cycle. Pregnancy history. What immunizations do I need?  Vaccines are usually given at various ages, according to a schedule. Your health care provider will recommend vaccines for you based on your age, medicalhistory, and lifestyle or other factors, such as travel or where you work. What tests do I need? Blood tests Lipid and cholesterol levels. These may be checked every 5 years, or more often if you are over 16 years old. Hepatitis C test. Hepatitis B test. Screening Lung cancer screening. You may have this screening every year starting at age 36 if you have a 30-pack-year history of smoking and currently smoke or have quit within the past 15 years. Colorectal cancer screening. All adults should have this screening starting at  age 63 and continuing until age 75. Your health care provider may recommend screening at age 66 if you are at increased risk. You will have tests every 1-10 years, depending on your results and the type of screening test. Diabetes screening. This is done by checking your blood sugar (glucose) after you have not eaten for a while (fasting). You may have this done every 1-3 years. Mammogram. This may be done every 1-2 years. Talk with your health care provider about when you should start having regular mammograms. This may depend on whether you have a family history of breast cancer. BRCA-related cancer screening. This may be done if you have a family history of breast, ovarian, tubal, or peritoneal cancers. Pelvic exam and Pap test. This may be done every 3 years starting at age 83. Starting at age 66, this may be done every 5 years if you have a Pap test in combination with an HPV test. Other tests STD (sexually transmitted disease) testing, if you are at risk. Bone density scan. This is done to screen for osteoporosis. You may have this scan if you are at high risk for osteoporosis. Talk with your health care provider about your test results, treatment options,and if necessary, the need for more tests. Follow these instructions at home: Eating and drinking  Eat a diet that includes fresh fruits and vegetables, whole grains, lean protein, and low-fat dairy products. Take vitamin and mineral supplements as recommended by your health care provider. Do not drink alcohol if: Your health care provider tells you not to drink. You are pregnant, may be pregnant, or are planning to become pregnant. If you drink alcohol: Limit how much you have to 0-1 drink a day. Be aware  of how much alcohol is in your drink. In the [place] one drink equals one 12 oz bottle of beer (355 mL), one 5 oz glass of wine (148 mL), or one 1 oz glass of hard liquor (44 mL).  Lifestyle Take daily care of your teeth and  gums. Brush your teeth every morning and night with fluoride toothpaste. Floss one time each day. Stay active. Exercise for at least 30 minutes 5 or more days each week. Do not use any products that contain nicotine or tobacco, such as cigarettes, e-cigarettes, and chewing tobacco. If you need help quitting, ask your health care provider. Do not use drugs. If you are sexually active, practice safe sex. Use a condom or other form of protection to prevent STIs (sexually transmitted infections). If you do not wish to become pregnant, use a form of birth control. If you plan to become pregnant, see your health care provider for a prepregnancy visit. If told by your health care provider, take low-dose aspirin daily starting at age 29. Find healthy ways to cope with stress, such as: Meditation, yoga, or listening to music. Journaling. Talking to a trusted person. Spending time with friends and family. Safety Always wear your seat belt while driving or riding in a vehicle. Do not drive: If you have been drinking alcohol. Do not ride with someone who has been drinking. When you are tired or distracted. While texting. Wear a helmet and other protective equipment during sports activities. If you have firearms in your house, make sure you follow all gun safety procedures. What's next? Visit your health care provider once a year for an annual wellness visit. Ask your health care provider how often you should have your eyes and teeth checked. Stay up to date on all vaccines. This information is not intended to replace advice given to you by your health care provider. Make sure you discuss any questions you have with your healthcare provider. Document Revised: 05/12/2020 Document Reviewed: 04/19/2018 Elsevier Patient Education  2022 [employer].

## 2021-04-15 ENCOUNTER — Ambulatory Visit (INDEPENDENT_AMBULATORY_CARE_PROVIDER_SITE_OTHER): Payer: Self-pay | Admitting: Family Medicine

## 2021-04-15 ENCOUNTER — Encounter: Payer: Self-pay | Admitting: Hematology and Oncology

## 2021-04-15 ENCOUNTER — Other Ambulatory Visit: Payer: Self-pay

## 2021-04-15 ENCOUNTER — Encounter: Payer: Self-pay | Admitting: Family Medicine

## 2021-04-15 VITALS — BP 118/72 | HR 96 | Temp 98.1°F | Resp 16 | Ht 65.0 in | Wt 184.0 lb

## 2021-04-15 DIAGNOSIS — D500 Iron deficiency anemia secondary to blood loss (chronic): Secondary | ICD-10-CM

## 2021-04-15 DIAGNOSIS — Z131 Encounter for screening for diabetes mellitus: Secondary | ICD-10-CM

## 2021-04-15 DIAGNOSIS — Z13220 Encounter for screening for lipoid disorders: Secondary | ICD-10-CM

## 2021-04-15 DIAGNOSIS — Z79899 Other long term (current) drug therapy: Secondary | ICD-10-CM

## 2021-04-15 DIAGNOSIS — E538 Deficiency of other specified B group vitamins: Secondary | ICD-10-CM

## 2021-04-15 DIAGNOSIS — R928 Other abnormal and inconclusive findings on diagnostic imaging of breast: Secondary | ICD-10-CM

## 2021-04-15 DIAGNOSIS — Z0000 Encounter for general adult medical examination without abnormal findings: Secondary | ICD-10-CM

## 2021-04-15 DIAGNOSIS — N926 Irregular menstruation, unspecified: Secondary | ICD-10-CM

## 2021-04-15 MED ORDER — CYANOCOBALAMIN 1000 MCG/ML IJ SOLN: 1000.0000 ug | Freq: Once | INTRAMUSCULAR | Status: AC

## 2021-04-16 LAB — CBC WITH DIFFERENTIAL/PLATELET
Absolute Monocytes: 406 cells/uL (ref 200–950)
Basophils Absolute: 10 cells/uL (ref 0–200)
Basophils Relative: 0.3 %
Eosinophils Absolute: 20 cells/uL (ref 15–500)
Eosinophils Relative: 0.6 %
HCT: 28.9 % — ABNORMAL LOW (ref 35.0–45.0)
Hemoglobin: 8.1 g/dL — ABNORMAL LOW (ref 11.7–15.5)
Lymphs Abs: 1191 cells/uL (ref 850–3900)
MCH: 20.4 pg — ABNORMAL LOW (ref 27.0–33.0)
MCHC: 28 g/dL — ABNORMAL LOW (ref 32.0–36.0)
MCV: 72.8 fL — ABNORMAL LOW (ref 80.0–100.0)
MPV: 9.5 fL (ref 7.5–12.5)
Monocytes Relative: 12.3 %
Neutro Abs: 1673 cells/uL (ref 1500–7800)
Neutrophils Relative %: 50.7 %
Platelets: 595 10*3/uL — ABNORMAL HIGH (ref 140–400)
RBC: 3.97 10*6/uL (ref 3.80–5.10)
RDW: 25.3 % — ABNORMAL HIGH (ref 11.0–15.0)
Total Lymphocyte: 36.1 %
WBC: 3.3 10*3/uL — ABNORMAL LOW (ref 3.8–10.8)

## 2021-04-16 LAB — HEMOGLOBIN A1C
Hgb A1c MFr Bld: 5 % of total Hgb (ref <5.7)
Mean Plasma Glucose: 97 mg/dL
eAG (mmol/L): 5.4 mmol/L

## 2021-04-16 LAB — IRON,TIBC AND FERRITIN PANEL
%SAT: 3 % (calc) — ABNORMAL LOW (ref 16–45)
Ferritin: 4 ng/mL — ABNORMAL LOW (ref 16–232)
Iron: 12 ug/dL — ABNORMAL LOW (ref 40–190)
TIBC: 446 mcg/dL (calc) (ref 250–450)

## 2021-04-16 LAB — HCG, QUANTITATIVE, PREGNANCY: HCG, Total, QN: <3 m[IU]/mL

## 2021-04-16 LAB — LIPID PANEL
Cholesterol: 168 mg/dL (ref <200)
HDL: 41 mg/dL — ABNORMAL LOW (ref >=50)
LDL Cholesterol (Calc): 113 mg/dL (calc) — ABNORMAL HIGH
Non-HDL Cholesterol (Calc): 127 mg/dL (calc) (ref <130)
Total CHOL/HDL Ratio: 4.1 (calc) (ref <5.0)
Triglycerides: 62 mg/dL (ref <150)

## 2021-04-16 LAB — CBC MORPHOLOGY

## 2021-04-18 ENCOUNTER — Other Ambulatory Visit: Payer: Self-pay | Admitting: Obstetrics and Gynecology

## 2021-04-18 ENCOUNTER — Encounter: Payer: Self-pay | Admitting: Hematology and Oncology

## 2021-04-18 ENCOUNTER — Other Ambulatory Visit: Payer: Self-pay

## 2021-04-18 ENCOUNTER — Emergency Department
Admission: EM | Admit: 2021-04-18 | Discharge: 2021-04-19 | Disposition: A | Discharge status: Home / Self Care | Payer: 59 | Attending: Emergency Medicine | Admitting: Emergency Medicine

## 2021-04-18 DIAGNOSIS — N939 Abnormal uterine and vaginal bleeding, unspecified: Secondary | ICD-10-CM

## 2021-04-18 DIAGNOSIS — D649 Anemia, unspecified: Secondary | ICD-10-CM

## 2021-04-18 DIAGNOSIS — Z7901 Long term (current) use of anticoagulants: Secondary | ICD-10-CM | POA: Insufficient documentation

## 2021-04-18 DIAGNOSIS — K219 Gastro-esophageal reflux disease without esophagitis: Secondary | ICD-10-CM | POA: Insufficient documentation

## 2021-04-18 DIAGNOSIS — Z86018 Personal history of other benign neoplasm: Secondary | ICD-10-CM

## 2021-04-18 DIAGNOSIS — N920 Excessive and frequent menstruation with regular cycle: Secondary | ICD-10-CM

## 2021-04-18 LAB — BASIC METABOLIC PANEL
Anion gap: 4 — ABNORMAL LOW (ref 5–15)
BUN: 9 mg/dL (ref 6–20)
CO2: 25 mmol/L (ref 22–32)
Calcium: 8.5 mg/dL — ABNORMAL LOW (ref 8.9–10.3)
Chloride: 108 mmol/L (ref 98–111)
Creatinine, Ser: 0.8 mg/dL (ref 0.44–1.00)
GFR, Estimated: >60 mL/min (ref >60)
Glucose, Bld: 103 mg/dL — ABNORMAL HIGH (ref 70–99)
Potassium: 3.8 mmol/L (ref 3.5–5.1)
Sodium: 137 mmol/L (ref 135–145)

## 2021-04-18 LAB — CBC
HCT: 23 % — ABNORMAL LOW (ref 36.0–46.0)
Hemoglobin: 6.9 g/dL — ABNORMAL LOW (ref 12.0–15.0)
MCH: 21.6 pg — ABNORMAL LOW (ref 26.0–34.0)
MCHC: 30 g/dL (ref 30.0–36.0)
MCV: 71.9 fL — ABNORMAL LOW (ref 80.0–100.0)
Platelets: 527 10*3/uL — ABNORMAL HIGH (ref 150–400)
RBC: 3.2 MIL/uL — ABNORMAL LOW (ref 3.87–5.11)
RDW: 26 % — ABNORMAL HIGH (ref 11.5–15.5)
WBC: 4.3 10*3/uL (ref 4.0–10.5)
nRBC: 0 % (ref 0.0–0.2)

## 2021-04-18 LAB — PREPARE RBC (CROSSMATCH)

## 2021-04-18 MED ORDER — SODIUM CHLORIDE 0.9 % IV SOLN
10.0000 mL/h | Freq: Once | INTRAVENOUS | Status: AC
  Administered 2021-04-19: 10 mL/h via INTRAVENOUS

## 2021-04-18 MED ORDER — NORETHINDRONE ACETATE 5 MG PO TABS: 5.0000 mg | ORAL_TABLET | Freq: Four times a day (QID) | ORAL | 0 refills | Status: DC

## 2021-04-18 NOTE — ED Notes (Signed)
Pillow, meal tray and warm blanket provided. Continuing to await antibody clearance for transfusion

## 2021-04-18 NOTE — ED Notes (Signed)
Received call from blood bank, blood ready, will retrieve

## 2021-04-18 NOTE — ED Triage Notes (Signed)
Pt has had heavy vaginal bleeding during her period. Has been following up with PCP/OB and HGB was 8.1 on 8/25 and iron was low. Pt has hx of PE so is on thinner as well-causing worse bleeding. Pt very tired and weak from blood loss. Sent by OB if got worse.

## 2021-04-18 NOTE — ED Notes (Signed)
Additional type and screen walked to blood bank, reports 'still working on antibodies, she has difficult ones,' provider/pt aware

## 2021-04-18 NOTE — ED Notes (Signed)
Called blood bank to check on type/screen, 'working on clearing antibodies'

## 2021-04-18 NOTE — Progress Notes (Signed)
Patient called stating that she has been bleeding heavily for 2 days and that when she stands up she gets a little bit lightheaded.  She was hoping not to go to the hospital.  Of significant note, patient recently had myomectomies and a subsequent PE and is now on anticoagulants.  She is scheduled for Lupron but this has not yet started. Review of the chart shows that her last hemoglobin was approximately 8. I informed her that we could try high-dose progesterone to stop the bleeding as quickly as possible but if she continues to experience lightheadedness she would need to present to the emergency department for hospital admission and subsequent blood transfusion.  She is aware of this.  She has someone with her at home. Aygestin sent to [facility].

## 2021-04-18 NOTE — ED Provider Notes (Signed)
[facility] Emergency Department Provider Note ____________________________________________   Event Date/Time   First MD Initiated Contact with Patient 04/18/21 1944     (approximate)  I have reviewed the triage vital signs and the nursing notes.  HISTORY  Chief Complaint Vaginal Bleeding   HPI Sharon Herring is a 45 y.o. femalewho presents to the ED for evaluation of vaginal bleeding.  [facility] and Dr. Marcelline Mates.  6/30 robot-assisted myomectomy and chromotubation for abnormal uterine bleeding and fibroids.  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  Patient presents to the ED, accompanied by her husband, for evaluation of worsening fatigue, presyncope and concern for low hemoglobin.  She reports starting her typical menstrual period 3 to 4 days ago, and has been having rather heavy flow.  She reports her lower abdominal cramping discomfort with this menstrual period is better than those in the past prior to her recent myomectomy, but the bleeding is still rather severe.  Reports associated passage of clots.  Denies any upper abdominal pain, emesis, stool changes or diarrhea, fevers or syncopal episodes.  Denies chest pain.  Past Medical History:  Diagnosis Date   Acute pulmonary embolism (HCC)    Anemia    Dizziness    Fibroid uterus    GERD (gastroesophageal reflux disease)    History of blood transfusion 02/26/2021   History of thrombocytosis    History of uterine fibroid 03/14/2014   myomectomy   IBS (irritable bowel syndrome)    Iron deficiency anemia 03/14/2014   Migraine    Polyp of stomach 01/06/2016   Stomach polyp, pyloric, consistent with prolapse.     Patient Active Problem List   Diagnosis Date Noted   History of blood transfusion 02/26/2021   Acute pulmonary embolism (HCC)    S/P myomectomy 02/19/2021   Anemia associated with acute blood loss 02/17/2021   Symptomatic anemia 10/26/2020    Lightheadedness 04/15/2020   Liver lesion 01/15/2020   Hiatal hernia 01/15/2020   Lymphadenopathy 01/16/2018   Moderate obstructive sleep apnea 12/19/2017   Swelling of limb 01/27/2017   B12 deficiency 03/28/2016   Vitamin D deficiency 03/28/2016   Hyperglycemia 03/28/2016   Multiple gastric polyps 01/08/2016   Menorrhagia 11/13/2015   Chronic constipation 09/23/2015   Obesity, Class I, BMI 30.0-34.9 (see actual BMI) 06/02/2015   Iron deficiency anemia 03/14/2014   History of uterine fibroid 03/14/2014    Past Surgical History:  Procedure Laterality Date   APPENDECTOMY  2012   CHOLECYSTECTOMY     CHROMOPERTUBATION N/A 02/18/2021   Procedure: CHROMOPERTUBATION;  Surgeon: Rubie Maid, MD;  Location: [facility] ORS;  Service: Gynecology;  Laterality: N/A;   ESOPHAGOGASTRODUODENOSCOPY (EGD) WITH PROPOFOL N/A 01/06/2016   Procedure: ESOPHAGOGASTRODUODENOSCOPY (EGD) WITH PROPOFOL;  Surgeon: Robert Bellow, MD;  Location: [facility] ENDOSCOPY;  Service: Endoscopy;  Laterality: N/A;   HERNIA REPAIR     ROBOT ASSISTED MYOMECTOMY N/A 02/18/2021   Procedure: XI ROBOTIC ASSISTED MYOMECTOMY;  Surgeon: Rubie Maid, MD;  Location: [facility] ORS;  Service: Gynecology;  Laterality: N/A;   ROBOTIC ASSISTED LAPAROSCOPIC CHOLECYSTECTOMY N/A 03/03/2016   Procedure: ROBOTIC ASSISTED LAPAROSCOPIC CHOLECYSTECTOMY ;  Surgeon: Clayburn Pert, MD;  Location: [facility] ORS;  Service: General;  Laterality: N/A;   UTERINE FIBROID SURGERY  2012   XI ROBOTIC ASSISTED VENTRAL HERNIA N/A 07/15/2019   Procedure: XI ROBOTIC ASSISTED VENTRAL HERNIA;  Surgeon: Jules Husbands, MD;  Location: [facility] ORS;  Service: General;  Laterality: N/A;    Prior  to Admission medications   Medication Sig Start Date End Date Taking? Authorizing Provider  albuterol (PROVENTIL) (2.5 MG/3ML) 0.083% nebulizer solution Take 3 mLs (2.5 mg total) by nebulization every 6 (six) hours as needed for wheezing or shortness of breath. 06/20/19   Steele Sizer,  MD  albuterol (VENTOLIN HFA) 108 (90 Base) MCG/ACT inhaler Inhale 2 puffs into the lungs every 6 (six) hours as needed for wheezing or shortness of breath. 09/02/20   Steele Sizer, MD  apixaban (ELIQUIS) 5 MG TABS tablet Take 1 tablet (5 mg total) by mouth 2 (two) times daily. 03/02/21   Steele Sizer, MD  docusate sodium (COLACE) 100 MG capsule Take 1 capsule (100 mg total) by mouth 2 (two) times daily. 02/19/21   Rubie Maid, MD  ferrous sulfate (FERROUSUL) 325 (65 FE) MG tablet Take 1 tablet (325 mg total) by mouth 3 (three) times daily with meals. 10/26/20   Rubie Maid, MD  fluticasone (FLONASE) 50 MCG/ACT nasal spray Place 2 sprays into both nostrils daily. Patient taking differently: Place 2 sprays into both nostrils daily as needed for allergies. 08/21/17   Sharion Balloon, FNP  furosemide (LASIX) 20 MG tablet Take 1 tablet (20 mg total) by mouth daily. Patient taking differently: Take 20 mg by mouth daily as needed for fluid. 07/17/18   Steele Sizer, MD  leuprolide (LUPRON DEPOT, 38-MONTH,) 11.25 MG injection Inject 11.25 mg into the muscle every 3 (three) months. 02/26/21   Rubie Maid, MD  linaclotide [facility]) 290 MCG CAPS capsule Take 1 capsule (290 mcg total) by mouth daily before breakfast. 09/16/19   Steele Sizer, MD  Multiple Vitamins-Minerals (MULTIVITAMIN ADULT) TABS Take 1 tablet by mouth daily. 01/04/17   Steele Sizer, MD  norethindrone (AYGESTIN) 5 MG tablet Take 1 tablet (5 mg total) by mouth in the morning, at noon, in the evening, and at bedtime for 10 days. As directed 04/18/21 04/28/21  Harlin Heys, MD  RIVAROXABAN [employer]) VTE STARTER PACK (15 & 20 MG) Follow package directions: Take one '15mg'$  tablet by mouth twice a day. On day 22, switch to one '20mg'$  tablet once a day. Take with food. 02/22/21 02/22/21  Elodia Florence., MD    Allergies Aspirin, Norethindrone, Oxycodone-acetaminophen, Lexapro [escitalopram], Tape, Belviq [lorcaserin hcl], Dicyclomine, and  Contrave [naltrexone-bupropion hcl er]  Family History  Problem Relation Age of Onset   Hypertension Mother    Hypertension Father    Pancreatic cancer Father 52   Breast cancer Neg Hx    Colon cancer Neg Hx     Social History Social History   Tobacco Use   Smoking status: Never   Smokeless tobacco: Never  Vaping Use   Vaping Use: Never used  Substance Use Topics   Alcohol use: No    Alcohol/week: 0.0 standard drinks   Drug use: No    Review of Systems  Constitutional: No fever/chills.  Positive generalized malaise and presyncopal dizziness Eyes: No visual changes. ENT: No sore throat. Cardiovascular: Denies chest pain. Respiratory: Denies shortness of breath. Gastrointestinal: No abdominal pain.  No nausea, no vomiting.  No diarrhea.  No constipation. Genitourinary: Negative for dysuria. Positive for heavy vaginal bleeding Musculoskeletal: Negative for back pain. Skin: Negative for rash. Neurological: Negative for headaches, focal weakness or numbness.  ____________________________________________   PHYSICAL EXAM:  VITAL SIGNS: Vitals:   04/18/21 2200 04/18/21 2230  BP: 129/85 130/84  Pulse: 77 76  Resp: 10 16  Temp:    SpO2: 100% 100%  Constitutional: Alert and oriented. Well appearing and in no acute distress. Eyes: Conjunctivae are somewhat pale. PERRL. EOMI. Head: Atraumatic. Nose: No congestion/rhinnorhea. Mouth/Throat: Mucous membranes are moist.  Oropharynx non-erythematous. Neck: No stridor. No cervical spine tenderness to palpation. Cardiovascular: Normal rate, regular rhythm. Grossly normal heart sounds.  Good peripheral circulation. Respiratory: Normal respiratory effort.  No retractions. Lungs CTAB. Gastrointestinal: Soft , nondistended,. No CVA tenderness. Mild suprapubic tenderness without peritoneal features. Musculoskeletal: No lower extremity tenderness nor edema.  No joint effusions. No signs of acute trauma. Neurologic:  Normal  speech and language. No gross focal neurologic deficits are appreciated. No gait instability noted. Skin:  Skin is warm, dry and intact. No rash noted. Psychiatric: Mood and affect are normal. Speech and behavior are normal. ____________________________________________   LABS (all labs ordered are listed, but only abnormal results are displayed)  Labs Reviewed  CBC - Abnormal; Notable for the following components:      Result Value   RBC 3.20 (*)    Hemoglobin 6.9 (*)    HCT 23.0 (*)    MCV 71.9 (*)    MCH 21.6 (*)    RDW 26.0 (*)    Platelets 527 (*)    All other components within normal limits  BASIC METABOLIC PANEL - Abnormal; Notable for the following components:   Glucose, Bld 103 (*)    Calcium 8.5 (*)    Anion gap 4 (*)    All other components within normal limits  TYPE AND SCREEN  PREPARE RBC (CROSSMATCH)   ____________________________________________  12 Lead EKG   ____________________________________________  RADIOLOGY  ED MD interpretation:    Official radiology report(s): No results found.  ____________________________________________   PROCEDURES and INTERVENTIONS  Procedure(s) performed (including Critical Care):  .1-3 Lead EKG Interpretation  Date/Time: 04/18/2021 8:52 PM Performed by: Vladimir Crofts, MD Authorized by: Vladimir Crofts, MD     Interpretation: normal     ECG rate:  76   ECG rate assessment: normal     Rhythm: sinus rhythm     Ectopy: none     Conduction: normal   .Critical Care  Date/Time: 04/18/2021 8:52 PM Performed by: Vladimir Crofts, MD Authorized by: Vladimir Crofts, MD   Critical care provider statement:    Critical care time (minutes):  45   Critical care was necessary to treat or prevent imminent or life-threatening deterioration of the following conditions:  Circulatory failure   Critical care was time spent personally by me on the following activities:  Discussions with consultants, evaluation of patient's response to  treatment, examination of patient, ordering and performing treatments and interventions, ordering and review of laboratory studies, ordering and review of radiographic studies, pulse oximetry, re-evaluation of patient's condition, obtaining history from patient or surrogate and review of old charts  Medications  0.9 %  sodium chloride infusion (has no administration in time range)    ____________________________________________   MDM / ED COURSE   Pleasant 45 year old woman presents to the ED with symptomatic anemia in the setting of abnormal uterine bleeding and menorrhagia while being anticoagulated, requiring transfusion of PRBCs, and likely amenable to outpatient management with OB/GYN follow-up after this.  She presents stable without tachycardia.  She is ambulatory with a normal gait.  Blood work shows microcytic anemia with hemoglobin of 6.9, and is otherwise unremarkable.  We will transfuse 1 unit and as long as there are no complications, anticipate outpatient management with OB/GYN follow-up.  Patient signed out to oncoming provider to follow-up on response  to transfusion.  Clinical Course as of 04/18/21 2321  Sharon Herring Apr 18, 2021  2043 Discussed symptomatic anemia with patient and her husband, who is at the bedside.  We discussed transfusion of packed red cells.  We discussed risk and benefits.  All are in agreement with transfusion and likely outpatient management thereafter with OB/GYN follow-up, as nausea no complications. [DS]    Clinical Course User Index [DS] Vladimir Crofts, MD    ____________________________________________   FINAL CLINICAL IMPRESSION(S) / ED DIAGNOSES  Final diagnoses:  Abnormal uterine bleeding (AUB)  Symptomatic anemia     ED Discharge Orders     None        Wadie Mattie   Note:  This document was prepared using Dragon voice recognition software and may include unintentional dictation errors.    Vladimir Crofts, MD 04/18/21 2322

## 2021-04-19 ENCOUNTER — Ambulatory Visit (INDEPENDENT_AMBULATORY_CARE_PROVIDER_SITE_OTHER): Payer: Self-pay

## 2021-04-19 ENCOUNTER — Encounter: Payer: Self-pay | Admitting: Hematology and Oncology

## 2021-04-19 DIAGNOSIS — N920 Excessive and frequent menstruation with regular cycle: Secondary | ICD-10-CM

## 2021-04-19 DIAGNOSIS — Z86018 Personal history of other benign neoplasm: Secondary | ICD-10-CM

## 2021-04-19 MED ORDER — LEUPROLIDE ACETATE (3 MONTH) 11.25 MG IM KIT
11.2500 mg | PACK | Freq: Once | INTRAMUSCULAR | Status: AC
  Administered 2021-04-19: 11.25 mg via INTRAMUSCULAR

## 2021-04-19 NOTE — ED Notes (Signed)
Spoke with blood bank regarding future transfusions for pt, reported pt will experience this issue forever due to preexisting antibodies and cross matching process prior to transfusion, relayed to pt

## 2021-04-19 NOTE — Discharge Instructions (Addendum)
Return to the ER for worsening symptoms, persistent vomiting, difficulty breathing, fainting or other concerns.

## 2021-04-19 NOTE — ED Notes (Signed)
Pt reports that toilet was filthy, housekeeping notified

## 2021-04-19 NOTE — Progress Notes (Signed)
Pt present for first lupron injection. Pt stated that she was doing well no problems.

## 2021-04-19 NOTE — ED Notes (Signed)
Pt reports feels better and is ready to go

## 2021-04-20 ENCOUNTER — Other Ambulatory Visit: Payer: Self-pay | Admitting: Family Medicine

## 2021-04-20 DIAGNOSIS — D500 Iron deficiency anemia secondary to blood loss (chronic): Secondary | ICD-10-CM

## 2021-04-20 LAB — TYPE AND SCREEN
ABO/RH(D): A POS
Antibody Screen: POSITIVE
Donor AG Type: NEGATIVE
Unit division: 0
Unit division: 0
Unit division: 0

## 2021-04-20 LAB — BPAM RBC
Blood Product Expiration Date: 202209272359
Blood Product Expiration Date: 202209272359
Blood Product Expiration Date: 202209272359
ISSUE DATE / TIME: 202208290014
Unit Type and Rh: 6200
Unit Type and Rh: 6200
Unit Type and Rh: 6200

## 2021-05-04 ENCOUNTER — Encounter: Payer: Self-pay | Admitting: Hematology and Oncology

## 2021-05-07 ENCOUNTER — Inpatient Hospital Stay: Payer: Self-pay

## 2021-05-07 ENCOUNTER — Inpatient Hospital Stay: Payer: Self-pay | Admitting: Oncology

## 2021-06-03 ENCOUNTER — Telehealth: Payer: Self-pay

## 2021-06-03 NOTE — Telephone Encounter (Signed)
Pt states she is still bleeding.   Lupron was suppose to stop cycle. She is still having a cycle. She does not think she wants the second injection. She is having hot flashes and moodiness.   Pls advise. Pt aware ASC will get this message in the a.m.

## 2021-06-04 NOTE — Telephone Encounter (Signed)
Unfortunately it can take up to a second injection to stop the bleeding.  Yes some side effects do involve hot flushes or moodiness. Usually we would treat this with add back therapy with a small amount of estrogen, however due to her medical history, she cannot take.  There's potentially 1 other option, and that medication comes in pill form.  It is not a birth control.

## 2021-06-04 NOTE — Telephone Encounter (Signed)
Please advise. Thanks Shiraz Bastyr 

## 2021-06-07 NOTE — Telephone Encounter (Signed)
Please see mychart messsages

## 2021-07-23 ENCOUNTER — Ambulatory Visit (INDEPENDENT_AMBULATORY_CARE_PROVIDER_SITE_OTHER): Payer: 59 | Admitting: Obstetrics and Gynecology

## 2021-07-23 ENCOUNTER — Encounter: Payer: Self-pay | Admitting: Obstetrics and Gynecology

## 2021-07-23 ENCOUNTER — Encounter: Payer: Self-pay | Admitting: Hematology and Oncology

## 2021-07-23 ENCOUNTER — Other Ambulatory Visit: Payer: Self-pay

## 2021-07-23 VITALS — BP 138/90 | HR 82 | Ht 65.0 in | Wt 197.7 lb

## 2021-07-23 DIAGNOSIS — Z86711 Personal history of pulmonary embolism: Secondary | ICD-10-CM | POA: Diagnosis not present

## 2021-07-23 DIAGNOSIS — N920 Excessive and frequent menstruation with regular cycle: Secondary | ICD-10-CM | POA: Diagnosis not present

## 2021-07-23 DIAGNOSIS — Z86018 Personal history of other benign neoplasm: Secondary | ICD-10-CM | POA: Diagnosis not present

## 2021-07-23 DIAGNOSIS — D500 Iron deficiency anemia secondary to blood loss (chronic): Secondary | ICD-10-CM | POA: Diagnosis not present

## 2021-07-23 NOTE — Progress Notes (Signed)
GYNECOLOGY PROGRESS NOTE  Subjective:    Patient ID: Sharon Herring, female    DOB: [birth year], 45 y.o.   MRN: [id number]  HPI  Patient is a 45 y.o. [id number] female who presents for medication management. Received 1 dose of the Depot Lupron (11.25 mg) for management of her menorrhagia, fibroid uterus, and fibroid uterus.  Also has has h/o PE 6 months ago after surgery (myomectomy), currently on anticoagulant therapy.  Notes that she had moderate side effects and still had bleeding up until the last month where she did not have a cycle. Most recent cycle started today. Desires to discuss other options. Taking a different iron therapy, has been on this regimen for ~ 1 month.   The following portions of the patient's history were reviewed and updated as appropriate: allergies, current medications, past family history, past medical history, past social history, past surgical history, and problem list.  Current Outpatient Medications on File Prior to Visit  Medication Sig Dispense Refill   albuterol (PROVENTIL) (2.5 MG/3ML) 0.083% nebulizer solution Take 3 mLs (2.5 mg total) by nebulization every 6 (six) hours as needed for wheezing or shortness of breath. 150 mL 1   albuterol (VENTOLIN HFA) 108 (90 Base) MCG/ACT inhaler Inhale 2 puffs into the lungs every 6 (six) hours as needed for wheezing or shortness of breath. 18 g 0   apixaban (ELIQUIS) 5 MG TABS tablet Take 1 tablet (5 mg total) by mouth 2 (two) times daily. 60 tablet 4   docusate sodium (COLACE) 100 MG capsule Take 1 capsule (100 mg total) by mouth 2 (two) times daily. 10 capsule 0   ferrous sulfate (FERROUSUL) 325 (65 FE) MG tablet Take 1 tablet (325 mg total) by mouth 3 (three) times daily with meals. 90 tablet 1   fluticasone (FLONASE) 50 MCG/ACT nasal spray Place 2 sprays into both nostrils daily. (Patient taking differently: Place 2 sprays into both nostrils daily as needed for allergies.) 16 g 6   furosemide (LASIX) 20 MG tablet  Take 1 tablet (20 mg total) by mouth daily. (Patient taking differently: Take 20 mg by mouth daily as needed for fluid.) 30 tablet 1   linaclotide (LINZESS) 290 MCG CAPS capsule Take 1 capsule (290 mcg total) by mouth daily before breakfast. 30 capsule 2   Multiple Vitamins-Minerals (MULTIVITAMIN ADULT) TABS Take 1 tablet by mouth daily. 30 tablet 0   leuprolide (LUPRON DEPOT, 25-MONTH,) 11.25 MG injection Inject 11.25 mg into the muscle every 3 (three) months. (Patient not taking: Reported on 07/23/2021) 1 each 1   norethindrone (AYGESTIN) 5 MG tablet Take 1 tablet (5 mg total) by mouth in the morning, at noon, in the evening, and at bedtime for 10 days. As directed 40 tablet 0   [DISCONTINUED] RIVAROXABAN (XARELTO) VTE STARTER PACK (15 & 20 MG) Follow package directions: Take one 15mg  tablet by mouth twice a day. On day 22, switch to one 20mg  tablet once a day. Take with food. 51 each 0   Current Facility-Administered Medications on File Prior to Visit  Medication Dose Route Frequency Provider Last Rate Last Admin   cyanocobalamin ((VITAMIN B-12)) injection 1,000 mcg  1,000 mcg Intramuscular Once Steele Sizer, MD         Review of Systems Pertinent items noted in HPI and remainder of comprehensive ROS otherwise negative.   Objective:   Blood pressure 138/90, pulse 82, height 5\' 5"  (1.651 m), weight 197 lb 11.2 oz (89.7 kg), last menstrual period 07/22/2021.  Body mass index is 32.9 kg/m. General appearance: alert and no distress Remainder of exam deferred.    Assessment:   1. Menorrhagia with regular cycle   2. History of uterine fibroid   3. History of pulmonary embolism   4. Iron deficiency anemia due to chronic blood loss    Plan:    1. Menorrhagia with regular cycle - Discussed other options. Has tried Myfembree, progesterone-only OCPs, Aygestin, and now Depot Lupron. Will try [place]. Declines use of lower dose of Depot Lupron, or trial of Depo Provera.   2. History of  uterine fibroid - S/p myomectomy. Is recommend to not attempt conception until 6-12 months s/p surgery.   3. History of pulmonary embolism - Currently on Xarelto, has appt with PCP within the next month for next CT scan to assess for resolution of clot.   4. Iron deficiency anemia due to chronic blood loss On iron therapy, new regimen started last month.  Will have levels checked at next visit with PCP.    To follow up once CT scan performed to discuss next steps in therapy (whether to continue management of bleeding and fibroids, or discuss conception plans if clot has resolved.     A total of 20 minutes were spent face-to-face with the patient during this encounter and over half of that time involved counseling and coordination of care.  Rubie Maid, MD [facility]

## 2021-09-03 ENCOUNTER — Encounter: Payer: Self-pay | Admitting: Hematology and Oncology

## 2021-09-03 NOTE — Telephone Encounter (Signed)
error 

## 2021-11-02 ENCOUNTER — Emergency Department: Payer: Self-pay

## 2021-11-02 ENCOUNTER — Emergency Department: Payer: Self-pay | Admitting: Radiology

## 2021-11-02 ENCOUNTER — Inpatient Hospital Stay
Admission: EM | Admit: 2021-11-02 | Discharge: 2021-11-08 | DRG: 872 | Disposition: A | Discharge status: Home Health | Payer: PRIVATE HEALTH INSURANCE | Attending: Internal Medicine | Admitting: Internal Medicine

## 2021-11-02 ENCOUNTER — Other Ambulatory Visit: Payer: Self-pay

## 2021-11-02 DIAGNOSIS — R509 Fever, unspecified: Secondary | ICD-10-CM

## 2021-11-02 DIAGNOSIS — Z888 Allergy status to other drugs, medicaments and biological substances status: Secondary | ICD-10-CM

## 2021-11-02 DIAGNOSIS — Z886 Allergy status to analgesic agent status: Secondary | ICD-10-CM

## 2021-11-02 DIAGNOSIS — M75111 Incomplete rotator cuff tear or rupture of right shoulder, not specified as traumatic: Secondary | ICD-10-CM | POA: Diagnosis present

## 2021-11-02 DIAGNOSIS — D649 Anemia, unspecified: Secondary | ICD-10-CM

## 2021-11-02 DIAGNOSIS — M25572 Pain in left ankle and joints of left foot: Secondary | ICD-10-CM | POA: Diagnosis present

## 2021-11-02 DIAGNOSIS — R6510 Systemic inflammatory response syndrome (SIRS) of non-infectious origin without acute organ dysfunction: Secondary | ICD-10-CM | POA: Diagnosis present

## 2021-11-02 DIAGNOSIS — M009 Pyogenic arthritis, unspecified: Secondary | ICD-10-CM | POA: Diagnosis present

## 2021-11-02 DIAGNOSIS — D509 Iron deficiency anemia, unspecified: Secondary | ICD-10-CM | POA: Diagnosis present

## 2021-11-02 DIAGNOSIS — A5442 Gonococcal arthritis: Secondary | ICD-10-CM | POA: Diagnosis present

## 2021-11-02 DIAGNOSIS — Z885 Allergy status to narcotic agent status: Secondary | ICD-10-CM

## 2021-11-02 DIAGNOSIS — Z6832 Body mass index (BMI) 32.0-32.9, adult: Secondary | ICD-10-CM

## 2021-11-02 DIAGNOSIS — M659 Synovitis and tenosynovitis, unspecified: Secondary | ICD-10-CM | POA: Diagnosis present

## 2021-11-02 DIAGNOSIS — M25511 Pain in right shoulder: Secondary | ICD-10-CM | POA: Diagnosis not present

## 2021-11-02 DIAGNOSIS — Z9049 Acquired absence of other specified parts of digestive tract: Secondary | ICD-10-CM

## 2021-11-02 DIAGNOSIS — E669 Obesity, unspecified: Secondary | ICD-10-CM | POA: Diagnosis present

## 2021-11-02 DIAGNOSIS — Z86711 Personal history of pulmonary embolism: Secondary | ICD-10-CM | POA: Diagnosis present

## 2021-11-02 DIAGNOSIS — A419 Sepsis, unspecified organism: Secondary | ICD-10-CM | POA: Diagnosis present

## 2021-11-02 DIAGNOSIS — Z8249 Family history of ischemic heart disease and other diseases of the circulatory system: Secondary | ICD-10-CM

## 2021-11-02 DIAGNOSIS — K219 Gastro-esophageal reflux disease without esophagitis: Secondary | ICD-10-CM | POA: Diagnosis present

## 2021-11-02 DIAGNOSIS — Z79899 Other long term (current) drug therapy: Secondary | ICD-10-CM

## 2021-11-02 DIAGNOSIS — G43909 Migraine, unspecified, not intractable, without status migrainosus: Secondary | ICD-10-CM | POA: Diagnosis present

## 2021-11-02 DIAGNOSIS — Z8719 Personal history of other diseases of the digestive system: Secondary | ICD-10-CM

## 2021-11-02 DIAGNOSIS — Z91048 Other nonmedicinal substance allergy status: Secondary | ICD-10-CM

## 2021-11-02 DIAGNOSIS — K589 Irritable bowel syndrome without diarrhea: Secondary | ICD-10-CM | POA: Diagnosis present

## 2021-11-02 DIAGNOSIS — A5486 Gonococcal sepsis: Principal | ICD-10-CM

## 2021-11-02 DIAGNOSIS — E538 Deficiency of other specified B group vitamins: Secondary | ICD-10-CM

## 2021-11-02 DIAGNOSIS — D75839 Thrombocytosis, unspecified: Secondary | ICD-10-CM | POA: Diagnosis present

## 2021-11-02 DIAGNOSIS — M25519 Pain in unspecified shoulder: Secondary | ICD-10-CM

## 2021-11-02 DIAGNOSIS — Z20822 Contact with and (suspected) exposure to covid-19: Secondary | ICD-10-CM | POA: Diagnosis present

## 2021-11-02 DIAGNOSIS — Z7901 Long term (current) use of anticoagulants: Secondary | ICD-10-CM

## 2021-11-02 HISTORY — PX: IR FLUORO GUIDED NEEDLE PLC ASPIRATION/INJECTION LOC: IMG2395

## 2021-11-02 LAB — SYNOVIAL CELL COUNT + DIFF, W/ CRYSTALS
Crystals, Fluid: NONE SEEN
Eosinophils-Synovial: 0 %
Lymphocytes-Synovial Fld: 5 %
Monocyte-Macrophage-Synovial Fluid: 12 %
Neutrophil, Synovial: 83 %
WBC, Synovial: 823 /mm3 — ABNORMAL HIGH (ref 0–200)

## 2021-11-02 LAB — COMPREHENSIVE METABOLIC PANEL
ALT: 24 U/L (ref 0–44)
AST: 23 U/L (ref 15–41)
Albumin: 3.4 g/dL — ABNORMAL LOW (ref 3.5–5.0)
Alkaline Phosphatase: 70 U/L (ref 38–126)
Anion gap: 7 (ref 5–15)
BUN: 11 mg/dL (ref 6–20)
CO2: 27 mmol/L (ref 22–32)
Calcium: 9.1 mg/dL (ref 8.9–10.3)
Chloride: 101 mmol/L (ref 98–111)
Creatinine, Ser: 0.86 mg/dL (ref 0.44–1.00)
GFR, Estimated: >60 mL/min (ref >60)
Glucose, Bld: 120 mg/dL — ABNORMAL HIGH (ref 70–99)
Potassium: 4 mmol/L (ref 3.5–5.1)
Sodium: 135 mmol/L (ref 135–145)
Total Bilirubin: 0.5 mg/dL (ref 0.3–1.2)
Total Protein: 8.1 g/dL (ref 6.5–8.1)

## 2021-11-02 LAB — LACTATE DEHYDROGENASE: LDH: 138 U/L (ref 98–192)

## 2021-11-02 LAB — CBC WITH DIFFERENTIAL/PLATELET
Abs Immature Granulocytes: 0.08 10*3/uL — ABNORMAL HIGH (ref 0.00–0.07)
Basophils Absolute: 0 10*3/uL (ref 0.0–0.1)
Basophils Relative: 0 %
Eosinophils Absolute: 0 10*3/uL (ref 0.0–0.5)
Eosinophils Relative: 0 %
HCT: 25.1 % — ABNORMAL LOW (ref 36.0–46.0)
Hemoglobin: 7.1 g/dL — ABNORMAL LOW (ref 12.0–15.0)
Immature Granulocytes: 1 %
Lymphocytes Relative: 11 %
Lymphs Abs: 1.5 10*3/uL (ref 0.7–4.0)
MCH: 17.5 pg — ABNORMAL LOW (ref 26.0–34.0)
MCHC: 28.3 g/dL — ABNORMAL LOW (ref 30.0–36.0)
MCV: 61.8 fL — ABNORMAL LOW (ref 80.0–100.0)
Monocytes Absolute: 1.5 10*3/uL — ABNORMAL HIGH (ref 0.1–1.0)
Monocytes Relative: 11 %
Neutro Abs: 10.9 10*3/uL — ABNORMAL HIGH (ref 1.7–7.7)
Neutrophils Relative %: 77 %
Platelets: 557 10*3/uL — ABNORMAL HIGH (ref 150–400)
RBC: 4.06 MIL/uL (ref 3.87–5.11)
RDW: 20.5 % — ABNORMAL HIGH (ref 11.5–15.5)
Smear Review: NORMAL
WBC: 14.1 10*3/uL — ABNORMAL HIGH (ref 4.0–10.5)
nRBC: 0 % (ref 0.0–0.2)

## 2021-11-02 LAB — URINALYSIS, ROUTINE W REFLEX MICROSCOPIC
Bilirubin Urine: NEGATIVE
Glucose, UA: NEGATIVE mg/dL
Hgb urine dipstick: NEGATIVE
Ketones, ur: 20 mg/dL — AB
Nitrite: NEGATIVE
Protein, ur: 30 mg/dL — AB
Specific Gravity, Urine: 1.027 (ref 1.005–1.030)
pH: 5 (ref 5.0–8.0)

## 2021-11-02 LAB — RETICULOCYTES
Immature Retic Fract: 23.3 % — ABNORMAL HIGH (ref 2.3–15.9)
RBC.: 4.15 MIL/uL (ref 3.87–5.11)
Retic Count, Absolute: 34.4 10*3/uL (ref 19.0–186.0)
Retic Ct Pct: 0.8 % (ref 0.4–3.1)

## 2021-11-02 LAB — IRON AND TIBC
Iron: 14 ug/dL — ABNORMAL LOW (ref 28–170)
Saturation Ratios: 3 % — ABNORMAL LOW (ref 10.4–31.8)
TIBC: 414 ug/dL (ref 250–450)
UIBC: 400 ug/dL

## 2021-11-02 LAB — CHLAMYDIA/NGC RT PCR (ARMC ONLY)
Chlamydia Tr: NOT DETECTED
N gonorrhoeae: DETECTED — AB

## 2021-11-02 LAB — RESP PANEL BY RT-PCR (FLU A&B, COVID) ARPGX2
Influenza A by PCR: NEGATIVE
Influenza B by PCR: NEGATIVE
SARS Coronavirus 2 by RT PCR: NEGATIVE

## 2021-11-02 LAB — FOLATE: Folate: 18.3 ng/mL (ref >5.9)

## 2021-11-02 LAB — SEDIMENTATION RATE: Sed Rate: 86 mm/hr — ABNORMAL HIGH (ref 0–20)

## 2021-11-02 LAB — C-REACTIVE PROTEIN: CRP: 19.6 mg/dL — ABNORMAL HIGH (ref <1.0)

## 2021-11-02 LAB — LACTIC ACID, PLASMA: Lactic Acid, Venous: 1 mmol/L (ref 0.5–1.9)

## 2021-11-02 LAB — HIV ANTIBODY (ROUTINE TESTING W REFLEX): HIV Screen 4th Generation wRfx: NONREACTIVE

## 2021-11-02 LAB — PREGNANCY, URINE: Preg Test, Ur: NEGATIVE

## 2021-11-02 LAB — PATHOLOGIST SMEAR REVIEW

## 2021-11-02 LAB — FERRITIN: Ferritin: 22 ng/mL (ref 11–307)

## 2021-11-02 LAB — VITAMIN B12: Vitamin B-12: 231 pg/mL (ref 180–914)

## 2021-11-02 LAB — PROCALCITONIN: Procalcitonin: <0.1 ng/mL

## 2021-11-02 LAB — URIC ACID: Uric Acid, Serum: 2.8 mg/dL (ref 2.5–7.1)

## 2021-11-02 MED ORDER — FENTANYL CITRATE PF 50 MCG/ML IJ SOSY: 25.0000 ug | PREFILLED_SYRINGE | INTRAMUSCULAR | Status: DC | PRN

## 2021-11-02 MED ORDER — CYANOCOBALAMIN 1000 MCG/ML IJ SOLN: 1000.0000 ug | Freq: Once | INTRAMUSCULAR | Status: DC

## 2021-11-02 MED ORDER — VANCOMYCIN HCL 2000 MG/400ML IV SOLN
2000.0000 mg | Freq: Once | INTRAVENOUS | Status: AC
  Administered 2021-11-02: 2000 mg via INTRAVENOUS
  Filled 2021-11-02: qty 400

## 2021-11-02 MED ORDER — ACETAMINOPHEN 325 MG PO TABS
650.0000 mg | ORAL_TABLET | Freq: Four times a day (QID) | ORAL | Status: DC | PRN
  Administered 2021-11-02 – 2021-11-08 (×11): 650 mg via ORAL
  Filled 2021-11-02 (×11): qty 2

## 2021-11-02 MED ORDER — ADULT MULTIVITAMIN W/MINERALS CH
1.0000 | ORAL_TABLET | Freq: Every day | ORAL | Status: DC
  Administered 2021-11-02 – 2021-11-08 (×7): 1 via ORAL
  Filled 2021-11-02 (×7): qty 1

## 2021-11-02 MED ORDER — SODIUM CHLORIDE 0.9 % IV SOLN
2.0000 g | Freq: Three times a day (TID) | INTRAVENOUS | Status: DC
  Administered 2021-11-02 – 2021-11-03 (×2): 2 g via INTRAVENOUS
  Filled 2021-11-02 (×5): qty 2

## 2021-11-02 MED ORDER — FENTANYL CITRATE PF 50 MCG/ML IJ SOSY
50.0000 ug | PREFILLED_SYRINGE | Freq: Once | INTRAMUSCULAR | Status: AC
  Administered 2021-11-02: 50 ug via INTRAVENOUS
  Filled 2021-11-02: qty 1

## 2021-11-02 MED ORDER — APIXABAN 5 MG PO TABS
5.0000 mg | ORAL_TABLET | Freq: Two times a day (BID) | ORAL | Status: DC
  Administered 2021-11-02 – 2021-11-03 (×2): 5 mg via ORAL
  Filled 2021-11-02 (×2): qty 1

## 2021-11-02 MED ORDER — LIDOCAINE HCL 1 % IJ SOLN
INTRAMUSCULAR | Status: AC
  Administered 2021-11-02: 5 mL
  Filled 2021-11-02: qty 20

## 2021-11-02 MED ORDER — IOHEXOL 350 MG/ML SOLN
2.0000 mL | Freq: Once | INTRAVENOUS | Status: AC | PRN
  Administered 2021-11-02: 2 mL

## 2021-11-02 MED ORDER — VANCOMYCIN HCL 750 MG/150ML IV SOLN
750.0000 mg | Freq: Two times a day (BID) | INTRAVENOUS | Status: DC
  Administered 2021-11-03: 750 mg via INTRAVENOUS
  Filled 2021-11-02: qty 150

## 2021-11-02 MED ORDER — VANCOMYCIN HCL IN DEXTROSE 1-5 GM/200ML-% IV SOLN
1000.0000 mg | Freq: Once | INTRAVENOUS | Status: DC
  Filled 2021-11-02: qty 200

## 2021-11-02 MED ORDER — HYDROMORPHONE HCL 2 MG PO TABS
2.0000 mg | ORAL_TABLET | ORAL | Status: DC | PRN
  Administered 2021-11-02 – 2021-11-06 (×18): 2 mg via ORAL
  Filled 2021-11-02 (×21): qty 1

## 2021-11-02 MED ORDER — SODIUM CHLORIDE 0.9 % IV BOLUS
1000.0000 mL | Freq: Once | INTRAVENOUS | Status: AC
  Administered 2021-11-02: 1000 mL via INTRAVENOUS

## 2021-11-02 MED ORDER — SODIUM CHLORIDE 0.9 % IV SOLN
510.0000 mg | Freq: Once | INTRAVENOUS | Status: AC
  Administered 2021-11-02: 510 mg via INTRAVENOUS
  Filled 2021-11-02 (×2): qty 17

## 2021-11-02 MED ORDER — CEFEPIME HCL 2 G IJ SOLR
2.0000 g | Freq: Once | INTRAMUSCULAR | Status: AC
  Administered 2021-11-02: 2 g via INTRAVENOUS
  Filled 2021-11-02: qty 2

## 2021-11-02 MED ORDER — ALBUTEROL SULFATE (2.5 MG/3ML) 0.083% IN NEBU
3.0000 mL | INHALATION_SOLUTION | RESPIRATORY_TRACT | Status: DC | PRN
Start: 2021-11-02 — End: 2021-11-08

## 2021-11-02 MED ORDER — ACETAMINOPHEN 500 MG PO TABS
1000.0000 mg | ORAL_TABLET | Freq: Once | ORAL | Status: AC
  Administered 2021-11-02: 1000 mg via ORAL
  Filled 2021-11-02: qty 2

## 2021-11-02 MED ORDER — SODIUM CHLORIDE 0.9 % IV BOLUS
1000.0000 mL | Freq: Once | INTRAVENOUS | Status: AC
Start: 2021-11-02 — End: 2021-11-02
  Administered 2021-11-02: 1000 mL via INTRAVENOUS

## 2021-11-02 MED ORDER — FERROUS SULFATE 325 (65 FE) MG PO TABS
325.0000 mg | ORAL_TABLET | Freq: Three times a day (TID) | ORAL | Status: DC
Start: 2021-11-03 — End: 2021-11-08
  Administered 2021-11-03 – 2021-11-08 (×17): 325 mg via ORAL
  Filled 2021-11-02 (×17): qty 1

## 2021-11-02 NOTE — Consult Note (Signed)
Pharmacy Antibiotic Note ? ?Sharon Herring is a 46 y.o. 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  Pharmacy has been consulted for vancomycin and cefepime dosing. ? ?Plan: ?Vancomycin 2gm IV x1 followed by Vancomycin '750mg'$  IV every 12 hours ?Cefepime 2g IV every 8 hours ?Goal AUC 400-550  ?Est AUC: 501.1 ?Est Cmax: 30.3 ?Est Cmin: 14.6 ?Calculated with SCr 0.86 ? ?Weight: 89.7 kg (197 lb 12 oz) ? ?Temp (24hrs), Avg:99.6 ?F (37.6 ?C), Min:98.9 ?F (37.2 ?C), Max:100.3 ?F (37.9 ?C) ? ?Recent Labs  ?Lab 11/02/21 ?0815  ?WBC 14.1*  ?CREATININE 0.86  ?LATICACIDVEN 1.0  ?  ?Estimated Creatinine Clearance: 92.4 mL/min (by C-G formula based on SCr of 0.86 mg/dL).   ? ?Allergies  ?Allergen Reactions  ? Aspirin Nausea And Vomiting  ? Norethindrone Shortness Of Breath and Rash  ? Oxycodone-Acetaminophen Rash and Shortness Of Breath  ? Lexapro [Escitalopram] Palpitations  ? Tape Other (See Comments)  ?  Burning feeling. Has left mark on skin.  ? Belviq [Lorcaserin Hcl] Other (See Comments)  ?  headache  ? Dicyclomine Nausea And Vomiting  ? Contrave [Naltrexone-Bupropion Hcl Er] Anxiety  ?  Altered mental state  ? ? ?Antimicrobials this admission: ?3/14 Vancomycin >>  ?3/14 Cefepime >>  ? ?Dose adjustments this admission: ? ? ?Microbiology results: ?3/14 BCx:  ? ?Thank you for allowing pharmacy to be a part of this patient?s care. ? ?Darrick Penna, PharmD, MS PGPM ?Clinical Pharmacist ?11/02/2021 ?5:23 PM ? ? ?

## 2021-11-02 NOTE — ED Provider Notes (Signed)
? ?[facility] ?Provider Note ? ? ? Event Date/Time  ? First MD Initiated Contact with Patient 11/02/21 0818   ?  (approximate) ? ? ?History  ? ?Left ankle pain. ? ? ?HPI ? ?Sharon Herring is a 46 y.o. female  who, per ob/gyn note dated 07/23/2022 has history of menorrhagia, fibroids who presents to the emergency department today because of concern for left ankle pain. The patient says that it started 4 days ago. Started suddenly. Denies any injury or trauma to her ankle. She has noticed pain with movement and swelling. Additionally the patient started developing some right shoulder pain. Denies any trauma to her right shoulder. The patient also had fevers. Denies any nausea. Says she has history of uterine bleeding but since having a fibroid removed a number of months ago feels her bleeding has been normal menstrual bleeding.  ? ? ?Physical Exam  ? ?Triage Vital Signs: ?ED Triage Vitals  ?Enc Vitals Group  ?   BP 142/89  ?   Pulse 102  ?   Resp 18  ?   Temp 100.3  ?   Temp src   ?   SpO2 100  ?   Weight   ?   Height   ?   Head Circumference   ?   Peak Flow   ?   Pain Score   ?   Pain Loc   ?   Pain Edu?   ?   Excl. in [place]?   ? ? ?Most recent vital signs: ?Vitals:  ? 11/02/21 0830 11/02/21 0831  ?BP: 125/72 125/72  ?Pulse: 91 91  ?Resp:  18  ?Temp:    ?SpO2: 99% 99%  ? ? ?General: Awake, no distress.  ?CV:  Good peripheral perfusion.  ?Resp:  Normal effort. Lungs clear ?Abd:  No distention.  ?MSK:  Swelling, warmth to left ankle, tender to manipulation. Some tenderness with manipulation of the right shoulder.  ? ? ? ?ED Results / Procedures / Treatments  ? ?Labs ?(all labs ordered are listed, but only abnormal results are displayed) ?Labs Reviewed  ?COMPREHENSIVE METABOLIC PANEL - Abnormal; Notable for the following components:  ?    Result Value  ? Glucose, Bld 120 (*)   ? Albumin 3.4 (*)   ? All other components within normal limits  ?CBC WITH DIFFERENTIAL/PLATELET - Abnormal; Notable for  the following components:  ? WBC 14.1 (*)   ? Hemoglobin 7.1 (*)   ? HCT 25.1 (*)   ? MCV 61.8 (*)   ? MCH 17.5 (*)   ? MCHC 28.3 (*)   ? RDW 20.5 (*)   ? Platelets 557 (*)   ? Neutro Abs 10.9 (*)   ? Monocytes Absolute 1.5 (*)   ? Abs Immature Granulocytes 0.08 (*)   ? All other components within normal limits  ?URINALYSIS, ROUTINE W REFLEX MICROSCOPIC - Abnormal; Notable for the following components:  ? Color, Urine YELLOW (*)   ? APPearance CLOUDY (*)   ? Ketones, ur 20 (*)   ? Protein, ur 30 (*)   ? Leukocytes,Ua TRACE (*)   ? Bacteria, UA RARE (*)   ? All other components within normal limits  ?SYNOVIAL CELL COUNT + DIFF, W/ CRYSTALS - Abnormal; Notable for the following components:  ? Appearance-Synovial CLOUDY (*)   ? WBC, Synovial 823 (*)   ? All other components within normal limits  ?RESP PANEL BY RT-PCR (FLU A&B, COVID) ARPGX2  ?BODY FLUID  CULTURE W GRAM STAIN  ?CULTURE, BLOOD (ROUTINE X 2)  ?CULTURE, BLOOD (ROUTINE X 2)  ?LACTIC ACID, PLASMA  ?LACTATE DEHYDROGENASE  ?PATHOLOGIST SMEAR REVIEW  ?PREGNANCY, URINE  ?FERRITIN  ?LD, BODY FLUID (OTHER)  ? ? ? ?EKG ? ?None ? ? ?RADIOLOGY ?I independently interpreted and visualized the CXR. My interpretation: No pneumonia. No pneumothorax. ?Radiology interpretation: IMPRESSION:  ?No evidence of acute cardiopulmonary disease.  ? ?I independently interpreted and visualized the left ankle xray. My interpretation: No dislocation/fracture ?Radiology interpretation:  ?IMPRESSION:  ?No recent fracture or dislocation is seen. There are no focal lytic  ?lesions. 7 mm smooth marginated calcification adjacent to the tip of  ?medial malleolus may be residual from previous injury.  ? ? ? ? ? ?PROCEDURES: ? ?Critical Care performed: No ? ?Procedures ? ? ?MEDICATIONS ORDERED IN ED: ?Medications - No data to display ? ? ?IMPRESSION / MDM / ASSESSMENT AND PLAN / ED COURSE  ?I reviewed the triage vital signs and the nursing notes. ?             ?               ? ?Differential  diagnosis includes, but is not limited to, septic arthritis, osteoarthritis, fracture, dislocation, crystal disease, COVID. ? ?Patient presented to the emergency department today with primary concern for left ankle pain.  Patient also with fevers.  Blood work does show a leukocytosis here.  Chemistry without any concerning abnormalities.  On exam patient does have a swollen and tender left ankle.  Also some tenderness to the right shoulder.  Aspiration was performed by IR.  Discussed results and findings with Dr. Sharlet Salina with orthopedics.  At this time low suspicion for septic arthritis given the low white blood cell count.  Somewhat unclear etiology however.  Given leukocytosis and fever will start broad-spectrum antibiotics.  In addition the patient's blood work was notable for continued anemia.  Patient states that she has been having normal menstrual period since her fibroidectomy.  Also showed schistocytes in the blood work.  I discussed with Dr. Rogue Bussing with heme-onc.  He recommended further tests.  Did not think it represented TTP at this time.  Will plan on starting broad spectrum antibiotics and admission to the hospitalist service.  ? ?FINAL CLINICAL IMPRESSION(S) / ED DIAGNOSES  ? ?Final diagnoses:  ?Acute left ankle pain  ?Fever, unspecified fever cause  ?Anemia, unspecified type  ? ? ?Note:  This document was prepared using Dragon voice recognition software and may include unintentional dictation errors. ? ?  ?Nance Pear, MD ?11/02/21 1537 ? ?

## 2021-11-02 NOTE — ED Notes (Signed)
Pt informed of need for urine sample at this time. Pt states they recently peed and do not need to at this time.  ?

## 2021-11-02 NOTE — Procedures (Signed)
PROCEDURE SUMMARY: ? ?Successful fluoroscopic guided left ankle joint aspiration. ?Yielded 5 mL of cloudy yellow fluid.  ?No immediate complications.  ?Pt tolerated well.  ? ?Specimen was sent for labs. ? ?EBL None. ? ?Tsosie Billing D PA-C ?11/02/2021 ?12:54 PM ? ? ? ?

## 2021-11-02 NOTE — Progress Notes (Addendum)
1649 ?Pt transferred in room via stretcher from ED and ambulated to bed. Ax4 Lungs clear on room air. VSS. 20G RC bolus infusing now. Bandid to L ankle CDI due to aspiration Collected in ED. States pain 10/10. Call bell and possessions within reach  ? ?1704 ?Lab tech in to draw at this time  ? ?1815 ?BOLUS completed at this time. IV vanc started. ? ?1906 ?Pt stated pain is 10/10 medicated with '2mg'$  PO dilaudid.  ? ? ?

## 2021-11-02 NOTE — ED Triage Notes (Signed)
Pt c/o starting with left foot pain Friday night and then started having right shoulder pain, and since last night having a fever 101, denies any injury or wounds. States she had a negative home covid test and at work. Pt is in NAD at present ? ?

## 2021-11-02 NOTE — H&P (Signed)
?History and Physical  ? ? ?[name] PJK:[id number] DOB: 01-Dec-1975 DOA: 11/02/2021 ? ?Referring MD/NP/PA:  ? ?PCP: Steele Sizer, MD  ? ?Patient coming from:  The patient is coming from home.  At baseline, pt is independent for most of ADL.       ? ?Chief Complaint: Left ankle and right shoulder pain ? ?HPI: Sharon Herring is a 46 y.o. female with medical history significant of uterine fibroid (s/p of myomectomy), iron deficiency anemia, menorrhagia, PE on Eliquis, migraine headache, polyp of stomach, GERD, who presents with left ankle and right shoulder pain. ? ?Patient has left ankle pain started 5 days ago, then started having right shoulder pain. The left ankle pain is sharp, 9 out of 10 in severity, constant, nonradiating.  The right shoulder pain is dull, mild. Both joints are swelling.  Associated with fever and chills.  Her body temperature is 100.3 today.  No injury. Patient denies chest pain, cough, shortness of breath.  No nausea, vomiting, diarrhea or abdominal pain.  No symptoms of UTI.  ? ?Of note, patient has history of iron deficiency anemia, required blood transfusion and IV iron infusion in the past.  She used to have chronic blood loss due to uterine fibroid. After she had myomectomy, her menstrual period has become normalized.  Currently patient does not have active vaginal bleeding.  No dark stool or rectal bleeding. ? ?Data Reviewed and ED Course: pt was found to have WBC 14.1, lactic acid 1.0, INR 1.0, negative pregnancy test, hemoglobin 7.1 (6.9 on 04/19/2021), ferritin level 22, negative COVID PCR, LDH 138, electrolytes renal function okay, temperature 100.3, blood pressure 107/74, heart rate 102, RR 18, oxygen saturation 100% on room air.  Chest x-ray negative.  X-ray of left ankle is negative for acute injury.  Left ankle aspiration was performed by IR. Synovial fluid from her left ankle showed yellow cloudy appearance, no crystals, WBC 823, neutrophil 83%, lymphocytes  5%, monocytes 12%, eosinophil 0.  ? ?Initial CBG showed presence of schistocytes, but pathology smear review showed no schistocytes. platelets are increased. There is severe microcytic anemia with hypochromia and mild poikilocytosis. Neutrophilic leukocytosis is present with minimal left shift. No blasts or abnormal lymphocytes are seen. The history, RBC morphology, and thrombocytosis are consistent with chronic iron deficiency anemia. Elevated neutrophil count appears to be reactive.   ? ?Patient is admitted to [place] bed as inpatient. Dr. Sharlet Salina of Ortho, Dr. Rogue Bussing of hematology, and Dr. Jefm Bryant of rheumatology are consulted by EDP. ? ?EKG: Not done in ED, will get one.    ? ? ?Review of Systems:  ? ?General: has fevers, chills, no body weight gain, has fatigue ?HEENT: no blurry vision, hearing changes or sore throat ?Respiratory: no dyspnea, coughing, wheezing ?CV: no chest pain, no palpitations ?GI: no nausea, vomiting, abdominal pain, diarrhea, constipation ?GU: no dysuria, burning on urination, increased urinary frequency, hematuria  ?Ext: no leg edema ?Neuro: no unilateral weakness, numbness, or tingling, no vision change or hearing loss ?Skin: no rash, no skin tear. ?MSK: has swelling and pain in left ankle and right shoulder. ?Heme: No easy bruising.  ?Travel history: No recent long distant travel. ? ? ?Allergy:  ?Allergies  ?Allergen Reactions  ? Aspirin Nausea And Vomiting  ? Norethindrone Shortness Of Breath and Rash  ? Oxycodone-Acetaminophen Rash and Shortness Of Breath  ? Lexapro [Escitalopram] Palpitations  ? Tape Other (See Comments)  ?  Burning feeling. Has left mark on skin.  ? Belviq [Lorcaserin Hcl]  Other (See Comments)  ?  headache  ? Dicyclomine Nausea And Vomiting  ? Contrave [Naltrexone-Bupropion Hcl Er] Anxiety  ?  Altered mental state  ? ? ?Past Medical History:  ?Diagnosis Date  ? Acute pulmonary embolism ([place])   ? Anemia   ? Dizziness   ? Fibroid uterus   ? GERD  (gastroesophageal reflux disease)   ? History of blood transfusion 02/26/2021  ? History of thrombocytosis   ? History of uterine fibroid 03/14/2014  ? myomectomy  ? IBS (irritable bowel syndrome)   ? Iron deficiency anemia 03/14/2014  ? Migraine   ? Polyp of stomach 01/06/2016  ? Stomach polyp, pyloric, consistent with prolapse.   ? ? ?Past Surgical History:  ?Procedure Laterality Date  ? APPENDECTOMY  2012  ? CHOLECYSTECTOMY    ? CHROMOPERTUBATION N/A 02/18/2021  ? Procedure: CHROMOPERTUBATION;  Surgeon: Rubie Maid, MD;  Location: [facility] ORS;  Service: Gynecology;  Laterality: N/A;  ? ESOPHAGOGASTRODUODENOSCOPY (EGD) WITH PROPOFOL N/A 01/06/2016  ? Procedure: ESOPHAGOGASTRODUODENOSCOPY (EGD) WITH PROPOFOL;  Surgeon: Robert Bellow, MD;  Location: [facility] ENDOSCOPY;  Service: Endoscopy;  Laterality: N/A;  ? HERNIA REPAIR    ? IR FLUORO GUIDED NEEDLE PLC ASPIRATION/INJECTION LOC  11/02/2021  ? ROBOT ASSISTED MYOMECTOMY N/A 02/18/2021  ? Procedure: XI ROBOTIC ASSISTED MYOMECTOMY;  Surgeon: Rubie Maid, MD;  Location: [facility] ORS;  Service: Gynecology;  Laterality: N/A;  ? ROBOTIC ASSISTED LAPAROSCOPIC CHOLECYSTECTOMY N/A 03/03/2016  ? Procedure: ROBOTIC ASSISTED LAPAROSCOPIC CHOLECYSTECTOMY ;  Surgeon: Clayburn Pert, MD;  Location: [facility] ORS;  Service: General;  Laterality: N/A;  ? UTERINE FIBROID SURGERY  2012  ? XI ROBOTIC ASSISTED VENTRAL HERNIA N/A 07/15/2019  ? Procedure: XI ROBOTIC ASSISTED VENTRAL HERNIA;  Surgeon: Jules Husbands, MD;  Location: [facility] ORS;  Service: General;  Laterality: N/A;  ? ? ?Social History:  reports that she has never smoked. She has never used smokeless tobacco. She reports that she does not drink alcohol and does not use drugs. ? ?Family History:  ?Family History  ?Problem Relation Age of Onset  ? Hypertension Mother   ? Hypertension Father   ? Pancreatic cancer Father 34  ? Breast cancer Neg Hx   ? Colon cancer Neg Hx   ?  ? ?Prior to Admission medications   ?Medication Sig Start Date End  Date Taking? Authorizing Provider  ?albuterol (PROVENTIL) (2.5 MG/3ML) 0.083% nebulizer solution Take 3 mLs (2.5 mg total) by nebulization every 6 (six) hours as needed for wheezing or shortness of breath. 06/20/19   Steele Sizer, MD  ?albuterol (VENTOLIN HFA) 108 (90 Base) MCG/ACT inhaler Inhale 2 puffs into the lungs every 6 (six) hours as needed for wheezing or shortness of breath. 09/02/20   Steele Sizer, MD  ?apixaban (ELIQUIS) 5 MG TABS tablet Take 1 tablet (5 mg total) by mouth 2 (two) times daily. 03/02/21   Steele Sizer, MD  ?docusate sodium (COLACE) 100 MG capsule Take 1 capsule (100 mg total) by mouth 2 (two) times daily. 02/19/21   Rubie Maid, MD  ?ferrous sulfate (FERROUSUL) 325 (65 FE) MG tablet Take 1 tablet (325 mg total) by mouth 3 (three) times daily with meals. 10/26/20   Rubie Maid, MD  ?fluticasone (FLONASE) 50 MCG/ACT nasal spray Place 2 sprays into both nostrils daily. ?Patient taking differently: Place 2 sprays into both nostrils daily as needed for allergies. 08/21/17   Sharion Balloon, FNP  ?furosemide (LASIX) 20 MG tablet Take 1 tablet (20 mg total) by mouth daily. ?Patient  taking differently: Take 20 mg by mouth daily as needed for fluid. 07/17/18   Steele Sizer, MD  ?leuprolide (LUPRON DEPOT, 34-MONTH,) 11.25 MG injection Inject 11.25 mg into the muscle every 3 (three) months. ?Patient not taking: Reported on 07/23/2021 02/26/21   Rubie Maid, MD  ?linaclotide Rolan Lipa) 290 MCG CAPS capsule Take 1 capsule (290 mcg total) by mouth daily before breakfast. 09/16/19   Steele Sizer, MD  ?Multiple Vitamins-Minerals (MULTIVITAMIN ADULT) TABS Take 1 tablet by mouth daily. 01/04/17   Steele Sizer, MD  ?RIVAROXABAN Alveda Reasons) VTE STARTER PACK (15 & 20 MG) Follow package directions: Take one '15mg'$  tablet by mouth twice a day. On day 22, switch to one '20mg'$  tablet once a day. Take with food. 02/22/21 02/22/21  Elodia Florence., MD  ? ? ?Physical Exam: ?Vitals:  ? 11/02/21 1215 11/02/21  1415 11/02/21 1545 11/02/21 1649  ?BP:  107/74 (!) 109/97 (!) 135/91  ?Pulse:  83 85 70  ?Resp:  '17 14 16  '$ ?Temp: 99.1 ?F (37.3 ?C)   98.9 ?F (37.2 ?C)  ?TempSrc: Oral   Oral  ?SpO2:  98% 100% 100%  ?Weight:    89.7 kg

## 2021-11-02 NOTE — Progress Notes (Signed)
?   11/02/21 2027  ?Assess: MEWS Score  ?Temp (!) 102.7 ?F (39.3 ?C) ?(md notified)  ?Assess: MEWS Score  ?MEWS Temp 2  ?MEWS Systolic 0  ?MEWS Pulse 0  ?MEWS RR 0  ?MEWS LOC 0  ?MEWS Score 2  ?MEWS Score Color Yellow  ?Assess: if the MEWS score is Yellow or Red  ?Were vital signs taken at a resting state? Yes  ?Focused Assessment No change from prior assessment  ?Does the patient meet 2 or more of the SIRS criteria? No  ?MEWS guidelines implemented *See Row Information* No, other (Comment) ?(given prn tylenol and patient on iv abx for septic arthritis)  ?Treat  ?MEWS Interventions Administered prn meds/treatments  ?Notify: Provider  ?Provider Name/Title Jaci Carrel, NP  ?Date Provider Notified 11/02/21  ?Time Provider Notified 2046  ?Notification Type Page  ?Notification Reason Change in status ?(fever of 102.7, yellow mews)  ?Provider response No new orders  ?Assess: SIRS CRITERIA  ?SIRS Temperature  1  ?SIRS Pulse 1  ?SIRS Respirations  0  ?SIRS WBC 0  ?SIRS Score Sum  2  ? ? ?

## 2021-11-03 ENCOUNTER — Inpatient Hospital Stay: Payer: Self-pay

## 2021-11-03 ENCOUNTER — Encounter: Payer: Self-pay | Admitting: Hematology and Oncology

## 2021-11-03 ENCOUNTER — Inpatient Hospital Stay (HOSPITAL_COMMUNITY)
Admit: 2021-11-03 | Discharge: 2021-11-03 | Disposition: A | Discharge status: Home / Self Care | Payer: PRIVATE HEALTH INSURANCE | Attending: Internal Medicine | Admitting: Internal Medicine

## 2021-11-03 DIAGNOSIS — R011 Cardiac murmur, unspecified: Secondary | ICD-10-CM

## 2021-11-03 DIAGNOSIS — Z86711 Personal history of pulmonary embolism: Secondary | ICD-10-CM | POA: Diagnosis not present

## 2021-11-03 DIAGNOSIS — M25572 Pain in left ankle and joints of left foot: Secondary | ICD-10-CM | POA: Diagnosis not present

## 2021-11-03 DIAGNOSIS — D500 Iron deficiency anemia secondary to blood loss (chronic): Secondary | ICD-10-CM | POA: Diagnosis not present

## 2021-11-03 DIAGNOSIS — M25511 Pain in right shoulder: Secondary | ICD-10-CM | POA: Diagnosis not present

## 2021-11-03 DIAGNOSIS — A5486 Gonococcal sepsis: Secondary | ICD-10-CM | POA: Diagnosis not present

## 2021-11-03 LAB — ECHOCARDIOGRAM COMPLETE
AR max vel: 2.31 cm2
AV Area VTI: 2.82 cm2
AV Area mean vel: 2.21 cm2
AV Mean grad: 5 mmHg
AV Peak grad: 8 mmHg
Ao pk vel: 1.41 m/s
Area-P 1/2: 3.83 cm2
Height: 65 in
MV VTI: 3.03 cm2
S' Lateral: 3 cm
Weight: 3164.04 oz

## 2021-11-03 LAB — BASIC METABOLIC PANEL
Anion gap: 7 (ref 5–15)
BUN: 8 mg/dL (ref 6–20)
CO2: 24 mmol/L (ref 22–32)
Calcium: 8.8 mg/dL — ABNORMAL LOW (ref 8.9–10.3)
Chloride: 105 mmol/L (ref 98–111)
Creatinine, Ser: 0.83 mg/dL (ref 0.44–1.00)
GFR, Estimated: >60 mL/min (ref >60)
Glucose, Bld: 90 mg/dL (ref 70–99)
Potassium: 4.1 mmol/L (ref 3.5–5.1)
Sodium: 136 mmol/L (ref 135–145)

## 2021-11-03 LAB — CBC
HCT: 23.3 % — ABNORMAL LOW (ref 36.0–46.0)
Hemoglobin: 6.5 g/dL — ABNORMAL LOW (ref 12.0–15.0)
MCH: 17.5 pg — ABNORMAL LOW (ref 26.0–34.0)
MCHC: 27.9 g/dL — ABNORMAL LOW (ref 30.0–36.0)
MCV: 62.8 fL — ABNORMAL LOW (ref 80.0–100.0)
Platelets: 436 10*3/uL — ABNORMAL HIGH (ref 150–400)
RBC: 3.71 MIL/uL — ABNORMAL LOW (ref 3.87–5.11)
RDW: 20.4 % — ABNORMAL HIGH (ref 11.5–15.5)
WBC: 13 10*3/uL — ABNORMAL HIGH (ref 4.0–10.5)
nRBC: 0 % (ref 0.0–0.2)

## 2021-11-03 LAB — PREPARE RBC (CROSSMATCH)

## 2021-11-03 LAB — HEPATITIS PANEL, ACUTE
HCV Ab: NONREACTIVE
Hep A IgM: NONREACTIVE
Hep B C IgM: NONREACTIVE
Hepatitis B Surface Ag: NONREACTIVE

## 2021-11-03 MED ORDER — DIPHENHYDRAMINE HCL 25 MG PO CAPS
25.0000 mg | ORAL_CAPSULE | Freq: Once | ORAL | Status: AC
  Administered 2021-11-03: 25 mg via ORAL
  Filled 2021-11-03: qty 1

## 2021-11-03 MED ORDER — SODIUM CHLORIDE 0.9 % IV SOLN
2.0000 g | INTRAVENOUS | Status: DC
  Administered 2021-11-03 – 2021-11-05 (×3): 2 g via INTRAVENOUS
  Filled 2021-11-03: qty 20
  Filled 2021-11-03 (×2): qty 2

## 2021-11-03 MED ORDER — SODIUM CHLORIDE 0.9% IV SOLUTION: Freq: Once | INTRAVENOUS | Status: AC

## 2021-11-03 NOTE — Consult Note (Signed)
ORTHOPAEDIC CONSULTATION  REQUESTING PHYSICIAN: Lewie Chamber, MD  Chief Complaint: Left ankle pain, right shoulder pain  HPI: Sharon Herring is a 46 y.o. female who is admitted with left ankle pain x 1 week, right shoulder pain. In the ED, she had T 100.53F and WBC 14K. Ankle was aspirated in the ER by IR (cell count 800+, 83% PMNs, cultures are NGTD). Ankle pain is somewhat improved today. She has right shoulder discomfort with forward flexion.  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  Patient is a Engineer, civil (consulting). She is accompanied by her husband today.  Past Medical History:  Diagnosis Date   Acute pulmonary embolism (HCC)    Anemia    Dizziness    Fibroid uterus    GERD (gastroesophageal reflux disease)    History of blood transfusion 02/26/2021   History of thrombocytosis    History of uterine fibroid 03/14/2014   myomectomy   IBS (irritable bowel syndrome)    Iron deficiency anemia 03/14/2014   Migraine    Polyp of stomach 01/06/2016   Stomach polyp, pyloric, consistent with prolapse.    Past Surgical History:  Procedure Laterality Date   APPENDECTOMY  2012   CHOLECYSTECTOMY     CHROMOPERTUBATION N/A 02/18/2021   Procedure: CHROMOPERTUBATION;  Surgeon: Hildred Laser, MD;  Location: [facility];  Service: Gynecology;  Laterality: N/A;   ESOPHAGOGASTRODUODENOSCOPY (EGD) WITH PROPOFOL N/A 01/06/2016   Procedure: ESOPHAGOGASTRODUODENOSCOPY (EGD) WITH PROPOFOL;  Surgeon: Earline Mayotte, MD;  Location: [facility];  Service: Endoscopy;  Laterality: N/A;   HERNIA REPAIR     IR FLUORO GUIDED NEEDLE PLC ASPIRATION/INJECTION LOC  11/02/2021   ROBOT ASSISTED MYOMECTOMY N/A 02/18/2021   Procedure: XI ROBOTIC ASSISTED MYOMECTOMY;  Surgeon: Hildred Laser, MD;  Location: [facility] OR[facility];  Service: Gynecology;  Laterality: N/A;   ROBOTIC ASSISTED LAPAROSCOPIC CHOLECYSTECTOMY N/A 03/03/2016   Procedure: ROBOTIC ASSISTED LAPAROSCOPIC CHOLECYSTECTOMY ;  Surgeon: Ricarda Frame, MD;  Location: [facility] OR[facility];  Service: General;  Laterality: N/A;   UTERINE FIBROID SURGERY  2012   XI ROBOTIC ASSISTED VENTRAL HERNIA N/A 07/15/2019   Procedure: XI ROBOTIC ASSISTED VENTRAL HERNIA;  Surgeon: Leafy Ro, MD;  Location: [facility];  Service: General;  Laterality: N/A;   Social History   Socioeconomic History   Marital status: Married    Spouse name: Not on file   Number of children: 0   Years of education: Not on file   Highest education level: Bachelor's degree (e.g., BA, AB, BS)  Occupational History   Occupation: CMA  Tobacco Use   Smoking status: Never   Smokeless tobacco: Never  Vaping Use   Vaping Use: Never used  Substance and Sexual Activity   Alcohol use: No    Alcohol/week: 0.0 standard drinks   Drug use: No   Sexual activity: Not Currently    Partners: Male    Birth control/protection: None  Other Topics Concern   Not on file  Social History Narrative   Lives alone, works at [employer]    Social Determinants of Corporate investment banker Strain: Low Risk    Difficulty of Paying Living Expenses: Not hard at all  Food Insecurity: No Food Insecurity   Worried About Programme researcher, broadcasting/film/video in the Last Year: Never true   Barista in the Last Year: Never true  Transportation Needs: No Transportation Needs   Lack of Transportation (Medical): No   Lack of Transportation (Non-Medical): No  Physical Activity: Insufficiently  Active   Days of Exercise per Week: 2 days   Minutes of Exercise per Session: 30 min  Stress: No Stress Concern Present   Feeling of Stress : Only a little  Social Connections: Press photographer of Communication with Friends and Family: More than three times a week   Frequency of Social Gatherings with Friends and Family: More than three times a week   Attends Religious Services: More than 4 times per year   Active Member of [employer] or Organizations: Yes   Attends Banker Meetings: 1 to 4  times per year   Marital Status: Married   Family History  Problem Relation Age of Onset   Hypertension Mother    Hypertension Father    Pancreatic cancer Father 56   Breast cancer Neg Hx    Colon cancer Neg Hx    Allergies  Allergen Reactions   Aspirin Nausea And Vomiting   Norethindrone Shortness Of Breath and Rash   Oxycodone-Acetaminophen Rash and Shortness Of Breath   Lexapro [Escitalopram] Palpitations   Tape Other (See Comments)    Burning feeling. Has left mark on skin.   Belviq [Lorcaserin Hcl] Other (See Comments)    headache   Dicyclomine Nausea And Vomiting   Contrave [Naltrexone-Bupropion Hcl Er] Anxiety    Altered mental state   Prior to Admission medications   Medication Sig Start Date End Date Taking? Authorizing Provider  apixaban (ELIQUIS) 5 MG TABS tablet Take 1 tablet (5 mg total) by mouth 2 (two) times daily. 03/02/21  Yes Sowles, Danna Hefty, MD  letrozole [facility]) 2.5 MG tablet Take 2.5 mg by mouth as directed. 10/21/21  Yes [provider]  Multiple Vitamins-Minerals (MULTIVITAMIN ADULT) TABS Take 1 tablet by mouth daily. 01/04/17  Yes Sowles, Danna Hefty, MD  albuterol (VENTOLIN HFA) 108 (90 Base) MCG/ACT inhaler Inhale 2 puffs into the lungs every 6 (six) hours as needed for wheezing or shortness of breath. 09/02/20   Alba Cory, MD  ferrous sulfate (FERROUSUL) 325 (65 FE) MG tablet Take 1 tablet (325 mg total) by mouth 3 (three) times daily with meals. Patient not taking: Reported on 11/02/2021 10/26/20   Hildred Laser, MD  leuprolide (LUPRON DEPOT, 49-MONTH,) 11.25 MG injection Inject 11.25 mg into the muscle every 3 (three) months. Patient not taking: Reported on 07/23/2021 02/26/21   Hildred Laser, MD  RIVAROXABAN Carlena Hurl) VTE STARTER PACK (15 & 20 MG) Follow package directions: Take one 15mg  tablet by mouth twice a day. On day 22, switch to one 20mg  tablet once a day. Take with food. 02/22/21 02/22/21  Zigmund Daniel., MD   DG Chest 2 View  Result  Date: 11/02/2021 CLINICAL DATA:  fever EXAM: CHEST - 2 VIEW COMPARISON:  03/04/2021 FINDINGS: No consolidation. No visible pleural effusions or pneumothorax. Cardiomediastinal silhouette is within normal limits and similar to prior. No evidence of acute osseous abnormality. IMPRESSION: No evidence of acute cardiopulmonary disease. Electronically Signed   By: Feliberto Harts M.D.   On: 11/02/2021 09:00   DG Shoulder Right  Result Date: 11/03/2021 CLINICAL DATA:  Right shoulder pain for several days EXAM: RIGHT SHOULDER - 2+ VIEW COMPARISON:  Chest radiograph 11/02/2021 FINDINGS: There is no evidence of fracture or dislocation. There is no evidence of arthropathy or other focal bone abnormality. Soft tissues are unremarkable. IMPRESSION: 1. No significant abnormality identified. If shoulder pain proves refractory to conservative therapy, MRI may be warranted for further characterization. Electronically Signed   By: Zollie Beckers  Ova Freshwater M.D.   On: 11/03/2021 15:06   DG Ankle Complete Left  Result Date: 11/02/2021 CLINICAL DATA:  Pain and swelling EXAM: LEFT ANKLE COMPLETE - 3+ VIEW COMPARISON:  None. FINDINGS: No recent fracture or dislocation is seen. There is 7 mm smooth marginated calcification adjacent to the tip of medial malleolus. There is soft tissue swelling around the ankle. There are no opaque foreign bodies. IMPRESSION: No recent fracture or dislocation is seen. There are no focal lytic lesions. 7 mm smooth marginated calcification adjacent to the tip of medial malleolus may be residual from previous injury. Electronically Signed   By: Ernie Avena M.D.   On: 11/02/2021 10:56   IR Fluoro Guide Ndl Plmt / BX  Result Date: 11/02/2021 INDICATION: Patient complains of left ankle pain and swelling with no known injury and denies any previous surgeries. Patient also complaining of fevers, request received for left ankle joint aspiration. EXAM: LEFT ANKLE JOINT ASPIRATION UNDER FLUOROSCOPY  MEDICATIONS: Local 1% lidocaine only. ANESTHESIA/SEDATION: Local 1% lidocaine only. FLUOROSCOPY: Radiation Exposure Index (as provided by the fluoroscopic device): 0.20 mGy Kerma PROCEDURE: Risks and benefits of the procedure were discussed today, all questions were answered and informed written consent was obtained from the patient/patient's legal guardian prior to the procedure. Potential complications that were discussed included, but are not limited to: introduction of infection, bleeding, inability to aspirate joint fluid necessitating lavage, and inability to aspirate the lavage fluid (if used). The high likelihood of a successful procedure was discussed. The patient/patient's legal guardian voiced understanding of these risks and agreed to proceed. The usual time-out protocol was performed immediately prior to the procedure. The appropriate skin site for needle placement was chosen with the aid of fluoroscopy. Using the usual sterile technique with chlorhexidine and lidocaine 1% as local anesthetic, a 22 gauge needle was advanced into the left ankle joint using loss of resistance technique while injecting the Lidocaine. The tip of the needle within the joint was confirmed with a fluoroscopic spot image and approximately 5 cc of cloudy yellow fluid was aspirated and sent to the laboratory for the requested studies. Following successful aspiration, injection of 1 mL of sterile iodinated contrast was injected to confirm intra-articular position of aspirated fluid. The needle was removed in its entirety and a sterile bandage was applied. The patient tolerated the procedure well without apparent immediate complications and was discharged from the radiology department in good condition. COMPLICATIONS: COMPLICATIONS None. IMPRESSION: 1. Technically successful fluoroscopic guided left ankle joint aspiration. This exam was performed by Pattricia Boss PA-C, and was supervised and interpreted by Dr. Allena Katz. Electronically  Signed   By: Elige Ko M.D.   On: 11/02/2021 13:10   ECHOCARDIOGRAM COMPLETE  Result Date: 11/03/2021    ECHOCARDIOGRAM REPORT   Patient Name:   Sharon Herring Date of Exam: 11/03/2021 Medical Rec #:  [id number]            Height:       65.0 in Accession #:    [id number]           Weight:       197.8 lb Date of Birth:  1976-01-17            BSA:          1.969 m Patient Age:    45 years             BP:           120/74 mmHg Patient Gender: F  HR:           96 bpm. Exam Location:  [facility] Procedure: 2D Echo, Cardiac Doppler and Color Doppler Indications:     Murmur R01.1  History:         Patient has prior history of Echocardiogram examinations, most                  recent 02/20/2021. Dizziness, acute pulmonary embolism.  Sonographer:     Cristela Blue Referring Phys:  [phone] DAVID GIRGUIS Diagnosing Phys: Debbe Odea MD IMPRESSIONS  1. Left ventricular ejection fraction, by estimation, is 60 to 65%. The left ventricle has normal function. The left ventricle has no regional wall motion abnormalities. Left ventricular diastolic parameters were normal.  2. Right ventricular systolic function is normal. The right ventricular size is normal.  3. The mitral valve is normal in structure. No evidence of mitral valve regurgitation.  4. The aortic valve is tricuspid. Aortic valve regurgitation is not visualized. FINDINGS  Left Ventricle: Left ventricular ejection fraction, by estimation, is 60 to 65%. The left ventricle has normal function. The left ventricle has no regional wall motion abnormalities. The left ventricular internal cavity size was normal in size. There is  no left ventricular hypertrophy. Left ventricular diastolic parameters were normal. Right Ventricle: The right ventricular size is normal. No increase in right ventricular wall thickness. Right ventricular systolic function is normal. Left Atrium: Left atrial size was normal in size. Right Atrium: Right atrial size was normal in  size. Pericardium: There is no evidence of pericardial effusion. Mitral Valve: The mitral valve is normal in structure. No evidence of mitral valve regurgitation. MV peak gradient, 6.4 mmHg. The mean mitral valve gradient is 3.0 mmHg. Tricuspid Valve: The tricuspid valve is normal in structure. Tricuspid valve regurgitation is not demonstrated. Aortic Valve: The aortic valve is tricuspid. Aortic valve regurgitation is not visualized. Aortic valve mean gradient measures 5.0 mmHg. Aortic valve peak gradient measures 8.0 mmHg. Aortic valve area, by VTI measures 2.82 cm. Pulmonic Valve: The pulmonic valve was normal in structure. Pulmonic valve regurgitation is trivial. Aorta: The aortic root is normal in size and structure. Venous: The inferior vena cava was not well visualized. IAS/Shunts: No atrial level shunt detected by color flow Doppler.  LEFT VENTRICLE PLAX 2D LVIDd:         5.50 cm   Diastology LVIDs:         3.00 cm   LV e' medial:    9.57 cm/s LV PW:         1.00 cm   LV E/e' medial:  11.8 LV IVS:        0.65 cm   LV e' lateral:   11.30 cm/s LVOT diam:     2.10 cm   LV E/e' lateral: 10.0 LV SV:         60 LV SV Index:   31 LVOT Area:     3.46 cm  RIGHT VENTRICLE RV Basal diam:  3.30 cm RV S prime:     14.50 cm/s TAPSE (M-mode): 3.3 cm LEFT ATRIUM             Index        RIGHT ATRIUM           Index LA diam:        3.90 cm 1.98 cm/m   RA Area:     16.70 cm LA Vol (A2C):   69.5 ml 35.30 ml/m  RA Volume:  46.90 ml  23.82 ml/m LA Vol (A4C):   42.8 ml 21.74 ml/m LA Biplane Vol: 56.8 ml 28.85 ml/m  AORTIC VALVE                     PULMONIC VALVE AV Area (Vmax):    2.31 cm      PV Vmax:        1.02 m/s AV Area (Vmean):   2.21 cm      PV Vmean:       65.800 cm/s AV Area (VTI):     2.82 cm      PV VTI:         0.187 m AV Vmax:           141.00 cm/s   PV Peak grad:   4.2 mmHg AV Vmean:          101.000 cm/s  PV Mean grad:   2.0 mmHg AV VTI:            0.214 m       RVOT Peak grad: 6 mmHg AV Peak Grad:       8.0 mmHg AV Mean Grad:      5.0 mmHg LVOT Vmax:         94.00 cm/s LVOT Vmean:        64.500 cm/s LVOT VTI:          0.174 m LVOT/AV VTI ratio: 0.81  AORTA Ao Root diam: 3.23 cm MITRAL VALVE                TRICUSPID VALVE MV Area (PHT): 3.83 cm     TR Peak grad:   9.6 mmHg MV Area VTI:   3.03 cm     TR Vmax:        155.00 cm/s MV Peak grad:  6.4 mmHg MV Mean grad:  3.0 mmHg     SHUNTS MV Vmax:       1.26 m/s     Systemic VTI:  0.17 m MV Vmean:      86.8 cm/s    Systemic Diam: 2.10 cm MV Decel Time: 198 msec     Pulmonic VTI:  0.197 m MV E velocity: 113.00 cm/s MV A velocity: 91.30 cm/s MV E/A ratio:  1.24 Debbe Odea MD Electronically signed by Debbe Odea MD Signature Date/Time: 11/03/2021/4:29:51 PM    Final     Positive ROS: All other systems have been reviewed and were otherwise negative with the exception of those mentioned in the HPI and as above.  Physical Exam: General: Alert, no acute distress Cardiovascular: No pedal edema Respiratory: No cyanosis, no use of accessory musculature GI: No organomegaly, abdomen is soft and non-tender Skin: No lesions in the area of chief complaint Neurologic: Sensation intact distally Psychiatric: Patient is competent for consent with normal mood and affect Lymphatic: No axillary or cervical lymphadenopathy  MUSCULOSKELETAL:   Right shoulder: tenderness to palpation of anterior shoulder, pain with active ROM and flexion above 40 degrees, tolerates passive FF to 70 deg, tolerates passive IR/ER  Left ankle: moderate effusion, active and passive ROM limited due to pain, cap refill < 2 sec   Assessment: 45yo F with 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 Daiva Eves  that the patient's joint pain should  improve with IV Ceftriaxone    Ross Marcus, MD    11/03/2021 5:10 PM

## 2021-11-03 NOTE — TOC Initial Note (Addendum)
Transition of Care (TOC) - Initial/Assessment Note  ? ? ?Patient Details  ?Name: Sharon Herring ?MRN: [id number] ?Date of Birth: 11-05-1975 ? ?Transition of Care (TOC) CM/SW Contact:    ?Conception Oms, RN ?Phone Number: ?11/03/2021, 8:44 AM ? 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 ?Expected Discharge Plan and Services ?  ?  ?  ?  ?  ?                ?  ?  ?  ?  ?  ?  ?  ?  ?  ?  ? ?Prior Living Arrangements/Services ?  ?  ?  ?       ?  ?  ?  ?  ? ?Activities of Daily Living ?Home Assistive Devices/Equipment: None ?ADL Screening (condition at time of admission) ?Patient's cognitive ability adequate to safely complete daily activities?: Yes ?Is the patient deaf or have difficulty hearing?: No ?Does the patient have difficulty seeing, even when wearing glasses/contacts?: No ?Does the patient have difficulty concentrating, remembering, or making decisions?: No ?Patient able to express need for assistance with ADLs?: Yes ?Does the patient have difficulty dressing or bathing?: No ?Independently performs ADLs?: Yes (appropriate for developmental age) ?Does the patient have difficulty walking or climbing stairs?: Yes (pain to L ankle) ?Weakness of Legs: Left ?Weakness of Arms/Hands: Right ? ?Permission Sought/Granted ?  ?  ?   ?   ?   ?   ? ?Emotional Assessment ?  ?  ?  ?  ?  ?  ? ?Admission diagnosis:  Left ankle pain [M25.572] ?Fever, unspecified fever cause [R50.9] ?Acute left ankle pain [M25.572] ?Anemia, unspecified type [D64.9] ?Patient Active Problem List  ? Diagnosis Date Noted  ? Left ankle pain 11/02/2021  ? Right shoulder pain 11/02/2021  ? History of pulmonary embolism 11/02/2021  ? SIRS (systemic inflammatory  response syndrome) ([place]) 11/02/2021  ? History of blood transfusion 02/26/2021  ? Acute pulmonary embolism ([place])   ? S/P myomectomy 02/19/2021  ? Anemia associated with acute blood loss 02/17/2021  ? Symptomatic anemia 10/26/2020  ? Lightheadedness 04/15/2020  ? Liver lesion 01/15/2020  ? Hiatal hernia 01/15/2020  ? Lymphadenopathy 01/16/2018  ? Moderate obstructive sleep apnea 12/19/2017  ? Swelling of limb 01/27/2017  ? B12 deficiency 03/28/2016  ? Vitamin D deficiency 03/28/2016  ? Hyperglycemia 03/28/2016  ? Multiple gastric polyps 01/08/2016  ? Menorrhagia 11/13/2015  ? Chronic constipation 09/23/2015  ? Obesity, Class I, BMI 30.0-34.9 (see actual BMI) 06/02/2015  ? Iron deficiency anemia 03/14/2014  ? History of uterine fibroid 03/14/2014  ? ?PCP:  Steele Sizer, MD ?Pharmacy:   ?[facility] [place] [address] ?[place] [address] ?Phone: [phone] Fax: [phone] ? ?[facility] ?[address] ?[place] [address] ?Phone: [phone] Fax: [phone] ? ? ? ? ?Social Determinants of Health (SDOH) Interventions ?  ? ?Readmission Risk Interventions ?No flowsheet data found. ? ? ?

## 2021-11-03 NOTE — Consult Note (Signed)
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 Her husband who is at the bedside and who she agreed to discuss her care in front of has also been tested and treated for gonorrhea. I have strongly recommended HIV preexposure prophylaxis to the patient and I hope that she will consider it.  I will not be coming to the hospital at [facility] in person tomorrow but will come in person on Friday.   Principal Problem:   DGI (disseminated gonococcal infection) (HCC) Active Problems:   Iron deficiency anemia   Left ankle pain   Right shoulder pain   History of pulmonary embolism   Sepsis (HCC)   Scheduled Meds:  sodium chloride   Intravenous Once   ferrous sulfate  325 mg Oral TID WC   multivitamin with minerals  1 tablet Oral Daily   Continuous Infusions:  cefTRIAXone (ROCEPHIN)  IV 2 g (11/03/21 1712)   PRN Meds:.acetaminophen, albuterol, fentaNYL (SUBLIMAZE) injection, HYDROmorphone  HPI: Sharon Herring is a 46 y.o. female with history of uterine fibroid menorrhagia pulmonary embolism on Eliquis migraine headaches who presented with left ankle pain that started 5 days prior to admission that became increasingly severe and sharp and rated 9 out of 10 in severity.  She also developed shoulder pain as well in the interval with associated swelling.  She did not appreciate subjective fevers and chills and had no nausea or vomitings.  Her temperature though was 100.3 in the emergency department.  Her left ankle was aspirated by interventional radiology with 823 white blood cells found in 83% neutrophils.  No organisms seen  on Gram stain.  Rheumatology were consulted and they recommended testing for gonorrhea and chlamydia and she indeed tested positive for gonorrhea.  I do not understand why she was then placed on vancomycin and cefepime but she has now been switched over to ceftriaxone.  She should continue to receive systemic ceftriaxone until we are seeing resolution of her swollen joints and at that point time we can stepdown to oral therapy.  She should not need any orthopedic surgical intervention.  I am not in favor of placing a PICC line as she should be able to be cured with a course of IV antibiotics versus intramuscular antibiotics with stepdown to oral therapy with improvement.  She knows that partners need to be tested and treated.  Her husband has been treated and tested.  Hopefully he has also been tested for HIV.  This patient is HIV seronegative.  I will check an HIV RNA also check an RPR.  I have strongly recommended HIV preexposure prophylaxis to the patient.  She is already at higher risk for HIV unfortunately given the fact that she lives in the [place] and is a black and/or African-American descent.  The fact that she now has had gonococcal infection makes me concerned about risk of her acquiring other sexually transmitted infections in particular HIV.  I reviewed the current modalities for HIV preexposure prophylaxis for women including Truvada orally and my preferred regimen for patients which is Apretude.  I spent 82 minutes with the  patient including than 50% of the time in face to face counseling of the patient regarding her disseminated gonococcal infection concerns about risk for HIV acquisition and benefits of preexposure prophylaxis personally reviewing plain films and updated culture data along with review of medical records in preparation for the visit and during the visit and in coordination of her care.    Review of Systems: Review of Systems  Constitutional:  Positive  for fever. Negative for chills, malaise/fatigue and weight loss.  HENT:  Negative for congestion and sore throat.   Eyes:  Negative for blurred vision and photophobia.  Respiratory:  Negative for cough, shortness of breath and wheezing.   Cardiovascular:  Negative for chest pain, palpitations and leg swelling.  Gastrointestinal:  Negative for abdominal pain, blood in stool, constipation, diarrhea, heartburn, melena, nausea and vomiting.  Genitourinary:  Negative for dysuria, flank pain and hematuria.  Musculoskeletal:  Positive for joint pain and myalgias. Negative for back pain and falls.  Skin:  Negative for itching and rash.  Neurological:  Negative for dizziness, focal weakness, loss of consciousness, weakness and headaches.  Endo/Heme/Allergies:  Does not bruise/bleed easily.  Psychiatric/Behavioral:  Negative for depression and suicidal ideas. The patient does not have insomnia.    Past Medical History:  Diagnosis Date   Acute pulmonary embolism (HCC)    Anemia    Dizziness    Fibroid uterus    GERD (gastroesophageal reflux disease)    History of blood transfusion 02/26/2021   History of thrombocytosis    History of uterine fibroid 03/14/2014   myomectomy   IBS (irritable bowel syndrome)    Iron deficiency anemia 03/14/2014   Migraine    Polyp of stomach 01/06/2016   Stomach polyp, pyloric, consistent with prolapse.     Social History   Tobacco Use   Smoking status: Never   Smokeless tobacco: Never  Vaping Use   Vaping Use: Never used  Substance Use Topics   Alcohol use: No    Alcohol/week: 0.0 standard drinks   Drug use: No    Family History  Problem Relation Age of Onset   Hypertension Mother    Hypertension Father    Pancreatic cancer Father 63   Breast cancer Neg Hx    Colon cancer Neg Hx    Allergies  Allergen Reactions   Aspirin Nausea And Vomiting   Norethindrone Shortness Of Breath and Rash   Oxycodone-Acetaminophen Rash and Shortness Of Breath    Lexapro [Escitalopram] Palpitations   Tape Other (See Comments)    Burning feeling. Has left mark on skin.   Belviq [Lorcaserin Hcl] Other (See Comments)    headache   Dicyclomine Nausea And Vomiting   Contrave [Naltrexone-Bupropion Hcl Er] Anxiety    Altered mental state    OBJECTIVE: Blood pressure (!) 141/85, pulse 98, temperature (!) 101 F (38.3 C), resp. rate 16, height 5\' 5"  (1.651 m), weight 89.7 kg, SpO2 100 %.  Physical Exam Constitutional:      General: She is not in acute distress.    Appearance: Normal appearance. She is well-developed. She is not ill-appearing or diaphoretic.  HENT:     Head: Normocephalic and atraumatic.     Right Ear: Hearing and external ear normal.     Left Ear: Hearing and external ear normal.     Nose: No nasal deformity or rhinorrhea.  Eyes:     General: No scleral icterus.    Conjunctiva/sclera: Conjunctivae normal.     Right eye: Right conjunctiva  is not injected.     Left eye: Left conjunctiva is not injected.     Pupils: Pupils are equal, round, and reactive to light.  Neck:     Vascular: No JVD.  Cardiovascular:     Rate and Rhythm: Normal rate and regular rhythm.     Heart sounds: S1 normal and S2 normal.  Pulmonary:     Effort: Pulmonary effort is normal. No respiratory distress.     Breath sounds: No wheezing.  Abdominal:     General: Bowel sounds are normal. There is no distension.     Palpations: Abdomen is soft.     Tenderness: There is no abdominal tenderness.  Musculoskeletal:     Right shoulder: Decreased range of motion.     Left shoulder: Normal.     Cervical back: Normal range of motion and neck supple.     Right hip: Normal.     Left hip: Normal.     Right knee: Normal.     Left knee: Normal.     Left ankle: Swelling present. Tenderness present. Decreased range of motion.  Lymphadenopathy:     Head:     Right side of head: No submandibular, preauricular or posterior auricular adenopathy.     Left side of  head: No submandibular, preauricular or posterior auricular adenopathy.     Cervical: No cervical adenopathy.     Right cervical: No superficial or deep cervical adenopathy.    Left cervical: No superficial or deep cervical adenopathy.  Skin:    General: Skin is warm and dry.     Coloration: Skin is not pale.     Findings: No abrasion, bruising, ecchymosis, erythema, lesion or rash.     Nails: There is no clubbing.  Neurological:     General: No focal deficit present.     Mental Status: She is alert and oriented to person, place, and time.     Sensory: No sensory deficit.     Coordination: Coordination normal.     Gait: Gait normal.  Psychiatric:        Attention and Perception: Attention and perception normal. She is attentive.        Mood and Affect: Mood is depressed.        Speech: Speech normal.        Behavior: Behavior normal. Behavior is cooperative.        Thought Content: Thought content normal.        Cognition and Memory: Cognition and memory normal.        Judgment: Judgment normal.    Lab Results Lab Results  Component Value Date   WBC 13.0 (H) 11/03/2021   HGB 6.5 (L) 11/03/2021   HCT 23.3 (L) 11/03/2021   MCV 62.8 (L) 11/03/2021   PLT 436 (H) 11/03/2021    Lab Results  Component Value Date   CREATININE 0.83 11/03/2021   BUN 8 11/03/2021   NA 136 11/03/2021   K 4.1 11/03/2021   CL 105 11/03/2021   CO2 24 11/03/2021    Lab Results  Component Value Date   ALT 24 11/02/2021   AST 23 11/02/2021   ALKPHOS 70 11/02/2021   BILITOT 0.5 11/02/2021     Microbiology: Recent Results (from the past 240 hour(s))  Chlamydia/NGC rt PCR ([facility] only)     Status: Abnormal   Collection Time: 11/02/21  8:15 AM   Specimen: Urine  Result Value Ref Range Status   Specimen source GC/Chlam URINE, RANDOM  Final   Chlamydia Tr NOT DETECTED NOT DETECTED Final   N gonorrhoeae DETECTED (A) NOT DETECTED Final    Comment: (NOTE) This CT/NG assay has not been evaluated in  patients with a history of  hysterectomy. Performed at [facility], [address] [place], [place] [address]   Resp Panel by RT-PCR (Flu A&B, Covid) Nasopharyngeal Swab     Status: None   Collection Time: 11/02/21  9:00 AM   Specimen: Nasopharyngeal Swab; Nasopharyngeal(NP) swabs in vial transport medium  Result Value Ref Range Status   SARS Coronavirus 2 by RT PCR NEGATIVE NEGATIVE Final    Comment: (NOTE) SARS-CoV-2 target nucleic acids are NOT DETECTED.  The SARS-CoV-2 RNA is generally detectable in upper respiratory specimens during the acute phase of infection. The lowest concentration of SARS-CoV-2 viral copies this assay can detect is 138 copies/mL. A negative result does not preclude SARS-Cov-2 infection and should not be used as the sole basis for treatment or other patient management decisions. A negative result may occur with  improper specimen collection/handling, submission of specimen other than nasopharyngeal swab, presence of viral mutation(s) within the areas targeted by this assay, and inadequate number of viral copies(<138 copies/mL). A negative result must be combined with clinical observations, patient history, and epidemiological information. The expected result is Negative.  Fact Sheet for Patients:  BloggerCourse.com  Fact Sheet for Healthcare Providers:  SeriousBroker.it  This test is no t yet approved or cleared by the [place] FDA and  has been authorized for detection and/or diagnosis of SARS-CoV-2 by FDA under an Emergency Use Authorization (EUA). This EUA will remain  in effect (meaning this test can be used) for the duration of the COVID-19 declaration under Section 564(b)(1) of the Act, 21 U.S.C.section 360bbb-3(b)(1), unless the authorization is terminated  or revoked sooner.       Influenza A by PCR NEGATIVE NEGATIVE Final   Influenza B by PCR NEGATIVE NEGATIVE Final     Comment: (NOTE) The Xpert Xpress SARS-CoV-2/FLU/RSV plus assay is intended as an aid in the diagnosis of influenza from Nasopharyngeal swab specimens and should not be used as a sole basis for treatment. Nasal washings and aspirates are unacceptable for Xpert Xpress SARS-CoV-2/FLU/RSV testing.  Fact Sheet for Patients: BloggerCourse.com  Fact Sheet for Healthcare Providers: SeriousBroker.it  This test is not yet approved or cleared by the [place] FDA and has been authorized for detection and/or diagnosis of SARS-CoV-2 by FDA under an Emergency Use Authorization (EUA). This EUA will remain in effect (meaning this test can be used) for the duration of the COVID-19 declaration under Section 564(b)(1) of the Act, 21 U.S.C. section 360bbb-3(b)(1), unless the authorization is terminated or revoked.  Performed at [facility], [address] [place], [place] [address]   Body fluid culture w Gram Stain     Status: None (Preliminary result)   Collection Time: 11/02/21 11:54 AM   Specimen: Ankle; Body Fluid  Result Value Ref Range Status   Specimen Description   Final    ANKLE LEFT Performed at [facility], [address] [place], [place] [address]    Special Requests   Final    NONE Performed at [facility], [address] [place], [place] [address]    Gram Stain   Final    ABUNDANT WBC PRESENT,BOTH PMN AND MONONUCLEAR NO ORGANISMS SEEN Performed at [facility], [address] [place], [place] [address]    Culture PENDING  Incomplete  Report Status PENDING  Incomplete  Blood culture (routine x 2)     Status: None (Preliminary result)   Collection Time: 11/02/21 12:10 PM   Specimen: BLOOD  Result Value Ref Range Status   Specimen Description BLOOD LEFT AC  Final   Special Requests   Final    BOTTLES DRAWN AEROBIC AND ANAEROBIC Blood Culture adequate volume   Culture   Final     NO GROWTH < 24 HOURS Performed at [facility], [address] [place], [place] [address]    Report Status PENDING  Incomplete  Blood culture (routine x 2)     Status: None (Preliminary result)   Collection Time: 11/02/21 12:10 PM   Specimen: BLOOD  Result Value Ref Range Status   Specimen Description BLOOD RIGTH FA  Final   Special Requests   Final    BOTTLES DRAWN AEROBIC AND ANAEROBIC Blood Culture adequate volume   Culture   Final    NO GROWTH < 24 HOURS Performed at [facility], [address], [place], [place] [address]    Report Status PENDING  Incomplete    Acey Lav, MD [facility] [phone] pager  11/03/2021, 8:04 PM

## 2021-11-03 NOTE — Assessment & Plan Note (Signed)
-   Febrile, tachycardia, leukocytosis.  Source due to gonococcal infection ?- see DGI ?

## 2021-11-03 NOTE — Assessment & Plan Note (Addendum)
-   Okay to resume Eliquis at discharge ?

## 2021-11-03 NOTE — Hospital Course (Addendum)
[name] is a 46 yo female with PMH PE (on chronic Eliquis), uterine fibroids with chronic IDA who presented with left ankle and right shoulder pain.  She endorsed that symptoms have been going on for approximately 1 week prior to admission.  She also endorsed that she was treated for a possible yeast infection approximately 2 weeks ago with a dose of fluconazole outpatient.  She had reported that her left ankle pain was much worse compared to the right shoulder. ?She underwent further work-up on admission including left ankle arthrocentesis which yielded 5 cc cloudy yellow fluid.  Case was also discussed with rheumatology and she was recommended to undergo testing for GC/CHL which was positive for gonococcus.  She had initially been started on vancomycin and cefepime.  After positive gonococcus test, she was transitioned to Rocephin.  ID was also consulted for further input given disseminated gonococcal infection. ?She was recommended to continue on Rocephin and will follow-up outpatient with ID for further reevaluation in case of extension of course. ?

## 2021-11-03 NOTE — Assessment & Plan Note (Addendum)
-   History of iron deficiency anemia due to uterine fibroids ?- Iron stores again low ?-She was given a dose of Feraheme on 11/02/2021.  ?- She is also getting 1 unit of PRBC on 11/03/2021 due to worsened hemoglobin down to 6.5 g/dL ?-Hemoglobin improving throughout remainder of hospitalization.  Resume home iron at discharge ?

## 2021-11-03 NOTE — Progress Notes (Signed)
Cross Cover ?Benadryl ordered for pretransfusion per patient request she normall gets with transfusions ?

## 2021-11-03 NOTE — Progress Notes (Signed)
?Progress Note ? ? ? ?Wallis Bamberg Bobb   ?EQA:[id number]  ?DOB: [birth year]  ?DOA: 11/02/2021     1 ?PCP: Steele Sizer, MD ? ?Initial CC: left ankle pain and right shoulder pain ? ?Hospital Course: ?Ms. Dicenso is a 46 yo female with PMH PE (on chronic Eliquis), uterine fibroids with chronic IDA who presented with left ankle and right shoulder pain.  She endorsed that symptoms have been going on for approximately 1 week prior to admission.  She also endorsed that she was treated for a possible yeast infection approximately 2 weeks ago with a dose of fluconazole outpatient.  She had reported that her left ankle pain was much worse compared to the right shoulder. ?She underwent further work-up on admission including left ankle arthrocentesis which yielded 5 cc cloudy yellow fluid.  Case was also discussed with rheumatology and she was recommended to undergo testing for GC/CHL which was positive for gonococcus.  She had initially been started on vancomycin and cefepime.  After positive gonococcus test, she was transitioned to Rocephin.  ID was also consulted for further input given disseminated gonococcal infection. ? ?Interval History:  ?Seen in her room privately this morning.  I asked husband to step outside while I discussed test results with patient and reviewed further work-up being commenced.  She understands the need for informing any partners as well. ? ?Assessment and Plan: ?* DGI (disseminated gonococcal infection) ([place]) ?- positive NG test on admission. Given the left ankle and right shoulder pain with presumed infected joint, consistent with disseminated infection ?- d/c vanc and cefepime ?- start rocephin ?- subtle LUSB 2/6 murmur appreciated; will order echo to evaluate ?- ID consulted as well ?- also seen by rheumatology; greatly appreciate assistance; check right shoulder xray to start ?- will consult ortho to see if joints need further washing out vs continue abx ?- I have also discussed results  with patient and she has discussed with her husband as well who plans to get tested  ? ?Sepsis ([place]) ?- Febrile, tachycardia, leukocytosis.  Source due to gonococcal infection ?- see DGI ? ?Right shoulder pain ?- follow up xray ?- presumed disseminated infection  ? ?Left ankle pain ?- s/p 5 cc cloudy yellow fluid aspirated on 3/14 (WBC 823, 83% neuts). Given positive gonorrhea testing and symptoms, suspicion still high for infectious arthritis  ?- follow up joint culture (currently no organisms seen) ? ?Iron deficiency anemia ?- History of iron deficiency anemia due to uterine fibroids ?- Iron stores again low ?-She was given a dose of Feraheme on 11/02/2021.  Hold off on further iron given underlying infection ?- She is also getting 1 unit of PRBC on 11/03/2021 due to worsened hemoglobin down to 6.5 g/dL ? ?History of pulmonary embolism ?- continue eliquis for now; if needing surgery will need to be placed on hold  ? ? ? ?Old records reviewed in assessment of this patient ? ?Antimicrobials: ?Vanc 3/14 x 1 ?Cefepime 3/14 >> 3/15 ?Rocephin 3/15 >> current  ? ?DVT prophylaxis:  ? ?apixaban (ELIQUIS) tablet 5 mg  ? ?Code Status:   Code Status: Full Code ? ?Disposition Plan:  Home in 2-3 days ?Status is: Inpt ? ?Objective: ?Blood pressure 120/74, pulse 96, temperature 99.3 ?F (37.4 ?C), resp. rate 16, height '5\' 5"'$  (1.651 m), weight 89.7 kg, SpO2 100 %.  ?Examination:  ?Physical Exam ?Constitutional:   ?   General: She is not in acute distress. ?   Appearance: Normal appearance.  ?HENT:  ?  Head: Normocephalic and atraumatic.  ?   Mouth/Throat:  ?   Mouth: Mucous membranes are moist.  ?Eyes:  ?   Extraocular Movements: Extraocular movements intact.  ?Cardiovascular:  ?   Rate and Rhythm: Normal rate and regular rhythm.  ?   Comments: Subtle 2/6 HSM appreciated in LUSB ?Pulmonary:  ?   Effort: Pulmonary effort is normal.  ?   Breath sounds: Normal breath sounds.  ?Abdominal:  ?   General: Bowel sounds are normal. There is  no distension.  ?   Palpations: Abdomen is soft.  ?   Tenderness: There is no abdominal tenderness.  ?Musculoskeletal:     ?   General: Normal range of motion.  ?   Cervical back: Normal range of motion and neck supple.  ?Skin: ?   General: Skin is warm and dry.  ?   Findings: No lesion.  ?   Comments: No lesions appreciated on general skin exam including hands and legs  ?Neurological:  ?   General: No focal deficit present.  ?   Mental Status: She is alert.  ?Psychiatric:     ?   Mood and Affect: Mood normal.     ?   Behavior: Behavior normal.  ?  ? ?Consultants:  ?ID ?Rheum ?Orthopedic surgery  ? ?Procedures:  ?Left ankle arthrocentesis, 11/02/2021 ? ?Data Reviewed: ?Results for orders placed or performed during the hospital encounter of 11/02/21 (from the past 24 hour(s))  ?Vitamin B12     Status: None  ? Collection Time: 11/02/21  5:22 PM  ?Result Value Ref Range  ? Vitamin B-12 231 180 - 914 pg/mL  ?C-reactive protein     Status: Abnormal  ? Collection Time: 11/02/21  5:22 PM  ?Result Value Ref Range  ? CRP 19.6 (H) <1.0 mg/dL  ?HIV Antibody (routine testing w rflx)     Status: None  ? Collection Time: 11/02/21  5:22 PM  ?Result Value Ref Range  ? HIV Screen 4th Generation wRfx Non Reactive Non Reactive  ?Sedimentation rate     Status: Abnormal  ? Collection Time: 11/02/21  5:22 PM  ?Result Value Ref Range  ? Sed Rate 86 (H) 0 - 20 mm/hr  ?Uric acid     Status: None  ? Collection Time: 11/02/21  5:22 PM  ?Result Value Ref Range  ? Uric Acid, Serum 2.8 2.5 - 7.1 mg/dL  ?CBC     Status: Abnormal  ? Collection Time: 11/03/21  3:28 AM  ?Result Value Ref Range  ? WBC 13.0 (H) 4.0 - 10.5 K/uL  ? RBC 3.71 (L) 3.87 - 5.11 MIL/uL  ? Hemoglobin 6.5 (L) 12.0 - 15.0 g/dL  ? HCT 23.3 (L) 36.0 - 46.0 %  ? MCV 62.8 (L) 80.0 - 100.0 fL  ? MCH 17.5 (L) 26.0 - 34.0 pg  ? MCHC 27.9 (L) 30.0 - 36.0 g/dL  ? RDW 20.4 (H) 11.5 - 15.5 %  ? Platelets 436 (H) 150 - 400 K/uL  ? nRBC 0.0 0.0 - 0.2 %  ?Basic metabolic panel     Status:  Abnormal  ? Collection Time: 11/03/21  3:28 AM  ?Result Value Ref Range  ? Sodium 136 135 - 145 mmol/L  ? Potassium 4.1 3.5 - 5.1 mmol/L  ? Chloride 105 98 - 111 mmol/L  ? CO2 24 22 - 32 mmol/L  ? Glucose, Bld 90 70 - 99 mg/dL  ? BUN 8 6 - 20 mg/dL  ? Creatinine, Ser 0.83 0.44 -  1.00 mg/dL  ? Calcium 8.8 (L) 8.9 - 10.3 mg/dL  ? GFR, Estimated >60 >60 mL/min  ? Anion gap 7 5 - 15  ?Prepare RBC (crossmatch)     Status: None  ? Collection Time: 11/03/21  8:25 AM  ?Result Value Ref Range  ? Order Confirmation    ?  ORDER PROCESSED BY BLOOD BANK ?Performed at [facility], [address] [place], [place] [address] ?  ?Type and screen [facility]     Status: None (Preliminary result)  ? Collection Time: 11/03/21  9:08 AM  ?Result Value Ref Range  ? ABO/RH(D) A POS   ? Antibody Screen POS   ? Sample Expiration 11/06/2021,2359   ? Antibody Identification ANTI M   ? Unit Number [id number]   ? Blood Component Type RED CELLS,LR   ? Unit division 00   ? Status of Unit ALLOCATED   ? Transfusion Status OK TO TRANSFUSE   ? Crossmatch Result COMPATIBLE   ? Unit Number [id number]   ? Blood Component Type RED CELLS,LR   ? Unit division 00   ? Status of Unit ALLOCATED   ? Transfusion Status OK TO TRANSFUSE   ? Crossmatch Result COMPATIBLE   ? Unit Number [id number]   ? Blood Component Type RED CELLS,LR   ? Unit division 00   ? Status of Unit ALLOCATED   ? Transfusion Status OK TO TRANSFUSE   ? Crossmatch Result COMPATIBLE   ?  ?I have Reviewed nursing notes, Vitals, and Lab results since pt's last encounter. Pertinent lab results : see above ?I have ordered test including BMP, CBC, Mg ?I have reviewed the last note from staff over past 24 hours ?I have discussed pt's care plan and test results with nursing staff, case manager ? ? LOS: 1 day  ? ?Dwyane Dee, MD ?[facility] ?11/03/2021, 2:22 PM ? ?

## 2021-11-03 NOTE — Assessment & Plan Note (Addendum)
-   positive NG test on admission. Given the left ankle and right shoulder pain with presumed infected joint, consistent with disseminated infection ?- d/c'd vanc and cefepime ?- continue rocephin ?- echo normal and reassuring  ?- evaluated by rheum, ortho, and ID ?- L ankle pain seems improved she says but still has some  swelling and joint still TTP but slowly improving.  ?-Right shoulder MRI obtained which is negative for evidence of underlying infection ?-plan is for midline placement followed by ongoing Rocephin at discharge until outpatient follow-up with ID on 11/15/2021 ?

## 2021-11-03 NOTE — Assessment & Plan Note (Addendum)
-   per MRI: "Low-grade, partial width interstitial tear at the junction of the ?supraspinatus and infraspinatus tendons just before the footprint" ?- outpatient referral to PT ?

## 2021-11-03 NOTE — Assessment & Plan Note (Addendum)
-   s/p 5 cc cloudy yellow fluid aspirated on 3/14 (WBC 823, 83% neuts). Given positive gonorrhea testing and symptoms, suspicion still high for infectious arthritis  ?-Joint culture was negative but had been on antibiotics ?

## 2021-11-03 NOTE — Consult Note (Signed)
Reason for Consult: Inflammatory versus septic arthritis ? ?Referring Physician: Hospitalist ? ?Sharon Herring  ? 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  History of iron deficiency anemia ?Had fibroid removed surgically last summer.  Complicated by pulmonary embolism.  She has been anticoagulation ?6 days ago when she awakened she had some pain in the left ankle.  The following day she had pain in the right shoulder.  2 days later she had severe pain in the left ankle with a good deal of swelling.  Had fever.  Came to emergency room where she had a leukocytosis.  She had elevated sed rate at 86 CRP 19.  White count 14,000.  She had aspiration of the left ankle with 5 cc cloudy fluid.  800 white cells mainly polys ?Urine PCR positive for GC. ?She has not had rashes.  Right shoulder still bothers her left ankle is better since surgery but still swollen by her report ? ?PMH: Iron deficiency anemia.  Pulmonary embolism ? ?SURGICAL HISTORY:  fibroid tumor removal ? ?Family History: Negative for the connective tissue disease ? ?Social History: No significant cigarettes or alcohol ? ?Allergies:  ?Allergies  ?Allergen Reactions  ? Aspirin Nausea And Vomiting  ? Norethindrone Shortness Of Breath and Rash  ? Oxycodone-Acetaminophen Rash and Shortness Of Breath  ? Lexapro [Escitalopram] Palpitations  ? Tape Other (See Comments)  ?  Burning feeling. Has left mark on skin.  ? Belviq [Lorcaserin Hcl] Other (See Comments)  ?  headache  ? Dicyclomine Nausea And Vomiting  ? Contrave [Naltrexone-Bupropion Hcl Er] Anxiety  ?  Altered mental state  ? ? ?Medications: Continuous: ? cefTRIAXone (ROCEPHIN)  IV    ? ? ? ? ? ? ?ROS: No GI upset or shortness of breath.  No urinary discharge ? ? ?PHYSICAL EXAM: ?Blood pressure 120/74, pulse 96, temperature 99.3 ?F (37.4 ?C), resp. rate 16, height '5\' 5"'$  (1.651 m), weight 89.7 kg, SpO2 100 %. ?Pleasant female.  No acute distress.  No rashes.  Oropharynx clear.  Clear chest.   Systolic murmur.  No visceromegaly.  No significant edema ?Musculoskeletal: Painful arc of motion of right shoulder.  No palpable effusion left shoulder moves well.  Hands elbows MCPs PIPs without synovitis.  Hips move well.  Knees without synovitis.  Left ankle synovitis with effusion.  Achilles insertion is tender.  MTPs nontender.  Right foot and ankle without synovitis ? 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 ?Recommendations: ?Usual treatment for disseminated gonorrhea with joint involvement would be parenteral Rocephin gram daily.  May want to confer with ID regarding length of treatment ?Orthopedic opinion regarding whether ankle needs to be drained or not ?X-ray right shoulder.  If unimproved would consider other imaging ? ?Liliane Bade W ?11/03/2021, 1:01 PM  ? ? ?  ?

## 2021-11-04 ENCOUNTER — Encounter: Payer: Self-pay | Admitting: Hematology and Oncology

## 2021-11-04 ENCOUNTER — Other Ambulatory Visit (HOSPITAL_COMMUNITY): Payer: Self-pay

## 2021-11-04 DIAGNOSIS — M25572 Pain in left ankle and joints of left foot: Secondary | ICD-10-CM | POA: Diagnosis not present

## 2021-11-04 DIAGNOSIS — A5486 Gonococcal sepsis: Secondary | ICD-10-CM | POA: Diagnosis not present

## 2021-11-04 LAB — CBC WITH DIFFERENTIAL/PLATELET
Abs Immature Granulocytes: 0.18 10*3/uL — ABNORMAL HIGH (ref 0.00–0.07)
Basophils Absolute: 0.1 10*3/uL (ref 0.0–0.1)
Basophils Relative: 1 %
Eosinophils Absolute: 0 10*3/uL (ref 0.0–0.5)
Eosinophils Relative: 0 %
HCT: 22.2 % — ABNORMAL LOW (ref 36.0–46.0)
Hemoglobin: 7.3 g/dL — ABNORMAL LOW (ref 12.0–15.0)
Immature Granulocytes: 2 %
Lymphocytes Relative: 23 %
Lymphs Abs: 2.7 10*3/uL (ref 0.7–4.0)
MCH: 20.4 pg — ABNORMAL LOW (ref 26.0–34.0)
MCHC: 32.9 g/dL (ref 30.0–36.0)
MCV: 62.2 fL — ABNORMAL LOW (ref 80.0–100.0)
Monocytes Absolute: 1.3 10*3/uL — ABNORMAL HIGH (ref 0.1–1.0)
Monocytes Relative: 11 %
Neutro Abs: 7.4 10*3/uL (ref 1.7–7.7)
Neutrophils Relative %: 63 %
Platelets: 392 10*3/uL (ref 150–400)
RBC: 3.57 MIL/uL — ABNORMAL LOW (ref 3.87–5.11)
RDW: 20.7 % — ABNORMAL HIGH (ref 11.5–15.5)
Smear Review: NORMAL
WBC: 11.6 10*3/uL — ABNORMAL HIGH (ref 4.0–10.5)
nRBC: 0 % (ref 0.0–0.2)

## 2021-11-04 LAB — HEPATITIS A ANTIBODY, TOTAL: hep A Total Ab: REACTIVE — AB

## 2021-11-04 LAB — RHEUMATOID FACTOR: Rheumatoid fact SerPl-aCnc: 24 IU/mL — ABNORMAL HIGH (ref <14.0)

## 2021-11-04 LAB — BASIC METABOLIC PANEL
Anion gap: 7 (ref 5–15)
BUN: 6 mg/dL (ref 6–20)
CO2: 27 mmol/L (ref 22–32)
Calcium: 8.8 mg/dL — ABNORMAL LOW (ref 8.9–10.3)
Chloride: 102 mmol/L (ref 98–111)
Creatinine, Ser: 0.66 mg/dL (ref 0.44–1.00)
GFR, Estimated: >60 mL/min (ref >60)
Glucose, Bld: 115 mg/dL — ABNORMAL HIGH (ref 70–99)
Potassium: 3.9 mmol/L (ref 3.5–5.1)
Sodium: 136 mmol/L (ref 135–145)

## 2021-11-04 LAB — MISC LABCORP TEST (SEND OUT): Labcorp test code: 19497

## 2021-11-04 LAB — HEPATITIS B SURFACE ANTIGEN: Hepatitis B Surface Ag: NONREACTIVE

## 2021-11-04 LAB — RPR: RPR Ser Ql: NONREACTIVE

## 2021-11-04 LAB — HIV-1 RNA QUANT-NO REFLEX-BLD
HIV 1 RNA Quant: <20 copies/mL
LOG10 HIV-1 RNA: UNDETERMINED log10copy/mL

## 2021-11-04 LAB — MAGNESIUM: Magnesium: 2.1 mg/dL (ref 1.7–2.4)

## 2021-11-04 LAB — HEPATITIS B SURFACE ANTIBODY, QUANTITATIVE: Hep B S AB Quant (Post): 50.8 m[IU]/mL (ref >9.9)

## 2021-11-04 NOTE — TOC Progression Note (Signed)
Transition of Care (TOC) - Progression Note  ? ? ?Patient Details  ?Name: Sharon Herring ?MRN: [id number] ?Date of Birth: 02-24-1976 ? ?Transition of Care (TOC) CM/SW Contact  ?Conception Oms, RN ?Phone Number: ?11/04/2021, 9:01 AM ? 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 ? ? ?Expected Discharge Plan: [place] ?Barriers to Discharge: Continued Medical Work up ? ?Expected Discharge Plan and Services ?Expected Discharge Plan: [place] ?  ?Discharge Planning Services: CM Consult, Medication Assistance ?  ?Living arrangements for the past 2 months: [place] ?                ?  ?  ?  ?  ?  ?  ?[place] Agency:  Merchant navy officer) ?  ?  ?  ? ? ?Social Determinants of Health (SDOH) Interventions ?  ? ?Readmission Risk Interventions ?No flowsheet data found. ? ?

## 2021-11-04 NOTE — Progress Notes (Signed)
Patient given one unit of PRBCs per order. Tylenol and benadryl given prior to transfusion. VSS, patient afebrile, and no evidence of transfusion reaction after completion.  ?

## 2021-11-04 NOTE — Progress Notes (Signed)
? ? ? ? ? ?  Date: 11/04/2021 ? ?Patient name: Clarisse Rodriges [place]  ?Medical record number: [id number]  ?Date of birth: 1976/01/20  ? ?Patient case was reviewed with Dollene Cleveland, Pharm D. ? ?Updated CDC guidelines re treatment of disseminated gonococcal arthritis no longer recommend switch to cefixime absent susceptibility data showing that the organism is sensitive to it. ? ?I am skeptical that gonococcus will grow from her synovial fluid and is not growing from her blood cultures. ? ?We will be unlikely to have cefixime sensitivity. ? ?Her joint fluid did not have a terribly high white blood cell count so I hope that this falls less into a purulent polyarticular septic arthritis from DGI ? ?The latter can indeed need surgery at times though I do not think that this patient will require it. ? ?I think she is going to need at least 7 days of ceftriaxone.  If she does not improve sufficiently she will need a longer course and we will need to revisit the idea of at least a midline to complete therapy ? ?She would benefit from HIV preexposure prophylaxis ? ?We will check in on her again tomorrow. ? ? ?Rhina Brackett Dam ?11/04/2021, 4:23 PM ? ? ?

## 2021-11-04 NOTE — Progress Notes (Signed)
?Progress Note ? ? ? ?Sharon Herring   ?PRF:[id number]  ?DOB: 11/19/1975  ?DOA: 11/02/2021     2 ?PCP: Steele Sizer, MD ? ?Initial CC: left ankle pain and right shoulder pain ? 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  She endorsed that symptoms have been going on for approximately 1 week prior to admission.  She also endorsed that she was treated for a possible yeast infection approximately 2 weeks ago with a dose of fluconazole outpatient.  She had reported that her left ankle pain was much worse compared to the right shoulder. ?She underwent further work-up on admission including left ankle arthrocentesis which yielded 5 cc cloudy yellow fluid.  Case was also discussed with rheumatology and she was recommended to undergo testing for GC/CHL which was positive for gonococcus.  She had initially been started on vancomycin and cefepime.  After positive gonococcus test, she was transitioned to Rocephin.  ID was also consulted for further input given disseminated gonococcal infection. ? ?Interval History:  ?No events overnight.  She feels that her fevers are slowly improving.  She informed her husband yesterday and he has since been tested and treated. ?States that her left ankle feels slightly better in terms of pain and no worsening of her right shoulder nor any new joint pains elsewhere. ? ?Assessment and Plan: ?* DGI (disseminated gonococcal infection) ([place]) ?- positive NG test on admission. Given the left ankle and right shoulder pain with presumed infected joint, consistent with disseminated infection ?- d/c vanc and cefepime ?- continue rocephin ?- echo normal and reassuring  ?- evaluated by rheum, ortho, and ID ?- L ankle pain seems improved she says but still has some moderate amount of swelling and joint still TTP. R shoulder seems improved. Continue ROM exercises as per  ortho ?- possible need for midline per ID to complete minimum 7 days Rocephin ? ?Right shoulder pain ?- No acute findings on x-ray ?- presumed disseminated infection  ?-Continue to monitor for any further worsening or improvement ? ?Left ankle pain ?- s/p 5 cc cloudy yellow fluid aspirated on 3/14 (WBC 823, 83% neuts). Given positive gonorrhea testing and symptoms, suspicion still high for infectious arthritis  ?- follow up joint culture (currently no organisms seen) ? ?Sepsis (HCC)-resolved as of 11/04/2021 ?- Febrile, tachycardia, leukocytosis.  Source due to gonococcal infection ?- see DGI ? ?Iron deficiency anemia ?- History of iron deficiency anemia due to uterine fibroids ?- Iron stores again low ?-She was given a dose of Feraheme on 11/02/2021.  Hold off on further iron given underlying infection ?- She is also getting 1 unit of PRBC on 11/03/2021 due to worsened hemoglobin down to 6.5 g/dL ? ?History of pulmonary embolism ?- Eliquis on hold for now in case of need for any debridement ? ? ? ?Old records reviewed in assessment of this patient ? ?Antimicrobials: ?Vanc 3/14 x 1 ?Cefepime 3/14 >> 3/15 ?Rocephin 3/15 >> current  ? ?DVT prophylaxis:  ? ? ? ?Code Status:   Code Status: Full Code ? ?Disposition Plan:  Home in 2-3 days ?Status is: Inpt ? ?Objective: ?Blood pressure 125/76, pulse 86, temperature (!) 100.4 ?F (38 ?C), resp. rate 18, height '5\' 5"'$  (1.651 m), weight 89.7 kg, SpO2 99 %.  ?Examination:  ?Physical Exam ?Constitutional:   ?   General: She is not in acute distress. ?  Appearance: Normal appearance.  ?HENT:  ?   Head: Normocephalic and atraumatic.  ?   Mouth/Throat:  ?   Mouth: Mucous membranes are moist.  ?Eyes:  ?   Extraocular Movements: Extraocular movements intact.  ?Cardiovascular:  ?   Rate and Rhythm: Normal rate and regular rhythm.  ?   Comments: Subtle 2/6 HSM appreciated in LUSB ?Pulmonary:  ?   Effort: Pulmonary effort is normal.  ?   Breath sounds: Normal breath sounds.  ?Abdominal:   ?   General: Bowel sounds are normal. There is no distension.  ?   Palpations: Abdomen is soft.  ?   Tenderness: There is no abdominal tenderness.  ?Musculoskeletal:     ?   General: Normal range of motion.  ?   Cervical back: Normal range of motion and neck supple.  ?Skin: ?   General: Skin is warm and dry.  ?   Findings: No lesion.  ?   Comments: No lesions appreciated on general skin exam including hands and legs  ?Neurological:  ?   General: No focal deficit present.  ?   Mental Status: She is alert.  ?Psychiatric:     ?   Mood and Affect: Mood normal.     ?   Behavior: Behavior normal.  ?  ? ?Consultants:  ?ID ?Rheum ?Orthopedic surgery  ? ?Procedures:  ?Left ankle arthrocentesis, 11/02/2021 ? ?Data Reviewed: ?Results for orders placed or performed during the hospital encounter of 11/02/21 (from the past 24 hour(s))  ?Hepatitis B surface antigen     Status: None  ? Collection Time: 11/04/21  6:04 AM  ?Result Value Ref Range  ? Hepatitis B Surface Ag NON REACTIVE NON REACTIVE  ?Basic metabolic panel     Status: Abnormal  ? Collection Time: 11/04/21  6:04 AM  ?Result Value Ref Range  ? Sodium 136 135 - 145 mmol/L  ? Potassium 3.9 3.5 - 5.1 mmol/L  ? Chloride 102 98 - 111 mmol/L  ? CO2 27 22 - 32 mmol/L  ? Glucose, Bld 115 (H) 70 - 99 mg/dL  ? BUN 6 6 - 20 mg/dL  ? Creatinine, Ser 0.66 0.44 - 1.00 mg/dL  ? Calcium 8.8 (L) 8.9 - 10.3 mg/dL  ? GFR, Estimated >60 >60 mL/min  ? Anion gap 7 5 - 15  ?CBC with Differential/Platelet     Status: Abnormal  ? Collection Time: 11/04/21  6:04 AM  ?Result Value Ref Range  ? WBC 11.6 (H) 4.0 - 10.5 K/uL  ? RBC 3.57 (L) 3.87 - 5.11 MIL/uL  ? Hemoglobin 7.3 (L) 12.0 - 15.0 g/dL  ? HCT 22.2 (L) 36.0 - 46.0 %  ? MCV 62.2 (L) 80.0 - 100.0 fL  ? MCH 20.4 (L) 26.0 - 34.0 pg  ? MCHC 32.9 30.0 - 36.0 g/dL  ? RDW 20.7 (H) 11.5 - 15.5 %  ? Platelets 392 150 - 400 K/uL  ? nRBC 0.0 0.0 - 0.2 %  ? Neutrophils Relative % 63 %  ? Neutro Abs 7.4 1.7 - 7.7 K/uL  ? Lymphocytes Relative 23 %  ?  Lymphs Abs 2.7 0.7 - 4.0 K/uL  ? Monocytes Relative 11 %  ? Monocytes Absolute 1.3 (H) 0.1 - 1.0 K/uL  ? Eosinophils Relative 0 %  ? Eosinophils Absolute 0.0 0.0 - 0.5 K/uL  ? Basophils Relative 1 %  ? Basophils Absolute 0.1 0.0 - 0.1 K/uL  ? WBC Morphology MORPHOLOGY UNREMARKABLE   ? RBC Morphology MIXED RBC  MORPHOLOGY PRESENT   ? Smear Review Normal platelet morphology   ? Immature Granulocytes 2 %  ? Abs Immature Granulocytes 0.18 (H) 0.00 - 0.07 K/uL  ?Magnesium     Status: None  ? Collection Time: 11/04/21  6:04 AM  ?Result Value Ref Range  ? Magnesium 2.1 1.7 - 2.4 mg/dL  ?  ?I have Reviewed nursing notes, Vitals, and Lab results since pt's last encounter. Pertinent lab results : see above ?I have ordered test including BMP, CBC, Mg ?I have reviewed the last note from staff over past 24 hours ?I have discussed pt's care plan and test results with nursing staff, case manager ? ? LOS: 2 days  ? ?Dwyane Dee, MD ?[facility] ?11/04/2021, 6:02 PM ? ?

## 2021-11-04 NOTE — Progress Notes (Signed)
?Subjective: ? ?Patient reports that she was able to get out of bed and walk today.  She states that the ankle and shoulder pain is slowly improving, however she continues to have swelling in the ankle and had some mild discomfort with weightbearing. ? ? ?Objective:  ? ?VITALS:   ?Vitals:  ? 11/04/21 0615 11/04/21 0835 11/04/21 1137 11/04/21 1554  ?BP:  132/86 135/87 125/76  ?Pulse:  86 87 86  ?Resp:  '18 18 18  '$ ?Temp: 98.4 ?F (36.9 ?C) 98.6 ?F (37 ?C) 99.6 ?F (37.6 ?C) (!) 100.4 ?F (38 ?C)  ?TempSrc: Oral     ?SpO2:  100% 100% 99%  ?Weight:      ?Height:      ? ? ?PHYSICAL EXAM: ?General: Alert, comfortable resting in bed ? ?Right shoulder: Mild tenderness in bicipital groove and anterolateral shoulder, tolerates full passive internal and external rotation with the elbow at her side, discomfort with forward flexion above 70 degrees ? ?Left ankle: Moderate circumferential swelling, no significant tenderness medially or laterally, tolerates passive ankle dorsiflexion/plantarflexion without discomfort ? ?LABS ? ?Results for orders placed or performed during the hospital encounter of 11/02/21 (from the past 24 hour(s))  ?Hepatitis B surface antigen     Status: None  ? Collection Time: 11/04/21  6:04 AM  ?Result Value Ref Range  ? Hepatitis B Surface Ag NON REACTIVE NON REACTIVE  ?Basic metabolic panel     Status: Abnormal  ? Collection Time: 11/04/21  6:04 AM  ?Result Value Ref Range  ? Sodium 136 135 - 145 mmol/L  ? Potassium 3.9 3.5 - 5.1 mmol/L  ? Chloride 102 98 - 111 mmol/L  ? CO2 27 22 - 32 mmol/L  ? Glucose, Bld 115 (H) 70 - 99 mg/dL  ? BUN 6 6 - 20 mg/dL  ? Creatinine, Ser 0.66 0.44 - 1.00 mg/dL  ? Calcium 8.8 (L) 8.9 - 10.3 mg/dL  ? GFR, Estimated >60 >60 mL/min  ? Anion gap 7 5 - 15  ?CBC with Differential/Platelet     Status: Abnormal  ? Collection Time: 11/04/21  6:04 AM  ?Result Value Ref Range  ? WBC 11.6 (H) 4.0 - 10.5 K/uL  ? RBC 3.57 (L) 3.87 - 5.11 MIL/uL  ? Hemoglobin 7.3 (L) 12.0 - 15.0 g/dL  ? HCT  22.2 (L) 36.0 - 46.0 %  ? MCV 62.2 (L) 80.0 - 100.0 fL  ? MCH 20.4 (L) 26.0 - 34.0 pg  ? MCHC 32.9 30.0 - 36.0 g/dL  ? RDW 20.7 (H) 11.5 - 15.5 %  ? Platelets 392 150 - 400 K/uL  ? nRBC 0.0 0.0 - 0.2 %  ? Neutrophils Relative % 63 %  ? Neutro Abs 7.4 1.7 - 7.7 K/uL  ? Lymphocytes Relative 23 %  ? Lymphs Abs 2.7 0.7 - 4.0 K/uL  ? Monocytes Relative 11 %  ? Monocytes Absolute 1.3 (H) 0.1 - 1.0 K/uL  ? Eosinophils Relative 0 %  ? Eosinophils Absolute 0.0 0.0 - 0.5 K/uL  ? Basophils Relative 1 %  ? Basophils Absolute 0.1 0.0 - 0.1 K/uL  ? WBC Morphology MORPHOLOGY UNREMARKABLE   ? RBC Morphology MIXED RBC MORPHOLOGY PRESENT   ? Smear Review Normal platelet morphology   ? Immature Granulocytes 2 %  ? Abs Immature Granulocytes 0.18 (H) 0.00 - 0.07 K/uL  ?Magnesium     Status: None  ? Collection Time: 11/04/21  6:04 AM  ?Result Value Ref Range  ? Magnesium 2.1 1.7 -  2.4 mg/dL  ? ? ?DG Shoulder Right ? ?Result Date: 11/03/2021 ?CLINICAL DATA:  Right shoulder pain for several days EXAM: RIGHT SHOULDER - 2+ VIEW COMPARISON:  Chest radiograph 11/02/2021 FINDINGS: There is no evidence of fracture or dislocation. There is no evidence of arthropathy or other focal bone abnormality. Soft tissues are unremarkable. IMPRESSION: 1. No significant abnormality identified. If shoulder pain proves refractory to conservative therapy, MRI may be warranted for further characterization. Electronically Signed   By: Van Clines M.D.   On: 11/03/2021 15:06  ? ?ECHOCARDIOGRAM COMPLETE ? ?Result Date: 11/03/2021 ?   ECHOCARDIOGRAM REPORT   Patient Name:   Sharon Herring [place] Date of Exam: 11/03/2021 Medical Rec #:  [id number]            Height:       65.0 in Accession #:    [id number]           Weight:       197.8 lb Date of Birth:  [birth year]            BSA:          1.969 m? Patient Age:    46 years             BP:           120/74 mmHg Patient Gender: F                    HR:           96 bpm. Exam Location:  [facility] Procedure: 2D Echo, Cardiac  Doppler and Color Doppler Indications:     Murmur R01.1  History:         Patient has prior history of Echocardiogram examinations, most                  recent 02/20/2021. Dizziness, acute pulmonary embolism.  Sonographer:     Sherrie Sport Referring Phys:  [place] Diagnosing Phys: Kate Sable MD IMPRESSIONS  1. Left ventricular ejection fraction, by estimation, is 60 to 65%. The left ventricle has normal function. The left ventricle has no regional wall motion abnormalities. Left ventricular diastolic parameters were normal.  2. Right ventricular systolic function is normal. The right ventricular size is normal.  3. The mitral valve is normal in structure. No evidence of mitral valve regurgitation.  4. The aortic valve is tricuspid. Aortic valve regurgitation is not visualized. FINDINGS  Left Ventricle: Left ventricular ejection fraction, by estimation, is 60 to 65%. The left ventricle has normal function. The left ventricle has no regional wall motion abnormalities. The left ventricular internal cavity size was normal in size. There is  no left ventricular hypertrophy. Left ventricular diastolic parameters were normal. Right Ventricle: The right ventricular size is normal. No increase in right ventricular wall thickness. Right ventricular systolic function is normal. Left Atrium: Left atrial size was normal in size. Right Atrium: Right atrial size was normal in size. Pericardium: There is no evidence of pericardial effusion. Mitral Valve: The mitral valve is normal in structure. No evidence of mitral valve regurgitation. MV peak gradient, 6.4 mmHg. The mean mitral valve gradient is 3.0 mmHg. Tricuspid Valve: The tricuspid valve is normal in structure. Tricuspid valve regurgitation is not demonstrated. Aortic Valve: The aortic valve is tricuspid. Aortic valve regurgitation is not visualized. Aortic valve mean gradient measures 5.0 mmHg. Aortic valve peak gradient measures 8.0 mmHg. Aortic valve area,  by VTI measures 2.82 cm?. Pulmonic Valve: The pulmonic  valve was normal in structure. Pulmonic valve regurgitation is trivial. Aorta: The aortic root is normal in size and structure. Venous: The inferior vena cava was not well visualized. IAS/Shunts: No atrial level shunt detected by color flow Doppler.  LEFT VENTRICLE PLAX 2D LVIDd:         5.50 cm   Diastology LVIDs:         3.00 cm   LV e' medial:    9.57 cm/s LV PW:         1.00 cm   LV E/e' medial:  11.8 LV IVS:        0.65 cm   LV e' lateral:   11.30 cm/s LVOT diam:     2.10 cm   LV E/e' lateral: 10.0 LV SV:         60 LV SV Index:   31 LVOT Area:     3.46 cm?  RIGHT VENTRICLE RV Basal diam:  3.30 cm RV S prime:     14.50 cm/s TAPSE (M-mode): 3.3 cm LEFT ATRIUM             Index        RIGHT ATRIUM           Index LA diam:        3.90 cm 1.98 cm/m?   RA Area:     16.70 cm? LA Vol (A2C):   69.5 ml 35.30 ml/m?  RA Volume:   46.90 ml  23.82 ml/m? LA Vol (A4C):   42.8 ml 21.74 ml/m? LA Biplane Vol: 56.8 ml 28.85 ml/m?  AORTIC VALVE                     PULMONIC VALVE AV Area (Vmax):    2.31 cm?      PV Vmax:        1.02 m/s AV Area (Vmean):   2.21 cm?      PV Vmean:       65.800 cm/s AV Area (VTI):     2.82 cm?      PV VTI:         0.187 m AV Vmax:           141.00 cm/s   PV Peak grad:   4.2 mmHg AV Vmean:          101.000 cm/s  PV Mean grad:   2.0 mmHg AV VTI:            0.214 m       RVOT Peak grad: 6 mmHg AV Peak Grad:      8.0 mmHg AV Mean Grad:      5.0 mmHg LVOT Vmax:         94.00 cm/s LVOT Vmean:        64.500 cm/s LVOT VTI:          0.174 m LVOT/AV VTI ratio: 0.81  AORTA Ao Root diam: 3.23 cm MITRAL VALVE                TRICUSPID VALVE MV Area (PHT): 3.83 cm?     TR Peak grad:   9.6 mmHg MV Area VTI:   3.03 cm?     TR Vmax:        155.00 cm/s MV Peak grad:  6.4 mmHg MV Mean grad:  3.0 mmHg     SHUNTS MV Vmax:       1.26 m/s     Systemic VTI:  0.17 m MV  Vmean:      86.8 cm/s    Systemic Diam: 2.10 cm MV Decel Time: 198 msec     Pulmonic VTI:  0.197 m MV E  velocity: 113.00 cm/s MV A velocity: 91.30 cm/s MV E/A ratio:  1.24 Kate Sable MD Electronically signed by Kate Sable MD Signature Date/Time: 11/03/2021/4:29:51 PM    Final    ? ?Assessmen

## 2021-11-04 NOTE — TOC Benefit Eligibility Note (Signed)
Patient Advocate Encounter ? ?Insurance verification completed.   ? ?The patient is currently admitted and upon discharge could be taking cefixime (Suprax) 400 mg capsule. ? ?The current 30 day co-pay is, $263.49.  ? ?The patient is insured through [employer]  ? ? ? ?Lyndel Safe, CPhT ?Pharmacy Patient Advocate Specialist ?[place] Patient Advocate Team ?Direct Number: [phone]  Fax: [phone] ? ? ? ? ? ?  ?

## 2021-11-04 NOTE — Plan of Care (Signed)

## 2021-11-05 DIAGNOSIS — R509 Fever, unspecified: Secondary | ICD-10-CM | POA: Diagnosis not present

## 2021-11-05 DIAGNOSIS — A5486 Gonococcal sepsis: Secondary | ICD-10-CM | POA: Diagnosis not present

## 2021-11-05 DIAGNOSIS — M25511 Pain in right shoulder: Secondary | ICD-10-CM | POA: Diagnosis not present

## 2021-11-05 DIAGNOSIS — D500 Iron deficiency anemia secondary to blood loss (chronic): Secondary | ICD-10-CM | POA: Diagnosis not present

## 2021-11-05 DIAGNOSIS — M25572 Pain in left ankle and joints of left foot: Secondary | ICD-10-CM | POA: Diagnosis not present

## 2021-11-05 LAB — CBC WITH DIFFERENTIAL/PLATELET
Abs Immature Granulocytes: 0.2 10*3/uL — ABNORMAL HIGH (ref 0.00–0.07)
Basophils Absolute: 0.1 10*3/uL (ref 0.0–0.1)
Basophils Relative: 1 %
Eosinophils Absolute: 0.1 10*3/uL (ref 0.0–0.5)
Eosinophils Relative: 1 %
HCT: 23.9 % — ABNORMAL LOW (ref 36.0–46.0)
Hemoglobin: 7.6 g/dL — ABNORMAL LOW (ref 12.0–15.0)
Immature Granulocytes: 2 %
Lymphocytes Relative: 22 %
Lymphs Abs: 2.4 10*3/uL (ref 0.7–4.0)
MCH: 19.9 pg — ABNORMAL LOW (ref 26.0–34.0)
MCHC: 31.8 g/dL (ref 30.0–36.0)
MCV: 62.7 fL — ABNORMAL LOW (ref 80.0–100.0)
Monocytes Absolute: 1.1 10*3/uL — ABNORMAL HIGH (ref 0.1–1.0)
Monocytes Relative: 10 %
Neutro Abs: 7.1 10*3/uL (ref 1.7–7.7)
Neutrophils Relative %: 64 %
Platelets: 395 10*3/uL (ref 150–400)
RBC: 3.81 MIL/uL — ABNORMAL LOW (ref 3.87–5.11)
RDW: 21.2 % — ABNORMAL HIGH (ref 11.5–15.5)
Smear Review: NORMAL
WBC: 10.9 10*3/uL — ABNORMAL HIGH (ref 4.0–10.5)
nRBC: 0.3 % — ABNORMAL HIGH (ref 0.0–0.2)

## 2021-11-05 LAB — BASIC METABOLIC PANEL
Anion gap: 9 (ref 5–15)
BUN: 6 mg/dL (ref 6–20)
CO2: 29 mmol/L (ref 22–32)
Calcium: 9.3 mg/dL (ref 8.9–10.3)
Chloride: 102 mmol/L (ref 98–111)
Creatinine, Ser: 0.62 mg/dL (ref 0.44–1.00)
GFR, Estimated: >60 mL/min (ref >60)
Glucose, Bld: 107 mg/dL — ABNORMAL HIGH (ref 70–99)
Potassium: 4.3 mmol/L (ref 3.5–5.1)
Sodium: 140 mmol/L (ref 135–145)

## 2021-11-05 LAB — MAGNESIUM: Magnesium: 2.3 mg/dL (ref 1.7–2.4)

## 2021-11-05 MED ORDER — SODIUM CHLORIDE 0.9 % IV SOLN
1.0000 g | INTRAVENOUS | Status: DC
  Administered 2021-11-06 – 2021-11-08 (×3): 1 g via INTRAVENOUS
  Filled 2021-11-05: qty 10
  Filled 2021-11-05 (×2): qty 1

## 2021-11-05 NOTE — Progress Notes (Signed)
?Progress Note ? ? ? ?Sharon Herring   ?KDT:[id number]  ?DOB: 1975/09/26  ?DOA: 11/02/2021     3 ?PCP: Sharon Sizer, MD ? ?Initial CC: left ankle pain and right shoulder pain ? 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  She endorsed that symptoms have been going on for approximately 1 week prior to admission.  She also endorsed that she was treated for a possible yeast infection approximately 2 weeks ago with a dose of fluconazole outpatient.  She had reported that her left ankle pain was much worse compared to the right shoulder. ?She underwent further work-up on admission including left ankle arthrocentesis which yielded 5 cc cloudy yellow fluid.  Case was also discussed with rheumatology and she was recommended to undergo testing for GC/CHL which was positive for gonococcus.  She had initially been started on vancomycin and cefepime.  After positive gonococcus test, she was transitioned to Rocephin.  ID was also consulted for further input given disseminated gonococcal infection. ? ?Interval History:  ?She stated she fell going to the bathroom overnight and hit her right wrist. It was feeling better by this morning. No injuries appreciated on evaluation this morning.  ? ?Assessment and Plan: ?* DGI (disseminated gonococcal infection) ([place]) ?- positive NG test on admission. Given the left ankle and right shoulder pain with presumed infected joint, consistent with disseminated infection ?- d/c vanc and cefepime ?- continue rocephin ?- echo normal and reassuring  ?- evaluated by rheum, ortho, and ID ?- L ankle pain seems improved she says but still has some moderate amount of swelling and joint still TTP. R shoulder seems improved. Continue ROM exercises as per ortho ?- possible need for midline per ID to complete minimum 7 days Rocephin ? ?Right shoulder pain ?- No acute findings  on x-ray ?- presumed disseminated infection  ?-Continue to monitor for any further worsening or improvement ? ?Left ankle pain ?- s/p 5 cc cloudy yellow fluid aspirated on 3/14 (WBC 823, 83% neuts). Given positive gonorrhea testing and symptoms, suspicion still high for infectious arthritis  ?- follow up joint culture (currently no organisms seen) ? ?Sepsis (HCC)-resolved as of 11/04/2021 ?- Febrile, tachycardia, leukocytosis.  Source due to gonococcal infection ?- see DGI ? ?Iron deficiency anemia ?- History of iron deficiency anemia due to uterine fibroids ?- Iron stores again low ?-She was given a dose of Feraheme on 11/02/2021.  Hold off on further iron given underlying infection ?- She is also getting 1 unit of PRBC on 11/03/2021 due to worsened hemoglobin down to 6.5 g/dL ? ?History of pulmonary embolism ?- Eliquis on hold for now in case of need for any debridement ? ? ? ?Old records reviewed in assessment of this patient ? ?Antimicrobials: ?Vanc 3/14 x 1 ?Cefepime 3/14 >> 3/15 ?Rocephin 3/15 >> current  ? ?DVT prophylaxis:  ? ? ? ?Code Status:   Code Status: Full Code ? ?Disposition Plan:  Home possibly by Saturday or Sun ?Status is: Inpt ? ?Objective: ?Blood pressure 125/80, pulse 82, temperature 98.5 ?F (36.9 ?C), temperature source Oral, resp. rate 18, height '5\' 5"'$  (1.651 m), weight 89.7 kg, SpO2 100 %.  ?Examination:  ?Physical Exam ?Constitutional:   ?   General: She is not in acute distress. ?   Appearance: Normal appearance.  ?HENT:  ?   Head: Normocephalic and atraumatic.  ?  Mouth/Throat:  ?   Mouth: Mucous membranes are moist.  ?Eyes:  ?   Extraocular Movements: Extraocular movements intact.  ?Cardiovascular:  ?   Rate and Rhythm: Normal rate and regular rhythm.  ?   Comments: Subtle 2/6 HSM appreciated in LUSB ?Pulmonary:  ?   Effort: Pulmonary effort is normal.  ?   Breath sounds: Normal breath sounds.  ?Abdominal:  ?   General: Bowel sounds are normal. There is no distension.  ?   Palpations:  Abdomen is soft.  ?   Tenderness: There is no abdominal tenderness.  ?Musculoskeletal:     ?   General: Normal range of motion.  ?   Cervical back: Normal range of motion and neck supple.  ?   Comments: Mild swelling of left ankle  ?Skin: ?   General: Skin is warm and dry.  ?   Findings: No lesion.  ?   Comments: No lesions appreciated on general skin exam including hands and legs  ?Neurological:  ?   General: No focal deficit present.  ?   Mental Status: She is alert.  ?Psychiatric:     ?   Mood and Affect: Mood normal.     ?   Behavior: Behavior normal.  ?  ? ?Consultants:  ?ID ?Rheum ?Orthopedic surgery  ? ?Procedures:  ?Left ankle arthrocentesis, 11/02/2021 ? ?Data Reviewed: ?Results for orders placed or performed during the hospital encounter of 11/02/21 (from the past 24 hour(s))  ?Basic metabolic panel     Status: Abnormal  ? Collection Time: 11/05/21  3:56 AM  ?Result Value Ref Range  ? Sodium 140 135 - 145 mmol/L  ? Potassium 4.3 3.5 - 5.1 mmol/L  ? Chloride 102 98 - 111 mmol/L  ? CO2 29 22 - 32 mmol/L  ? Glucose, Bld 107 (H) 70 - 99 mg/dL  ? BUN 6 6 - 20 mg/dL  ? Creatinine, Ser 0.62 0.44 - 1.00 mg/dL  ? Calcium 9.3 8.9 - 10.3 mg/dL  ? GFR, Estimated >60 >60 mL/min  ? Anion gap 9 5 - 15  ?CBC with Differential/Platelet     Status: Abnormal  ? Collection Time: 11/05/21  3:56 AM  ?Result Value Ref Range  ? WBC 10.9 (H) 4.0 - 10.5 K/uL  ? RBC 3.81 (L) 3.87 - 5.11 MIL/uL  ? Hemoglobin 7.6 (L) 12.0 - 15.0 g/dL  ? HCT 23.9 (L) 36.0 - 46.0 %  ? MCV 62.7 (L) 80.0 - 100.0 fL  ? MCH 19.9 (L) 26.0 - 34.0 pg  ? MCHC 31.8 30.0 - 36.0 g/dL  ? RDW 21.2 (H) 11.5 - 15.5 %  ? Platelets 395 150 - 400 K/uL  ? nRBC 0.3 (H) 0.0 - 0.2 %  ? Neutrophils Relative % 64 %  ? Neutro Abs 7.1 1.7 - 7.7 K/uL  ? Lymphocytes Relative 22 %  ? Lymphs Abs 2.4 0.7 - 4.0 K/uL  ? Monocytes Relative 10 %  ? Monocytes Absolute 1.1 (H) 0.1 - 1.0 K/uL  ? Eosinophils Relative 1 %  ? Eosinophils Absolute 0.1 0.0 - 0.5 K/uL  ? Basophils Relative 1 %  ?  Basophils Absolute 0.1 0.0 - 0.1 K/uL  ? WBC Morphology MORPHOLOGY UNREMARKABLE   ? Smear Review Normal platelet morphology   ? Immature Granulocytes 2 %  ? Abs Immature Granulocytes 0.20 (H) 0.00 - 0.07 K/uL  ? Dimorphism PRESENT   ? Polychromasia PRESENT   ? Target Cells PRESENT   ?Magnesium  Status: None  ? Collection Time: 11/05/21  3:56 AM  ?Result Value Ref Range  ? Magnesium 2.3 1.7 - 2.4 mg/dL  ?  ?I have Reviewed nursing notes, Vitals, and Lab results since pt's last encounter. Pertinent lab results : see above ?I have ordered test including BMP, CBC, Mg ?I have reviewed the last note from staff over past 24 hours ?I have discussed pt's care plan and test results with nursing staff, case manager ? ? LOS: 3 days  ? ?Dwyane Dee, MD ?[facility] ?11/05/2021, 1:38 PM ? ?

## 2021-11-05 NOTE — TOC Progression Note (Addendum)
Transition of Care (TOC) - Progression Note  ? ? ?Patient Details  ?Name: Sharon Herring ?MRN: [id number] ?Date of Birth: [birth year] ? ?Transition of Care (TOC) CM/SW Contact  ?Conception Oms, RN ?Phone Number: ?11/05/2021, 9:14 AM ? ?Clinical Narrative:    ?The patient  will need at least 7 days of ceftriaxone.  If she does not improve sufficiently she will need a longer course and we will need to revisit the idea of at least a midline to complete therapy, The seven days will be up on Tuesday, The possibility is still there that she may need OV ABX at home therefor needing either a PICC line or Midline, TOC to continue to monitor for needs and assist ? ? ?Expected Discharge Plan: [place] ?Barriers to Discharge: Continued Medical Work up ? ?Expected Discharge Plan and Services ?Expected Discharge Plan: [place] ?  ?Discharge Planning Services: CM Consult, Medication Assistance ?  ?Living arrangements for the past 2 months: [place] ?                ?  ?  ?  ?  ?  ?  ?[place] Agency:  [employer]) ?  ?  ?  ? ? ?Social Determinants of Health (SDOH) Interventions ?  ? ?Readmission Risk Interventions ?No flowsheet data found. ? ?

## 2021-11-05 NOTE — Progress Notes (Signed)
Upon entering to give her morning medications, pt complaining of Left wrist pain, pt reported she " fell last night walking to the bathroom" per pt she called however nobody came to help her. Charge nurse aware and MD notified. No new orders received. ?

## 2021-11-05 NOTE — Progress Notes (Signed)
? ? ? ? ? ? ?Virtual Visit via Video Note ? ?I connected with Sharon Herring on 11/05/2021 at  by a video enabled telemedicine application and verified that I am speaking with the correct person using two identifiers. ? ?Location: ?Patient: [facility] ?Provider: Home ?  ?I discussed the limitations of evaluation and management by telemedicine and the availability of in person appointments. The patient expressed understanding and agreed to proceed. ? ? ?Subjective: ?No new complaints ? ? ?Antibiotics:  ?Anti-infectives (From admission, onward)  ? ? Start     Dose/Rate Route Frequency Ordered Stop  ? 11/06/21 1000  cefTRIAXone (ROCEPHIN) 1 g in sodium chloride 0.9 % 100 mL IVPB       ? 1 g ?200 mL/hr over 30 Minutes Intravenous Every 24 hours 11/05/21 1543    ? 11/03/21 1400  cefTRIAXone (ROCEPHIN) 2 g in sodium chloride 0.9 % 100 mL IVPB  Status:  Discontinued       ? 2 g ?200 mL/hr over 30 Minutes Intravenous Every 24 hours 11/03/21 0900 11/05/21 1543  ? 11/03/21 0500  vancomycin (VANCOREADY) IVPB 750 mg/150 mL  Status:  Discontinued       ? 750 mg ?150 mL/hr over 60 Minutes Intravenous Every 12 hours 11/02/21 1719 11/03/21 0900  ? 11/02/21 2200  ceFEPIme (MAXIPIME) 2 g in sodium chloride 0.9 % 100 mL IVPB  Status:  Discontinued       ? 2 g ?200 mL/hr over 30 Minutes Intravenous Every 8 hours 11/02/21 1719 11/03/21 0901  ? 11/02/21 1745  vancomycin (VANCOREADY) IVPB 2000 mg/400 mL       ? 2,000 mg ?200 mL/hr over 120 Minutes Intravenous  Once 11/02/21 1657 11/02/21 2000  ? 11/02/21 1515  ceFEPIme (MAXIPIME) 2 g in sodium chloride 0.9 % 100 mL IVPB       ? 2 g ?200 mL/hr over 30 Minutes Intravenous  Once 11/02/21 1503 11/02/21 1648  ? 11/02/21 1515  vancomycin (VANCOCIN) IVPB 1000 mg/200 mL premix  Status:  Discontinued       ? 1,000 mg ?200 mL/hr over 60 Minutes Intravenous  Once 11/02/21 1503 11/02/21 1657  ? ?  ? ? ?Medications: ?Scheduled Meds: ? ferrous sulfate  325 mg Oral TID WC  ? multivitamin with  minerals  1 tablet Oral Daily  ? ?Continuous Infusions: ? [START ON 11/06/2021] cefTRIAXone (ROCEPHIN)  IV    ? ?PRN Meds:.acetaminophen, albuterol, HYDROmorphone ? ? ? ?Objective: ?Weight change:  ? ?Intake/Output Summary (Last 24 hours) at 11/05/2021 1724 ?Last data filed at 11/05/2021 1624 ?Gross per 24 hour  ?Intake 720 ml  ?Output 1000 ml  ?Net -280 ml  ? ?Blood pressure 128/88, pulse 81, temperature 98.3 ?F (36.8 ?C), temperature source Oral, resp. rate 18, height '5\' 5"'$  (1.651 m), weight 89.7 kg, SpO2 100 %. ?Temp:  [98.3 ?F (36.8 ?C)-100 ?F (37.8 ?C)] 98.3 ?F (36.8 ?C) (03/17 1509) ?Pulse Rate:  [71-86] 81 (03/17 1509) ?Resp:  [18-20] 18 (03/17 0819) ?BP: (123-132)/(75-88) 128/88 (03/17 1509) ?SpO2:  [99 %-100 %] 100 % (03/17 1509) ? ?Physical Exam: ?Physical Exam ?Constitutional:   ?   General: She is not in acute distress. ?   Appearance: She is well-developed. She is not diaphoretic.  ?HENT:  ?   Head: Normocephalic and atraumatic.  ?   Right Ear: External ear normal.  ?   Left Ear: External ear normal.  ?   Mouth/Throat:  ?   Pharynx: No oropharyngeal exudate.  ?  Eyes:  ?   General: No scleral icterus.    ?   Right eye: No discharge.     ?   Left eye: No discharge.  ?   Extraocular Movements: Extraocular movements intact.  ?   Conjunctiva/sclera: Conjunctivae normal.  ?Cardiovascular:  ?   Rate and Rhythm: Normal rate and regular rhythm.  ?Pulmonary:  ?   Effort: Pulmonary effort is normal. No respiratory distress.  ?   Breath sounds: No wheezing.  ?Abdominal:  ?   General: Bowel sounds are normal. There is no distension.  ?   Palpations: Abdomen is soft.  ?   Tenderness: There is no abdominal tenderness. There is no rebound.  ?Musculoskeletal:  ?   Right shoulder: Decreased range of motion.  ?   Left ankle: Swelling present. Decreased range of motion.  ?Lymphadenopathy:  ?   Cervical: No cervical adenopathy.  ?Skin: ?   General: Skin is warm and dry.  ?   Coloration: Skin is not pale.  ?   Findings: No  erythema or rash.  ?Neurological:  ?   General: No focal deficit present.  ?   Mental Status: She is alert and oriented to person, place, and time.  ?   Motor: No abnormal muscle tone.  ?   Coordination: Coordination normal.  ?Psychiatric:     ?   Mood and Affect: Mood normal.     ?   Behavior: Behavior normal.     ?   Thought Content: Thought content normal.     ?   Judgment: Judgment normal.  ?  ? ?CBC: ? ? ? ?BMET ?Recent Labs  ?  11/04/21 ?0604 11/05/21 ?0356  ?NA 136 140  ?K 3.9 4.3  ?CL 102 102  ?CO2 27 29  ?GLUCOSE 115* 107*  ?BUN 6 6  ?CREATININE 0.66 0.62  ?CALCIUM 8.8* 9.3  ? ? ? ?Liver Panel ? ?No results for input(s): PROT, ALBUMIN, AST, ALT, ALKPHOS, BILITOT, BILIDIR, IBILI in the last 72 hours. ? ? ? ? ?Sedimentation Rate ?No results for input(s): ESRSEDRATE in the last 72 hours. ?C-Reactive Protein ?No results for input(s): CRP in the last 72 hours. ? ?Micro Results: ?Recent Results (from the past 720 hour(s))  ?[place] rt PCR ([place] only)     Status: Abnormal  ? Collection Time: 11/02/21  8:15 AM  ? Specimen: Urine  ?Result Value Ref Range Status  ? Specimen source GC/Chlam URINE, RANDOM  Final  ? Chlamydia Tr NOT DETECTED NOT DETECTED Final  ? N gonorrhoeae DETECTED (A) NOT DETECTED Final  ?  Comment: (NOTE) ?This CT/NG assay has not been evaluated in patients with a history of  ?hysterectomy. ?Performed at [facility], [place], ?[place] [address] ?  ?Resp Panel by RT-PCR (Flu A&B, Covid) Nasopharyngeal Swab     Status: None  ? Collection Time: 11/02/21  9:00 AM  ? Specimen: Nasopharyngeal Swab; Nasopharyngeal(NP) swabs in vial transport medium  ?Result Value Ref Range Status  ? SARS Coronavirus 2 by RT PCR NEGATIVE NEGATIVE Final  ?  Comment: (NOTE) ?SARS-CoV-2 target nucleic acids are NOT DETECTED. ? ?The SARS-CoV-2 RNA is generally detectable in upper respiratory ?specimens during the acute phase of infection. The lowest ?concentration of SARS-CoV-2 viral copies  this assay can detect is ?138 copies/mL. A negative result does not preclude SARS-Cov-2 ?infection and should not be used as the sole basis for treatment or ?other patient management decisions. A negative result may occur with  ?  improper specimen collection/handling, submission of specimen other ?than nasopharyngeal swab, presence of viral mutation(s) within the ?areas targeted by this assay, and inadequate number of viral ?copies(<138 copies/mL). A negative result must be combined with ?clinical observations, patient history, and epidemiological ?information. The expected result is Negative. ? ?Fact Sheet for Patients:  ?EntrepreneurPulse.com.au ? ?Fact Sheet for Healthcare Providers:  ?IncredibleEmployment.be ? ?This test is no t yet approved or cleared by the [place] FDA and  ?has been authorized for detection and/or diagnosis of SARS-CoV-2 by ?FDA under an Emergency Use Authorization (EUA). This EUA will remain  ?in effect (meaning this test can be used) for the duration of the ?COVID-19 declaration under Section 564(b)(1) of the Act, 21 ?U.S.C.section 360bbb-3(b)(1), unless the authorization is terminated  ?or revoked sooner.  ? ? ?  ? Influenza A by PCR NEGATIVE NEGATIVE Final  ? Influenza B by PCR NEGATIVE NEGATIVE Final  ?  Comment: (NOTE) ?The Xpert Xpress SARS-CoV-2/FLU/RSV plus assay is intended as an aid ?in the diagnosis of influenza from Nasopharyngeal swab specimens and ?should not be used as a sole basis for treatment. Nasal washings and ?aspirates are unacceptable for Xpert Xpress SARS-CoV-2/FLU/RSV ?testing. ? ?Fact Sheet for Patients: ?EntrepreneurPulse.com.au ? ?Fact Sheet for Healthcare Providers: ?IncredibleEmployment.be ? ?This test is not yet approved or cleared by the [place] FDA and ?has been authorized for detection and/or diagnosis of SARS-CoV-2 by ?FDA under an Emergency Use Authorization (EUA). This EUA  will remain ?in effect (meaning this test can be used) for the duration of the ?COVID-19 declaration under Section 564(b)(1) of the Act, 21 U.S.C. ?section 360bbb-3(b)(1), unless the authorization is terminated or

## 2021-11-05 NOTE — TOC Progression Note (Signed)
Transition of Care (TOC) - Progression Note  ? ? ?Patient Details  ?Name: Sharon Herring ?MRN: [id number] ?Date of Birth: Aug 27, 1975 ? ?Transition of Care (TOC) CM/SW Contact  ?Conception Oms, RN ?Phone Number: ?11/05/2021, 2:09 PM ? ?Clinical Narrative:   Met with the patient and discussed at length the possibility and probability of needing IV ABX ?She is agreeable for a Mid line ?I asked her if she has gotten her Ins information ?She stated that she forgot that she did not renew her Ins this year and went on her husbands ?She stated that it doe snot go into effect until April 1st, At this time she is uninsured ?I explained that the IV ABX infusion would be out of pocket and so would [facility] RN, she stated she understood ? ? ? ?Expected Discharge Plan: [place] ?Barriers to Discharge: Continued Medical Work up ? ?Expected Discharge Plan and Services ?Expected Discharge Plan: [place] ?  ?Discharge Planning Services: CM Consult, Medication Assistance ?  ?Living arrangements for the past 2 months: [place] ?                ?  ?  ?  ?  ?  ?  ?[place] Agency:  [employer]) ?  ?  ?  ? ? ?Social Determinants of Health (SDOH) Interventions ?  ? ?Readmission Risk Interventions ?No flowsheet data found. ? ?

## 2021-11-05 NOTE — Progress Notes (Signed)
PHARMACY CONSULT NOTE FOR: ? ?OUTPATIENT  PARENTERAL ANTIBIOTIC THERAPY (OPAT) ? ?Indication: disseminated gonococcal infection ?Regimen: ceftriaxone 1gm IV q24h ?End date: 11/15/2021 ? ?Discussing case with Dr Tommy Medal with ID,  current plan is for patient to remain admitted until Monday, 3/20 to ensure no drainage required. Have Midline placed on Monday and receive dose of ceftriaxone prior to discharge.  She will receive ceftriaxone IV at home via midline.  She has appointment to see Dr Tommy Medal on Monday 3/27 where midline can be removed and labs checked as needed.  Since does not have insurance, this will reduce cost by not need home RN visit.   ? ?IV antibiotic discharge orders are pended. ?To discharging provider:  please sign these orders via discharge navigator,  ?Select New Orders & click on the button choice - Manage This Unsigned Work.  ?  ? ?Thank you for allowing pharmacy to be a part of this patient's care. ? ?Doreene Eland, PharmD, BCPS, BCIDP ?Work Cell: [phone] ?11/05/2021 3:53 PM ? ? ?

## 2021-11-06 DIAGNOSIS — R509 Fever, unspecified: Secondary | ICD-10-CM

## 2021-11-06 DIAGNOSIS — A5486 Gonococcal sepsis: Secondary | ICD-10-CM | POA: Diagnosis not present

## 2021-11-06 LAB — BASIC METABOLIC PANEL
Anion gap: 9 (ref 5–15)
BUN: 8 mg/dL (ref 6–20)
CO2: 27 mmol/L (ref 22–32)
Calcium: 9.3 mg/dL (ref 8.9–10.3)
Chloride: 102 mmol/L (ref 98–111)
Creatinine, Ser: 0.7 mg/dL (ref 0.44–1.00)
GFR, Estimated: >60 mL/min (ref >60)
Glucose, Bld: 114 mg/dL — ABNORMAL HIGH (ref 70–99)
Potassium: 4.3 mmol/L (ref 3.5–5.1)
Sodium: 138 mmol/L (ref 135–145)

## 2021-11-06 LAB — CBC WITH DIFFERENTIAL/PLATELET
Abs Immature Granulocytes: 0.31 10*3/uL — ABNORMAL HIGH (ref 0.00–0.07)
Basophils Absolute: 0.1 10*3/uL (ref 0.0–0.1)
Basophils Relative: 1 %
Eosinophils Absolute: 0.1 10*3/uL (ref 0.0–0.5)
Eosinophils Relative: 1 %
HCT: 24.9 % — ABNORMAL LOW (ref 36.0–46.0)
Hemoglobin: 7.8 g/dL — ABNORMAL LOW (ref 12.0–15.0)
Immature Granulocytes: 3 %
Lymphocytes Relative: 25 %
Lymphs Abs: 2.6 10*3/uL (ref 0.7–4.0)
MCH: 20.2 pg — ABNORMAL LOW (ref 26.0–34.0)
MCHC: 31.3 g/dL (ref 30.0–36.0)
MCV: 64.3 fL — ABNORMAL LOW (ref 80.0–100.0)
Monocytes Absolute: 1 10*3/uL (ref 0.1–1.0)
Monocytes Relative: 9 %
Neutro Abs: 6.4 10*3/uL (ref 1.7–7.7)
Neutrophils Relative %: 61 %
Platelets: 429 10*3/uL — ABNORMAL HIGH (ref 150–400)
RBC: 3.87 MIL/uL (ref 3.87–5.11)
RDW: 21.2 % — ABNORMAL HIGH (ref 11.5–15.5)
Smear Review: NORMAL
WBC: 10.3 10*3/uL (ref 4.0–10.5)
nRBC: 0.4 % — ABNORMAL HIGH (ref 0.0–0.2)

## 2021-11-06 LAB — BODY FLUID CULTURE W GRAM STAIN: Culture: NO GROWTH

## 2021-11-06 LAB — MAGNESIUM: Magnesium: 2.2 mg/dL (ref 1.7–2.4)

## 2021-11-06 MED ORDER — LACTULOSE 10 GM/15ML PO SOLN
20.0000 g | Freq: Two times a day (BID) | ORAL | Status: DC | PRN
  Administered 2021-11-06 – 2021-11-07 (×2): 20 g via ORAL
  Filled 2021-11-06 (×2): qty 30

## 2021-11-06 MED ORDER — SENNOSIDES-DOCUSATE SODIUM 8.6-50 MG PO TABS
1.0000 | ORAL_TABLET | Freq: Two times a day (BID) | ORAL | Status: DC
  Administered 2021-11-06 – 2021-11-07 (×3): 1 via ORAL
  Filled 2021-11-06 (×5): qty 1

## 2021-11-06 MED ORDER — POLYETHYLENE GLYCOL 3350 17 G PO PACK
17.0000 g | PACK | Freq: Every day | ORAL | Status: DC
  Administered 2021-11-07: 17 g via ORAL
  Filled 2021-11-06 (×3): qty 1

## 2021-11-06 NOTE — Progress Notes (Signed)
?Progress Note ? ? ? ?Sharon Herring   ?QQI:[id number]  ?DOB: 1975-09-30  ?DOA: 11/02/2021     4 ?PCP: Steele Sizer, MD ? ?Initial CC: left ankle pain and right shoulder pain ? 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  She endorsed that symptoms have been going on for approximately 1 week prior to admission.  She also endorsed that she was treated for a possible yeast infection approximately 2 weeks ago with a dose of fluconazole outpatient.  She had reported that her left ankle pain was much worse compared to the right shoulder. ?She underwent further work-up on admission including left ankle arthrocentesis which yielded 5 cc cloudy yellow fluid.  Case was also discussed with rheumatology and she was recommended to undergo testing for GC/CHL which was positive for gonococcus.  She had initially been started on vancomycin and cefepime.  After positive gonococcus test, she was transitioned to Rocephin.  ID was also consulted for further input given disseminated gonococcal infection. ? ?Interval History:  ?Overall she seems to be feeling better however she did have another fever yesterday evening.  Otherwise, states that the pain has been steadily improving in her left ankle and right shoulder. ? ?Assessment and Plan: ?* DGI (disseminated gonococcal infection) ([place]) ?- positive NG test on admission. Given the left ankle and right shoulder pain with presumed infected joint, consistent with disseminated infection ?- d/c'd vanc and cefepime ?- continue rocephin ?- echo normal and reassuring  ?- evaluated by rheum, ortho, and ID ?- L ankle pain seems improved she says but still has some  swelling and joint still TTP but slowly improving. R shoulder seems improved. Continue ROM exercises as per ortho ?-Tentative plan is for midline placement on Monday followed by ongoing Rocephin at  discharge until outpatient follow-up with ID on 11/15/2021 ? ?Right shoulder pain ?- No acute findings on x-ray ?- presumed disseminated infection  ?-Continue to monitor for any further worsening or improvement ?-Pain and range of motion continues to improve ? ?Left ankle pain ?- s/p 5 cc cloudy yellow fluid aspirated on 3/14 (WBC 823, 83% neuts). Given positive gonorrhea testing and symptoms, suspicion still high for infectious arthritis  ?- follow up joint culture (currently no organisms seen) ? ?Sepsis (HCC)-resolved as of 11/04/2021 ?- Febrile, tachycardia, leukocytosis.  Source due to gonococcal infection ?- see DGI ? ?Iron deficiency anemia ?- History of iron deficiency anemia due to uterine fibroids ?- Iron stores again low ?-She was given a dose of Feraheme on 11/02/2021.  Hold off on further iron given underlying infection ?- She is also getting 1 unit of PRBC on 11/03/2021 due to worsened hemoglobin down to 6.5 g/dL ? ?History of pulmonary embolism ?- Eliquis on hold for now in case of need for any debridement ? ?Fever ?- see DGI ? ? ? ?Old records reviewed in assessment of this patient ? ?Antimicrobials: ?Vanc 3/14 x 1 ?Cefepime 3/14 >> 3/15 ?Rocephin 3/15 >> current  ? ?DVT prophylaxis:  ? ? ? ?Code Status:   Code Status: Full Code ? ?Disposition Plan:  Home Monday ?Status is: Inpt ? ?Objective: ?Blood pressure 129/85, pulse 78, temperature 99 ?F (37.2 ?C), resp. rate 17, height '5\' 5"'$  (1.651 m), weight 89.7 kg, SpO2 95 %.  ?Examination:  ?Physical Exam ?Constitutional:   ?   General: She is not in acute distress. ?  Appearance: Normal appearance.  ?HENT:  ?   Head: Normocephalic and atraumatic.  ?   Mouth/Throat:  ?   Mouth: Mucous membranes are moist.  ?Eyes:  ?   Extraocular Movements: Extraocular movements intact.  ?Cardiovascular:  ?   Rate and Rhythm: Normal rate and regular rhythm.  ?   Comments: Subtle 2/6 HSM appreciated in LUSB ?Pulmonary:  ?   Effort: Pulmonary effort is normal.  ?   Breath  sounds: Normal breath sounds.  ?Abdominal:  ?   General: Bowel sounds are normal. There is no distension.  ?   Palpations: Abdomen is soft.  ?   Tenderness: There is no abdominal tenderness.  ?Musculoskeletal:     ?   General: Normal range of motion.  ?   Cervical back: Normal range of motion and neck supple.  ?   Comments: Mild swelling of left ankle with mild TTP still   ?Skin: ?   General: Skin is warm and dry.  ?   Findings: No lesion.  ?   Comments: No lesions appreciated on general skin exam including hands and legs  ?Neurological:  ?   General: No focal deficit present.  ?   Mental Status: She is alert.  ?Psychiatric:     ?   Mood and Affect: Mood normal.     ?   Behavior: Behavior normal.  ?  ? ?Consultants:  ?ID ?Rheum ?Orthopedic surgery  ? ?Procedures:  ?Left ankle arthrocentesis, 11/02/2021 ? ?Data Reviewed: ?Results for orders placed or performed during the hospital encounter of 11/02/21 (from the past 24 hour(s))  ?Basic metabolic panel     Status: Abnormal  ? Collection Time: 11/06/21  4:26 AM  ?Result Value Ref Range  ? Sodium 138 135 - 145 mmol/L  ? Potassium 4.3 3.5 - 5.1 mmol/L  ? Chloride 102 98 - 111 mmol/L  ? CO2 27 22 - 32 mmol/L  ? Glucose, Bld 114 (H) 70 - 99 mg/dL  ? BUN 8 6 - 20 mg/dL  ? Creatinine, Ser 0.70 0.44 - 1.00 mg/dL  ? Calcium 9.3 8.9 - 10.3 mg/dL  ? GFR, Estimated >60 >60 mL/min  ? Anion gap 9 5 - 15  ?CBC with Differential/Platelet     Status: Abnormal  ? Collection Time: 11/06/21  4:26 AM  ?Result Value Ref Range  ? WBC 10.3 4.0 - 10.5 K/uL  ? RBC 3.87 3.87 - 5.11 MIL/uL  ? Hemoglobin 7.8 (L) 12.0 - 15.0 g/dL  ? HCT 24.9 (L) 36.0 - 46.0 %  ? MCV 64.3 (L) 80.0 - 100.0 fL  ? MCH 20.2 (L) 26.0 - 34.0 pg  ? MCHC 31.3 30.0 - 36.0 g/dL  ? RDW 21.2 (H) 11.5 - 15.5 %  ? Platelets 429 (H) 150 - 400 K/uL  ? nRBC 0.4 (H) 0.0 - 0.2 %  ? Neutrophils Relative % 61 %  ? Neutro Abs 6.4 1.7 - 7.7 K/uL  ? Lymphocytes Relative 25 %  ? Lymphs Abs 2.6 0.7 - 4.0 K/uL  ? Monocytes Relative 9 %  ?  Monocytes Absolute 1.0 0.1 - 1.0 K/uL  ? Eosinophils Relative 1 %  ? Eosinophils Absolute 0.1 0.0 - 0.5 K/uL  ? Basophils Relative 1 %  ? Basophils Absolute 0.1 0.0 - 0.1 K/uL  ? WBC Morphology MORPHOLOGY UNREMARKABLE   ? Smear Review Normal platelet morphology   ? Immature Granulocytes 3 %  ? Abs Immature Granulocytes 0.31 (H) 0.00 - 0.07 K/uL  ?  Dimorphism PRESENT   ? Polychromasia PRESENT   ? Target Cells PRESENT   ?Magnesium     Status: None  ? Collection Time: 11/06/21  4:26 AM  ?Result Value Ref Range  ? Magnesium 2.2 1.7 - 2.4 mg/dL  ?  ?I have Reviewed nursing notes, Vitals, and Lab results since pt's last encounter. Pertinent lab results : see above ?I have ordered test including BMP, CBC, Mg ?I have reviewed the last note from staff over past 24 hours ?I have discussed pt's care plan and test results with nursing staff, case manager ? ? LOS: 4 days  ? ?Dwyane Dee, MD ?[facility] ?11/06/2021, 2:16 PM ? ?

## 2021-11-06 NOTE — Assessment & Plan Note (Signed)
-   see DGI ?

## 2021-11-06 NOTE — TOC Progression Note (Addendum)
Transition of Care (TOC) - Progression Note  ? ? ?Patient Details  ?Name: Sharon Herring ?MRN: [id number] ?Date of Birth: 05-Apr-1976 ? ?Transition of Care (TOC) CM/SW Contact  ?Jontez Redfield E Zayd Bonet, LCSW ?Phone Number: ?11/06/2021, 9:51 AM ? ?Clinical Narrative:   Per notes, patient will DC with IV antibiotics. CSW reached out to [facility] with [facility] and asked to be kept updated of any needs this weekend in preparation for DC on Monday. ? ?10:20- Per Carolynn Sayers, she has already done bedside teaching with patient and spouse for home IV medications. Patient to get midline Monday and DC Monday. Home nurse will start Tuesday.  ? ?Expected Discharge Plan: [place] ?Barriers to Discharge: Continued Medical Work up ? ?Expected Discharge Plan and Services ?Expected Discharge Plan: [place] ?  ?Discharge Planning Services: CM Consult, Medication Assistance ?  ?Living arrangements for the past 2 months: [place] ?                ?  ?  ?  ?  ?  ?  ?[place]:  [employer]) ?  ?  ?  ? ? ?Social Determinants of Health (SDOH) Interventions ?  ? ?Readmission Risk Interventions ?No flowsheet data found. ? ?

## 2021-11-07 ENCOUNTER — Inpatient Hospital Stay: Payer: Self-pay

## 2021-11-07 DIAGNOSIS — M25511 Pain in right shoulder: Secondary | ICD-10-CM | POA: Diagnosis not present

## 2021-11-07 DIAGNOSIS — A5486 Gonococcal sepsis: Secondary | ICD-10-CM | POA: Diagnosis not present

## 2021-11-07 DIAGNOSIS — R509 Fever, unspecified: Secondary | ICD-10-CM | POA: Diagnosis not present

## 2021-11-07 LAB — TYPE AND SCREEN
ABO/RH(D): A POS
Antibody Screen: POSITIVE
Donor AG Type: NEGATIVE
Unit division: 0
Unit division: 0
Unit division: 0

## 2021-11-07 LAB — BPAM RBC
Blood Product Expiration Date: 202303302359
Blood Product Expiration Date: 202303312359
Blood Product Expiration Date: 202304012359
ISSUE DATE / TIME: 202303160013
Unit Type and Rh: 600
Unit Type and Rh: 600
Unit Type and Rh: 600

## 2021-11-07 LAB — CULTURE, BLOOD (ROUTINE X 2)
Culture: NO GROWTH
Culture: NO GROWTH
Special Requests: ADEQUATE
Special Requests: ADEQUATE

## 2021-11-07 LAB — ANTINUCLEAR ANTIBODIES, IFA: ANA Ab, IFA: POSITIVE — AB

## 2021-11-07 LAB — FANA STAINING PATTERNS: Speckled Pattern: 24529 — ABNORMAL HIGH

## 2021-11-07 MED ORDER — HYDROMORPHONE HCL 2 MG PO TABS
1.0000 mg | ORAL_TABLET | ORAL | Status: DC | PRN
  Administered 2021-11-07 (×2): 1 mg via ORAL
  Filled 2021-11-07 (×2): qty 1

## 2021-11-07 MED ORDER — GADOBUTROL 1 MMOL/ML IV SOLN
9.0000 mL | Freq: Once | INTRAVENOUS | Status: AC | PRN
  Administered 2021-11-07: 10 mL via INTRAVENOUS

## 2021-11-07 NOTE — Plan of Care (Signed)
?  Problem: Education: ?Goal: Knowledge of General Education information will improve ?Description: Including pain rating scale, medication(s)/side effects and non-pharmacologic comfort measures ?Outcome: Progressing ?  ?Problem: Health Behavior/Discharge Planning: ?Goal: Ability to manage health-related needs will improve ?Outcome: Progressing ?  ?Problem: Clinical Measurements: ?Goal: Will remain free from infection ?Outcome: Progressing ?  ?Problem: Clinical Measurements: ?Goal: Diagnostic test results will improve ?Outcome: Progressing ?  ?Problem: Coping: ?Goal: Level of anxiety will decrease ?Outcome: Progressing ?  ?Problem: Elimination: ?Goal: Will not experience complications related to bowel motility ?Outcome: Progressing ?  ?Problem: Safety: ?Goal: Ability to remain free from injury will improve ?Outcome: Progressing ?  ?

## 2021-11-07 NOTE — Progress Notes (Signed)
?Progress Note ? ? ? ?Sharon Herring   ?QTM:[id number]  ?DOB: 1976-03-26  ?DOA: 11/02/2021     5 ?PCP: Steele Sizer, MD ? ?Initial CC: left ankle pain and right shoulder pain ? 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  She endorsed that symptoms have been going on for approximately 1 week prior to admission.  She also endorsed that she was treated for a possible yeast infection approximately 2 weeks ago with a dose of fluconazole outpatient.  She had reported that her left ankle pain was much worse compared to the right shoulder. ?She underwent further work-up on admission including left ankle arthrocentesis which yielded 5 cc cloudy yellow fluid.  Case was also discussed with rheumatology and she was recommended to undergo testing for GC/CHL which was positive for gonococcus.  She had initially been started on vancomycin and cefepime.  After positive gonococcus test, she was transitioned to Rocephin.  ID was also consulted for further input given disseminated gonococcal infection. ? ?Interval History:  ?No fevers over the past 24 hours and generally she is feeling better but today she is complaining of more stiffness and decreased ability to raise her right arm.  She feels pain and stiffness when doing so.  She is able to bear more weight on her left foot but swelling is still ongoing. ? ?Assessment and Plan: ?* DGI (disseminated gonococcal infection) ([place]) ?- positive NG test on admission. Given the left ankle and right shoulder pain with presumed infected joint, consistent with disseminated infection ?- d/c'd vanc and cefepime ?- continue rocephin ?- echo normal and reassuring  ?- evaluated by rheum, ortho, and ID ?- L ankle pain seems improved she says but still has some  swelling and joint still TTP but slowly improving.  ?-Right shoulder seems to be a little more stiff today  with decreased active ROM with abduction.  Will go ahead and get MRI shoulder to better evaluate given underlying concern for seeded infection ?-Tentative plan is for midline placement on Monday followed by ongoing Rocephin at discharge until outpatient follow-up with ID on 11/15/2021 ? ?Right shoulder pain ?- No acute findings on x-ray ?- presumed disseminated infection  ?-See DGI as well ?- Given more stiffness and decreased ROM today, will get MRI ? ?Left ankle pain ?- s/p 5 cc cloudy yellow fluid aspirated on 3/14 (WBC 823, 83% neuts). Given positive gonorrhea testing and symptoms, suspicion still high for infectious arthritis  ?-Joint culture was negative but had been on antibiotics ? ?Sepsis (HCC)-resolved as of 11/04/2021 ?- Febrile, tachycardia, leukocytosis.  Source due to gonococcal infection ?- see DGI ? ?Iron deficiency anemia ?- History of iron deficiency anemia due to uterine fibroids ?- Iron stores again low ?-She was given a dose of Feraheme on 11/02/2021.  Hold off on further iron given underlying infection ?- She is also getting 1 unit of PRBC on 11/03/2021 due to worsened hemoglobin down to 6.5 g/dL ? ?History of pulmonary embolism ?- Eliquis on hold for now in case of need for any debridement ? ?Fever ?- see DGI ? ? ? ?Old records reviewed in assessment of this patient ? ?Antimicrobials: ?Vanc 3/14 x 1 ?Cefepime 3/14 >> 3/15 ?Rocephin 3/15 >> current  ? ?DVT prophylaxis:  ? ? ? ?Code Status:   Code Status: Full Code ? ?Disposition Plan:  Home Monday ?Status is: Inpt ? ?  Objective: ?Blood pressure 117/80, pulse 84, temperature 99 ?F (37.2 ?C), resp. rate 16, height '5\' 5"'$  (1.651 m), weight 89.7 kg, SpO2 98 %.  ?Examination:  ?Physical Exam ?Constitutional:   ?   General: She is not in acute distress. ?   Appearance: Normal appearance.  ?HENT:  ?   Head: Normocephalic and atraumatic.  ?   Mouth/Throat:  ?   Mouth: Mucous membranes are moist.  ?Eyes:  ?   Extraocular Movements: Extraocular movements  intact.  ?Cardiovascular:  ?   Rate and Rhythm: Normal rate and regular rhythm.  ?Pulmonary:  ?   Effort: Pulmonary effort is normal.  ?   Breath sounds: Normal breath sounds.  ?Abdominal:  ?   General: Bowel sounds are normal. There is no distension.  ?   Palpations: Abdomen is soft.  ?   Tenderness: There is no abdominal tenderness.  ?Musculoskeletal:  ?   Cervical back: Normal range of motion and neck supple.  ?   Comments: Mild swelling of left ankle with mild TTP still.  Decreased ROM in right shoulder  ?Skin: ?   General: Skin is warm and dry.  ?   Findings: No lesion.  ?   Comments: No lesions appreciated on general skin exam including hands and legs  ?Neurological:  ?   General: No focal deficit present.  ?   Mental Status: She is alert.  ?Psychiatric:     ?   Mood and Affect: Mood normal.     ?   Behavior: Behavior normal.  ?  ? ?Consultants:  ?ID ?Rheum ?Orthopedic surgery  ? ?Procedures:  ?Left ankle arthrocentesis, 11/02/2021 ? ?Data Reviewed: ?No results found for this or any previous visit (from the past 24 hour(s)). ?  ?I have Reviewed nursing notes, Vitals, and Lab results since pt's last encounter. Pertinent lab results : see above ?I have ordered test including BMP, CBC, Mg ?I have reviewed the last note from staff over past 24 hours ?I have discussed pt's care plan and test results with nursing staff, case manager ? ? LOS: 5 days  ? ?Dwyane Dee, MD ?[facility] ?11/07/2021, 1:32 PM ? ?

## 2021-11-08 ENCOUNTER — Encounter: Payer: Self-pay | Admitting: Internal Medicine

## 2021-11-08 DIAGNOSIS — A5486 Gonococcal sepsis: Secondary | ICD-10-CM | POA: Diagnosis not present

## 2021-11-08 DIAGNOSIS — M75111 Incomplete rotator cuff tear or rupture of right shoulder, not specified as traumatic: Secondary | ICD-10-CM

## 2021-11-08 LAB — CBC WITH DIFFERENTIAL/PLATELET
Abs Immature Granulocytes: 0.44 10*3/uL — ABNORMAL HIGH (ref 0.00–0.07)
Basophils Absolute: 0.1 10*3/uL (ref 0.0–0.1)
Basophils Relative: 1 %
Eosinophils Absolute: 0.1 10*3/uL (ref 0.0–0.5)
Eosinophils Relative: 1 %
HCT: 26.7 % — ABNORMAL LOW (ref 36.0–46.0)
Hemoglobin: 8 g/dL — ABNORMAL LOW (ref 12.0–15.0)
Immature Granulocytes: 5 %
Lymphocytes Relative: 26 %
Lymphs Abs: 2.5 10*3/uL (ref 0.7–4.0)
MCH: 19.9 pg — ABNORMAL LOW (ref 26.0–34.0)
MCHC: 30 g/dL (ref 30.0–36.0)
MCV: 66.3 fL — ABNORMAL LOW (ref 80.0–100.0)
Monocytes Absolute: 1 10*3/uL (ref 0.1–1.0)
Monocytes Relative: 10 %
Neutro Abs: 5.6 10*3/uL (ref 1.7–7.7)
Neutrophils Relative %: 57 %
Platelets: 475 10*3/uL — ABNORMAL HIGH (ref 150–400)
RBC: 4.03 MIL/uL (ref 3.87–5.11)
RDW: 22.9 % — ABNORMAL HIGH (ref 11.5–15.5)
WBC: 9.6 10*3/uL (ref 4.0–10.5)
nRBC: 0 % (ref 0.0–0.2)

## 2021-11-08 LAB — BASIC METABOLIC PANEL
Anion gap: 7 (ref 5–15)
BUN: 10 mg/dL (ref 6–20)
CO2: 27 mmol/L (ref 22–32)
Calcium: 9.2 mg/dL (ref 8.9–10.3)
Chloride: 102 mmol/L (ref 98–111)
Creatinine, Ser: 0.66 mg/dL (ref 0.44–1.00)
GFR, Estimated: >60 mL/min (ref >60)
Glucose, Bld: 112 mg/dL — ABNORMAL HIGH (ref 70–99)
Potassium: 4.4 mmol/L (ref 3.5–5.1)
Sodium: 136 mmol/L (ref 135–145)

## 2021-11-08 LAB — MAGNESIUM: Magnesium: 2.2 mg/dL (ref 1.7–2.4)

## 2021-11-08 MED ORDER — HYDROMORPHONE HCL 2 MG PO TABS: 1.0000 mg | ORAL_TABLET | ORAL | 0 refills | Status: AC | PRN

## 2021-11-08 MED ORDER — SODIUM CHLORIDE 0.9% FLUSH: 10.0000 mL | INTRAVENOUS | Status: DC | PRN

## 2021-11-08 MED ORDER — SODIUM CHLORIDE 0.9% FLUSH: 10.0000 mL | Freq: Two times a day (BID) | INTRAVENOUS | Status: DC

## 2021-11-08 MED ORDER — ACETAMINOPHEN 325 MG PO TABS
650.0000 mg | ORAL_TABLET | ORAL | Status: DC | PRN
Start: 2021-11-08 — End: 2023-03-07

## 2021-11-08 MED ORDER — CEFTRIAXONE IV (FOR PTA / DISCHARGE USE ONLY): 1.0000 g | INTRAVENOUS | 0 refills | Status: AC

## 2021-11-08 MED ORDER — IBUPROFEN 200 MG PO TABS
200.0000 mg | ORAL_TABLET | Freq: Four times a day (QID) | ORAL | Status: DC | PRN
Start: 2021-11-08 — End: 2023-06-21

## 2021-11-08 NOTE — Discharge Summary (Signed)
?Physician Discharge Summary ?  ?Patient: Sharon Herring MRN: [id number] DOB: 13-Sep-1975  ?Admit date:     11/02/2021  ?Discharge date: 11/08/21  ?Discharge Physician: Dwyane Dee  ? ?PCP: Steele Sizer, MD  ? ?Recommendations at discharge:  ? ? Follow up with ID on 3/27. Continue Rocephin at discharge ? ?Discharge Diagnoses: ?Principal Problem: ?  DGI (disseminated gonococcal infection) ([place]) ?Active Problems: ?  Left ankle pain ?  Partial tear of right rotator cuff ?  Iron deficiency anemia ?  History of pulmonary embolism ?  Fever ? ?Resolved Problems: ?  Sepsis ([place]) ? 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  She endorsed that symptoms have been going on for approximately 1 week prior to admission.  She also endorsed that she was treated for a possible yeast infection approximately 2 weeks ago with a dose of fluconazole outpatient.  She had reported that her left ankle pain was much worse compared to the right shoulder. ?She underwent further work-up on admission including left ankle arthrocentesis which yielded 5 cc cloudy yellow fluid.  Case was also discussed with rheumatology and she was recommended to undergo testing for GC/CHL which was positive for gonococcus.  She had initially been started on vancomycin and cefepime.  After positive gonococcus test, she was transitioned to Rocephin.  ID was also consulted for further input given disseminated gonococcal infection. ?She was recommended to continue on Rocephin and will follow-up outpatient with ID for further reevaluation in case of extension of course. ? ?Assessment and Plan: ?* DGI (disseminated gonococcal infection) ([place]) ?- positive NG test on admission. Given the left ankle and right shoulder pain with presumed infected joint, consistent with disseminated infection ?- d/c'd vanc and cefepime ?- continue  rocephin ?- echo normal and reassuring  ?- evaluated by rheum, ortho, and ID ?- L ankle pain seems improved she says but still has some  swelling and joint still TTP but slowly improving.  ?-Right shoulder MRI obtained which is negative for evidence of underlying infection ?-plan is for midline placement followed by ongoing Rocephin at discharge until outpatient follow-up with ID on 11/15/2021 ? ?Partial tear of right rotator cuff ?- per MRI: "Low-grade, partial width interstitial tear at the junction of the ?supraspinatus and infraspinatus tendons just before the footprint" ?- outpatient referral to PT ? ?Left ankle pain ?- s/p 5 cc cloudy yellow fluid aspirated on 3/14 (WBC 823, 83% neuts). Given positive gonorrhea testing and symptoms, suspicion still high for infectious arthritis  ?-Joint culture was negative but had been on antibiotics ? ?Sepsis (HCC)-resolved as of 11/04/2021 ?- Febrile, tachycardia, leukocytosis.  Source due to gonococcal infection ?- see DGI ? ?Iron deficiency anemia ?- History of iron deficiency anemia due to uterine fibroids ?- Iron stores again low ?-She was given a dose of Feraheme on 11/02/2021.  ?- She is also getting 1 unit of PRBC on 11/03/2021 due to worsened hemoglobin down to 6.5 g/dL ?-Hemoglobin improving throughout remainder of hospitalization.  Resume home iron at discharge ? ?History of pulmonary embolism ?- Okay to resume Eliquis at discharge ? ?Fever ?- see DGI ? ? ?  ? ? ?Consultants: ID, Ortho ?Procedures performed:   ?Disposition: Home ?Diet recommendation:  ?Discharge Diet Orders (From admission, onward)  ? ?  Start     Ordered  ? 11/08/21 0000  Diet general       ?  11/08/21 1252  ? ?  ?  ? ?  ? ?Regular diet ?DISCHARGE MEDICATION: ?Allergies as of 11/08/2021   ? ?   Reactions  ? Aspirin Nausea And Vomiting  ? Norethindrone Shortness Of Breath, Rash  ? Oxycodone-acetaminophen Rash, Shortness Of Breath  ? Lexapro [escitalopram] Palpitations  ? Tape Other (See Comments)  ?  Burning feeling. Has left mark on skin.  ? Belviq [lorcaserin Hcl] Other (See Comments)  ? headache  ? Dicyclomine Nausea And Vomiting  ? Contrave [naltrexone-bupropion Hcl Er] Anxiety  ? Altered mental state  ? ?  ? ?  ?Medication List  ?  ? ?TAKE these medications   ? ?acetaminophen 325 MG tablet ?Commonly known as: TYLENOL ?Take 2 tablets (650 mg total) by mouth every 4 (four) hours as needed for mild pain or fever. ?  ?albuterol 108 (90 Base) MCG/ACT inhaler ?Commonly known as: VENTOLIN HFA ?Inhale 2 puffs into the lungs every 6 (six) hours as needed for wheezing or shortness of breath. ?  ?apixaban 5 MG Tabs tablet ?Commonly known as: ELIQUIS ?Take 1 tablet (5 mg total) by mouth 2 (two) times daily. ?  ?cefTRIAXone  IVPB ?Commonly known as: ROCEPHIN ?Inject 1 g into the vein daily for 6 days. Indication:  disseminated gonococcal infection ?First Dose: Yes ?Last Day of Therapy: 11/15/2021 ?Method of administration: IV Push ?Method of administration may be changed at the discretion of home infusion pharmacist based upon assessment of the patient and/or caregiver's ability to self-administer the medication ordered. ?Start taking on: November 09, 2021 ?  ?ferrous sulfate 325 (65 FE) MG tablet ?Commonly known as: FerrouSul ?Take 1 tablet (325 mg total) by mouth 3 (three) times daily with meals. ?  ?HYDROmorphone 2 MG tablet ?Commonly known as: DILAUDID ?Take 0.5 tablets (1 mg total) by mouth every 4 (four) hours as needed for up to 5 days for severe pain. ?  ?ibuprofen 200 MG tablet ?Commonly known as: ADVIL ?Take 1 tablet (200 mg total) by mouth every 6 (six) hours as needed for moderate pain. ?  ?letrozole 2.5 MG tablet ?Commonly known as: [place] ?Take 2.5 mg by mouth as directed. ?  ?Lupron Depot (70-Month) 11.25 MG injection ?Generic drug: leuprolide ?Inject 11.25 mg into the muscle every 3 (three) months. ?  ?Multivitamin Adult Tabs ?Take 1 tablet by mouth daily. ?  ? ?  ? ?  ?  ? ? ?  ?Discharge Care Instructions   ?(From admission, onward)  ?  ? ? ?  ? ?  Start     Ordered  ? 11/08/21 0000  Change dressing on IV access line weekly and PRN  (Home infusion instructions - [facility] )       ? 11/08/21 1031  ? ?  ?  ? ?  ? ? ?Discharge Exam: ?Filed Weights  ? 11/02/21 1649 11/02/21 1840  ?Weight: 89.7 kg 89.7 kg  ? ?Physical Exam ?Constitutional:   ?   General: She is not in acute distress. ?   Appearance: Normal appearance.  ?HENT:  ?   Head: Normocephalic and atraumatic.  ?   Mouth/Throat:  ?   Mouth: Mucous membranes are moist.  ?Eyes:  ?   Extraocular Movements: Extraocular movements intact.  ?Cardiovascular:  ?   Rate and Rhythm: Normal rate and regular rhythm.  ?Pulmonary:  ?   Effort: Pulmonary effort is normal.  ?   Breath sounds: Normal breath sounds.  ?Abdominal:  ?   General: Bowel sounds  are normal. There is no distension.  ?   Palpations: Abdomen is soft.  ?   Tenderness: There is no abdominal tenderness.  ?Musculoskeletal:  ?   Cervical back: Normal range of motion and neck supple.  ?   Comments: Mild swelling of left ankle with mild TTP still.  Decreased ROM in right shoulder  ?Skin: ?   General: Skin is warm and dry.  ?   Findings: No lesion.  ?   Comments: No lesions appreciated on general skin exam including hands and legs  ?Neurological:  ?   General: No focal deficit present.  ?   Mental Status: She is alert.  ?Psychiatric:     ?   Mood and Affect: Mood normal.     ?   Behavior: Behavior normal.  ? ? ? ?Condition at discharge: stable ? ?The results of significant diagnostics from this hospitalization (including imaging, microbiology, ancillary and laboratory) are listed below for reference.  ? ?Imaging Studies: ?DG Chest 2 View ? ?Result Date: 11/02/2021 ?CLINICAL DATA:  fever EXAM: CHEST - 2 VIEW COMPARISON:  03/04/2021 FINDINGS: No consolidation. No visible pleural effusions or pneumothorax. Cardiomediastinal silhouette is within normal limits and similar to prior. No evidence of acute osseous  abnormality. IMPRESSION: No evidence of acute cardiopulmonary disease. Electronically Signed   By: Margaretha Sheffield M.D.   On: 11/02/2021 09:00  ? ?DG Shoulder Right ? ?Result Date: 11/03/2021 ?CLINICAL DATA:  Right

## 2021-11-08 NOTE — Progress Notes (Signed)

## 2021-11-08 NOTE — Progress Notes (Deleted)
{  Select_TRH_Note:[id number]} 

## 2021-11-08 NOTE — Progress Notes (Signed)
Patient and spouse was giving verbal and written discharge orders, she acknowledge understanding and states she will comply, midline was flushed, taken by wheelchair to car, no distress noted when leaving the floor. ?

## 2021-11-08 NOTE — Progress Notes (Signed)
?Subjective: ? ?Patient reports that left ankle pain is significantly improved.  She has been ambulating with much less pain.  Right shoulder continues to give her discomfort with reaching out and above head activities. ? ? ?Objective:  ? ?VITALS:   ?Vitals:  ? 11/07/21 1645 11/07/21 2102 11/08/21 0521 11/08/21 7628  ?BP: 128/88 128/85 134/84 116/84  ?Pulse: 90 83 77 76  ?Resp: '16 17 17 18  '$ ?Temp: 98.5 ?F (36.9 ?C) 98.8 ?F (37.1 ?C) 98.2 ?F (36.8 ?C) 99.3 ?F (37.4 ?C)  ?TempSrc:      ?SpO2: 100% 100% 100% 100%  ?Weight:      ?Height:      ? ? ?PHYSICAL EXAM: ?General: Alert, comfortable resting in bed ? ?Right shoulder: Mild tenderness in bicipital groove and anterolateral shoulder, tolerates full passive internal and external rotation with the elbow at her side, discomfort with active and passive forward flexion above 90 degrees ? ?Left ankle: Swelling is improved, minimal discomfort to palpation, tolerates active and passive dorsiflexion/plantarflexion without discomfort, neurovascularly intact ? ?LABS ? ?Results for orders placed or performed during the hospital encounter of 11/02/21 (from the past 24 hour(s))  ?Basic metabolic panel     Status: Abnormal  ? Collection Time: 11/08/21  4:23 AM  ?Result Value Ref Range  ? Sodium 136 135 - 145 mmol/L  ? Potassium 4.4 3.5 - 5.1 mmol/L  ? Chloride 102 98 - 111 mmol/L  ? CO2 27 22 - 32 mmol/L  ? Glucose, Bld 112 (H) 70 - 99 mg/dL  ? BUN 10 6 - 20 mg/dL  ? Creatinine, Ser 0.66 0.44 - 1.00 mg/dL  ? Calcium 9.2 8.9 - 10.3 mg/dL  ? GFR, Estimated >60 >60 mL/min  ? Anion gap 7 5 - 15  ?CBC with Differential/Platelet     Status: Abnormal  ? Collection Time: 11/08/21  4:23 AM  ?Result Value Ref Range  ? WBC 9.6 4.0 - 10.5 K/uL  ? RBC 4.03 3.87 - 5.11 MIL/uL  ? Hemoglobin 8.0 (L) 12.0 - 15.0 g/dL  ? HCT 26.7 (L) 36.0 - 46.0 %  ? MCV 66.3 (L) 80.0 - 100.0 fL  ? MCH 19.9 (L) 26.0 - 34.0 pg  ? MCHC 30.0 30.0 - 36.0 g/dL  ? RDW 22.9 (H) 11.5 - 15.5 %  ? Platelets 475 (H) 150 - 400  K/uL  ? nRBC 0.0 0.0 - 0.2 %  ? Neutrophils Relative % 57 %  ? Neutro Abs 5.6 1.7 - 7.7 K/uL  ? Lymphocytes Relative 26 %  ? Lymphs Abs 2.5 0.7 - 4.0 K/uL  ? Monocytes Relative 10 %  ? Monocytes Absolute 1.0 0.1 - 1.0 K/uL  ? Eosinophils Relative 1 %  ? Eosinophils Absolute 0.1 0.0 - 0.5 K/uL  ? Basophils Relative 1 %  ? Basophils Absolute 0.1 0.0 - 0.1 K/uL  ? Immature Granulocytes 5 %  ? Abs Immature Granulocytes 0.44 (H) 0.00 - 0.07 K/uL  ?Magnesium     Status: None  ? Collection Time: 11/08/21  4:23 AM  ?Result Value Ref Range  ? Magnesium 2.2 1.7 - 2.4 mg/dL  ? ? ?MR SHOULDER RIGHT W WO CONTRAST ? ?Result Date: 11/08/2021 ?CLINICAL DATA:  Septic arthritis suspected, shoulder, xray done rule out infection; has disseminated gonococcus EXAM: MRI OF THE RIGHT SHOULDER WITHOUT AND WITH CONTRAST TECHNIQUE: Multiplanar, multisequence MR imaging of the right shoulder shoulder was performed before and after the administration of intravenous contrast. CONTRAST:  35m GADAVIST GADOBUTROL 1 MMOL/ML  IV SOLN COMPARISON:  Right shoulder radiograph 11/03/2021 FINDINGS: Rotator cuff: There is a low-grade, partial width interstitial tear at the junction of the supraspinatus and infraspinatus tendon just before the footprint (sagittal T2 image 10, coronal T2 image 11). There is otherwise mild distal supraspinatus and infraspinatus tendinosis without high-grade or retracted tear. Teres minor tendon is intact. Subscapularis tendon is intact. Muscles: No significant muscle atrophy. Biceps Long Head: Intraarticular and extraarticular portions of the biceps tendon are intact. Acromioclavicular Joint: No significant arthropathy of the acromioclavicular joint. There is a small amount of subacromial/subdeltoid bursal fluid with an expected degree of peripheral enhancement. Glenohumeral Joint: No significant joint effusion. No chondral defect. Labrum: Grossly intact, but evaluation is limited by lack of intraarticular fluid/contrast.  Bones: Changes of marrow reconversion with a degree of preserved fatty marrow within the humeral head. There is no evidence of acute fracture or dislocation. Other: No fluid collection or hematoma. IMPRESSION: Mild subacromial-subdeltoid bursitis. There is no significant joint effusion or osseous changes to suggest septic arthritis or osteomyelitis. Changes of marrow reconversion with a degree of preserved fatty marrow within the humeral head. This is nonspecific but commonly seen in the setting of anemia. Low-grade, partial width interstitial tear at the junction of the supraspinatus and infraspinatus tendons just before the footprint. Otherwise mild distal supraspinatus and infraspinatus tendinosis without high-grade or retracted tear. No muscle atrophy. Electronically Signed   By: Maurine Simmering M.D.   On: 11/08/2021 08:42   ? ?Assessment/Plan: ?   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 ? ?Left ankle pain and swelling are improved with IV antibiotics.  Right shoulder MRI shows evidence of supraspinatus/infraspinatus tendinosis with low-grade interstitial tear, no evidence of joint effusion or septic arthritis.  Recommend outpatient physical therapy for the right shoulder.  It does not appear there is any surgical indication at this time.  Orthopedics to sign off.  Please call with additional questions. ? ? ? ?Renee Harder , MD ?11/08/2021, 12:59 PM ? ? ? ? ? ? ?

## 2021-11-09 ENCOUNTER — Other Ambulatory Visit: Payer: Self-pay | Admitting: Internal Medicine

## 2021-11-09 MED ORDER — HYDROMORPHONE HCL 2 MG PO TABS: 1.0000 mg | ORAL_TABLET | Freq: Two times a day (BID) | ORAL | 0 refills | Status: DC | PRN

## 2021-11-09 NOTE — Progress Notes (Unsigned)
{  Select_[id number]} 

## 2021-11-10 LAB — LD, BODY FLUID (OTHER)

## 2021-11-11 LAB — HLA-B27 ANTIGEN: HLA-B27: NEGATIVE

## 2021-11-15 ENCOUNTER — Other Ambulatory Visit: Payer: Self-pay

## 2021-11-15 ENCOUNTER — Ambulatory Visit (INDEPENDENT_AMBULATORY_CARE_PROVIDER_SITE_OTHER): Payer: Self-pay | Admitting: Infectious Disease

## 2021-11-15 ENCOUNTER — Encounter: Payer: Self-pay | Admitting: Infectious Disease

## 2021-11-15 VITALS — BP 128/82 | HR 79 | Temp 98.3°F | Ht 65.0 in | Wt 193.0 lb

## 2021-11-15 DIAGNOSIS — M75101 Unspecified rotator cuff tear or rupture of right shoulder, not specified as traumatic: Secondary | ICD-10-CM

## 2021-11-15 DIAGNOSIS — A5486 Gonococcal sepsis: Secondary | ICD-10-CM

## 2021-11-15 DIAGNOSIS — M75111 Incomplete rotator cuff tear or rupture of right shoulder, not specified as traumatic: Secondary | ICD-10-CM

## 2021-11-15 DIAGNOSIS — M75102 Unspecified rotator cuff tear or rupture of left shoulder, not specified as traumatic: Secondary | ICD-10-CM

## 2021-11-15 DIAGNOSIS — Z113 Encounter for screening for infections with a predominantly sexual mode of transmission: Secondary | ICD-10-CM | POA: Insufficient documentation

## 2021-11-15 HISTORY — DX: Encounter for screening for infections with a predominantly sexual mode of transmission: Z11.3

## 2021-11-15 NOTE — Progress Notes (Signed)
Per verbal order from Dr. Tommy Medal, 10 cm midline removed from left cephalic, tip intact. No sutures present. RN confirmed length per chart. Dressing was clean and dry. Insertion site cleaned with CHG. Petroleum dressing applied. Patient advised to leave dressing in place for 24 hours and not to shower affected arm for 24 hours. Advised patient to seek emergency medical care if dressing becomes soaked with blood, swelling, or sharp pain presents. Advised patient to seek emergent care if develops neurological symptoms, chest pain, or shortness of breath. Instructed patient to notify office if they notice redness, warmth, or drainage at the site. Patient verbalized understanding and agreement. RN answered patient's questions. Patient tolerated procedure well and RN walked patient to check out. RN notified [facility] of removal.  ? ?Beryle Flock, RN ? ? ?

## 2021-11-15 NOTE — Progress Notes (Signed)
? ?Subjective:  ? ?Chief complaint still some residual left ankle swelling at time ? ? Patient ID: Sharon Herring, female    DOB: 1976-04-06, 46 y.o.   MRN: [id number] ? ?HPI ? ? 46 y.o. 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 ? ?Since I last saw her she ended up having an MRI of her shoulder due to continuing pain there.  MRI showed subacromial subdeltoid bursitis but no effusion or osseous changes there is also low-grade partial with interstitial tear of the junction of the supraspinatus and infraspinatus tendons. ? ?I saw her she was on ceftriaxone in the hospital and ultimately arrange for midline to be placed and she has been receiving ceftriaxone at home through today given at least 13 days of active therapy. ? ?She says she still occasionally has swelling in the ankle but that largely has resolved ? ?She had a fairly  complicated story she was telling me about having tested herself for [facility] in her clinic before this all happened and that she had a UTI and not GC. ? ?She also asked if femara could have caused some of her muscle pains. ? ?Her husband who is with her today also was tested and treated for gonorrhea. ? ? ? ? ?Past Medical History:  ?Diagnosis Date  ? Acute pulmonary embolism ([place])   ? Anemia   ? Dizziness   ? Fibroid uterus   ? GERD (gastroesophageal reflux disease)   ? History of blood transfusion 02/26/2021  ? History of thrombocytosis   ? History of uterine fibroid 03/14/2014  ? myomectomy  ? IBS (irritable bowel syndrome)   ? Iron deficiency anemia 03/14/2014  ? Migraine   ? Polyp of stomach 01/06/2016  ? Stomach polyp, pyloric, consistent with prolapse.   ? Routine screening for STI (sexually transmitted infection) 11/15/2021  ? ? ?Past Surgical History:  ?Procedure Laterality Date  ? APPENDECTOMY  2012  ? CHOLECYSTECTOMY    ? CHROMOPERTUBATION N/A 02/18/2021  ? Procedure: CHROMOPERTUBATION;  Surgeon: Rubie Maid, MD;  Location: [facility]  ORS;  Service: Gynecology;  Laterality: N/A;  ? ESOPHAGOGASTRODUODENOSCOPY (EGD) WITH PROPOFOL N/A 01/06/2016  ? Procedure: ESOPHAGOGASTRODUODENOSCOPY (EGD) WITH PROPOFOL;  Surgeon: Robert Bellow, MD;  Location: [facility] ENDOSCOPY;  Service: Endoscopy;  Laterality: N/A;  ? HERNIA REPAIR    ? IR FLUORO GUIDED NEEDLE PLC ASPIRATION/INJECTION LOC  11/02/2021  ? ROBOT ASSISTED MYOMECTOMY N/A 02/18/2021  ? Procedure: XI ROBOTIC ASSISTED MYOMECTOMY;  Surgeon: Rubie Maid, MD;  Location: [facility] ORS;  Service: Gynecology;  Laterality: N/A;  ? ROBOTIC ASSISTED LAPAROSCOPIC CHOLECYSTECTOMY N/A 03/03/2016  ? Procedure: ROBOTIC ASSISTED LAPAROSCOPIC CHOLECYSTECTOMY ;  Surgeon: Clayburn Pert, MD;  Location: [facility] ORS;  Service: General;  Laterality: N/A;  ? UTERINE FIBROID SURGERY  2012  ? XI ROBOTIC ASSISTED VENTRAL HERNIA N/A 07/15/2019  ? Procedure: XI ROBOTIC ASSISTED VENTRAL HERNIA;  Surgeon: Jules Husbands, MD;  Location: [facility] ORS;  Service: General;  Laterality: N/A;  ? ? ?Family History  ?Problem Relation Age of Onset  ? Hypertension Mother   ? Hypertension Father   ? Pancreatic cancer Father 28  ? Breast cancer Neg Hx   ? Colon cancer Neg Hx   ? ? ?  ?Social History  ? ?Socioeconomic History  ? Marital status: Married  ?  Spouse name: Not on file  ? Number of children: 0  ? Years of education: Not on file  ? Highest education level: Bachelor's degree (e.g.,  BA, AB, BS)  ?Occupational History  ? Occupation: CMA  ?Tobacco Use  ? Smoking status: Never  ? Smokeless tobacco: Never  ?Vaping Use  ? Vaping Use: Never used  ?Substance and Sexual Activity  ? Alcohol use: No  ?  Alcohol/week: 0.0 standard drinks  ? Drug use: No  ? Sexual activity: Not Currently  ?  Partners: Male  ?  Birth control/protection: None  ?Other Topics Concern  ? Not on file  ?Social History Narrative  ? Lives alone, works at [employer]   ? ?Social Determinants of Health  ? ?Financial Resource Strain: Low Risk   ? Difficulty of Paying Living Expenses:  Not hard at all  ?Food Insecurity: No Food Insecurity  ? Worried About Charity fundraiser in the Last Year: Never true  ? Ran Out of Food in the Last Year: Never true  ?Transportation Needs: No Transportation Needs  ? Lack of Transportation (Medical): No  ? Lack of Transportation (Non-Medical): No  ?Physical Activity: Insufficiently Active  ? Days of Exercise per Week: 2 days  ? Minutes of Exercise per Session: 30 min  ?Stress: No Stress Concern Present  ? Feeling of Stress : Only a little  ?Social Connections: Socially Integrated  ? Frequency of Communication with Friends and Family: More than three times a week  ? Frequency of Social Gatherings with Friends and Family: More than three times a week  ? Attends Religious Services: More than 4 times per year  ? Active Member of Clubs or Organizations: Yes  ? Attends Archivist Meetings: 1 to 4 times per year  ? Marital Status: Married  ? ? ?Allergies  ?Allergen Reactions  ? Aspirin Nausea And Vomiting  ? Norethindrone Shortness Of Breath and Rash  ? Oxycodone-Acetaminophen Rash and Shortness Of Breath  ? Lexapro [Escitalopram] Palpitations  ? Tape Other (See Comments)  ?  Burning feeling. Has left mark on skin.  ? Belviq [Lorcaserin Hcl] Other (See Comments)  ?  headache  ? Dicyclomine Nausea And Vomiting  ? Contrave [Naltrexone-Bupropion Hcl Er] Anxiety  ?  Altered mental state  ? ? ? ?Current Outpatient Medications:  ?  acetaminophen (TYLENOL) 325 MG tablet, Take 2 tablets (650 mg total) by mouth every 4 (four) hours as needed for mild pain or fever., Disp: , Rfl:  ?  albuterol (VENTOLIN HFA) 108 (90 Base) MCG/ACT inhaler, Inhale 2 puffs into the lungs every 6 (six) hours as needed for wheezing or shortness of breath., Disp: 18 g, Rfl: 0 ?  cefTRIAXone (ROCEPHIN) IVPB, Inject 1 g into the vein daily for 6 days. Indication:  disseminated gonococcal infection First Dose: Yes Last Day of Therapy: 11/15/2021 Method of administration: IV Push Method of  administration may be changed at the discretion of home infusion pharmacist based upon assessment of the patient and/or caregiver's ability to self-administer the medication ordered., Disp: 7 Units, Rfl: 0 ?  ferrous sulfate (FERROUSUL) 325 (65 FE) MG tablet, Take 1 tablet (325 mg total) by mouth 3 (three) times daily with meals., Disp: 90 tablet, Rfl: 1 ?  HYDROmorphone (DILAUDID) 2 MG tablet, Take 0.5 tablets (1 mg total) by mouth every 12 (twelve) hours as needed for severe pain., Disp: 10 tablet, Rfl: 0 ?  ibuprofen (ADVIL) 200 MG tablet, Take 1 tablet (200 mg total) by mouth every 6 (six) hours as needed for moderate pain., Disp: , Rfl:  ?  letrozole (FEMARA) 2.5 MG tablet, Take 2.5 mg by mouth as  directed., Disp: , Rfl:  ?  Multiple Vitamins-Minerals (MULTIVITAMIN ADULT) TABS, Take 1 tablet by mouth daily., Disp: 30 tablet, Rfl: 0 ?  apixaban (ELIQUIS) 5 MG TABS tablet, Take 1 tablet (5 mg total) by mouth 2 (two) times daily. (Patient not taking: Reported on 11/15/2021), Disp: 60 tablet, Rfl: 4 ?  leuprolide (LUPRON DEPOT, 75-MONTH,) 11.25 MG injection, Inject 11.25 mg into the muscle every 3 (three) months. (Patient not taking: Reported on 07/23/2021), Disp: 1 each, Rfl: 1 ? ?Current Facility-Administered Medications:  ?  cyanocobalamin ((VITAMIN B-12)) injection 1,000 mcg, 1,000 mcg, Intramuscular, Once, Steele Sizer, MD ? ? ?Review of Systems  ?Constitutional:  Negative for activity change, appetite change, chills, diaphoresis, fatigue, fever and unexpected weight change.  ?HENT:  Negative for congestion, rhinorrhea, sinus pressure, sneezing, sore throat and trouble swallowing.   ?Eyes:  Negative for photophobia and visual disturbance.  ?Respiratory:  Negative for cough, chest tightness, shortness of breath, wheezing and stridor.   ?Cardiovascular:  Negative for chest pain, palpitations and leg swelling.  ?Gastrointestinal:  Negative for abdominal distention, abdominal pain, anal bleeding, blood in stool,  constipation, diarrhea, nausea and vomiting.  ?Genitourinary:  Negative for difficulty urinating, dysuria, flank pain and hematuria.  ?Musculoskeletal:  Positive for joint swelling. Negative for arthralgias, back p

## 2021-12-15 ENCOUNTER — Ambulatory Visit (INDEPENDENT_AMBULATORY_CARE_PROVIDER_SITE_OTHER): Payer: Self-pay | Admitting: Infectious Disease

## 2021-12-15 ENCOUNTER — Encounter: Payer: Self-pay | Admitting: Hematology and Oncology

## 2021-12-15 ENCOUNTER — Other Ambulatory Visit: Payer: Self-pay

## 2021-12-15 ENCOUNTER — Other Ambulatory Visit (HOSPITAL_COMMUNITY)
Admission: RE | Admit: 2021-12-15 | Discharge: 2021-12-15 | Disposition: A | Discharge status: Home / Self Care | Payer: PRIVATE HEALTH INSURANCE | Source: Ambulatory Visit | Attending: Infectious Disease | Admitting: Infectious Disease

## 2021-12-15 VITALS — BP 127/84 | HR 67 | Resp 16 | Ht 65.0 in | Wt 197.0 lb

## 2021-12-15 DIAGNOSIS — Z113 Encounter for screening for infections with a predominantly sexual mode of transmission: Secondary | ICD-10-CM

## 2021-12-15 DIAGNOSIS — A5486 Gonococcal sepsis: Secondary | ICD-10-CM | POA: Insufficient documentation

## 2021-12-15 DIAGNOSIS — Z3689 Encounter for other specified antenatal screening: Secondary | ICD-10-CM

## 2021-12-15 DIAGNOSIS — Z114 Encounter for screening for human immunodeficiency virus [HIV]: Secondary | ICD-10-CM

## 2021-12-15 DIAGNOSIS — Z3200 Encounter for pregnancy test, result unknown: Secondary | ICD-10-CM

## 2021-12-15 DIAGNOSIS — Z7189 Other specified counseling: Secondary | ICD-10-CM

## 2021-12-15 NOTE — Progress Notes (Signed)
? ?Subjective:  ? ?Chief complaint: Still has some soreness in her left ankle from time to time and some stiffness in the right shoulder ? ? ? Patient ID: Sharon Herring, female    DOB: 1975-09-23, 46 y.o.   MRN: [id number] ? ?HPI ? ? 46 y.o. 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 ? ?Since I last saw her she ended up having an MRI of her shoulder due to continuing pain there.  MRI showed subacromial subdeltoid bursitis but no effusion or osseous changes there is also low-grade partial with interstitial tear of the junction of the supraspinatus and infraspinatus tendons. ? ?I saw her she was on ceftriaxone in the hospital and ultimately arrange for midline to be placed and she has been receiving ceftriaxone at home through today given at least 13 days of active therapy. ? ? ?As I last saw her she had completed IV antibiotics and PICC line has been pulled. ? ?She still has some residual soreness in her left ankle but has good range of motion same thing with her shoulder joint. ? ?She is interested in being retested for gonorrhea chlamydia and STIs as well as HIV. ? ?We did also discuss her HIV patient was a prophylaxis and I proposed her trying DESCOVY of which I gave her 28 days worth of samples to take home and try this and then follow-up with me in 1 month's time. ? ?Since she is uninsured we are able to prescribe DESCOVY for her "off label but if she gets insurance we would have to either use oral Truvada versus long-acting Apretude ? ? ? ? ?Past Medical History:  ?Diagnosis Date  ? Acute pulmonary embolism ([place])   ? Anemia   ? Dizziness   ? Fibroid uterus   ? GERD (gastroesophageal reflux disease)   ? History of blood transfusion 02/26/2021  ? History of thrombocytosis   ? History of uterine fibroid 03/14/2014  ? myomectomy  ? IBS (irritable bowel syndrome)   ? Iron deficiency anemia 03/14/2014  ? Migraine   ? Polyp of stomach 01/06/2016  ? Stomach polyp,  pyloric, consistent with prolapse.   ? Routine screening for STI (sexually transmitted infection) 11/15/2021  ? ? ?Past Surgical History:  ?Procedure Laterality Date  ? APPENDECTOMY  2012  ? CHOLECYSTECTOMY    ? CHROMOPERTUBATION N/A 02/18/2021  ? Procedure: CHROMOPERTUBATION;  Surgeon: Rubie Maid, MD;  Location: [facility] ORS;  Service: Gynecology;  Laterality: N/A;  ? ESOPHAGOGASTRODUODENOSCOPY (EGD) WITH PROPOFOL N/A 01/06/2016  ? Procedure: ESOPHAGOGASTRODUODENOSCOPY (EGD) WITH PROPOFOL;  Surgeon: Robert Bellow, MD;  Location: [facility] ENDOSCOPY;  Service: Endoscopy;  Laterality: N/A;  ? HERNIA REPAIR    ? IR FLUORO GUIDED NEEDLE PLC ASPIRATION/INJECTION LOC  11/02/2021  ? ROBOT ASSISTED MYOMECTOMY N/A 02/18/2021  ? Procedure: XI ROBOTIC ASSISTED MYOMECTOMY;  Surgeon: Rubie Maid, MD;  Location: [facility] ORS;  Service: Gynecology;  Laterality: N/A;  ? ROBOTIC ASSISTED LAPAROSCOPIC CHOLECYSTECTOMY N/A 03/03/2016  ? Procedure: ROBOTIC ASSISTED LAPAROSCOPIC CHOLECYSTECTOMY ;  Surgeon: Clayburn Pert, MD;  Location: [facility] ORS;  Service: General;  Laterality: N/A;  ? UTERINE FIBROID SURGERY  2012  ? XI ROBOTIC ASSISTED VENTRAL HERNIA N/A 07/15/2019  ? Procedure: XI ROBOTIC ASSISTED VENTRAL HERNIA;  Surgeon: Jules Husbands, MD;  Location: [facility] ORS;  Service: General;  Laterality: N/A;  ? ? ?Family History  ?Problem Relation Age of Onset  ? Hypertension Mother   ? Hypertension Father   ? Pancreatic  cancer Father 66  ? Breast cancer Neg Hx   ? Colon cancer Neg Hx   ? ? ?  ?Social History  ? ?Socioeconomic History  ? Marital status: Married  ?  Spouse name: Not on file  ? Number of children: 0  ? Years of education: Not on file  ? Highest education level: Bachelor's degree (e.g., BA, AB, BS)  ?Occupational History  ? Occupation: CMA  ?Tobacco Use  ? Smoking status: Never  ? Smokeless tobacco: Never  ?Vaping Use  ? Vaping Use: Never used  ?Substance and Sexual Activity  ? Alcohol use: No  ?  Alcohol/week: 0.0 standard drinks  ?  Drug use: No  ? Sexual activity: Not Currently  ?  Partners: Male  ?  Birth control/protection: None  ?Other Topics Concern  ? Not on file  ?Social History Narrative  ? Lives alone, works at [employer]   ? ?Social Determinants of Health  ? ?Financial Resource Strain: Low Risk   ? Difficulty of Paying Living Expenses: Not hard at all  ?Food Insecurity: No Food Insecurity  ? Worried About Charity fundraiser in the Last Year: Never true  ? Ran Out of Food in the Last Year: Never true  ?Transportation Needs: No Transportation Needs  ? Lack of Transportation (Medical): No  ? Lack of Transportation (Non-Medical): No  ?Physical Activity: Insufficiently Active  ? Days of Exercise per Week: 2 days  ? Minutes of Exercise per Session: 30 min  ?Stress: No Stress Concern Present  ? Feeling of Stress : Only a little  ?Social Connections: Socially Integrated  ? Frequency of Communication with Friends and Family: More than three times a week  ? Frequency of Social Gatherings with Friends and Family: More than three times a week  ? Attends Religious Services: More than 4 times per year  ? Active Member of Clubs or Organizations: Yes  ? Attends Archivist Meetings: 1 to 4 times per year  ? Marital Status: Married  ? ? ?Allergies  ?Allergen Reactions  ? Aspirin Nausea And Vomiting  ? Norethindrone Shortness Of Breath and Rash  ? Oxycodone-Acetaminophen Rash and Shortness Of Breath  ? Lexapro [Escitalopram] Palpitations  ? Tape Other (See Comments)  ?  Burning feeling. Has left mark on skin.  ? Belviq [Lorcaserin Hcl] Other (See Comments)  ?  headache  ? Dicyclomine Nausea And Vomiting  ? Contrave [Naltrexone-Bupropion Hcl Er] Anxiety  ?  Altered mental state  ? ? ? ?Current Outpatient Medications:  ?  acetaminophen (TYLENOL) 325 MG tablet, Take 2 tablets (650 mg total) by mouth every 4 (four) hours as needed for mild pain or fever., Disp: , Rfl:  ?  albuterol (VENTOLIN HFA) 108 (90 Base) MCG/ACT inhaler, Inhale 2 puffs  into the lungs every 6 (six) hours as needed for wheezing or shortness of breath., Disp: 18 g, Rfl: 0 ?  apixaban (ELIQUIS) 5 MG TABS tablet, Take 1 tablet (5 mg total) by mouth 2 (two) times daily., Disp: 60 tablet, Rfl: 4 ?  ferrous sulfate (FERROUSUL) 325 (65 FE) MG tablet, Take 1 tablet (325 mg total) by mouth 3 (three) times daily with meals., Disp: 90 tablet, Rfl: 1 ?  HYDROmorphone (DILAUDID) 2 MG tablet, Take 0.5 tablets (1 mg total) by mouth every 12 (twelve) hours as needed for severe pain., Disp: 10 tablet, Rfl: 0 ?  ibuprofen (ADVIL) 200 MG tablet, Take 1 tablet (200 mg total) by mouth every 6 (six)  hours as needed for moderate pain., Disp: , Rfl:  ?  letrozole (FEMARA) 2.5 MG tablet, Take 2.5 mg by mouth as directed., Disp: , Rfl:  ?  leuprolide (LUPRON DEPOT, 71-MONTH,) 11.25 MG injection, Inject 11.25 mg into the muscle every 3 (three) months., Disp: 1 each, Rfl: 1 ?  Multiple Vitamins-Minerals (MULTIVITAMIN ADULT) TABS, Take 1 tablet by mouth daily., Disp: 30 tablet, Rfl: 0 ? ?Current Facility-Administered Medications:  ?  cyanocobalamin ((VITAMIN B-12)) injection 1,000 mcg, 1,000 mcg, Intramuscular, Once, Steele Sizer, MD ? ? ?Review of Systems  ?Constitutional:  Negative for activity change, appetite change, chills, diaphoresis, fatigue, fever and unexpected weight change.  ?HENT:  Negative for congestion, rhinorrhea, sinus pressure, sneezing, sore throat and trouble swallowing.   ?Eyes:  Negative for photophobia and visual disturbance.  ?Respiratory:  Negative for cough, chest tightness, shortness of breath, wheezing and stridor.   ?Cardiovascular:  Negative for chest pain, palpitations and leg swelling.  ?Gastrointestinal:  Negative for abdominal distention, abdominal pain, anal bleeding, blood in stool, constipation, diarrhea, nausea and vomiting.  ?Genitourinary:  Negative for difficulty urinating, dysuria, flank pain and hematuria.  ?Musculoskeletal:  Positive for arthralgias. Negative for  back pain, gait problem, joint swelling and myalgias.  ?Skin:  Negative for color change, pallor, rash and wound.  ?Neurological:  Negative for dizziness, tremors, weakness and light-headedness.  ?Hematol

## 2021-12-16 ENCOUNTER — Encounter: Payer: Self-pay | Admitting: Hematology and Oncology

## 2021-12-16 LAB — COMPLETE METABOLIC PANEL WITHOUT GFR
AG Ratio: 1.2 (calc) (ref 1.0–2.5)
ALT: 8 U/L (ref 6–29)
AST: 9 U/L — ABNORMAL LOW (ref 10–30)
Albumin: 3.9 g/dL (ref 3.6–5.1)
Alkaline phosphatase (APISO): 73 U/L (ref 31–125)
BUN: 9 mg/dL (ref 7–25)
CO2: 28 mmol/L (ref 20–32)
Calcium: 9.5 mg/dL (ref 8.6–10.2)
Chloride: 104 mmol/L (ref 98–110)
Creat: 0.73 mg/dL (ref 0.50–0.99)
Globulin: 3.3 g/dL (ref 1.9–3.7)
Glucose, Bld: 100 mg/dL — ABNORMAL HIGH (ref 65–99)
Potassium: 4.3 mmol/L (ref 3.5–5.3)
Sodium: 138 mmol/L (ref 135–146)
Total Bilirubin: 0.2 mg/dL (ref 0.2–1.2)
Total Protein: 7.2 g/dL (ref 6.1–8.1)
eGFR: 104 mL/min/1.73m2

## 2021-12-16 LAB — CYTOLOGY, (ORAL, ANAL, URETHRAL) ANCILLARY ONLY
Chlamydia: NEGATIVE
Comment: NEGATIVE
Comment: NORMAL
Neisseria Gonorrhea: NEGATIVE

## 2021-12-16 LAB — URINE CYTOLOGY ANCILLARY ONLY
Chlamydia: NEGATIVE
Comment: NEGATIVE
Comment: NEGATIVE
Comment: NORMAL
Neisseria Gonorrhea: NEGATIVE
Trichomonas: NEGATIVE

## 2021-12-16 LAB — SYPHILIS: RPR W/REFLEX TO RPR TITER AND TREPONEMAL ANTIBODIES, TRADITIONAL SCREENING AND DIAGNOSIS ALGORITHM: RPR Ser Ql: NONREACTIVE

## 2021-12-16 LAB — HCG, QUANTITATIVE, PREGNANCY: HCG, Total, QN: <5 m[IU]/mL

## 2021-12-16 LAB — HIV ANTIBODY (ROUTINE TESTING W REFLEX): HIV 1&2 Ab, 4th Generation: NONREACTIVE

## 2021-12-23 ENCOUNTER — Other Ambulatory Visit: Payer: Self-pay | Admitting: Pharmacist

## 2021-12-23 DIAGNOSIS — Z114 Encounter for screening for human immunodeficiency virus [HIV]: Secondary | ICD-10-CM

## 2021-12-23 MED ORDER — DESCOVY 200-25 MG PO TABS: 1.0000 | ORAL_TABLET | Freq: Every day | ORAL | 0 refills | Status: AC

## 2021-12-23 NOTE — Progress Notes (Signed)
Medication Samples have been provided to the patient per Dr. Tommy Medal. ? ?Drug name: Descovy        ?Strength: 200/25 mg       ?Qty: 28 tablets (4 packs)   ?LOT: [id number]    ?Exp.Date: 04/2023 ? ?Dosing instructions: Take one tablet by mouth once daily ? ?The patient has been instructed regarding the correct time, dose, and frequency of taking this medication, including desired effects and most common side effects.  ? ?Dalan Cowger L. Abdelrahman Nair, PharmD, BCIDP, AAHIVP, CPP ?Clinical Pharmacist Practitioner ?Infectious Diseases Clinical Pharmacist ?[facility] ?08/03/2020, 10:07 AM  ?

## 2022-01-04 ENCOUNTER — Other Ambulatory Visit: Payer: Self-pay | Admitting: Internal Medicine

## 2022-01-12 ENCOUNTER — Other Ambulatory Visit (HOSPITAL_COMMUNITY): Payer: Self-pay

## 2022-01-12 ENCOUNTER — Ambulatory Visit: Payer: PRIVATE HEALTH INSURANCE | Admitting: Infectious Disease

## 2022-04-13 NOTE — Progress Notes (Deleted)
Name: Sharon Herring   MRN: [id number]    DOB: [birth year]   Date:04/13/2022       Progress Note  Subjective  Chief Complaint  Annual Exam  HPI  Patient presents for annual CPE.  Diet: *** Exercise: ***  Last Eye Exam: *** Last Dental Exam: ***  Flowsheet Row Video Visit from 08/25/2020 in [facility]  AUDIT-C Score 0      Depression: Phq 9 is  {Desc; negative/positive:13464}    12/15/2021    8:45 AM 11/15/2021   10:29 AM 04/15/2021   10:53 AM 03/02/2021    2:44 PM 08/25/2020   12:04 PM  Depression screen PHQ 2/9  Decreased Interest 0 0 0 0 0  Down, Depressed, Hopeless 0 0 0 0 0  PHQ - 2 Score 0 0 0 0 0  Altered sleeping   0    Tired, decreased energy   0    Change in appetite   0    Feeling bad or failure about yourself    0    Trouble concentrating   0    Moving slowly or fidgety/restless   0    Suicidal thoughts   0    PHQ-9 Score   0     Hypertension: BP Readings from Last 3 Encounters:  12/15/21 127/84  11/15/21 128/82  11/08/21 116/84   Obesity: Wt Readings from Last 3 Encounters:  12/15/21 197 lb (89.4 kg)  11/15/21 193 lb (87.5 kg)  11/02/21 197 lb 12 oz (89.7 kg)   BMI Readings from Last 3 Encounters:  12/15/21 32.78 kg/m  11/15/21 32.12 kg/m  11/02/21 32.91 kg/m     Vaccines:   HPV: N/A Tdap: up to date Shingrix: N/A Pneumonia: N/A Flu: due COVID-19: up to date   Hep C Screening: 11/03/21 STD testing and prevention (HIV/chl/gon/syphilis): 12/15/21 Intimate partner violence: negative screen  Sexual History : Menstrual History/LMP/Abnormal Bleeding:  Discussed importance of follow up if any post-menopausal bleeding: {Response; yes/no/na:63}  Incontinence Symptoms: negative for symptoms   Breast cancer:  - Last Mammogram: Ordered 04/15/21 - BRCA gene screening: N/A  Osteoporosis Prevention : Discussed high calcium and vitamin D supplementation, weight bearing exercises Bone density: N/A   Cervical cancer  screening: ordered today  Skin cancer: Discussed monitoring for atypical lesions  Colorectal cancer: N/A   Lung cancer:  Low Dose CT Chest recommended if Age 23-80 years, 20 pack-year currently smoking OR have quit w/in 15years. Patient does not qualify for screen   ECG: 11/02/21  Advanced Care Planning: A voluntary discussion about advance care planning including the explanation and discussion of advance directives.  Discussed health care proxy and Living will, and the patient was able to identify a health care proxy as ***.  Patient does not have a living will and power of attorney of health care   Lipids: Lab Results  Component Value Date   CHOL 168 04/15/2021   CHOL 171 09/25/2019   CHOL 171 01/22/2019   Lab Results  Component Value Date   HDL 41 (L) 04/15/2021   HDL 41 09/25/2019   HDL 35 (L) 01/22/2019   Lab Results  Component Value Date   LDLCALC 113 (H) 04/15/2021   LDLCALC 114 (H) 09/25/2019   LDLCALC 114 (H) 01/22/2019   Lab Results  Component Value Date   TRIG 62 04/15/2021   TRIG 86 09/25/2019   TRIG 108 01/22/2019   Lab Results  Component Value Date   CHOLHDL 4.1 04/15/2021  CHOLHDL 4.2 09/25/2019   CHOLHDL 4.9 01/22/2019   No results found for: "LDLDIRECT"  Glucose: Glucose, Bld  Date Value Ref Range Status  12/15/2021 100 (H) 65 - 99 mg/dL Final    Comment:    .            Fasting reference interval . For someone without known diabetes, a glucose value between 100 and 125 mg/dL is consistent with prediabetes and should be confirmed with a follow-up test. .   11/08/2021 112 (H) 70 - 99 mg/dL Final    Comment:    Glucose reference range applies only to samples taken after fasting for at least 8 hours.  11/06/2021 114 (H) 70 - 99 mg/dL Final    Comment:    Glucose reference range applies only to samples taken after fasting for at least 8 hours.    Patient Active Problem List   Diagnosis Date Noted   Routine screening for STI (sexually  transmitted infection) 11/15/2021   DGI (disseminated gonococcal infection) ([place]) 11/03/2021   Left ankle pain 11/02/2021   Partial tear of right rotator cuff 11/02/2021   History of pulmonary embolism 11/02/2021   History of blood transfusion 02/26/2021   Acute pulmonary embolism ([place])    S/P myomectomy 02/19/2021   Anemia associated with acute blood loss 02/17/2021   Symptomatic anemia 10/26/2020   Lightheadedness 04/15/2020   Liver lesion 01/15/2020   Hiatal hernia 01/15/2020   Lymphadenopathy 01/16/2018   Moderate obstructive sleep apnea 12/19/2017   Swelling of limb 01/27/2017   B12 deficiency 03/28/2016   Vitamin D deficiency 03/28/2016   Hyperglycemia 03/28/2016   Multiple gastric polyps 01/08/2016   Menorrhagia 11/13/2015   Chronic constipation 09/23/2015   Obesity, Class I, BMI 30.0-34.9 (see actual BMI) 06/02/2015   Fever 05/12/2015   Iron deficiency anemia 03/14/2014   History of uterine fibroid 03/14/2014    Past Surgical History:  Procedure Laterality Date   APPENDECTOMY  2012   CHOLECYSTECTOMY     CHROMOPERTUBATION N/A 02/18/2021   Procedure: CHROMOPERTUBATION;  Surgeon: Rubie Maid, MD;  Location: [facility];  Service: Gynecology;  Laterality: N/A;   ESOPHAGOGASTRODUODENOSCOPY (EGD) WITH PROPOFOL N/A 01/06/2016   Procedure: ESOPHAGOGASTRODUODENOSCOPY (EGD) WITH PROPOFOL;  Surgeon: Robert Bellow, MD;  Location: [facility];  Service: Endoscopy;  Laterality: N/A;   HERNIA REPAIR     IR FLUORO GUIDED NEEDLE PLC ASPIRATION/INJECTION LOC  11/02/2021   ROBOT ASSISTED MYOMECTOMY N/A 02/18/2021   Procedure: XI ROBOTIC ASSISTED MYOMECTOMY;  Surgeon: Rubie Maid, MD;  Location: [facility];  Service: Gynecology;  Laterality: N/A;   ROBOTIC ASSISTED LAPAROSCOPIC CHOLECYSTECTOMY N/A 03/03/2016   Procedure: ROBOTIC ASSISTED LAPAROSCOPIC CHOLECYSTECTOMY ;  Surgeon: Clayburn Pert, MD;  Location: [facility];  Service: General;  Laterality: N/A;   UTERINE FIBROID SURGERY   2012   XI ROBOTIC ASSISTED VENTRAL HERNIA N/A 07/15/2019   Procedure: XI ROBOTIC ASSISTED VENTRAL HERNIA;  Surgeon: Jules Husbands, MD;  Location: [facility];  Service: General;  Laterality: N/A;    Family History  Problem Relation Age of Onset   Hypertension Mother    Hypertension Father    Pancreatic cancer Father 11   Breast cancer Neg Hx    Colon cancer Neg Hx     Social History   Socioeconomic History   Marital status: Married    Spouse name: Not on file   Number of children: 0   Years of education: Not on file   Highest education level: Bachelor's degree (e.g., BA,  AB, BS)  Occupational History   Occupation: CMA  Tobacco Use   Smoking status: Never   Smokeless tobacco: Never  Vaping Use   Vaping Use: Never used  Substance and Sexual Activity   Alcohol use: No    Alcohol/week: 0.0 standard drinks of alcohol   Drug use: No   Sexual activity: Not Currently    Partners: Male    Birth control/protection: None  Other Topics Concern   Not on file  Social History Narrative   Lives alone, works at [employer] Strain: [place]  (04/15/2021)   Overall Financial Resource Strain (CARDIA)    Difficulty of Paying Living Expenses: Not hard at all  Food Insecurity: No Food Insecurity (04/15/2021)   Hunger Vital Sign    Worried About Running Out of Food in the Last Year: Never true    [place] in the Last Year: Never true  Transportation Needs: No Transportation Needs (04/15/2021)   PRAPARE - Hydrologist (Medical): No    Lack of Transportation (Non-Medical): No  Physical Activity: Insufficiently Active (04/15/2021)   Exercise Vital Sign    Days of Exercise per Week: 2 days    Minutes of Exercise per Session: 30 min  Stress: No Stress Concern Present (04/15/2021)   [place]    Feeling of Stress : Only a little  Social  Connections: Socially Integrated (04/15/2021)   Social Connection and Isolation Panel [NHANES]    Frequency of Communication with Friends and Family: More than three times a week    Frequency of Social Gatherings with Friends and Family: More than three times a week    Attends Religious Services: More than 4 times per year    Active Member of [employer] or Organizations: Yes    Attends Archivist Meetings: 1 to 4 times per year    Marital Status: Married  Human resources officer Violence: Not At Risk (04/15/2021)   Humiliation, Afraid, Rape, and Kick questionnaire    Fear of Current or Ex-Partner: No    Emotionally Abused: No    Physically Abused: No    Sexually Abused: No     Current Outpatient Medications:    acetaminophen (TYLENOL) 325 MG tablet, Take 2 tablets (650 mg total) by mouth every 4 (four) hours as needed for mild pain or fever., Disp: , Rfl:    albuterol (VENTOLIN HFA) 108 (90 Base) MCG/ACT inhaler, Inhale 2 puffs into the lungs every 6 (six) hours as needed for wheezing or shortness of breath., Disp: 18 g, Rfl: 0   apixaban (ELIQUIS) 5 MG TABS tablet, Take 1 tablet (5 mg total) by mouth 2 (two) times daily., Disp: 60 tablet, Rfl: 4   ferrous sulfate (FERROUSUL) 325 (65 FE) MG tablet, Take 1 tablet (325 mg total) by mouth 3 (three) times daily with meals., Disp: 90 tablet, Rfl: 1   HYDROmorphone (DILAUDID) 2 MG tablet, Take 0.5 tablets (1 mg total) by mouth every 12 (twelve) hours as needed for severe pain., Disp: 10 tablet, Rfl: 0   ibuprofen (ADVIL) 200 MG tablet, Take 1 tablet (200 mg total) by mouth every 6 (six) hours as needed for moderate pain., Disp: , Rfl:    letrozole (FEMARA) 2.5 MG tablet, Take 2.5 mg by mouth as directed., Disp: , Rfl:    leuprolide (LUPRON DEPOT, 95-MONTH,) 11.25 MG injection, Inject 11.25 mg  into the muscle every 3 (three) months., Disp: 1 each, Rfl: 1   Multiple Vitamins-Minerals (MULTIVITAMIN ADULT) TABS, Take 1 tablet by mouth daily., Disp: 30  tablet, Rfl: 0  Current Facility-Administered Medications:    cyanocobalamin ((VITAMIN B-12)) injection 1,000 mcg, 1,000 mcg, Intramuscular, Once, Sowles, Drue Stager, MD  Allergies  Allergen Reactions   Aspirin Nausea And Vomiting   Norethindrone Shortness Of Breath and Rash   Oxycodone-Acetaminophen Rash and Shortness Of Breath   Lexapro [Escitalopram] Palpitations   Tape Other (See Comments)    Burning feeling. Has left mark on skin.   Belviq [Lorcaserin Hcl] Other (See Comments)    headache   Dicyclomine Nausea And Vomiting   Contrave [Naltrexone-Bupropion Hcl Er] Anxiety    Altered mental state     ROS  ***  Objective  There were no vitals filed for this visit.  There is no height or weight on file to calculate BMI.  Physical Exam ***  No results found for this or any previous visit (from the past 2160 hour(s)).   Fall Risk:    12/15/2021    8:45 AM 11/15/2021   10:29 AM 04/15/2021   10:53 AM 03/02/2021    2:44 PM 08/25/2020   12:04 PM  Fall Risk   Falls in the past year? 0 0 0 0 0  Number falls in past yr: 0  0 0 0  Injury with Fall? 0  0 0 0  Risk for fall due to :  No Fall Risks No Fall Risks    Follow up  Falls evaluation completed Falls prevention discussed       Functional Status Survey:     Assessment & Plan  1. Well adult exam ***   -USPSTF grade A and B recommendations reviewed with patient; age-appropriate recommendations, preventive care, screening tests, etc discussed and encouraged; healthy living encouraged; see AVS for patient education given to patient -Discussed importance of 150 minutes of physical activity weekly, eat two servings of fish weekly, eat one serving of tree nuts ( cashews, pistachios, pecans, almonds.Marland Kitchen) every other day, eat 6 servings of fruit/vegetables daily and drink plenty of water and avoid sweet beverages.   -Reviewed Health Maintenance: Yes.

## 2022-04-14 ENCOUNTER — Encounter: Payer: PRIVATE HEALTH INSURANCE | Admitting: Family Medicine

## 2022-05-24 ENCOUNTER — Other Ambulatory Visit
Admission: RE | Admit: 2022-05-24 | Discharge: 2022-05-24 | Disposition: A | Discharge status: Home / Self Care | Payer: Managed Care, Other (non HMO) | Source: Ambulatory Visit | Attending: Family Medicine | Admitting: Family Medicine

## 2022-05-24 ENCOUNTER — Encounter: Payer: Self-pay | Admitting: Hematology and Oncology

## 2022-05-24 DIAGNOSIS — R0602 Shortness of breath: Secondary | ICD-10-CM | POA: Diagnosis present

## 2022-05-24 DIAGNOSIS — R079 Chest pain, unspecified: Secondary | ICD-10-CM | POA: Diagnosis present

## 2022-05-24 LAB — D-DIMER, QUANTITATIVE: D-Dimer, Quant: 0.34 ug/mL-FEU (ref 0.00–0.50)

## 2022-06-08 ENCOUNTER — Encounter: Payer: Self-pay | Admitting: Oncology

## 2022-06-08 ENCOUNTER — Inpatient Hospital Stay: Payer: Managed Care, Other (non HMO) | Attending: Oncology | Admitting: Oncology

## 2022-06-08 ENCOUNTER — Inpatient Hospital Stay: Payer: Managed Care, Other (non HMO)

## 2022-06-08 VITALS — BP 135/94 | HR 70 | Temp 98.2°F | Resp 18 | Wt 208.6 lb

## 2022-06-08 DIAGNOSIS — Z86711 Personal history of pulmonary embolism: Secondary | ICD-10-CM | POA: Insufficient documentation

## 2022-06-08 DIAGNOSIS — Z7901 Long term (current) use of anticoagulants: Secondary | ICD-10-CM | POA: Insufficient documentation

## 2022-06-08 DIAGNOSIS — D509 Iron deficiency anemia, unspecified: Secondary | ICD-10-CM | POA: Insufficient documentation

## 2022-06-08 DIAGNOSIS — Z800 Family history of malignant neoplasm of digestive organs: Secondary | ICD-10-CM | POA: Insufficient documentation

## 2022-06-08 DIAGNOSIS — Z86018 Personal history of other benign neoplasm: Secondary | ICD-10-CM | POA: Diagnosis not present

## 2022-06-08 NOTE — Progress Notes (Signed)
Hematology/Oncology Consult note [facility] Telephone:([phone] Fax:([phone]) [phone]  Patient Care Team: Steele Sizer, MD as PCP - General (Family Medicine) Steele Sizer, MD as Attending Physician (Family Medicine) Bary Castilla Forest Gleason, MD (General Surgery)   Name of the patient: Sharon Herring  [id number]  1976/08/17    Reason for referral-iron deficiency anemia   Referring physician-Dr. Ancil Boozer  Date of visit: 06/08/22   History of presenting illness- Patient is a 46 year old female referred for iron deficiency anemia.  Her most recent CBC from 05/24/2022 showed an H&H of 9/31.3 with an MCV of 70.  Platelet count was elevated at 550 with a normal white count of 5.2.  Iron studies showed an elevated TIBC of 517.  Ferritin levels were low at 6. Patient has had an EGD by Dr. Bary Castilla back in May 2017 which was normal but showed 2 gastric polyps.  These were negative for dysplasia and malignancy.  Patient has not had any colonoscopy yet.  Patient had surgery for uterine fibroids last year.  Since then she reports that her menstrual cycles have been much lighter and they continue for about 5 days.  She has had chronic anemia over the last 1 year with a hemoglobin that has been as low as 7 in the past.  Denies any blood loss in her stool or urine.  Denies any dark melanotic stools.  Denies any family history of colon cancer.  Denies any consistent use of NSAIDs Goody powder or BC powder.  She was seen by [facility] yesterday and will be scheduled for EGD and colonoscopy.  ECOG PS- 0  Pain scale- 0   Review of systems- Review of Systems  Constitutional:  Positive for malaise/fatigue. Negative for chills, fever and weight loss.  HENT:  Negative for congestion, ear discharge and nosebleeds.   Eyes:  Negative for blurred vision.  Respiratory:  Negative for cough, hemoptysis, sputum production, shortness of breath and wheezing.   Cardiovascular:  Negative for chest  pain, palpitations, orthopnea and claudication.  Gastrointestinal:  Negative for abdominal pain, blood in stool, constipation, diarrhea, heartburn, melena, nausea and vomiting.  Genitourinary:  Negative for dysuria, flank pain, frequency, hematuria and urgency.  Musculoskeletal:  Negative for back pain, joint pain and myalgias.  Skin:  Negative for rash.  Neurological:  Negative for dizziness, tingling, focal weakness, seizures, weakness and headaches.  Endo/Heme/Allergies:  Does not bruise/bleed easily.  Psychiatric/Behavioral:  Negative for depression and suicidal ideas. The patient does not have insomnia.     Allergies  Allergen Reactions   Aspirin Nausea And Vomiting   Norethindrone Shortness Of Breath and Rash   Oxycodone-Acetaminophen Rash and Shortness Of Breath   Lexapro [Escitalopram] Palpitations   Tape Other (See Comments)    Burning feeling. Has left mark on skin.   Belviq [Lorcaserin Hcl] Other (See Comments)    headache   Dicyclomine Nausea And Vomiting   Contrave [Naltrexone-Bupropion Hcl Er] Anxiety    Altered mental state    Patient Active Problem List   Diagnosis Date Noted   Routine screening for STI (sexually transmitted infection) 11/15/2021   DGI (disseminated gonococcal infection) ([place]) 11/03/2021   Left ankle pain 11/02/2021   Partial tear of right rotator cuff 11/02/2021   History of pulmonary embolism 11/02/2021   History of blood transfusion 02/26/2021   Acute pulmonary embolism ([place])    S/P myomectomy 02/19/2021   Anemia associated with acute blood loss 02/17/2021   Symptomatic anemia 10/26/2020   Lightheadedness 04/15/2020  Liver lesion 01/15/2020   Hiatal hernia 01/15/2020   Lymphadenopathy 01/16/2018   Moderate obstructive sleep apnea 12/19/2017   Swelling of limb 01/27/2017   B12 deficiency 03/28/2016   Vitamin D deficiency 03/28/2016   Hyperglycemia 03/28/2016   Multiple gastric polyps 01/08/2016   Menorrhagia 11/13/2015   Chronic  constipation 09/23/2015   Obesity, Class I, BMI 30.0-34.9 (see actual BMI) 06/02/2015   Fever 05/12/2015   Iron deficiency anemia 03/14/2014   History of uterine fibroid 03/14/2014     Past Medical History:  Diagnosis Date   Acute pulmonary embolism (HCC)    Anemia    Dizziness    Fibroid uterus    GERD (gastroesophageal reflux disease)    History of blood transfusion 02/26/2021   History of thrombocytosis    History of uterine fibroid 03/14/2014   myomectomy   IBS (irritable bowel syndrome)    Iron deficiency anemia 03/14/2014   Migraine    Polyp of stomach 01/06/2016   Stomach polyp, pyloric, consistent with prolapse.    Routine screening for STI (sexually transmitted infection) 11/15/2021     Past Surgical History:  Procedure Laterality Date   APPENDECTOMY  2012   CHOLECYSTECTOMY     CHROMOPERTUBATION N/A 02/18/2021   Procedure: CHROMOPERTUBATION;  Surgeon: Rubie Maid, MD;  Location: [facility];  Service: Gynecology;  Laterality: N/A;   ESOPHAGOGASTRODUODENOSCOPY (EGD) WITH PROPOFOL N/A 01/06/2016   Procedure: ESOPHAGOGASTRODUODENOSCOPY (EGD) WITH PROPOFOL;  Surgeon: Robert Bellow, MD;  Location: [facility];  Service: Endoscopy;  Laterality: N/A;   HERNIA REPAIR     IR FLUORO GUIDED NEEDLE PLC ASPIRATION/INJECTION LOC  11/02/2021   ROBOT ASSISTED MYOMECTOMY N/A 02/18/2021   Procedure: XI ROBOTIC ASSISTED MYOMECTOMY;  Surgeon: Rubie Maid, MD;  Location: [facility];  Service: Gynecology;  Laterality: N/A;   ROBOTIC ASSISTED LAPAROSCOPIC CHOLECYSTECTOMY N/A 03/03/2016   Procedure: ROBOTIC ASSISTED LAPAROSCOPIC CHOLECYSTECTOMY ;  Surgeon: Clayburn Pert, MD;  Location: [facility];  Service: General;  Laterality: N/A;   UTERINE FIBROID SURGERY  2012   XI ROBOTIC ASSISTED VENTRAL HERNIA N/A 07/15/2019   Procedure: XI ROBOTIC ASSISTED VENTRAL HERNIA;  Surgeon: Jules Husbands, MD;  Location: [facility];  Service: General;  Laterality: N/A;    Social History   Socioeconomic  History   Marital status: Single    Spouse name: Not on file   Number of children: 0   Years of education: Not on file   Highest education level: Bachelor's degree (e.g., BA, AB, BS)  Occupational History   Occupation: CMA  Tobacco Use   Smoking status: Never   Smokeless tobacco: Never  Vaping Use   Vaping Use: Never used  Substance and Sexual Activity   Alcohol use: No    Alcohol/week: 0.0 standard drinks of alcohol   Drug use: No   Sexual activity: Not Currently    Partners: Male    Birth control/protection: None  Other Topics Concern   Not on file  Social History Narrative   Lives alone, works at [employer] Strain: [place]  (04/15/2021)   Overall Financial Resource Strain (CARDIA)    Difficulty of Paying Living Expenses: Not hard at all  Food Insecurity: No Food Insecurity (04/15/2021)   Hunger Vital Sign    Worried About Running Out of Food in the Last Year: Never true    [place] in the Last Year: Never true  Transportation Needs: No Transportation Needs (04/15/2021)  PRAPARE - Hydrologist (Medical): No    Lack of Transportation (Non-Medical): No  Physical Activity: Insufficiently Active (04/15/2021)   Exercise Vital Sign    Days of Exercise per Week: 2 days    Minutes of Exercise per Session: 30 min  Stress: No Stress Concern Present (04/15/2021)   [place]    Feeling of Stress : Only a little  Social Connections: Socially Integrated (04/15/2021)   Social Connection and Isolation Panel [NHANES]    Frequency of Communication with Friends and Family: More than three times a week    Frequency of Social Gatherings with Friends and Family: More than three times a week    Attends Religious Services: More than 4 times per year    Active Member of [employer] or Organizations: Yes    Attends Archivist Meetings:  1 to 4 times per year    Marital Status: Married  Human resources officer Violence: Not At Risk (04/15/2021)   Humiliation, Afraid, Rape, and Kick questionnaire    Fear of Current or Ex-Partner: No    Emotionally Abused: No    Physically Abused: No    Sexually Abused: No     Family History  Problem Relation Age of Onset   Hypertension Mother    Hypertension Father    Pancreatic cancer Father 31   Breast cancer Neg Hx    Colon cancer Neg Hx      Current Outpatient Medications:    iron polysaccharides (NIFEREX) 150 MG capsule, Take by mouth., Disp: , Rfl:    Multiple Vitamins-Minerals (MULTIVITAMIN ADULT) TABS, Take 1 tablet by mouth daily., Disp: 30 tablet, Rfl: 0   acetaminophen (TYLENOL) 325 MG tablet, Take 2 tablets (650 mg total) by mouth every 4 (four) hours as needed for mild pain or fever. (Patient not taking: Reported on 06/08/2022), Disp: , Rfl:    albuterol (VENTOLIN HFA) 108 (90 Base) MCG/ACT inhaler, Inhale 2 puffs into the lungs every 6 (six) hours as needed for wheezing or shortness of breath. (Patient not taking: Reported on 06/08/2022), Disp: 18 g, Rfl: 0   apixaban (ELIQUIS) 5 MG TABS tablet, Take 1 tablet (5 mg total) by mouth 2 (two) times daily. (Patient not taking: Reported on 06/08/2022), Disp: 60 tablet, Rfl: 4   ferrous sulfate (FERROUSUL) 325 (65 FE) MG tablet, Take 1 tablet (325 mg total) by mouth 3 (three) times daily with meals. (Patient not taking: Reported on 06/08/2022), Disp: 90 tablet, Rfl: 1   HYDROmorphone (DILAUDID) 2 MG tablet, Take 0.5 tablets (1 mg total) by mouth every 12 (twelve) hours as needed for severe pain., Disp: 10 tablet, Rfl: 0   ibuprofen (ADVIL) 200 MG tablet, Take 1 tablet (200 mg total) by mouth every 6 (six) hours as needed for moderate pain., Disp: , Rfl:    letrozole (FEMARA) 2.5 MG tablet, Take 2.5 mg by mouth as directed. (Patient not taking: Reported on 06/08/2022), Disp: , Rfl:    leuprolide (LUPRON DEPOT, 35-MONTH,) 11.25 MG injection,  Inject 11.25 mg into the muscle every 3 (three) months. (Patient not taking: Reported on 06/08/2022), Disp: 1 each, Rfl: 1  Current Facility-Administered Medications:    cyanocobalamin ((VITAMIN B-12)) injection 1,000 mcg, 1,000 mcg, Intramuscular, Once, Steele Sizer, MD   Physical exam:  Vitals:   06/08/22 1324  Resp: 18  Temp: 98.2 F (36.8 C)  Weight: 208 lb 9.6 oz (94.6 kg)   Physical Exam Constitutional:  General: She is not in acute distress. Cardiovascular:     Rate and Rhythm: Normal rate and regular rhythm.     Heart sounds: Normal heart sounds.  Pulmonary:     Effort: Pulmonary effort is normal.     Breath sounds: Normal breath sounds.  Abdominal:     General: Bowel sounds are normal.     Palpations: Abdomen is soft.  Skin:    General: Skin is warm and dry.  Neurological:     Mental Status: She is alert and oriented to person, place, and time.           Latest Ref Rng & Units 12/15/2021    9:20 AM  CMP  Glucose 65 - 99 mg/dL 100   BUN 7 - 25 mg/dL 9   Creatinine 0.50 - 0.99 mg/dL 0.73   Sodium 135 - 146 mmol/L 138   Potassium 3.5 - 5.3 mmol/L 4.3   Chloride 98 - 110 mmol/L 104   CO2 20 - 32 mmol/L 28   Calcium 8.6 - 10.2 mg/dL 9.5   Total Protein 6.1 - 8.1 g/dL 7.2   Total Bilirubin 0.2 - 1.2 mg/dL 0.2   AST 10 - 30 U/L 9   ALT 6 - 29 U/L 8       Latest Ref Rng & Units 11/08/2021    4:23 AM  CBC  WBC 4.0 - 10.5 K/uL 9.6   Hemoglobin 12.0 - 15.0 g/dL 8.0   Hematocrit 36.0 - 46.0 % 26.7   Platelets 150 - 400 K/uL 475     Assessment and plan- Patient is a 46 y.o. female referred for iron deficiency anemia  Iron deficiency anemia: Her insurance will approve Venofer and I recommend 5 doses of Venofer 200 mg IV given over the next 2 to 3 weeks.  I will repeat CBC ferritin iron studies B12 folate in 2 months and see her thereafter.  Discussed risks and benefits of IV iron including all but not limited to possible risk of allergic and  anaphylactic reactions.  Patient understands and agrees to proceed as planned.  With regards to etiology of iron deficiency anemia-she is getting GI work-up at [facility].  Uterine fibroids were removed last year and her menstrual cycles are lighter at this time and therefore would not explain her iron deficiency anemia entirely.   Thank you for this kind referral and the opportunity to participate in the care of this patient   Visit Diagnosis 1. Iron deficiency anemia, unspecified iron deficiency anemia type     Dr. Randa Evens, MD, MPH [facility] [phone] 06/08/2022

## 2022-06-15 MED FILL — Iron Sucrose Inj 20 MG/ML (Fe Equiv): INTRAVENOUS | Qty: 10 | Status: AC

## 2022-06-16 ENCOUNTER — Inpatient Hospital Stay: Payer: Managed Care, Other (non HMO)

## 2022-06-16 VITALS — BP 140/96 | HR 76 | Temp 96.7°F | Resp 18

## 2022-06-16 DIAGNOSIS — D509 Iron deficiency anemia, unspecified: Secondary | ICD-10-CM | POA: Diagnosis not present

## 2022-06-16 MED ORDER — SODIUM CHLORIDE 0.9 % IV SOLN
200.0000 mg | INTRAVENOUS | Status: DC
  Administered 2022-06-16: 200 mg via INTRAVENOUS
  Filled 2022-06-16: qty 200

## 2022-06-16 MED ORDER — SODIUM CHLORIDE 0.9 % IV SOLN
Freq: Once | INTRAVENOUS | Status: AC
  Filled 2022-06-16: qty 250

## 2022-06-16 NOTE — Patient Instructions (Signed)

## 2022-06-20 MED FILL — Iron Sucrose Inj 20 MG/ML (Fe Equiv): INTRAVENOUS | Qty: 10 | Status: AC

## 2022-06-21 ENCOUNTER — Inpatient Hospital Stay: Payer: Managed Care, Other (non HMO)

## 2022-06-28 ENCOUNTER — Inpatient Hospital Stay: Payer: Managed Care, Other (non HMO) | Attending: Oncology

## 2022-06-28 VITALS — BP 112/78 | HR 79 | Temp 98.0°F

## 2022-06-28 DIAGNOSIS — D509 Iron deficiency anemia, unspecified: Secondary | ICD-10-CM

## 2022-06-28 DIAGNOSIS — Z79899 Other long term (current) drug therapy: Secondary | ICD-10-CM | POA: Diagnosis not present

## 2022-06-28 MED ORDER — SODIUM CHLORIDE 0.9 % IV SOLN
200.0000 mg | INTRAVENOUS | Status: DC
  Administered 2022-06-28: 200 mg via INTRAVENOUS
  Filled 2022-06-28: qty 200

## 2022-06-28 MED ORDER — SODIUM CHLORIDE 0.9 % IV SOLN
Freq: Once | INTRAVENOUS | Status: AC
  Filled 2022-06-28: qty 250

## 2022-06-28 NOTE — Patient Instructions (Signed)

## 2022-06-29 MED FILL — Iron Sucrose Inj 20 MG/ML (Fe Equiv): INTRAVENOUS | Qty: 10 | Status: AC

## 2022-06-30 ENCOUNTER — Inpatient Hospital Stay: Payer: Managed Care, Other (non HMO)

## 2022-06-30 VITALS — BP 139/88 | HR 76 | Temp 97.0°F | Resp 18

## 2022-06-30 DIAGNOSIS — D509 Iron deficiency anemia, unspecified: Secondary | ICD-10-CM | POA: Diagnosis not present

## 2022-06-30 IMAGING — US US PELVIS COMPLETE
2 series · 13 of 25 positions shown · non-contrast
Comparison: Prior ultrasound from 11/07/2018

CLINICAL DATA: Initial evaluation for dysfunctional uterine
bleeding, history of fibroid uterus.



[Series 1: us pelvis complete · 0.25mm/px · 60 acquisitions, 12 frames shown (1 of 2)]
[im 1/60]
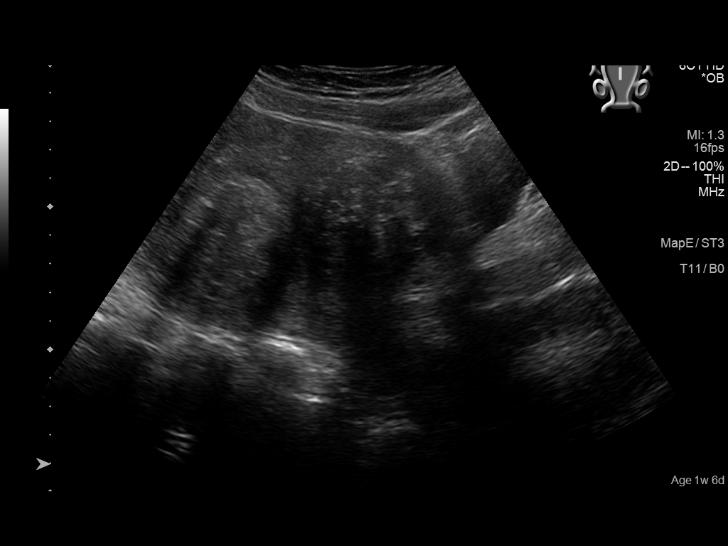
[im 6/60]
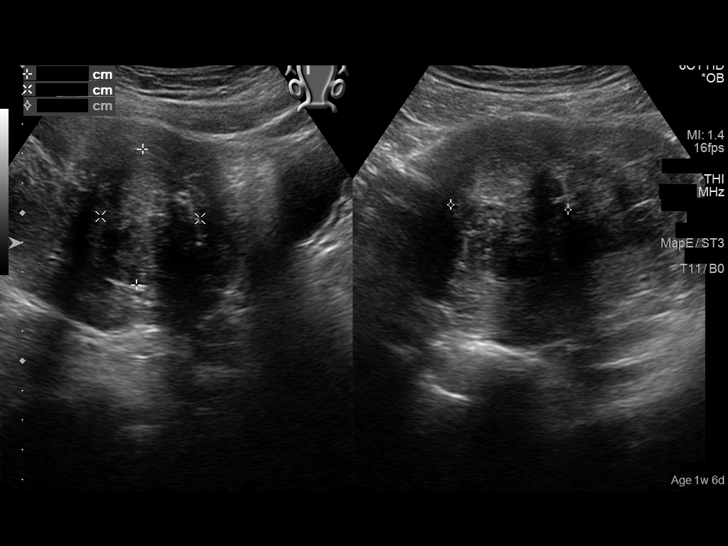
[im 11/60]
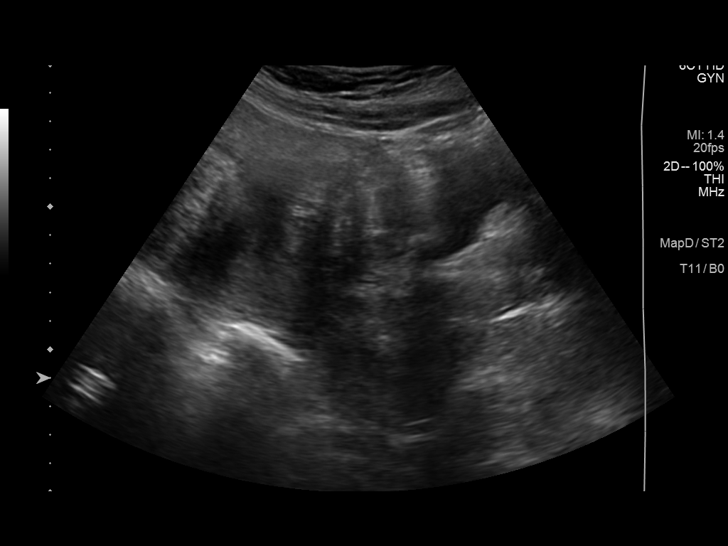
[im 16/60]
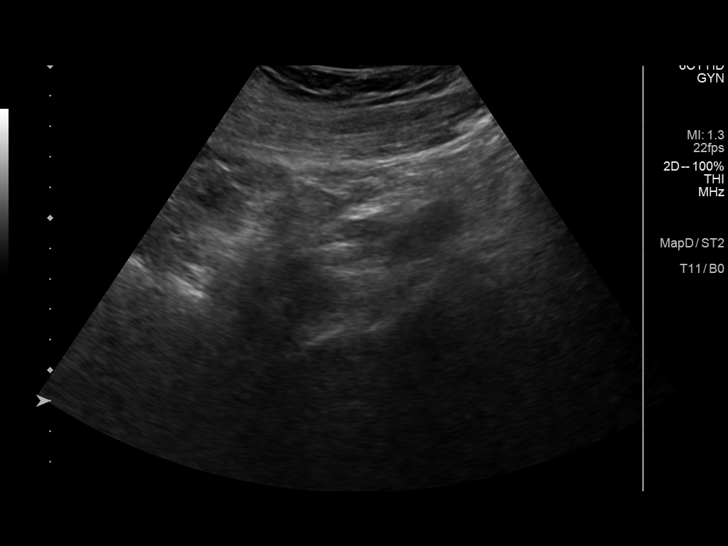
[im 21/60]
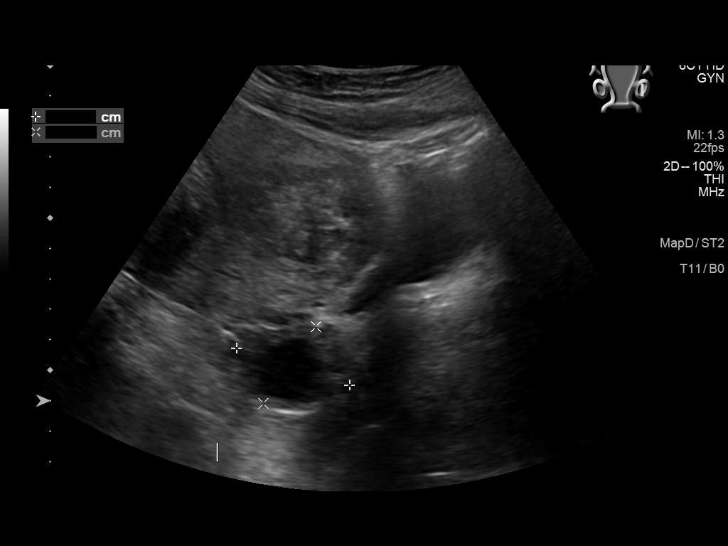
[im 26/60]
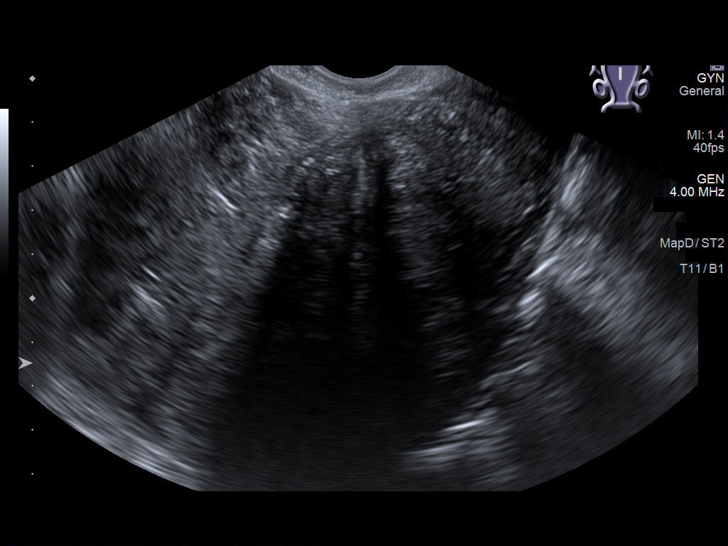
[im 31/60]
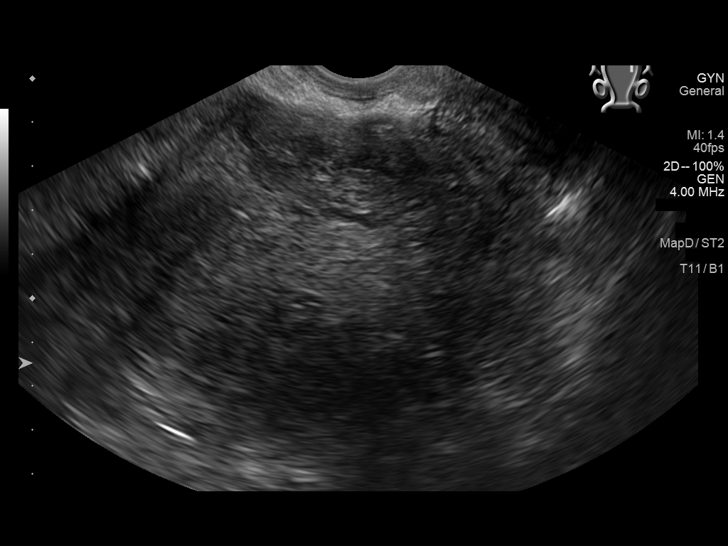
[im 36/60]
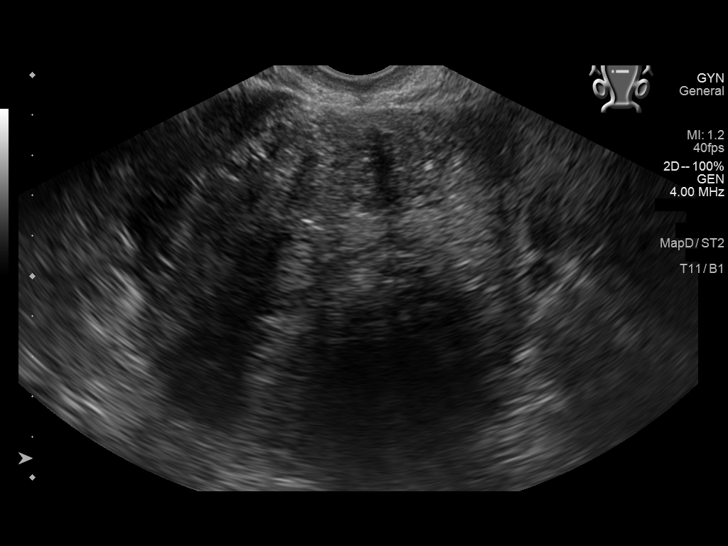
[im 42/60]
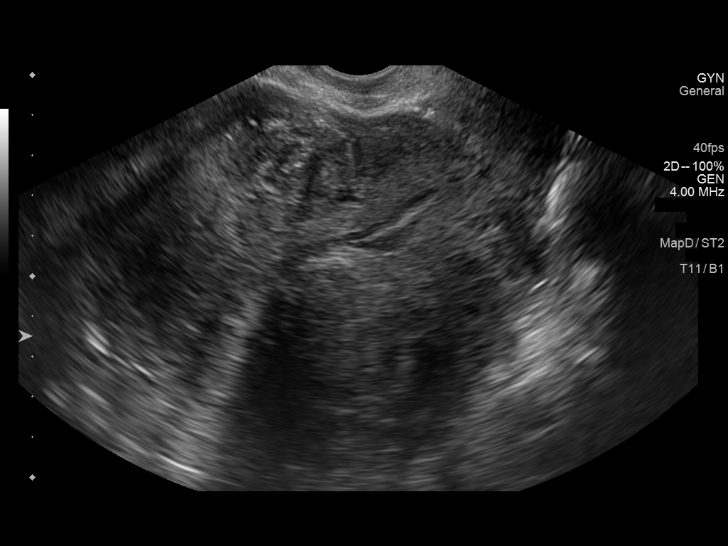
[im 47/60]
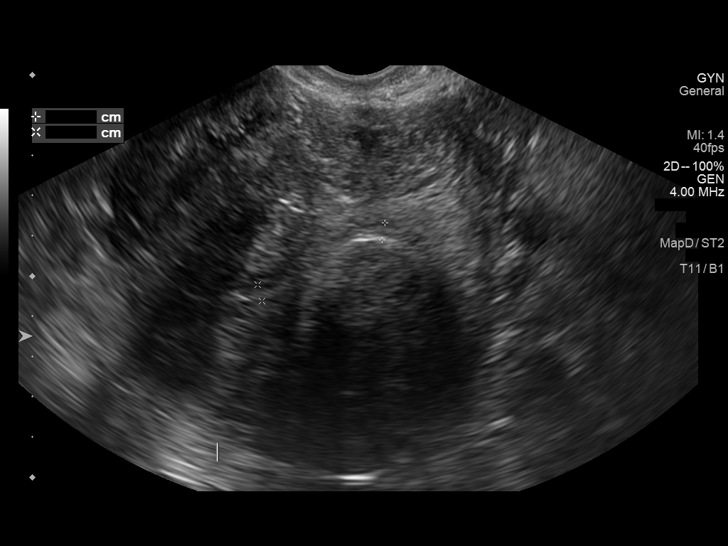
[im 52/60]
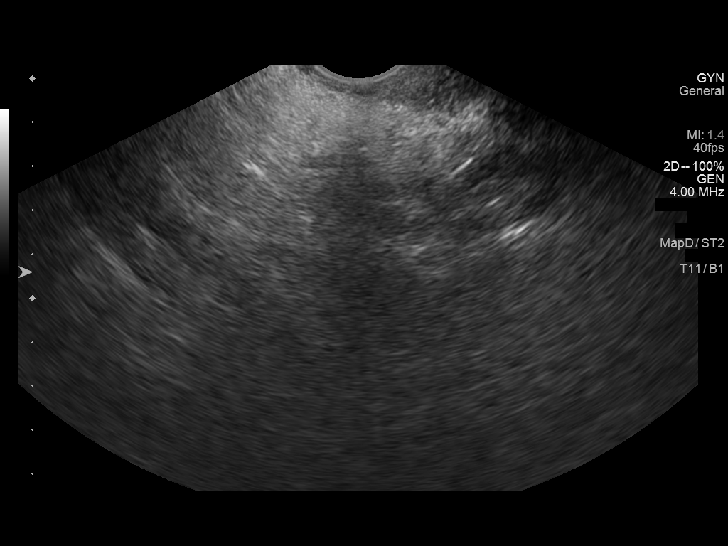
[im 57/60]
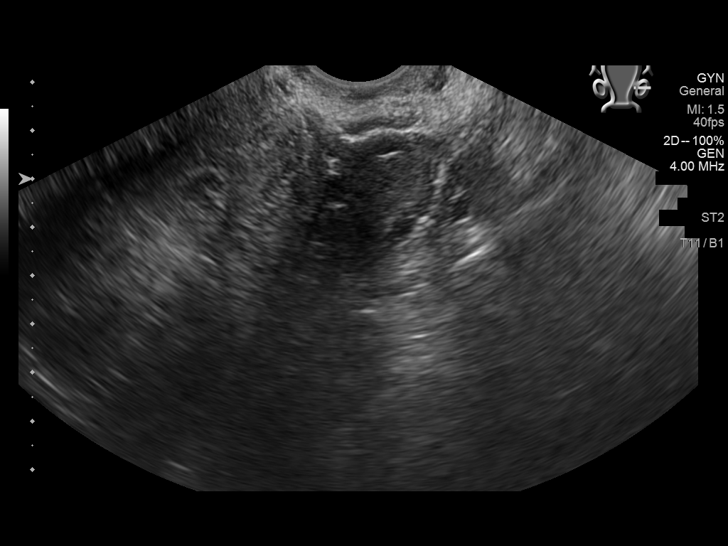

[Series 2: us pelvis complete · 0.25mm/px · 1 of 1 slices shown (2 of 2)]
[im 1/1]
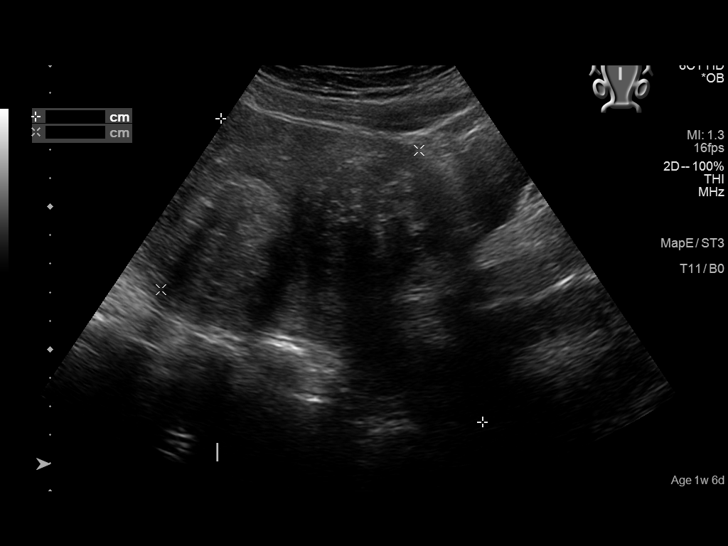

[13 of 25 positions shown; findings below may reference images not displayed]

FINDINGS: Uterus

Measurements: 14.0 x 10.3 x 12.2 cm = volume: 917.9 mL. Uterus is
anteverted with at least 7 separate fibroids visualized, largest of
which are measured. 5.4 x 4.0 x 4.6 cm submucosal fibroid at the
posterior uterine body. 4.5 x 4.3 x 4.3 cm submucosal fibroid at the
anterior uterine body. 4.6 x 3.4 x 4.0 cm intramural fibroid at the
right uterine body.

Endometrium

Thickness: 7 mm. Endometrial complex is somewhat deformed and
obscured by adjacent uterine fibroids, with no definite focal
abnormality.

Right ovary

Not visualized.  No adnexal mass.

Left ovary

Measurements: 3.9 x 2.9 x 2.1 cm = volume: 12.4 mL. 2.2 x 2.0 x
cm dominant follicle noted. No other adnexal mass.

Other findings

No abnormal free fluid.
IMPRESSION: 1. Enlarged fibroid uterus as detailed above.
2. Endometrial stripe measures up to 7 mm in thickness. If bleeding
remains unresponsive to hormonal or medical therapy, sonohysterogram
should be considered for focal lesion work-up. (Ref: Radiological
Reasoning: Algorithmic Workup of Abnormal Vaginal Bleeding with
Endovaginal Sonography and Sonohysterography. AJR [id number]; 191:S68-73).
3. Normal left ovary, with nonvisualization of the right ovary. No
adnexal mass.

## 2022-06-30 MED ORDER — SODIUM CHLORIDE 0.9 % IV SOLN
200.0000 mg | INTRAVENOUS | Status: DC
  Administered 2022-06-30: 200 mg via INTRAVENOUS
  Filled 2022-06-30: qty 200

## 2022-06-30 MED ORDER — SODIUM CHLORIDE 0.9 % IV SOLN
Freq: Once | INTRAVENOUS | Status: AC
  Filled 2022-06-30: qty 250

## 2022-06-30 NOTE — Progress Notes (Signed)
1559: Pt refused full 30 minute post observation. Pt observed 25 minutes post Venofer. Pt and VS stable at discharge.

## 2022-07-07 ENCOUNTER — Inpatient Hospital Stay: Payer: Managed Care, Other (non HMO)

## 2022-07-07 VITALS — BP 130/84 | HR 75 | Temp 97.6°F | Resp 18

## 2022-07-07 DIAGNOSIS — D509 Iron deficiency anemia, unspecified: Secondary | ICD-10-CM | POA: Diagnosis not present

## 2022-07-07 MED ORDER — SODIUM CHLORIDE 0.9 % IV SOLN
200.0000 mg | INTRAVENOUS | Status: DC
  Administered 2022-07-07: 200 mg via INTRAVENOUS
  Filled 2022-07-07: qty 200

## 2022-07-07 MED ORDER — SODIUM CHLORIDE 0.9 % IV SOLN
Freq: Once | INTRAVENOUS | Status: AC
  Filled 2022-07-07: qty 250

## 2022-07-07 NOTE — Progress Notes (Signed)
Pt refused to stay the 30 mins after Venofer. Stayed for 10 mins. Pt understood and educated.

## 2022-07-21 ENCOUNTER — Inpatient Hospital Stay: Payer: Managed Care, Other (non HMO)

## 2022-07-21 VITALS — BP 138/89 | HR 78 | Temp 96.6°F | Resp 18

## 2022-07-21 DIAGNOSIS — D509 Iron deficiency anemia, unspecified: Secondary | ICD-10-CM

## 2022-07-21 MED ORDER — SODIUM CHLORIDE 0.9 % IV SOLN
Freq: Once | INTRAVENOUS | Status: AC
  Filled 2022-07-21: qty 250

## 2022-07-21 MED ORDER — SODIUM CHLORIDE 0.9 % IV SOLN
200.0000 mg | INTRAVENOUS | Status: DC
  Administered 2022-07-21: 200 mg via INTRAVENOUS
  Filled 2022-07-21: qty 200

## 2022-08-05 ENCOUNTER — Other Ambulatory Visit: Payer: Self-pay

## 2022-08-05 DIAGNOSIS — D500 Iron deficiency anemia secondary to blood loss (chronic): Secondary | ICD-10-CM

## 2022-08-08 ENCOUNTER — Encounter: Payer: Self-pay | Admitting: Oncology

## 2022-08-08 ENCOUNTER — Inpatient Hospital Stay: Payer: Managed Care, Other (non HMO) | Attending: Oncology

## 2022-08-08 ENCOUNTER — Inpatient Hospital Stay (HOSPITAL_BASED_OUTPATIENT_CLINIC_OR_DEPARTMENT_OTHER): Payer: Managed Care, Other (non HMO) | Admitting: Oncology

## 2022-08-08 VITALS — BP 134/89 | HR 75 | Temp 97.1°F | Resp 17 | Wt 212.9 lb

## 2022-08-08 DIAGNOSIS — Z800 Family history of malignant neoplasm of digestive organs: Secondary | ICD-10-CM | POA: Insufficient documentation

## 2022-08-08 DIAGNOSIS — D500 Iron deficiency anemia secondary to blood loss (chronic): Secondary | ICD-10-CM | POA: Diagnosis present

## 2022-08-08 LAB — CBC WITH DIFFERENTIAL/PLATELET
Abs Immature Granulocytes: 0.02 10*3/uL (ref 0.00–0.07)
Basophils Absolute: 0 10*3/uL (ref 0.0–0.1)
Basophils Relative: 0 %
Eosinophils Absolute: 0.1 10*3/uL (ref 0.0–0.5)
Eosinophils Relative: 2 %
HCT: 34.4 % — ABNORMAL LOW (ref 36.0–46.0)
Hemoglobin: 10.5 g/dL — ABNORMAL LOW (ref 12.0–15.0)
Immature Granulocytes: 1 %
Lymphocytes Relative: 44 %
Lymphs Abs: 2 10*3/uL (ref 0.7–4.0)
MCH: 22.7 pg — ABNORMAL LOW (ref 26.0–34.0)
MCHC: 30.5 g/dL (ref 30.0–36.0)
MCV: 74.3 fL — ABNORMAL LOW (ref 80.0–100.0)
Monocytes Absolute: 0.4 10*3/uL (ref 0.1–1.0)
Monocytes Relative: 10 %
Neutro Abs: 1.9 10*3/uL (ref 1.7–7.7)
Neutrophils Relative %: 43 %
Platelets: 527 10*3/uL — ABNORMAL HIGH (ref 150–400)
RBC: 4.63 MIL/uL (ref 3.87–5.11)
RDW: 25.8 % — ABNORMAL HIGH (ref 11.5–15.5)
WBC: 4.4 10*3/uL (ref 4.0–10.5)
nRBC: 0 % (ref 0.0–0.2)

## 2022-08-08 LAB — FOLATE: Folate: 6.5 ng/mL (ref >5.9)

## 2022-08-08 LAB — IRON AND TIBC
Iron: 48 ug/dL (ref 28–170)
Saturation Ratios: 12 % (ref 10.4–31.8)
TIBC: 396 ug/dL (ref 250–450)
UIBC: 348 ug/dL

## 2022-08-08 LAB — FERRITIN: Ferritin: 46 ng/mL (ref 11–307)

## 2022-08-08 LAB — VITAMIN B12: Vitamin B-12: 274 pg/mL (ref 180–914)

## 2022-08-08 NOTE — Progress Notes (Signed)
Hematology/Oncology Consult note [facility]  [phone] [phone]  Patient Care Team: Steele Sizer, MD as PCP - General (Family Medicine) Steele Sizer, MD as Attending Physician (Family Medicine) Bary Castilla Forest Gleason, MD (General Surgery)   Name of the patient: Sharon Herring  [id number]  04-02-1976   Date of visit: 08/08/22  Diagnosis-iron deficiency anemia  Chief complaint/ Reason for visit-routine follow-up of iron deficiency anemia  Heme/Onc history:  Patient is a 46 year old female referred for iron deficiency anemia.  Her most recent CBC from 05/24/2022 showed an H&H of 9/31.3 with an MCV of 70.  Platelet count was elevated at 550 with a normal white count of 5.2.  Iron studies showed an elevated TIBC of 517.  Ferritin levels were low at 6. Patient has had an EGD by Dr. Bary Castilla back in May 2017 which was normal but showed 2 gastric polyps.  These were negative for dysplasia and malignancy.  Patient has not had any colonoscopy yet.   Patient had surgery for uterine fibroids last year.  Since then she reports that her menstrual cycles have been much lighter and they continue for about 5 days.  She has had chronic anemia over the last 1 year with a hemoglobin that has been as low as 7 in the past.  Denies any blood loss in her stool or urine.  Denies any dark melanotic stools.  Denies any family history of colon cancer.  Denies any consistent use of NSAIDs Goody powder or BC powder.     Interval history-patient reports some improvement in her energy after receiving IV iron.  She is scheduled for EGD and colonoscopy in early January 2024.  Menstrual cycles continue to be light.  ECOG PS- 0 Pain scale- 0   Review of systems- Review of Systems  Constitutional:  Positive for malaise/fatigue. Negative for chills, fever and weight loss.  HENT:  Negative for congestion, ear discharge and nosebleeds.   Eyes:  Negative for blurred vision.   Respiratory:  Negative for cough, hemoptysis, sputum production, shortness of breath and wheezing.   Cardiovascular:  Negative for chest pain, palpitations, orthopnea and claudication.  Gastrointestinal:  Negative for abdominal pain, blood in stool, constipation, diarrhea, heartburn, melena, nausea and vomiting.  Genitourinary:  Negative for dysuria, flank pain, frequency, hematuria and urgency.  Musculoskeletal:  Negative for back pain, joint pain and myalgias.  Skin:  Negative for rash.  Neurological:  Negative for dizziness, tingling, focal weakness, seizures, weakness and headaches.  Endo/Heme/Allergies:  Does not bruise/bleed easily.  Psychiatric/Behavioral:  Negative for depression and suicidal ideas. The patient does not have insomnia.       Allergies  Allergen Reactions   Aspirin Nausea And Vomiting   Norethindrone Shortness Of Breath and Rash   Oxycodone-Acetaminophen Rash and Shortness Of Breath   Lexapro [Escitalopram] Palpitations   Tape Other (See Comments)    Burning feeling. Has left mark on skin.   Belviq [Lorcaserin Hcl] Other (See Comments)    headache   Dicyclomine Nausea And Vomiting   Contrave [Naltrexone-Bupropion Hcl Er] Anxiety    Altered mental state     Past Medical History:  Diagnosis Date   Acute pulmonary embolism (HCC)    Anemia    Dizziness    Fibroid uterus    GERD (gastroesophageal reflux disease)    History of blood transfusion 02/26/2021   History of thrombocytosis    History of uterine fibroid 03/14/2014   myomectomy   IBS (irritable bowel  syndrome)    Iron deficiency anemia 03/14/2014   Migraine    Polyp of stomach 01/06/2016   Stomach polyp, pyloric, consistent with prolapse.    Routine screening for STI (sexually transmitted infection) 11/15/2021     Past Surgical History:  Procedure Laterality Date   APPENDECTOMY  2012   CHOLECYSTECTOMY     CHROMOPERTUBATION N/A 02/18/2021   Procedure: CHROMOPERTUBATION;  Surgeon: Rubie Maid,  MD;  Location: [facility];  Service: Gynecology;  Laterality: N/A;   ESOPHAGOGASTRODUODENOSCOPY (EGD) WITH PROPOFOL N/A 01/06/2016   Procedure: ESOPHAGOGASTRODUODENOSCOPY (EGD) WITH PROPOFOL;  Surgeon: Robert Bellow, MD;  Location: [facility];  Service: Endoscopy;  Laterality: N/A;   HERNIA REPAIR     IR FLUORO GUIDED NEEDLE PLC ASPIRATION/INJECTION LOC  11/02/2021   ROBOT ASSISTED MYOMECTOMY N/A 02/18/2021   Procedure: XI ROBOTIC ASSISTED MYOMECTOMY;  Surgeon: Rubie Maid, MD;  Location: [facility];  Service: Gynecology;  Laterality: N/A;   ROBOTIC ASSISTED LAPAROSCOPIC CHOLECYSTECTOMY N/A 03/03/2016   Procedure: ROBOTIC ASSISTED LAPAROSCOPIC CHOLECYSTECTOMY ;  Surgeon: Clayburn Pert, MD;  Location: [facility];  Service: General;  Laterality: N/A;   UTERINE FIBROID SURGERY  2012   XI ROBOTIC ASSISTED VENTRAL HERNIA N/A 07/15/2019   Procedure: XI ROBOTIC ASSISTED VENTRAL HERNIA;  Surgeon: Jules Husbands, MD;  Location: [facility];  Service: General;  Laterality: N/A;    Social History   Socioeconomic History   Marital status: Single    Spouse name: Not on file   Number of children: 0   Years of education: Not on file   Highest education level: Bachelor's degree (e.g., BA, AB, BS)  Occupational History   Occupation: CMA  Tobacco Use   Smoking status: Never   Smokeless tobacco: Never  Vaping Use   Vaping Use: Never used  Substance and Sexual Activity   Alcohol use: No    Alcohol/week: 0.0 standard drinks of alcohol   Drug use: No   Sexual activity: Not Currently    Partners: Male    Birth control/protection: None  Other Topics Concern   Not on file  Social History Narrative   Lives alone, works at [employer] Strain: [place]  (04/15/2021)   Overall Financial Resource Strain (CARDIA)    Difficulty of Paying Living Expenses: Not hard at all  Food Insecurity: No Food Insecurity (04/15/2021)   Hunger Vital Sign     Worried About Running Out of Food in the Last Year: Never true    [place] in the Last Year: Never true  Transportation Needs: No Transportation Needs (04/15/2021)   PRAPARE - Hydrologist (Medical): No    Lack of Transportation (Non-Medical): No  Physical Activity: Insufficiently Active (04/15/2021)   Exercise Vital Sign    Days of Exercise per Week: 2 days    Minutes of Exercise per Session: 30 min  Stress: No Stress Concern Present (04/15/2021)   Garden    Feeling of Stress : Only a little  Social Connections: Socially Integrated (04/15/2021)   Social Connection and Isolation Panel [NHANES]    Frequency of Communication with Friends and Family: More than three times a week    Frequency of Social Gatherings with Friends and Family: More than three times a week    Attends Religious Services: More than 4 times per year    Active Member of [employer] or Organizations:  Yes    Attends Club or Organization Meetings: 1 to 4 times per year    Marital Status: Married  Human resources officer Violence: Not At Risk (04/15/2021)   Humiliation, Afraid, Rape, and Kick questionnaire    Fear of Current or Ex-Partner: No    Emotionally Abused: No    Physically Abused: No    Sexually Abused: No    Family History  Problem Relation Age of Onset   Hypertension Mother    Hypertension Father    Pancreatic cancer Father 85   Breast cancer Neg Hx    Colon cancer Neg Hx      Current Outpatient Medications:    furosemide (LASIX) 20 MG tablet, Take 20 mg by mouth., Disp: , Rfl:    ibuprofen (ADVIL) 200 MG tablet, Take 1 tablet (200 mg total) by mouth every 6 (six) hours as needed for moderate pain., Disp: , Rfl:    iron polysaccharides (NIFEREX) 150 MG capsule, Take by mouth., Disp: , Rfl:    Multiple Vitamins-Minerals (MULTIVITAMIN ADULT) TABS, Take 1 tablet by mouth daily., Disp: 30 tablet, Rfl: 0   predniSONE  (DELTASONE) 2.5 MG tablet, Take 2.5 mg by mouth daily with breakfast., Disp: , Rfl:    acetaminophen (TYLENOL) 325 MG tablet, Take 2 tablets (650 mg total) by mouth every 4 (four) hours as needed for mild pain or fever. (Patient not taking: Reported on 06/08/2022), Disp: , Rfl:    albuterol (VENTOLIN HFA) 108 (90 Base) MCG/ACT inhaler, Inhale 2 puffs into the lungs every 6 (six) hours as needed for wheezing or shortness of breath. (Patient not taking: Reported on 06/08/2022), Disp: 18 g, Rfl: 0   apixaban (ELIQUIS) 5 MG TABS tablet, Take 1 tablet (5 mg total) by mouth 2 (two) times daily. (Patient not taking: Reported on 06/08/2022), Disp: 60 tablet, Rfl: 4   ferrous sulfate (FERROUSUL) 325 (65 FE) MG tablet, Take 1 tablet (325 mg total) by mouth 3 (three) times daily with meals. (Patient not taking: Reported on 06/08/2022), Disp: 90 tablet, Rfl: 1   HYDROmorphone (DILAUDID) 2 MG tablet, Take 0.5 tablets (1 mg total) by mouth every 12 (twelve) hours as needed for severe pain., Disp: 10 tablet, Rfl: 0   letrozole (FEMARA) 2.5 MG tablet, Take 2.5 mg by mouth as directed. (Patient not taking: Reported on 06/08/2022), Disp: , Rfl:    leuprolide (LUPRON DEPOT, 8-MONTH,) 11.25 MG injection, Inject 11.25 mg into the muscle every 3 (three) months. (Patient not taking: Reported on 06/08/2022), Disp: 1 each, Rfl: 1  Current Facility-Administered Medications:    cyanocobalamin ((VITAMIN B-12)) injection 1,000 mcg, 1,000 mcg, Intramuscular, Once, Steele Sizer, MD  Physical exam:  Vitals:   08/08/22 1515  BP: 134/89  Pulse: 75  Resp: 17  Temp: (!) 97.1 F (36.2 C)  SpO2: 100%  Weight: 212 lb 14.4 oz (96.6 kg)   Physical Exam Constitutional:      General: She is not in acute distress. Cardiovascular:     Rate and Rhythm: Normal rate and regular rhythm.     Heart sounds: Normal heart sounds.  Pulmonary:     Effort: Pulmonary effort is normal.     Breath sounds: Normal breath sounds.  Abdominal:      General: Bowel sounds are normal.     Palpations: Abdomen is soft.  Skin:    General: Skin is warm and dry.  Neurological:     Mental Status: She is alert and oriented to person, place, and time.  Latest Ref Rng & Units 12/15/2021    9:20 AM  CMP  Glucose 65 - 99 mg/dL 100   BUN 7 - 25 mg/dL 9   Creatinine 0.50 - 0.99 mg/dL 0.73   Sodium 135 - 146 mmol/L 138   Potassium 3.5 - 5.3 mmol/L 4.3   Chloride 98 - 110 mmol/L 104   CO2 20 - 32 mmol/L 28   Calcium 8.6 - 10.2 mg/dL 9.5   Total Protein 6.1 - 8.1 g/dL 7.2   Total Bilirubin 0.2 - 1.2 mg/dL 0.2   AST 10 - 30 U/L 9   ALT 6 - 29 U/L 8       Latest Ref Rng & Units 08/08/2022    3:01 PM  CBC  WBC 4.0 - 10.5 K/uL 4.4   Hemoglobin 12.0 - 15.0 g/dL 10.5   Hematocrit 36.0 - 46.0 % 34.4   Platelets 150 - 400 K/uL 527      Assessment and plan- Patient is a 46 y.o. female here for routine follow-up of iron deficiency anemia  After receiving 5 doses of Venofer hemoglobin is improved from 8-10.5.  It has not normalized all the way and microcytosis persists.  Iron studies from today are pending.  If she has continued iron deficiency she may require more Venofer.  If labs are not suggestive of iron deficiency at this point I will get hemoglobin electrophoresis to rule out thalassemia or sickle cell disorders.  I will repeat CBC ferritin and iron studies in 3 and 6 months and see her back in 6 months.  She is scheduled to undergo GI evaluation in January 2024   Visit Diagnosis 1. Iron deficiency anemia due to chronic blood loss      Dr. Randa Evens, MD, MPH [facility] [phone] 08/08/2022 3:39 PM

## 2022-08-08 NOTE — Progress Notes (Signed)
Patient here for oncology follow-up appointment, expresses concerns of swelling & chest pain

## 2022-08-31 ENCOUNTER — Inpatient Hospital Stay: Payer: Managed Care, Other (non HMO) | Attending: Oncology

## 2022-08-31 VITALS — BP 132/84 | HR 74 | Resp 16

## 2022-08-31 DIAGNOSIS — D509 Iron deficiency anemia, unspecified: Secondary | ICD-10-CM | POA: Diagnosis present

## 2022-08-31 MED ORDER — SODIUM CHLORIDE 0.9 % IV SOLN
INTRAVENOUS | Status: DC | PRN
  Filled 2022-08-31: qty 250

## 2022-08-31 MED ORDER — SODIUM CHLORIDE 0.9 % IV SOLN
200.0000 mg | INTRAVENOUS | Status: DC
  Administered 2022-08-31: 200 mg via INTRAVENOUS
  Filled 2022-08-31: qty 200

## 2022-08-31 NOTE — Progress Notes (Signed)
Patient declined to wait the 30 minutes post observation period. Patient educated and verbalized understanding.

## 2022-09-07 ENCOUNTER — Inpatient Hospital Stay: Payer: Managed Care, Other (non HMO)

## 2022-09-12 ENCOUNTER — Ambulatory Visit
Admission: RE | Admit: 2022-09-12 | Discharge: 2022-09-12 | Disposition: A | Discharge status: Home / Self Care | Payer: Managed Care, Other (non HMO) | Attending: Gastroenterology | Admitting: Gastroenterology

## 2022-09-12 ENCOUNTER — Ambulatory Visit: Payer: Managed Care, Other (non HMO) | Admitting: Anesthesiology

## 2022-09-12 ENCOUNTER — Encounter: Payer: Self-pay | Admitting: *Deleted

## 2022-09-12 ENCOUNTER — Encounter
Admission: RE | Disposition: A | Discharge status: Home / Self Care | Payer: Self-pay | Source: Home / Self Care | Attending: Gastroenterology

## 2022-09-12 DIAGNOSIS — K5730 Diverticulosis of large intestine without perforation or abscess without bleeding: Secondary | ICD-10-CM | POA: Diagnosis not present

## 2022-09-12 DIAGNOSIS — Z7901 Long term (current) use of anticoagulants: Secondary | ICD-10-CM | POA: Insufficient documentation

## 2022-09-12 DIAGNOSIS — K640 First degree hemorrhoids: Secondary | ICD-10-CM | POA: Insufficient documentation

## 2022-09-12 DIAGNOSIS — Z7952 Long term (current) use of systemic steroids: Secondary | ICD-10-CM | POA: Diagnosis not present

## 2022-09-12 DIAGNOSIS — K2940 Chronic atrophic gastritis without bleeding: Secondary | ICD-10-CM | POA: Diagnosis not present

## 2022-09-12 DIAGNOSIS — Z86711 Personal history of pulmonary embolism: Secondary | ICD-10-CM | POA: Insufficient documentation

## 2022-09-12 DIAGNOSIS — D509 Iron deficiency anemia, unspecified: Secondary | ICD-10-CM | POA: Diagnosis not present

## 2022-09-12 DIAGNOSIS — K589 Irritable bowel syndrome without diarrhea: Secondary | ICD-10-CM | POA: Diagnosis not present

## 2022-09-12 DIAGNOSIS — G4730 Sleep apnea, unspecified: Secondary | ICD-10-CM | POA: Diagnosis not present

## 2022-09-12 DIAGNOSIS — K219 Gastro-esophageal reflux disease without esophagitis: Secondary | ICD-10-CM | POA: Diagnosis not present

## 2022-09-12 HISTORY — PX: ESOPHAGOGASTRODUODENOSCOPY (EGD) WITH PROPOFOL: SHX5813

## 2022-09-12 HISTORY — PX: COLONOSCOPY WITH PROPOFOL: SHX5780

## 2022-09-12 LAB — POCT PREGNANCY, URINE: Preg Test, Ur: NEGATIVE

## 2022-09-12 SURGERY — ESOPHAGOGASTRODUODENOSCOPY (EGD) WITH PROPOFOL
Anesthesia: General

## 2022-09-12 MED ORDER — LIDOCAINE HCL (PF) 2 % IJ SOLN
INTRAMUSCULAR | Status: AC
  Filled 2022-09-12: qty 5

## 2022-09-12 MED ORDER — SODIUM CHLORIDE 0.9 % IV SOLN: INTRAVENOUS | Status: DC

## 2022-09-12 MED ORDER — GLYCOPYRROLATE 0.2 MG/ML IJ SOLN
INTRAMUSCULAR | Status: DC | PRN
  Administered 2022-09-12: .2 mg via INTRAVENOUS

## 2022-09-12 MED ORDER — GLYCOPYRROLATE 0.2 MG/ML IJ SOLN
INTRAMUSCULAR | Status: AC
  Filled 2022-09-12: qty 1

## 2022-09-12 MED ORDER — PROPOFOL 500 MG/50ML IV EMUL
INTRAVENOUS | Status: DC | PRN
  Administered 2022-09-12: 125 ug/kg/min via INTRAVENOUS

## 2022-09-12 MED ORDER — PROPOFOL 10 MG/ML IV BOLUS
INTRAVENOUS | Status: DC | PRN
  Administered 2022-09-12: 100 mg via INTRAVENOUS
  Administered 2022-09-12 (×2): 50 mg via INTRAVENOUS

## 2022-09-12 MED ORDER — LIDOCAINE HCL (CARDIAC) PF 100 MG/5ML IV SOSY
PREFILLED_SYRINGE | INTRAVENOUS | Status: DC | PRN
  Administered 2022-09-12: 100 mg via INTRAVENOUS

## 2022-09-12 NOTE — Interval H&P Note (Signed)
History and Physical Interval Note:  09/12/2022 9:59 AM  Sharon Herring  has presented today for surgery, with the diagnosis of IDA,.  The various methods of treatment have been discussed with the patient and family. After consideration of risks, benefits and other options for treatment, the patient has consented to  Procedure(s): ESOPHAGOGASTRODUODENOSCOPY (EGD) WITH PROPOFOL (N/A) COLONOSCOPY WITH PROPOFOL (N/A) as a surgical intervention.  The patient's history has been reviewed, patient examined, no change in status, stable for surgery.  I have reviewed the patient's chart and labs.  Questions were answered to the patient's satisfaction.     Lesly Rubenstein  Ok to proceed with EGD/Colonoscopy

## 2022-09-12 NOTE — Transfer of Care (Signed)
Immediate Anesthesia Transfer of Care Note  Patient: Sharon Herring  Procedure(s) Performed: ESOPHAGOGASTRODUODENOSCOPY (EGD) WITH PROPOFOL COLONOSCOPY WITH PROPOFOL  Patient Location: PACU  Anesthesia Type:General  Level of Consciousness: drowsy  Airway & Oxygen Therapy: Patient Spontanous Breathing  Post-op Assessment: Report given to RN and Post -op Vital signs reviewed and stable  Post vital signs: Reviewed and stable  Last Vitals:  Vitals Value Taken Time  BP 111/84   Temp 97.1   Pulse 91 09/12/22 1035  Resp 29 09/12/22 1035  SpO2 98 % 09/12/22 1035  Vitals shown include unvalidated device data.  Last Pain:  Vitals:   09/12/22 0941  TempSrc: Temporal         Complications: No notable events documented.

## 2022-09-12 NOTE — H&P (Signed)
Outpatient short stay form Pre-procedure 09/12/2022  Sharon Rubenstein, MD  Primary Physician: Steele Sizer, MD  Reason for visit:  IDA  History of present illness:    47 y/o lady with history of PE and chronic IDA here for EGD/Colonoscopy for IDA. No blood thinners currently. History of appendectomy, cholecystectomy, and fibroid surgery. No family history of GI malignancies.    Current Facility-Administered Medications:    0.9 %  sodium chloride infusion, , Intravenous, Continuous, Rosalin Buster, Hilton Cork, MD  Facility-Administered Medications Prior to Admission  Medication Dose Route Frequency Provider Last Rate Last Admin   cyanocobalamin ((VITAMIN B-12)) injection 1,000 mcg  1,000 mcg Intramuscular Once Steele Sizer, MD       Medications Prior to Admission  Medication Sig Dispense Refill Last Dose   furosemide (LASIX) 20 MG tablet Take 20 mg by mouth.   Past Month   ibuprofen (ADVIL) 200 MG tablet Take 1 tablet (200 mg total) by mouth every 6 (six) hours as needed for moderate pain.   Past Month   predniSONE (DELTASONE) 2.5 MG tablet Take 2.5 mg by mouth daily with breakfast.   Past Week   acetaminophen (TYLENOL) 325 MG tablet Take 2 tablets (650 mg total) by mouth every 4 (four) hours as needed for mild pain or fever. (Patient not taking: Reported on 06/08/2022)      albuterol (VENTOLIN HFA) 108 (90 Base) MCG/ACT inhaler Inhale 2 puffs into the lungs every 6 (six) hours as needed for wheezing or shortness of breath. (Patient not taking: Reported on 06/08/2022) 18 g 0    apixaban (ELIQUIS) 5 MG TABS tablet Take 1 tablet (5 mg total) by mouth 2 (two) times daily. (Patient not taking: Reported on 06/08/2022) 60 tablet 4    ferrous sulfate (FERROUSUL) 325 (65 FE) MG tablet Take 1 tablet (325 mg total) by mouth 3 (three) times daily with meals. (Patient not taking: Reported on 06/08/2022) 90 tablet 1    HYDROmorphone (DILAUDID) 2 MG tablet Take 0.5 tablets (1 mg total) by mouth every  12 (twelve) hours as needed for severe pain. 10 tablet 0    iron polysaccharides (NIFEREX) 150 MG capsule Take by mouth.   09/01/2022   letrozole (FEMARA) 2.5 MG tablet Take 2.5 mg by mouth as directed. (Patient not taking: Reported on 06/08/2022)      leuprolide (LUPRON DEPOT, 69-MONTH,) 11.25 MG injection Inject 11.25 mg into the muscle every 3 (three) months. (Patient not taking: Reported on 06/08/2022) 1 each 1    Multiple Vitamins-Minerals (MULTIVITAMIN ADULT) TABS Take 1 tablet by mouth daily. 30 tablet 0      Allergies  Allergen Reactions   Aspirin Nausea And Vomiting   Norethindrone Shortness Of Breath and Rash   Oxycodone-Acetaminophen Rash and Shortness Of Breath   Lexapro [Escitalopram] Palpitations   Tape Other (See Comments)    Burning feeling. Has left mark on skin.   Belviq [Lorcaserin Hcl] Other (See Comments)    headache   Dicyclomine Nausea And Vomiting   Contrave [Naltrexone-Bupropion Hcl Er] Anxiety    Altered mental state     Past Medical History:  Diagnosis Date   Acute pulmonary embolism (HCC)    Anemia    Dizziness    Fibroid uterus    GERD (gastroesophageal reflux disease)    History of blood transfusion 02/26/2021   History of thrombocytosis    History of uterine fibroid 03/14/2014   myomectomy   IBS (irritable bowel syndrome)    Iron deficiency anemia  03/14/2014   Migraine    Polyp of stomach 01/06/2016   Stomach polyp, pyloric, consistent with prolapse.    Routine screening for STI (sexually transmitted infection) 11/15/2021    Review of systems:  Otherwise negative.    Physical Exam  Gen: Alert, oriented. Appears stated age.  HEENT: PERRLA. Lungs: No respiratory distress CV: RRR Abd: soft, benign, no masses Ext: No edema    Planned procedures: Proceed with EGD/colonoscopy. The patient understands the nature of the planned procedure, indications, risks, alternatives and potential complications including but not limited to bleeding,  infection, perforation, damage to internal organs and possible oversedation/side effects from anesthesia. The patient agrees and gives consent to proceed.  Please refer to procedure notes for findings, recommendations and patient disposition/instructions.     Sharon Rubenstein, MD [facility] Gastroenterology

## 2022-09-12 NOTE — Op Note (Signed)
[facility] Gastroenterology Patient Name: Sharon Herring Procedure Date: 09/12/2022 9:54 AM MRN: [id number] Account #: [id number] Date of Birth: 22-May-1976 Admit Type: Outpatient Age: 47 Room: [facility] ENDO ROOM 3 Gender: Female Note Status: Finalized Instrument Name: Park Meo [id number] Procedure:             Colonoscopy Indications:           Iron deficiency anemia Providers:             Andrey Farmer MD, MD Medicines:             Monitored Anesthesia Care Complications:         No immediate complications. Procedure:             Pre-Anesthesia Assessment:                        - Prior to the procedure, a History and Physical was                         performed, and patient medications and allergies were                         reviewed. The patient is competent. The risks and                         benefits of the procedure and the sedation options and                         risks were discussed with the patient. All questions                         were answered and informed consent was obtained.                         Patient identification and proposed procedure were                         verified by the physician, the nurse, the                         anesthesiologist, the anesthetist and the technician                         in the endoscopy suite. Mental Status Examination:                         alert and oriented. Airway Examination: normal                         oropharyngeal airway and neck mobility. Respiratory                         Examination: clear to auscultation. CV Examination:                         normal. Prophylactic Antibiotics: The patient does not                         require prophylactic antibiotics. Prior  Anticoagulants: The patient has taken no anticoagulant                         or antiplatelet agents. ASA Grade Assessment: III - A                         patient with severe systemic  disease. After reviewing                         the risks and benefits, the patient was deemed in                         satisfactory condition to undergo the procedure. The                         anesthesia plan was to use monitored anesthesia care                         (MAC). Immediately prior to administration of                         medications, the patient was re-assessed for adequacy                         to receive sedatives. The heart rate, respiratory                         rate, oxygen saturations, blood pressure, adequacy of                         pulmonary ventilation, and response to care were                         monitored throughout the procedure. The physical                         status of the patient was re-assessed after the                         procedure.                        After obtaining informed consent, the colonoscope was                         passed under direct vision. Throughout the procedure,                         the patient's blood pressure, pulse, and oxygen                         saturations were monitored continuously. The                         Colonoscope was introduced through the anus and                         advanced to the the cecum, identified by appendiceal  orifice and ileocecal valve. The colonoscopy was                         somewhat difficult due to significant looping. The                         patient tolerated the procedure well. The quality of                         the bowel preparation was good. The ileocecal valve,                         appendiceal orifice, and rectum were photographed. Findings:      The perianal and digital rectal examinations were normal.      A single small-mouthed diverticulum was found in the cecum.      Internal hemorrhoids were found during retroflexion. The hemorrhoids       were Grade I (internal hemorrhoids that do not prolapse).      The exam was  otherwise without abnormality on direct and retroflexion       views. Impression:            - Diverticulosis in the cecum.                        - Internal hemorrhoids.                        - The examination was otherwise normal on direct and                         retroflexion views.                        - No specimens collected. Recommendation:        - Discharge patient to home.                        - Resume previous diet.                        - Continue present medications.                        - Repeat colonoscopy in 10 years for screening                         purposes.                        - Return to referring physician as previously                         scheduled. Procedure Code(s):     --- Professional ---                        [phone], Colonoscopy, flexible; diagnostic, including                         collection of specimen(s) by brushing or washing, when  performed (separate procedure) Diagnosis Code(s):     --- Professional ---                        K64.0, First degree hemorrhoids                        D50.9, Iron deficiency anemia, unspecified                        K57.30, Diverticulosis of large intestine without                         perforation or abscess without bleeding CPT copyright 2022 [employer]. All rights reserved. The codes documented in this report are preliminary and upon coder review may  be revised to meet current compliance requirements. Andrey Farmer MD, MD 09/12/2022 10:37:15 AM Number of Addenda: 0 Note Initiated On: 09/12/2022 9:54 AM Scope Withdrawal Time: 0 hours 11 minutes 54 seconds  Total Procedure Duration: 0 hours 16 minutes 26 seconds  Estimated Blood Loss:  Estimated blood loss: none.      [facility]

## 2022-09-12 NOTE — Op Note (Signed)
[facility] Gastroenterology Patient Name: Sharon Herring Procedure Date: 09/12/2022 9:55 AM MRN: [id number] Account #: [id number] Date of Birth: [birth year] Admit Type: Outpatient Age: 47 Room: [facility] ENDO ROOM 3 Gender: Female Note Status: Finalized Instrument Name: Upper Endoscope [id number] Procedure:             Upper GI endoscopy Indications:           Iron deficiency anemia Providers:             Andrey Farmer MD, MD Medicines:             Monitored Anesthesia Care Complications:         No immediate complications. Estimated blood loss:                         Minimal. Procedure:             Pre-Anesthesia Assessment:                        - Prior to the procedure, a History and Physical was                         performed, and patient medications and allergies were                         reviewed. The patient is competent. The risks and                         benefits of the procedure and the sedation options and                         risks were discussed with the patient. All questions                         were answered and informed consent was obtained.                         Patient identification and proposed procedure were                         verified by the physician, the nurse, the                         anesthesiologist, the anesthetist and the technician                         in the endoscopy suite. Mental Status Examination:                         alert and oriented. Airway Examination: normal                         oropharyngeal airway and neck mobility. Respiratory                         Examination: clear to auscultation. CV Examination:                         normal. Prophylactic Antibiotics: The patient does not  require prophylactic antibiotics. Prior                         Anticoagulants: The patient has taken no anticoagulant                         or antiplatelet agents. ASA Grade Assessment:  III - A                         patient with severe systemic disease. After reviewing                         the risks and benefits, the patient was deemed in                         satisfactory condition to undergo the procedure. The                         anesthesia plan was to use monitored anesthesia care                         (MAC). Immediately prior to administration of                         medications, the patient was re-assessed for adequacy                         to receive sedatives. The heart rate, respiratory                         rate, oxygen saturations, blood pressure, adequacy of                         pulmonary ventilation, and response to care were                         monitored throughout the procedure. The physical                         status of the patient was re-assessed after the                         procedure.                        After obtaining informed consent, the endoscope was                         passed under direct vision. Throughout the procedure,                         the patient's blood pressure, pulse, and oxygen                         saturations were monitored continuously. The Endoscope                         was introduced through the mouth, and advanced to the  second part of duodenum. The upper GI endoscopy was                         accomplished without difficulty. The patient tolerated                         the procedure well. Findings:      The examined esophagus was normal.      The entire examined stomach was normal. Biopsies were taken with a cold       forceps for Helicobacter pylori testing. Estimated blood loss was       minimal.      The examined duodenum was normal. Impression:            - Normal esophagus.                        - Normal stomach. Biopsied.                        - Normal examined duodenum. Recommendation:        - Await pathology results.                         - Perform a colonoscopy today. Procedure Code(s):     --- Professional ---                        [phone], Esophagogastroduodenoscopy, flexible,                         transoral; with biopsy, single or multiple Diagnosis Code(s):     --- Professional ---                        D50.9, Iron deficiency anemia, unspecified CPT copyright 2022 American [employer] Association. All rights reserved. The codes documented in this report are preliminary and upon coder review may  be revised to meet current compliance requirements. Andrey Farmer MD, MD 09/12/2022 10:34:57 AM Number of Addenda: 0 Note Initiated On: 09/12/2022 9:55 AM Estimated Blood Loss:  Estimated blood loss was minimal.      [facility]

## 2022-09-12 NOTE — Anesthesia Postprocedure Evaluation (Signed)
Anesthesia Post Note  Patient: Sharon Herring  Procedure(s) Performed: ESOPHAGOGASTRODUODENOSCOPY (EGD) WITH PROPOFOL COLONOSCOPY WITH PROPOFOL  Patient location during evaluation: PACU Anesthesia Type: General Level of consciousness: awake Pain management: satisfactory to patient Vital Signs Assessment: post-procedure vital signs reviewed and stable Respiratory status: spontaneous breathing and respiratory function stable Cardiovascular status: stable Anesthetic complications: no  No notable events documented.   Last Vitals:  Vitals:   09/12/22 1037 09/12/22 1046  BP: 118/84 (!) 136/99  Pulse:    Resp:    Temp: (!) 36.3 C   SpO2:  99%    Last Pain:  Vitals:   09/12/22 1046  TempSrc:   PainSc: 0-No pain                 VAN STAVEREN,Zamya Culhane

## 2022-09-12 NOTE — Anesthesia Preprocedure Evaluation (Signed)
Anesthesia Evaluation  Patient identified by MRN, date of birth, ID band Patient awake    Reviewed: Allergy & Precautions, NPO status , Patient's Chart, lab work & pertinent test results  Airway Mallampati: II  TM Distance: >3 FB Neck ROM: full    Dental  (+) Partial Upper   Pulmonary neg pulmonary ROS, sleep apnea  PE two years ago   Pulmonary exam normal breath sounds clear to auscultation       Cardiovascular Exercise Tolerance: Good negative cardio ROS Normal cardiovascular exam Rhythm:Regular Rate:Normal     Neuro/Psych  Headaches negative neurological ROS  negative psych ROS   GI/Hepatic negative GI ROS, Neg liver ROS, hiatal hernia,GERD  Medicated,,  Endo/Other  negative endocrine ROS    Renal/GU negative Renal ROS  negative genitourinary   Musculoskeletal   Abdominal  (+) + obese  Peds negative pediatric ROS (+)  Hematology negative hematology ROS (+) Blood dyscrasia, anemia   Anesthesia Other Findings Past Medical History: No date: Acute pulmonary embolism (HCC) No date: Anemia No date: Dizziness No date: Fibroid uterus No date: GERD (gastroesophageal reflux disease) 02/26/2021: History of blood transfusion No date: History of thrombocytosis 03/14/2014: History of uterine fibroid     Comment:  myomectomy No date: IBS (irritable bowel syndrome) 03/14/2014: Iron deficiency anemia No date: Migraine 01/06/2016: Polyp of stomach     Comment:  Stomach polyp, pyloric, consistent with prolapse.  11/15/2021: Routine screening for STI (sexually transmitted infection)  Past Surgical History: 2012: APPENDECTOMY No date: CHOLECYSTECTOMY 02/18/2021: CHROMOPERTUBATION; N/A     Comment:  Procedure: CHROMOPERTUBATION;  Surgeon: Rubie Maid,               MD;  Location: [facility] ORS;  Service: Gynecology;                Laterality: N/A; 01/06/2016: ESOPHAGOGASTRODUODENOSCOPY (EGD) WITH PROPOFOL; N/A     Comment:   Procedure: ESOPHAGOGASTRODUODENOSCOPY (EGD) WITH               PROPOFOL;  Surgeon: Robert Bellow, MD;  Location:               [facility] ENDOSCOPY;  Service: Endoscopy;  Laterality: N/A; No date: HERNIA REPAIR 11/02/2021: IR FLUORO GUIDED NEEDLE PLC ASPIRATION/INJECTION LOC 02/18/2021: ROBOT ASSISTED MYOMECTOMY; N/A     Comment:  Procedure: XI ROBOTIC ASSISTED MYOMECTOMY;  Surgeon:               Rubie Maid, MD;  Location: [facility] ORS;  Service:               Gynecology;  Laterality: N/A; 03/03/2016: ROBOTIC ASSISTED LAPAROSCOPIC CHOLECYSTECTOMY; N/A     Comment:  Procedure: ROBOTIC ASSISTED LAPAROSCOPIC CHOLECYSTECTOMY              ;  Surgeon: Clayburn Pert, MD;  Location: [facility] ORS;                Service: General;  Laterality: N/A; 2012: UTERINE FIBROID SURGERY 07/15/2019: XI ROBOTIC ASSISTED VENTRAL HERNIA; N/A     Comment:  Procedure: XI ROBOTIC ASSISTED VENTRAL HERNIA;  Surgeon:              Jules Husbands, MD;  Location: [facility] ORS;  Service:               General;  Laterality: N/A;  BMI    Body Mass Index: 36.28 kg/m      Reproductive/Obstetrics negative OB ROS  Anesthesia Physical Anesthesia Plan  ASA: 3  Anesthesia Plan: General   Post-op Pain Management:    Induction: Intravenous  PONV Risk Score and Plan: Propofol infusion and TIVA  Airway Management Planned: Natural Airway  Additional Equipment:   Intra-op Plan:   Post-operative Plan:   Informed Consent: I have reviewed the patients History and Physical, chart, labs and discussed the procedure including the risks, benefits and alternatives for the proposed anesthesia with the patient or authorized representative who has indicated his/her understanding and acceptance.     Dental Advisory Given  Plan Discussed with: CRNA and Surgeon  Anesthesia Plan Comments:        Anesthesia Quick Evaluation

## 2022-09-13 ENCOUNTER — Encounter: Payer: Self-pay | Admitting: Gastroenterology

## 2022-09-13 ENCOUNTER — Inpatient Hospital Stay: Payer: Managed Care, Other (non HMO)

## 2022-09-13 VITALS — BP 131/87 | HR 79 | Temp 96.7°F | Resp 18

## 2022-09-13 DIAGNOSIS — D509 Iron deficiency anemia, unspecified: Secondary | ICD-10-CM | POA: Diagnosis not present

## 2022-09-13 MED ORDER — SODIUM CHLORIDE 0.9 % IV SOLN
200.0000 mg | INTRAVENOUS | Status: DC
  Administered 2022-09-13: 200 mg via INTRAVENOUS
  Filled 2022-09-13: qty 200

## 2022-09-13 MED ORDER — SODIUM CHLORIDE 0.9 % IV SOLN
INTRAVENOUS | Status: DC | PRN
  Filled 2022-09-13: qty 250

## 2022-09-13 NOTE — Progress Notes (Signed)
Patient declined to wait the 30 minutes post infusion observation period today.

## 2022-09-14 ENCOUNTER — Inpatient Hospital Stay: Payer: Managed Care, Other (non HMO)

## 2022-09-14 LAB — SURGICAL PATHOLOGY

## 2022-09-19 NOTE — Patient Instructions (Incomplete)

## 2022-09-19 NOTE — Progress Notes (Deleted)
GYNECOLOGY ANNUAL PHYSICAL EXAM PROGRESS NOTE  Subjective:    Sharon Herring is a 47 y.o. G61P0010 female who presents for an annual exam. The patient has no complaints today. The patient {is/is not/has never been:13135} sexually active. The patient participates in regular exercise: {yes/no/not asked:9010}. Has the patient ever been transfused or tattooed?: {yes/no/not asked:9010}. The patient reports that there {is/is not:9024} domestic violence in her life.    Menstrual History: Menarche age: *** No LMP recorded.     Gynecologic History:  Contraception: {method:5051} History of STI's:  Last Pap: 01/22/2019. Results were: abnormal.  Notes h/o abnormal pap smears. Last mammogram: 01/01/2018. Results were: abnormal       OB History  Gravida Para Term Preterm AB Living  1 0 0 0 1 0  SAB IAB Ectopic Multiple Live Births  1 0 0 0 0    # Outcome Date GA Lbr Len/2nd Weight Sex Delivery Anes PTL Lv  1 SAB 2022            Obstetric Comments  1st Menstrual Cycle:  15        Past Medical History:  Diagnosis Date   Acute pulmonary embolism (HCC)    Anemia    Dizziness    Fibroid uterus    GERD (gastroesophageal reflux disease)    History of blood transfusion 02/26/2021   History of thrombocytosis    History of uterine fibroid 03/14/2014   myomectomy   IBS (irritable bowel syndrome)    Iron deficiency anemia 03/14/2014   Migraine    Polyp of stomach 01/06/2016   Stomach polyp, pyloric, consistent with prolapse.    Routine screening for STI (sexually transmitted infection) 11/15/2021    Past Surgical History:  Procedure Laterality Date   APPENDECTOMY  2012   CHOLECYSTECTOMY     CHROMOPERTUBATION N/A 02/18/2021   Procedure: CHROMOPERTUBATION;  Surgeon: Rubie Maid, MD;  Location: [facility];  Service: Gynecology;  Laterality: N/A;   COLONOSCOPY WITH PROPOFOL N/A 09/12/2022   Procedure: COLONOSCOPY WITH PROPOFOL;  Surgeon: Lesly Rubenstein, MD;  Location: [facility]  ENDOSCOPY;  Service: Endoscopy;  Laterality: N/A;   ESOPHAGOGASTRODUODENOSCOPY (EGD) WITH PROPOFOL N/A 01/06/2016   Procedure: ESOPHAGOGASTRODUODENOSCOPY (EGD) WITH PROPOFOL;  Surgeon: Robert Bellow, MD;  Location: [facility];  Service: Endoscopy;  Laterality: N/A;   ESOPHAGOGASTRODUODENOSCOPY (EGD) WITH PROPOFOL N/A 09/12/2022   Procedure: ESOPHAGOGASTRODUODENOSCOPY (EGD) WITH PROPOFOL;  Surgeon: Lesly Rubenstein, MD;  Location: [facility];  Service: Endoscopy;  Laterality: N/A;   HERNIA REPAIR     IR FLUORO GUIDED NEEDLE PLC ASPIRATION/INJECTION LOC  11/02/2021   ROBOT ASSISTED MYOMECTOMY N/A 02/18/2021   Procedure: XI ROBOTIC ASSISTED MYOMECTOMY;  Surgeon: Rubie Maid, MD;  Location: [facility];  Service: Gynecology;  Laterality: N/A;   ROBOTIC ASSISTED LAPAROSCOPIC CHOLECYSTECTOMY N/A 03/03/2016   Procedure: ROBOTIC ASSISTED LAPAROSCOPIC CHOLECYSTECTOMY ;  Surgeon: Clayburn Pert, MD;  Location: [facility];  Service: General;  Laterality: N/A;   UTERINE FIBROID SURGERY  2012   XI ROBOTIC ASSISTED VENTRAL HERNIA N/A 07/15/2019   Procedure: XI ROBOTIC ASSISTED VENTRAL HERNIA;  Surgeon: Jules Husbands, MD;  Location: [facility];  Service: General;  Laterality: N/A;    Family History  Problem Relation Age of Onset   Hypertension Mother    Hypertension Father    Pancreatic cancer Father 23   Breast cancer Neg Hx    Colon cancer Neg Hx     Social History   Socioeconomic History   Marital status: Single  Spouse name: Not on file   Number of children: 0   Years of education: Not on file   Highest education level: Bachelor's degree (e.g., BA, AB, BS)  Occupational History   Occupation: CMA  Tobacco Use   Smoking status: Never   Smokeless tobacco: Never  Vaping Use   Vaping Use: Never used  Substance and Sexual Activity   Alcohol use: No    Alcohol/week: 0.0 standard drinks of alcohol   Drug use: No   Sexual activity: Not Currently    Partners: Male    Birth  control/protection: None  Other Topics Concern   Not on file  Social History Narrative   Lives alone, works at [employer] Strain: [place]  (04/15/2021)   Overall Financial Resource Strain (CARDIA)    Difficulty of Paying Living Expenses: Not hard at all  Food Insecurity: No Food Insecurity (04/15/2021)   Hunger Vital Sign    Worried About Running Out of Food in the Last Year: Never true    [place] in the Last Year: Never true  Transportation Needs: No Transportation Needs (04/15/2021)   PRAPARE - Hydrologist (Medical): No    Lack of Transportation (Non-Medical): No  Physical Activity: Insufficiently Active (04/15/2021)   Exercise Vital Sign    Days of Exercise per Week: 2 days    Minutes of Exercise per Session: 30 min  Stress: No Stress Concern Present (04/15/2021)   [place]    Feeling of Stress : Only a little  Social Connections: Socially Integrated (04/15/2021)   Social Connection and Isolation Panel [NHANES]    Frequency of Communication with Friends and Family: More than three times a week    Frequency of Social Gatherings with Friends and Family: More than three times a week    Attends Religious Services: More than 4 times per year    Active Member of [employer] or Organizations: Yes    Attends Archivist Meetings: 1 to 4 times per year    Marital Status: Married  Human resources officer Violence: Not At Risk (04/15/2021)   Humiliation, Afraid, Rape, and Kick questionnaire    Fear of Current or Ex-Partner: No    Emotionally Abused: No    Physically Abused: No    Sexually Abused: No    Current Outpatient Medications on File Prior to Visit  Medication Sig Dispense Refill   acetaminophen (TYLENOL) 325 MG tablet Take 2 tablets (650 mg total) by mouth every 4 (four) hours as needed for mild pain or fever. (Patient not  taking: Reported on 06/08/2022)     albuterol (VENTOLIN HFA) 108 (90 Base) MCG/ACT inhaler Inhale 2 puffs into the lungs every 6 (six) hours as needed for wheezing or shortness of breath. (Patient not taking: Reported on 06/08/2022) 18 g 0   apixaban (ELIQUIS) 5 MG TABS tablet Take 1 tablet (5 mg total) by mouth 2 (two) times daily. (Patient not taking: Reported on 06/08/2022) 60 tablet 4   ferrous sulfate (FERROUSUL) 325 (65 FE) MG tablet Take 1 tablet (325 mg total) by mouth 3 (three) times daily with meals. (Patient not taking: Reported on 06/08/2022) 90 tablet 1   furosemide (LASIX) 20 MG tablet Take 20 mg by mouth.     HYDROmorphone (DILAUDID) 2 MG tablet Take 0.5 tablets (1 mg total) by mouth every 12 (twelve) hours as  needed for severe pain. 10 tablet 0   ibuprofen (ADVIL) 200 MG tablet Take 1 tablet (200 mg total) by mouth every 6 (six) hours as needed for moderate pain.     iron polysaccharides (NIFEREX) 150 MG capsule Take by mouth.     letrozole (FEMARA) 2.5 MG tablet Take 2.5 mg by mouth as directed. (Patient not taking: Reported on 06/08/2022)     leuprolide (LUPRON DEPOT, 69-MONTH,) 11.25 MG injection Inject 11.25 mg into the muscle every 3 (three) months. (Patient not taking: Reported on 06/08/2022) 1 each 1   Multiple Vitamins-Minerals (MULTIVITAMIN ADULT) TABS Take 1 tablet by mouth daily. 30 tablet 0   predniSONE (DELTASONE) 2.5 MG tablet Take 2.5 mg by mouth daily with breakfast.     [DISCONTINUED] RIVAROXABAN (XARELTO) VTE STARTER PACK (15 & 20 MG) Follow package directions: Take one '15mg'$  tablet by mouth twice a day. On day 22, switch to one '20mg'$  tablet once a day. Take with food. 51 each 0   Current Facility-Administered Medications on File Prior to Visit  Medication Dose Route Frequency Provider Last Rate Last Admin   cyanocobalamin ((VITAMIN B-12)) injection 1,000 mcg  1,000 mcg Intramuscular Once Steele Sizer, MD        Allergies  Allergen Reactions   Aspirin Nausea And  Vomiting   Norethindrone Shortness Of Breath and Rash   Oxycodone-Acetaminophen Rash and Shortness Of Breath   Lexapro [Escitalopram] Palpitations   Tape Other (See Comments)    Burning feeling. Has left mark on skin.   Belviq [Lorcaserin Hcl] Other (See Comments)    headache   Dicyclomine Nausea And Vomiting   Contrave [Naltrexone-Bupropion Hcl Er] Anxiety    Altered mental state     Review of Systems Constitutional: negative for chills, fatigue, fevers and sweats Eyes: negative for irritation, redness and visual disturbance Ears, nose, mouth, throat, and face: negative for hearing loss, nasal congestion, snoring and tinnitus Respiratory: negative for asthma, cough, sputum Cardiovascular: negative for chest pain, dyspnea, exertional chest pressure/discomfort, irregular heart beat, palpitations and syncope Gastrointestinal: negative for abdominal pain, change in bowel habits, nausea and vomiting Genitourinary: negative for abnormal menstrual periods, genital lesions, sexual problems and vaginal discharge, dysuria and urinary incontinence Integument/breast: negative for breast lump, breast tenderness and nipple discharge Hematologic/lymphatic: negative for bleeding and easy bruising Musculoskeletal:negative for back pain and muscle weakness Neurological: negative for dizziness, headaches, vertigo and weakness Endocrine: negative for diabetic symptoms including polydipsia, polyuria and skin dryness Allergic/Immunologic: negative for hay fever and urticaria      Objective:  There were no vitals taken for this visit. There is no height or weight on file to calculate BMI.    General Appearance:    Alert, cooperative, no distress, appears stated age  Head:    Normocephalic, without obvious abnormality, atraumatic  Eyes:    PERRL, conjunctiva/corneas clear, EOM's intact, both eyes  Ears:    Normal external ear canals, both ears  Nose:   Nares normal, septum midline, mucosa normal, no  drainage or sinus tenderness  Throat:   Lips, mucosa, and tongue normal; teeth and gums normal  Neck:   Supple, symmetrical, trachea midline, no adenopathy; thyroid: no enlargement/tenderness/nodules; no carotid bruit or JVD  Back:     Symmetric, no curvature, ROM normal, no CVA tenderness  Lungs:     Clear to auscultation bilaterally, respirations unlabored  Chest Wall:    No tenderness or deformity   Heart:    Regular rate and rhythm, S1 and S2  normal, no murmur, rub or gallop  Breast Exam:    No tenderness, masses, or nipple abnormality  Abdomen:     Soft, non-tender, bowel sounds active all four quadrants, no masses, no organomegaly.    Genitalia:    Pelvic:external genitalia normal, vagina without lesions, discharge, or tenderness, rectovaginal septum  normal. Cervix normal in appearance, no cervical motion tenderness, no adnexal masses or tenderness.  Uterus normal size, shape, mobile, regular contours, nontender.  Rectal:    Normal external sphincter.  No hemorrhoids appreciated. Internal exam not done.   Extremities:   Extremities normal, atraumatic, no cyanosis or edema  Pulses:   2+ and symmetric all extremities  Skin:   Skin color, texture, turgor normal, no rashes or lesions  Lymph nodes:   Cervical, supraclavicular, and axillary nodes normal  Neurologic:   CNII-XII intact, normal strength, sensation and reflexes throughout   .  Labs:  Lab Results  Component Value Date   WBC 4.4 08/08/2022   HGB 10.5 (L) 08/08/2022   HCT 34.4 (L) 08/08/2022   MCV 74.3 (L) 08/08/2022   PLT 527 (H) 08/08/2022    Lab Results  Component Value Date   CREATININE 0.73 12/15/2021   BUN 9 12/15/2021   NA 138 12/15/2021   K 4.3 12/15/2021   CL 104 12/15/2021   CO2 28 12/15/2021    Lab Results  Component Value Date   ALT 8 12/15/2021   AST 9 (L) 12/15/2021   ALKPHOS 70 11/02/2021   BILITOT 0.2 12/15/2021    Lab Results  Component Value Date   TSH 2.210 01/13/2021     Assessment:    1. Encounter for well woman exam with routine gynecological exam   2. Hyperglycemia   3. Vitamin D deficiency   4. Screening cholesterol level   5. Screening for diabetes mellitus (DM)   6. Cervical cancer screening      Plan:  Blood tests: Ordered. Breast self exam technique reviewed and patient encouraged to perform self-exam monthly. Contraception: {contraceptive methods:5051}. Discussed healthy lifestyle modifications. Mammogram ordered Pap smear ordered. COVID vaccination status: Follow up in 1 year for annual exam   Chilton Greathouse, [facility]

## 2022-09-20 ENCOUNTER — Ambulatory Visit: Payer: Managed Care, Other (non HMO) | Admitting: Obstetrics and Gynecology

## 2022-09-20 DIAGNOSIS — R928 Other abnormal and inconclusive findings on diagnostic imaging of breast: Secondary | ICD-10-CM

## 2022-09-20 DIAGNOSIS — E559 Vitamin D deficiency, unspecified: Secondary | ICD-10-CM

## 2022-09-20 DIAGNOSIS — Z131 Encounter for screening for diabetes mellitus: Secondary | ICD-10-CM

## 2022-09-20 DIAGNOSIS — Z124 Encounter for screening for malignant neoplasm of cervix: Secondary | ICD-10-CM

## 2022-09-20 DIAGNOSIS — R739 Hyperglycemia, unspecified: Secondary | ICD-10-CM

## 2022-09-20 DIAGNOSIS — Z13220 Encounter for screening for lipoid disorders: Secondary | ICD-10-CM

## 2022-09-20 DIAGNOSIS — Z01419 Encounter for gynecological examination (general) (routine) without abnormal findings: Secondary | ICD-10-CM

## 2022-09-22 ENCOUNTER — Inpatient Hospital Stay: Payer: Managed Care, Other (non HMO) | Attending: Oncology

## 2022-09-22 VITALS — BP 141/93 | HR 84 | Temp 98.0°F | Resp 16

## 2022-09-22 DIAGNOSIS — Z79899 Other long term (current) drug therapy: Secondary | ICD-10-CM | POA: Insufficient documentation

## 2022-09-22 DIAGNOSIS — D509 Iron deficiency anemia, unspecified: Secondary | ICD-10-CM | POA: Insufficient documentation

## 2022-09-22 MED ORDER — SODIUM CHLORIDE 0.9 % IV SOLN
200.0000 mg | INTRAVENOUS | Status: DC
  Administered 2022-09-22: 200 mg via INTRAVENOUS
  Filled 2022-09-22: qty 200

## 2022-09-22 MED ORDER — SODIUM CHLORIDE 0.9 % IV SOLN
INTRAVENOUS | Status: DC
  Filled 2022-09-22: qty 250

## 2022-09-22 NOTE — Progress Notes (Signed)
Patient refused to stay for her 30 minute observation after Venofer infusion. Vital signs stable at discharge

## 2022-10-07 ENCOUNTER — Other Ambulatory Visit: Payer: Self-pay | Admitting: Internal Medicine

## 2022-10-07 ENCOUNTER — Telehealth (HOSPITAL_COMMUNITY): Payer: Self-pay | Admitting: *Deleted

## 2022-10-07 ENCOUNTER — Other Ambulatory Visit: Payer: Self-pay

## 2022-10-07 ENCOUNTER — Encounter (HOSPITAL_COMMUNITY): Payer: Self-pay

## 2022-10-07 DIAGNOSIS — R9439 Abnormal result of other cardiovascular function study: Secondary | ICD-10-CM

## 2022-10-07 MED ORDER — METOPROLOL TARTRATE 50 MG PO TABS
ORAL_TABLET | ORAL | 0 refills | Status: DC
  Filled 2022-10-07: qty 1, 1d supply, fill #0

## 2022-10-07 NOTE — Telephone Encounter (Signed)
Reaching out to patient to offer assistance regarding upcoming cardiac imaging study; pt verbalizes understanding of appt date/time, parking situation and where to check in, pre-test NPO status and medications ordered, and verified current allergies; name and call back number provided for further questions should they arise  Gordy Clement RN Navigator [facility] [phone] office [phone] cell  Patient to take 22m metoprolol tartrate two  hours prior to her cardiac CT scan.

## 2022-10-10 ENCOUNTER — Ambulatory Visit
Admission: RE | Admit: 2022-10-10 | Discharge: 2022-10-10 | Disposition: A | Discharge status: Home / Self Care | Payer: Managed Care, Other (non HMO) | Source: Ambulatory Visit | Attending: Internal Medicine | Admitting: Internal Medicine

## 2022-10-10 DIAGNOSIS — R9439 Abnormal result of other cardiovascular function study: Secondary | ICD-10-CM | POA: Insufficient documentation

## 2022-10-10 MED ORDER — IOHEXOL 350 MG/ML SOLN
100.0000 mL | Freq: Once | INTRAVENOUS | Status: AC | PRN
  Administered 2022-10-10: 100 mL via INTRAVENOUS

## 2022-10-10 MED ORDER — NITROGLYCERIN 0.4 MG SL SUBL
0.8000 mg | SUBLINGUAL_TABLET | Freq: Once | SUBLINGUAL | Status: AC
  Administered 2022-10-10: 0.8 mg via SUBLINGUAL

## 2022-10-10 NOTE — Progress Notes (Signed)
Patient tolerated procedure well. Ambulate w/o difficulty. Denies any lightheadedness or being dizzy. Pt denies any pain at this time. Sitting in chair, pt is encouraged to drink additional water throughout the day and reason explained to patient. Patient verbalized understanding and all questions answered. ABC intact. No further needs at this time. Discharge from procedure area w/o issues.  

## 2022-10-27 ENCOUNTER — Encounter: Payer: Self-pay | Admitting: Oncology

## 2022-11-03 ENCOUNTER — Inpatient Hospital Stay: Admission: RE | Admit: 2022-11-03 | Payer: Managed Care, Other (non HMO) | Source: Ambulatory Visit

## 2022-11-07 ENCOUNTER — Inpatient Hospital Stay: Payer: Managed Care, Other (non HMO) | Attending: Oncology

## 2022-11-30 ENCOUNTER — Other Ambulatory Visit: Payer: Self-pay

## 2022-12-15 ENCOUNTER — Other Ambulatory Visit: Payer: Self-pay

## 2022-12-15 ENCOUNTER — Encounter: Payer: Self-pay | Admitting: Pharmacist

## 2022-12-15 ENCOUNTER — Encounter: Payer: Self-pay | Admitting: Hematology and Oncology

## 2022-12-15 MED ORDER — ZEPBOUND 2.5 MG/0.5ML ~~LOC~~ SOAJ
2.5000 mg | SUBCUTANEOUS | 0 refills | Status: DC
  Filled 2022-12-15: qty 2, 28d supply, fill #0

## 2022-12-30 ENCOUNTER — Other Ambulatory Visit: Payer: Self-pay

## 2022-12-30 MED ORDER — ZEPBOUND 5 MG/0.5ML ~~LOC~~ SOAJ
5.0000 mg | SUBCUTANEOUS | 0 refills | Status: DC
  Filled 2022-12-30 – 2023-01-11 (×2): qty 2, 28d supply, fill #0

## 2023-01-10 ENCOUNTER — Other Ambulatory Visit: Payer: Self-pay

## 2023-01-11 ENCOUNTER — Other Ambulatory Visit: Payer: Self-pay

## 2023-01-18 ENCOUNTER — Other Ambulatory Visit: Payer: Self-pay

## 2023-01-18 MED ORDER — LINZESS 290 MCG PO CAPS
290.0000 ug | ORAL_CAPSULE | Freq: Every day | ORAL | 5 refills | Status: DC
  Filled 2023-01-18: qty 30, 30d supply, fill #0
  Filled 2023-10-25: qty 30, 30d supply, fill #1

## 2023-01-19 ENCOUNTER — Other Ambulatory Visit: Payer: Self-pay

## 2023-01-19 ENCOUNTER — Encounter: Payer: Self-pay | Admitting: Hematology and Oncology

## 2023-02-03 ENCOUNTER — Other Ambulatory Visit: Payer: Self-pay

## 2023-02-03 MED ORDER — ZEPBOUND 5 MG/0.5ML ~~LOC~~ SOAJ
5.0000 mg | SUBCUTANEOUS | 0 refills | Status: DC
  Filled 2023-02-03: qty 2, 28d supply, fill #0

## 2023-02-06 ENCOUNTER — Other Ambulatory Visit: Payer: Self-pay

## 2023-02-06 ENCOUNTER — Encounter: Payer: Self-pay | Admitting: Hematology and Oncology

## 2023-02-06 MED ORDER — MOUNJARO 7.5 MG/0.5ML ~~LOC~~ SOAJ
7.5000 mg | SUBCUTANEOUS | 0 refills | Status: DC
  Filled 2023-02-06: qty 2, 28d supply, fill #0

## 2023-02-07 ENCOUNTER — Other Ambulatory Visit: Payer: Self-pay

## 2023-02-07 ENCOUNTER — Inpatient Hospital Stay: Payer: Managed Care, Other (non HMO)

## 2023-02-07 ENCOUNTER — Inpatient Hospital Stay: Payer: Managed Care, Other (non HMO) | Admitting: Oncology

## 2023-02-08 ENCOUNTER — Other Ambulatory Visit: Payer: Self-pay

## 2023-02-08 MED ORDER — ZEPBOUND 7.5 MG/0.5ML ~~LOC~~ SOAJ
7.5000 mg | SUBCUTANEOUS | 0 refills | Status: DC
  Filled 2023-02-08: qty 2, 28d supply, fill #0

## 2023-02-09 ENCOUNTER — Other Ambulatory Visit: Payer: Self-pay

## 2023-03-03 ENCOUNTER — Other Ambulatory Visit: Payer: Self-pay

## 2023-03-03 MED ORDER — ZEPBOUND 10 MG/0.5ML ~~LOC~~ SOAJ
10.0000 mg | SUBCUTANEOUS | 0 refills | Status: DC
  Filled 2023-03-03: qty 2, 28d supply, fill #0

## 2023-03-07 ENCOUNTER — Encounter: Payer: Self-pay | Admitting: Oncology

## 2023-03-07 ENCOUNTER — Inpatient Hospital Stay (HOSPITAL_BASED_OUTPATIENT_CLINIC_OR_DEPARTMENT_OTHER): Payer: Managed Care, Other (non HMO) | Admitting: Oncology

## 2023-03-07 ENCOUNTER — Inpatient Hospital Stay: Payer: Managed Care, Other (non HMO) | Attending: Oncology

## 2023-03-07 VITALS — BP 138/91 | HR 76 | Temp 96.7°F | Resp 18 | Ht 65.0 in | Wt 214.1 lb

## 2023-03-07 DIAGNOSIS — D500 Iron deficiency anemia secondary to blood loss (chronic): Secondary | ICD-10-CM

## 2023-03-07 DIAGNOSIS — D509 Iron deficiency anemia, unspecified: Secondary | ICD-10-CM

## 2023-03-07 LAB — CBC WITH DIFFERENTIAL/PLATELET
Abs Immature Granulocytes: 0.02 10*3/uL (ref 0.00–0.07)
Basophils Absolute: 0 10*3/uL (ref 0.0–0.1)
Basophils Relative: 0 %
Eosinophils Absolute: 0 10*3/uL (ref 0.0–0.5)
Eosinophils Relative: 1 %
HCT: 33.8 % — ABNORMAL LOW (ref 36.0–46.0)
Hemoglobin: 10.7 g/dL — ABNORMAL LOW (ref 12.0–15.0)
Immature Granulocytes: 0 %
Lymphocytes Relative: 37 %
Lymphs Abs: 2.1 10*3/uL (ref 0.7–4.0)
MCH: 24.4 pg — ABNORMAL LOW (ref 26.0–34.0)
MCHC: 31.7 g/dL (ref 30.0–36.0)
MCV: 77.2 fL — ABNORMAL LOW (ref 80.0–100.0)
Monocytes Absolute: 0.5 10*3/uL (ref 0.1–1.0)
Monocytes Relative: 10 %
Neutro Abs: 2.9 10*3/uL (ref 1.7–7.7)
Neutrophils Relative %: 52 %
Platelets: 462 10*3/uL — ABNORMAL HIGH (ref 150–400)
RBC: 4.38 MIL/uL (ref 3.87–5.11)
RDW: 15.5 % (ref 11.5–15.5)
WBC: 5.6 10*3/uL (ref 4.0–10.5)
nRBC: 0 % (ref 0.0–0.2)

## 2023-03-07 LAB — IRON AND TIBC
Iron: 40 ug/dL (ref 28–170)
Saturation Ratios: 9 % — ABNORMAL LOW (ref 10.4–31.8)
TIBC: 424 ug/dL (ref 250–450)
UIBC: 384 ug/dL

## 2023-03-07 LAB — FERRITIN: Ferritin: 7 ng/mL — ABNORMAL LOW (ref 11–307)

## 2023-03-07 NOTE — Progress Notes (Signed)
Hematology/Oncology Consult note [facility]  Telephone:([phone] [phone]  Patient Care Team: Alba Cory, MD as PCP - General (Family Medicine) Alba Cory, MD as Attending Physician (Family Medicine) Lemar Livings Merrily Pew, MD (General Surgery)   Name of the patient: Sharon Herring  [id number]  03-26-1976   Date of visit: 03/07/23  Diagnosis- 1. Iron deficiency anemia 2. Microcytic anemia  Chief complaint/ Reason for visit-routine follow-up of iron deficiency anemia  Heme/Onc history: Patient is a 47 year old female referred for iron deficiency anemia.  Her most recent CBC from 05/24/2022 showed an H&H of 9/31.3 with an MCV of 70.  Platelet count was elevated at 550 with a normal white count of 5.2.  Iron studies showed an elevated TIBC of 517.  Ferritin levels were low at 6. Patient has had an EGD by Dr. Lemar Livings back in May 2017 which was normal but showed 2 gastric polyps.  These were negative for dysplasia and malignancy.   Patient had surgery for uterine fibroids last year.  Since then she reports that her menstrual cycles have been much lighter and they continue for about 5 days.  She has had chronic anemia over the last 1 year with a hemoglobin that has been as low as 7 in the past.  Denies any blood loss in her stool or urine.  Denies any dark melanotic stools.  Denies any family history of colon cancer.  Denies any consistent use of NSAIDs Goody powder or BC powder.     Patient had EGD and colonoscopy in January 2024 which was unremarkable.  Menstrual cycles are not heavy.  Interval history-patient last received IV iron February 2024 and does report improvement in her fatigue after receiving IV iron.  Menstrual cycles are regular.  ECOG PS- 0 Pain scale- 0   Review of systems- Review of Systems  Constitutional:  Positive for malaise/fatigue. Negative for chills, fever and weight loss.  HENT:  Negative for congestion, ear  discharge and nosebleeds.   Eyes:  Negative for blurred vision.  Respiratory:  Negative for cough, hemoptysis, sputum production, shortness of breath and wheezing.   Cardiovascular:  Negative for chest pain, palpitations, orthopnea and claudication.  Gastrointestinal:  Negative for abdominal pain, blood in stool, constipation, diarrhea, heartburn, melena, nausea and vomiting.  Genitourinary:  Negative for dysuria, flank pain, frequency, hematuria and urgency.  Musculoskeletal:  Negative for back pain, joint pain and myalgias.  Skin:  Negative for rash.  Neurological:  Negative for dizziness, tingling, focal weakness, seizures, weakness and headaches.  Endo/Heme/Allergies:  Does not bruise/bleed easily.  Psychiatric/Behavioral:  Negative for depression and suicidal ideas. The patient does not have insomnia.       Allergies  Allergen Reactions   Aspirin Nausea And Vomiting   Norethindrone Shortness Of Breath and Rash   Oxycodone-Acetaminophen Rash and Shortness Of Breath   Lexapro [Escitalopram] Palpitations   Tape Other (See Comments)    Burning feeling. Has left mark on skin.   Belviq [Lorcaserin Hcl] Other (See Comments)    headache   Dicyclomine Nausea And Vomiting   Contrave [Naltrexone-Bupropion Hcl Er] Anxiety    Altered mental state     Past Medical History:  Diagnosis Date   Acute pulmonary embolism (HCC)    Anemia    Dizziness    Fibroid uterus    GERD (gastroesophageal reflux disease)    History of blood transfusion 02/26/2021   History of thrombocytosis    History of uterine fibroid 03/14/2014  myomectomy   IBS (irritable bowel syndrome)    Iron deficiency anemia 03/14/2014   Migraine    Polyp of stomach 01/06/2016   Stomach polyp, pyloric, consistent with prolapse.    Routine screening for STI (sexually transmitted infection) 11/15/2021     Past Surgical History:  Procedure Laterality Date   APPENDECTOMY  2012   CHOLECYSTECTOMY     CHROMOPERTUBATION N/A  02/18/2021   Procedure: CHROMOPERTUBATION;  Surgeon: Hildred Laser, MD;  Location: [facility];  Service: Gynecology;  Laterality: N/A;   COLONOSCOPY WITH PROPOFOL N/A 09/12/2022   Procedure: COLONOSCOPY WITH PROPOFOL;  Surgeon: Regis Bill, MD;  Location: [facility];  Service: Endoscopy;  Laterality: N/A;   ESOPHAGOGASTRODUODENOSCOPY (EGD) WITH PROPOFOL N/A 01/06/2016   Procedure: ESOPHAGOGASTRODUODENOSCOPY (EGD) WITH PROPOFOL;  Surgeon: Earline Mayotte, MD;  Location: [facility];  Service: Endoscopy;  Laterality: N/A;   ESOPHAGOGASTRODUODENOSCOPY (EGD) WITH PROPOFOL N/A 09/12/2022   Procedure: ESOPHAGOGASTRODUODENOSCOPY (EGD) WITH PROPOFOL;  Surgeon: Regis Bill, MD;  Location: [facility];  Service: Endoscopy;  Laterality: N/A;   HERNIA REPAIR     IR FLUORO GUIDED NEEDLE PLC ASPIRATION/INJECTION LOC  11/02/2021   ROBOT ASSISTED MYOMECTOMY N/A 02/18/2021   Procedure: XI ROBOTIC ASSISTED MYOMECTOMY;  Surgeon: Hildred Laser, MD;  Location: [facility];  Service: Gynecology;  Laterality: N/A;   ROBOTIC ASSISTED LAPAROSCOPIC CHOLECYSTECTOMY N/A 03/03/2016   Procedure: ROBOTIC ASSISTED LAPAROSCOPIC CHOLECYSTECTOMY ;  Surgeon: Ricarda Frame, MD;  Location: [facility];  Service: General;  Laterality: N/A;   UTERINE FIBROID SURGERY  2012   XI ROBOTIC ASSISTED VENTRAL HERNIA N/A 07/15/2019   Procedure: XI ROBOTIC ASSISTED VENTRAL HERNIA;  Surgeon: Leafy Ro, MD;  Location: [facility];  Service: General;  Laterality: N/A;    Social History   Socioeconomic History   Marital status: Single    Spouse name: Not on file   Number of children: 0   Years of education: Not on file   Highest education level: Bachelor's degree (e.g., BA, AB, BS)  Occupational History   Occupation: CMA  Tobacco Use   Smoking status: Never   Smokeless tobacco: Never  Vaping Use   Vaping status: Never Used  Substance and Sexual Activity   Alcohol use: No    Alcohol/week: 0.0 standard drinks of  alcohol   Drug use: No   Sexual activity: Not Currently    Partners: Male    Birth control/protection: None  Other Topics Concern   Not on file  Social History Narrative   Lives alone, works at [employer]    Social Determinants of Health   Financial Resource Strain: Low Risk  (04/15/2021)   Overall Financial Resource Strain (CARDIA)    Difficulty of Paying Living Expenses: Not hard at all  Food Insecurity: No Food Insecurity (04/15/2021)   Hunger Vital Sign    Worried About Running Out of Food in the Last Year: Never true    Ran Out of Food in the Last Year: Never true  Transportation Needs: No Transportation Needs (04/15/2021)   PRAPARE - Administrator, Civil Service (Medical): No    Lack of Transportation (Non-Medical): No  Physical Activity: Insufficiently Active (04/15/2021)   Exercise Vital Sign    Days of Exercise per Week: 2 days    Minutes of Exercise per Session: 30 min  Stress: No Stress Concern Present (04/15/2021)   [employer] of Occupational Health - Occupational Stress Questionnaire    Feeling of Stress : Only a little  Social  Connections: Socially Integrated (04/15/2021)   Social Connection and Isolation Panel [NHANES]    Frequency of Communication with Friends and Family: More than three times a week    Frequency of Social Gatherings with Friends and Family: More than three times a week    Attends Religious Services: More than 4 times per year    Active Member of [employer] or Organizations: Yes    Attends Banker Meetings: 1 to 4 times per year    Marital Status: Married  Catering manager Violence: Not At Risk (04/15/2021)   Humiliation, Afraid, Rape, and Kick questionnaire    Fear of Current or Ex-Partner: No    Emotionally Abused: No    Physically Abused: No    Sexually Abused: No    Family History  Problem Relation Age of Onset   Hypertension Mother    Hypertension Father    Pancreatic cancer Father 74   Breast cancer Neg Hx     Colon cancer Neg Hx      Current Outpatient Medications:    furosemide (LASIX) 20 MG tablet, Take 20 mg by mouth., Disp: , Rfl:    ibuprofen (ADVIL) 200 MG tablet, Take 1 tablet (200 mg total) by mouth every 6 (six) hours as needed for moderate pain., Disp: , Rfl:    iron polysaccharides (NIFEREX) 150 MG capsule, Take by mouth., Disp: , Rfl:    linaclotide (LINZESS) 290 MCG CAPS capsule, Take 1 capsule (290 mcg total) by mouth daily., Disp: 30 capsule, Rfl: 5   metoprolol tartrate (LOPRESSOR) 50 MG tablet, Take 1 tablet by mouth 2 hours prior to your cardiac CT scan., Disp: 1 tablet, Rfl: 0   Multiple Vitamins-Minerals (MULTIVITAMIN ADULT) TABS, Take 1 tablet by mouth daily., Disp: 30 tablet, Rfl: 0   predniSONE (DELTASONE) 2.5 MG tablet, Take 2.5 mg by mouth daily with breakfast., Disp: , Rfl:    tirzepatide (ZEPBOUND) 10 MG/0.5ML Pen, Inject 10 mg into the skin once a week., Disp: 2 mL, Rfl: 0   albuterol (VENTOLIN HFA) 108 (90 Base) MCG/ACT inhaler, Inhale 2 puffs into the lungs every 6 (six) hours as needed for wheezing or shortness of breath. (Patient not taking: Reported on 06/08/2022), Disp: 18 g, Rfl: 0   ferrous sulfate (FERROUSUL) 325 (65 FE) MG tablet, Take 1 tablet (325 mg total) by mouth 3 (three) times daily with meals. (Patient not taking: Reported on 06/08/2022), Disp: 90 tablet, Rfl: 1  Current Facility-Administered Medications:    cyanocobalamin ((VITAMIN B-12)) injection 1,000 mcg, 1,000 mcg, Intramuscular, Once, Alba Cory, MD  Physical exam:  Vitals:   03/07/23 1456  BP: (!) 138/91  Pulse: 76  Resp: 18  Temp: (!) 96.7 F (35.9 C)  TempSrc: Tympanic  SpO2: 100%  Weight: 214 lb 1.6 oz (97.1 kg)  Height: 5\' 5"  (1.651 m)   Physical Exam Cardiovascular:     Rate and Rhythm: Normal rate and regular rhythm.     Heart sounds: Normal heart sounds.  Pulmonary:     Effort: Pulmonary effort is normal.     Breath sounds: Normal breath sounds.  Skin:    General:  Skin is warm and dry.  Neurological:     Mental Status: She is alert and oriented to person, place, and time.         Latest Ref Rng & Units 12/15/2021    9:20 AM  CMP  Glucose 65 - 99 mg/dL 782   BUN 7 - 25 mg/dL 9   Creatinine  0.50 - 0.99 mg/dL 0.34   Sodium 742 - 595 mmol/L 138   Potassium 3.5 - 5.3 mmol/L 4.3   Chloride 98 - 110 mmol/L 104   CO2 20 - 32 mmol/L 28   Calcium 8.6 - 10.2 mg/dL 9.5   Total Protein 6.1 - 8.1 g/dL 7.2   Total Bilirubin 0.2 - 1.2 mg/dL 0.2   AST 10 - 30 U/L 9   ALT 6 - 29 U/L 8       Latest Ref Rng & Units 03/07/2023    2:48 PM  CBC  WBC 4.0 - 10.5 K/uL 5.6   Hemoglobin 12.0 - 15.0 g/dL 63.8   Hematocrit 75.6 - 46.0 % 33.8   Platelets 150 - 400 K/uL 462      Assessment and plan- Patient is a 47 y.o. female here for routine follow-up of iron deficiency anemia  GI workup for iron deficiency anemia so far has been unremarkable.  Menstrual cycles are not heavy.  She last received IV iron in February 2024.  Her hemoglobin despite IV iron at best has been between 10.5-11.5 with persistent microcytosis.  Iron studies from today are pending.  Given the persistent microcytosis I will check hemoglobin electrophoresis if it can be added on with today's labs or with next of labs in 3 months.  I will see her back in 6 months.  If there is continued iron deficiency I will only give her IV iron but also reach out to [facility] about consideration for capsule endoscopy.   Visit Diagnosis 1. Iron deficiency anemia, unspecified iron deficiency anemia type   2. Microcytic anemia      Dr. Owens Shark, MD, MPH [facility] [phone] 03/07/2023 3:44 PM

## 2023-03-09 ENCOUNTER — Telehealth: Payer: Self-pay | Admitting: *Deleted

## 2023-03-09 LAB — HGB FRACTIONATION CASCADE
Hgb A2: 2.3 % (ref 1.8–3.2)
Hgb A: 97.7 % (ref 96.4–98.8)
Hgb F: 0 % (ref 0.0–2.0)
Hgb S: 0 %

## 2023-03-09 NOTE — Telephone Encounter (Signed)
Called patient to let her know that her IV iron is very low and wanted to see if she would be okay with getting IV iron.  Patient says yes and she does not have any days that are good to be a problem so we can give her what ever days they have.  I told her we would check on the IV iron that she can get through her insurance and it is Venofer and she will be getting 5 infusions of iron.  [facility]agin will call her and tell her the dates.   I also sent a fax to Dr. Mia Creek for possible capsule endoscopy based on the IV iron seems to not be working. Suggested procedure.

## 2023-03-13 MED FILL — Iron Sucrose Inj 20 MG/ML (Fe Equiv): INTRAVENOUS | Qty: 10 | Status: AC

## 2023-03-14 ENCOUNTER — Inpatient Hospital Stay: Payer: Managed Care, Other (non HMO)

## 2023-03-15 MED FILL — Iron Sucrose Inj 20 MG/ML (Fe Equiv): INTRAVENOUS | Qty: 10 | Status: AC

## 2023-03-16 ENCOUNTER — Inpatient Hospital Stay: Payer: Managed Care, Other (non HMO)

## 2023-03-17 MED FILL — Iron Sucrose Inj 20 MG/ML (Fe Equiv): INTRAVENOUS | Qty: 10 | Status: AC

## 2023-03-20 ENCOUNTER — Other Ambulatory Visit: Payer: Self-pay | Admitting: Oncology

## 2023-03-20 ENCOUNTER — Inpatient Hospital Stay: Payer: Managed Care, Other (non HMO)

## 2023-03-20 VITALS — BP 124/89 | HR 76 | Resp 18

## 2023-03-21 MED FILL — Iron Sucrose Inj 20 MG/ML (Fe Equiv): INTRAVENOUS | Qty: 10 | Status: AC

## 2023-03-22 ENCOUNTER — Inpatient Hospital Stay: Payer: Managed Care, Other (non HMO)

## 2023-03-23 ENCOUNTER — Other Ambulatory Visit: Payer: Self-pay | Admitting: Nurse Practitioner

## 2023-03-23 MED FILL — Iron Sucrose Inj 20 MG/ML (Fe Equiv): INTRAVENOUS | Qty: 10 | Status: AC

## 2023-03-23 NOTE — Progress Notes (Signed)
Tx plan updated per pharmacist's request

## 2023-03-24 ENCOUNTER — Inpatient Hospital Stay: Payer: Managed Care, Other (non HMO)

## 2023-03-24 ENCOUNTER — Other Ambulatory Visit: Payer: Self-pay

## 2023-03-24 MED ORDER — PREDNISONE 20 MG PO TABS
20.0000 mg | ORAL_TABLET | Freq: Every day | ORAL | 0 refills | Status: DC
  Filled 2023-03-24: qty 5, 5d supply, fill #0

## 2023-03-29 ENCOUNTER — Inpatient Hospital Stay: Payer: Managed Care, Other (non HMO) | Attending: Oncology

## 2023-03-29 ENCOUNTER — Other Ambulatory Visit: Payer: Self-pay

## 2023-03-29 VITALS — BP 117/81 | HR 78 | Temp 97.0°F | Resp 18

## 2023-03-29 DIAGNOSIS — Z79899 Other long term (current) drug therapy: Secondary | ICD-10-CM | POA: Insufficient documentation

## 2023-03-29 DIAGNOSIS — D509 Iron deficiency anemia, unspecified: Secondary | ICD-10-CM

## 2023-03-29 MED ORDER — AZITHROMYCIN 250 MG PO TABS
ORAL_TABLET | ORAL | 0 refills | Status: DC
  Filled 2023-03-29: qty 6, 5d supply, fill #0

## 2023-03-29 MED ORDER — SODIUM CHLORIDE 0.9 % IV SOLN
200.0000 mg | INTRAVENOUS | Status: DC
  Filled 2023-03-29: qty 10

## 2023-03-29 MED ORDER — SODIUM CHLORIDE 0.9% FLUSH
10.0000 mL | Freq: Once | INTRAVENOUS | Status: AC | PRN
  Administered 2023-03-29: 10 mL
  Filled 2023-03-29: qty 10

## 2023-03-29 MED ORDER — SODIUM CHLORIDE 0.9 % IV SOLN
Freq: Once | INTRAVENOUS | Status: AC
  Filled 2023-03-29: qty 250

## 2023-03-29 MED ORDER — PREDNISONE 20 MG PO TABS
20.0000 mg | ORAL_TABLET | Freq: Every day | ORAL | 0 refills | Status: DC
  Filled 2023-03-29: qty 5, 5d supply, fill #0

## 2023-03-29 MED ORDER — SODIUM CHLORIDE 0.9 % IV SOLN
200.0000 mg | INTRAVENOUS | Status: DC
  Administered 2023-03-29: 200 mg via INTRAVENOUS
  Filled 2023-03-29: qty 10

## 2023-03-29 NOTE — Patient Instructions (Signed)
Iron Sucrose Injection What is this medication? IRON SUCROSE (EYE ern SOO krose) treats low levels of iron (iron deficiency anemia) in people with kidney disease. Iron is a mineral that plays an important role in making red blood cells, which carry oxygen from your lungs to the rest of your body. This medicine may be used for other purposes; ask your health care provider or pharmacist if you have questions. COMMON BRAND NAME(S): Venofer What should I tell my care team before I take this medication? They need to know if you have any of these conditions: Anemia not caused by low iron levels Heart disease High levels of iron in the blood Kidney disease Liver disease An unusual or allergic reaction to iron, other medications, foods, dyes, or preservatives Pregnant or trying to get pregnant Breastfeeding How should I use this medication? This medication is for infusion into a vein. It is given in a hospital or clinic setting. Talk to your care team about the use of this medication in children. While this medication may be prescribed for children as young as 2 years for selected conditions, precautions do apply. Overdosage: If you think you have taken too much of this medicine contact a poison control center or emergency room at once. NOTE: This medicine is only for you. Do not share this medicine with others. What if I miss a dose? Keep appointments for follow-up doses. It is important not to miss your dose. Call your care team if you are unable to keep an appointment. What may interact with this medication? Do not take this medication with any of the following: Deferoxamine Dimercaprol Other iron products This medication may also interact with the following: Chloramphenicol Deferasirox This list may not describe all possible interactions. Give your health care provider a list of all the medicines, herbs, non-prescription drugs, or dietary supplements you use. Also tell them if you smoke,  drink alcohol, or use illegal drugs. Some items may interact with your medicine. What should I watch for while using this medication? Visit your care team regularly. Tell your care team if your symptoms do not start to get better or if they get worse. You may need blood work done while you are taking this medication. You may need to follow a special diet. Talk to your care team. Foods that contain iron include: whole grains/cereals, dried fruits, beans, or peas, leafy green vegetables, and organ meats (liver, kidney). What side effects may I notice from receiving this medication? Side effects that you should report to your care team as soon as possible: Allergic reactions--skin rash, itching, hives, swelling of the face, lips, tongue, or throat Low blood pressure--dizziness, feeling faint or lightheaded, blurry vision Shortness of breath Side effects that usually do not require medical attention (report to your care team if they continue or are bothersome): Flushing Headache Joint pain Muscle pain Nausea Pain, redness, or irritation at injection site This list may not describe all possible side effects. Call your doctor for medical advice about side effects. You may report side effects to FDA at 1-800-FDA-1088. Where should I keep my medication? This medication is given in a hospital or clinic. It will not be stored at home. NOTE: This sheet is a summary. It may not cover all possible information. If you have questions about this medicine, talk to your doctor, pharmacist, or health care provider.  2024 Elsevier/Gold Standard (2023-01-13 00:00:00)

## 2023-03-29 NOTE — Progress Notes (Signed)
Patient tolerated Venofer infusion well. Explained recommendation of 30 min post monitoring. Patient refused to wait post monitoring. Educated on what signs to watch for & to call with any concerns. No questions, discharged. Stable  

## 2023-03-31 ENCOUNTER — Other Ambulatory Visit: Payer: Self-pay

## 2023-03-31 MED ORDER — AZITHROMYCIN 250 MG PO TABS
ORAL_TABLET | ORAL | 0 refills | Status: DC
  Filled 2023-03-31: qty 3, 3d supply, fill #0

## 2023-04-03 ENCOUNTER — Inpatient Hospital Stay: Payer: Managed Care, Other (non HMO)

## 2023-04-03 VITALS — BP 126/82 | HR 89 | Temp 97.0°F | Resp 18

## 2023-04-03 DIAGNOSIS — D509 Iron deficiency anemia, unspecified: Secondary | ICD-10-CM | POA: Diagnosis not present

## 2023-04-03 MED ORDER — SODIUM CHLORIDE 0.9 % IV SOLN
INTRAVENOUS | Status: DC
  Filled 2023-04-03: qty 250

## 2023-04-03 MED ORDER — SODIUM CHLORIDE 0.9 % IV SOLN
200.0000 mg | INTRAVENOUS | Status: DC
  Administered 2023-04-03: 200 mg via INTRAVENOUS
  Filled 2023-04-03: qty 200

## 2023-04-03 NOTE — Progress Notes (Signed)
Pt has been educated and understands. Pt declined to stay 30 mins after iron infusion. VSS.

## 2023-04-06 ENCOUNTER — Other Ambulatory Visit: Payer: Self-pay

## 2023-04-06 ENCOUNTER — Inpatient Hospital Stay: Payer: Managed Care, Other (non HMO)

## 2023-04-06 MED ORDER — ZEPBOUND 12.5 MG/0.5ML ~~LOC~~ SOAJ
12.5000 mg | SUBCUTANEOUS | 0 refills | Status: DC
  Filled 2023-04-06 – 2023-08-08 (×2): qty 2, 28d supply, fill #0

## 2023-04-07 ENCOUNTER — Encounter: Payer: Self-pay | Admitting: Pharmacist

## 2023-04-07 ENCOUNTER — Other Ambulatory Visit: Payer: Self-pay

## 2023-04-07 MED ORDER — ZEPBOUND 10 MG/0.5ML ~~LOC~~ SOAJ
10.0000 mg | SUBCUTANEOUS | 0 refills | Status: DC
Start: 2023-04-07 — End: 2023-06-21
  Filled 2023-04-07 (×2): qty 2, 28d supply, fill #0

## 2023-04-11 ENCOUNTER — Other Ambulatory Visit: Payer: Self-pay

## 2023-04-13 ENCOUNTER — Inpatient Hospital Stay: Payer: Managed Care, Other (non HMO)

## 2023-04-13 VITALS — BP 123/82 | HR 86 | Temp 95.9°F | Resp 18

## 2023-04-13 DIAGNOSIS — D509 Iron deficiency anemia, unspecified: Secondary | ICD-10-CM | POA: Diagnosis not present

## 2023-04-13 MED ORDER — SODIUM CHLORIDE 0.9 % IV SOLN
INTRAVENOUS | Status: DC
  Filled 2023-04-13: qty 250

## 2023-04-13 MED ORDER — SODIUM CHLORIDE 0.9 % IV SOLN
200.0000 mg | INTRAVENOUS | Status: DC
  Administered 2023-04-13: 200 mg via INTRAVENOUS
  Filled 2023-04-13: qty 200

## 2023-04-20 ENCOUNTER — Inpatient Hospital Stay: Payer: Managed Care, Other (non HMO)

## 2023-04-20 ENCOUNTER — Ambulatory Visit: Payer: Managed Care, Other (non HMO)

## 2023-04-20 VITALS — BP 123/97 | HR 90 | Temp 97.8°F | Resp 18

## 2023-04-20 DIAGNOSIS — D509 Iron deficiency anemia, unspecified: Secondary | ICD-10-CM

## 2023-04-20 MED ORDER — SODIUM CHLORIDE 0.9 % IV SOLN
200.0000 mg | INTRAVENOUS | Status: DC
  Administered 2023-04-20: 200 mg via INTRAVENOUS
  Filled 2023-04-20: qty 200

## 2023-04-20 MED ORDER — SODIUM CHLORIDE 0.9 % IV SOLN
INTRAVENOUS | Status: DC
  Filled 2023-04-20: qty 250

## 2023-04-23 ENCOUNTER — Other Ambulatory Visit: Payer: Self-pay

## 2023-04-25 ENCOUNTER — Other Ambulatory Visit: Payer: Self-pay

## 2023-04-25 MED ORDER — ZEPBOUND 12.5 MG/0.5ML ~~LOC~~ SOAJ
12.5000 mg | SUBCUTANEOUS | 0 refills | Status: DC
  Filled 2023-04-25: qty 2, 28d supply, fill #0

## 2023-04-26 ENCOUNTER — Other Ambulatory Visit: Payer: Self-pay | Admitting: Family Medicine

## 2023-04-26 ENCOUNTER — Encounter: Payer: Self-pay | Admitting: Family Medicine

## 2023-04-26 DIAGNOSIS — Z1231 Encounter for screening mammogram for malignant neoplasm of breast: Secondary | ICD-10-CM

## 2023-04-27 ENCOUNTER — Inpatient Hospital Stay: Payer: Managed Care, Other (non HMO)

## 2023-05-01 ENCOUNTER — Inpatient Hospital Stay: Payer: Managed Care, Other (non HMO) | Attending: Oncology

## 2023-05-05 ENCOUNTER — Other Ambulatory Visit: Payer: Self-pay

## 2023-05-17 ENCOUNTER — Ambulatory Visit
Admission: RE | Admit: 2023-05-17 | Discharge: 2023-05-17 | Disposition: A | Discharge status: Home / Self Care | Payer: Managed Care, Other (non HMO) | Source: Ambulatory Visit | Attending: Family Medicine | Admitting: Family Medicine

## 2023-05-17 DIAGNOSIS — Z1231 Encounter for screening mammogram for malignant neoplasm of breast: Secondary | ICD-10-CM | POA: Insufficient documentation

## 2023-05-22 ENCOUNTER — Other Ambulatory Visit: Payer: Self-pay | Admitting: Family Medicine

## 2023-05-22 DIAGNOSIS — R928 Other abnormal and inconclusive findings on diagnostic imaging of breast: Secondary | ICD-10-CM

## 2023-05-24 ENCOUNTER — Other Ambulatory Visit: Payer: Self-pay

## 2023-05-24 MED ORDER — CEPHALEXIN 500 MG PO CAPS
500.0000 mg | ORAL_CAPSULE | Freq: Three times a day (TID) | ORAL | 0 refills | Status: DC
  Filled 2023-05-24: qty 21, 7d supply, fill #0

## 2023-05-25 ENCOUNTER — Other Ambulatory Visit: Payer: Self-pay | Admitting: Oncology

## 2023-05-29 ENCOUNTER — Inpatient Hospital Stay: Payer: Managed Care, Other (non HMO)

## 2023-05-31 ENCOUNTER — Ambulatory Visit
Admission: RE | Admit: 2023-05-31 | Discharge: 2023-05-31 | Disposition: A | Discharge status: Home / Self Care | Payer: Managed Care, Other (non HMO) | Source: Ambulatory Visit | Attending: Family Medicine | Admitting: Family Medicine

## 2023-05-31 DIAGNOSIS — R928 Other abnormal and inconclusive findings on diagnostic imaging of breast: Secondary | ICD-10-CM

## 2023-06-02 ENCOUNTER — Other Ambulatory Visit: Payer: Self-pay

## 2023-06-02 MED ORDER — ZEPBOUND 12.5 MG/0.5ML ~~LOC~~ SOAJ
12.5000 mg | SUBCUTANEOUS | 0 refills | Status: DC
  Filled 2023-06-02: qty 2, 28d supply, fill #0

## 2023-06-06 ENCOUNTER — Other Ambulatory Visit: Payer: Self-pay | Admitting: *Deleted

## 2023-06-06 DIAGNOSIS — D500 Iron deficiency anemia secondary to blood loss (chronic): Secondary | ICD-10-CM

## 2023-06-07 ENCOUNTER — Inpatient Hospital Stay: Payer: Managed Care, Other (non HMO) | Attending: Oncology

## 2023-06-07 ENCOUNTER — Inpatient Hospital Stay: Payer: Managed Care, Other (non HMO)

## 2023-06-07 VITALS — BP 127/64 | HR 88 | Temp 97.3°F | Resp 18

## 2023-06-07 DIAGNOSIS — D509 Iron deficiency anemia, unspecified: Secondary | ICD-10-CM

## 2023-06-07 DIAGNOSIS — D500 Iron deficiency anemia secondary to blood loss (chronic): Secondary | ICD-10-CM

## 2023-06-07 LAB — CBC WITH DIFFERENTIAL/PLATELET
Abs Immature Granulocytes: 0.03 10*3/uL (ref 0.00–0.07)
Basophils Absolute: 0 10*3/uL (ref 0.0–0.1)
Basophils Relative: 0 %
Eosinophils Absolute: 0.1 10*3/uL (ref 0.0–0.5)
Eosinophils Relative: 1 %
HCT: 34.4 % — ABNORMAL LOW (ref 36.0–46.0)
Hemoglobin: 10.7 g/dL — ABNORMAL LOW (ref 12.0–15.0)
Immature Granulocytes: 1 %
Lymphocytes Relative: 44 %
Lymphs Abs: 2.1 10*3/uL (ref 0.7–4.0)
MCH: 24.9 pg — ABNORMAL LOW (ref 26.0–34.0)
MCHC: 31.1 g/dL (ref 30.0–36.0)
MCV: 80 fL (ref 80.0–100.0)
Monocytes Absolute: 0.4 10*3/uL (ref 0.1–1.0)
Monocytes Relative: 8 %
Neutro Abs: 2.1 10*3/uL (ref 1.7–7.7)
Neutrophils Relative %: 46 %
Platelets: 423 10*3/uL — ABNORMAL HIGH (ref 150–400)
RBC: 4.3 MIL/uL (ref 3.87–5.11)
RDW: 17.2 % — ABNORMAL HIGH (ref 11.5–15.5)
WBC: 4.7 10*3/uL (ref 4.0–10.5)
nRBC: 0 % (ref 0.0–0.2)

## 2023-06-07 LAB — IRON AND TIBC
Iron: 57 ug/dL (ref 28–170)
Saturation Ratios: 16 % (ref 10.4–31.8)
TIBC: 358 ug/dL (ref 250–450)
UIBC: 301 ug/dL

## 2023-06-07 LAB — VITAMIN B12: Vitamin B-12: 467 pg/mL (ref 180–914)

## 2023-06-07 LAB — FERRITIN: Ferritin: 62 ng/mL (ref 11–307)

## 2023-06-07 MED ORDER — SODIUM CHLORIDE 0.9 % IV SOLN
200.0000 mg | Freq: Once | INTRAVENOUS | Status: AC
  Administered 2023-06-07: 200 mg via INTRAVENOUS
  Filled 2023-06-07: qty 200

## 2023-06-07 MED ORDER — SODIUM CHLORIDE 0.9 % IV SOLN
Freq: Once | INTRAVENOUS | Status: AC
  Filled 2023-06-07: qty 250

## 2023-06-07 NOTE — Patient Instructions (Signed)
Iron Sucrose Injection What is this medication? IRON SUCROSE (EYE ern SOO krose) treats low levels of iron (iron deficiency anemia) in people with kidney disease. Iron is a mineral that plays an important role in making red blood cells, which carry oxygen from your lungs to the rest of your body. This medicine may be used for other purposes; ask your health care provider or pharmacist if you have questions. COMMON BRAND NAME(S): Venofer What should I tell my care team before I take this medication? They need to know if you have any of these conditions: Anemia not caused by low iron levels Heart disease High levels of iron in the blood Kidney disease Liver disease An unusual or allergic reaction to iron, other medications, foods, dyes, or preservatives Pregnant or trying to get pregnant Breastfeeding How should I use this medication? This medication is for infusion into a vein. It is given in a hospital or clinic setting. Talk to your care team about the use of this medication in children. While this medication may be prescribed for children as young as 2 years for selected conditions, precautions do apply. Overdosage: If you think you have taken too much of this medicine contact a poison control center or emergency room at once. NOTE: This medicine is only for you. Do not share this medicine with others. What if I miss a dose? Keep appointments for follow-up doses. It is important not to miss your dose. Call your care team if you are unable to keep an appointment. What may interact with this medication? Do not take this medication with any of the following: Deferoxamine Dimercaprol Other iron products This medication may also interact with the following: Chloramphenicol Deferasirox This list may not describe all possible interactions. Give your health care provider a list of all the medicines, herbs, non-prescription drugs, or dietary supplements you use. Also tell them if you smoke,  drink alcohol, or use illegal drugs. Some items may interact with your medicine. What should I watch for while using this medication? Visit your care team regularly. Tell your care team if your symptoms do not start to get better or if they get worse. You may need blood work done while you are taking this medication. You may need to follow a special diet. Talk to your care team. Foods that contain iron include: whole grains/cereals, dried fruits, beans, or peas, leafy green vegetables, and organ meats (liver, kidney). What side effects may I notice from receiving this medication? Side effects that you should report to your care team as soon as possible: Allergic reactions--skin rash, itching, hives, swelling of the face, lips, tongue, or throat Low blood pressure--dizziness, feeling faint or lightheaded, blurry vision Shortness of breath Side effects that usually do not require medical attention (report to your care team if they continue or are bothersome): Flushing Headache Joint pain Muscle pain Nausea Pain, redness, or irritation at injection site This list may not describe all possible side effects. Call your doctor for medical advice about side effects. You may report side effects to FDA at 1-800-FDA-1088. Where should I keep my medication? This medication is given in a hospital or clinic. It will not be stored at home. NOTE: This sheet is a summary. It may not cover all possible information. If you have questions about this medicine, talk to your doctor, pharmacist, or health care provider.  2024 Elsevier/Gold Standard (2023-01-13 00:00:00)

## 2023-06-08 ENCOUNTER — Other Ambulatory Visit: Payer: Self-pay

## 2023-06-08 MED ORDER — VALACYCLOVIR HCL 1 G PO TABS
1000.0000 mg | ORAL_TABLET | Freq: Three times a day (TID) | ORAL | 0 refills | Status: DC
  Filled 2023-06-08: qty 21, 7d supply, fill #0

## 2023-06-08 MED ORDER — PREDNISONE 20 MG PO TABS
20.0000 mg | ORAL_TABLET | Freq: Every day | ORAL | 0 refills | Status: DC
  Filled 2023-06-08: qty 5, 5d supply, fill #0

## 2023-06-09 ENCOUNTER — Other Ambulatory Visit: Payer: Self-pay

## 2023-06-09 MED ORDER — CYCLOBENZAPRINE HCL 5 MG PO TABS
5.0000 mg | ORAL_TABLET | Freq: Every evening | ORAL | 0 refills | Status: DC | PRN
  Filled 2023-06-09: qty 7, 7d supply, fill #0

## 2023-06-12 ENCOUNTER — Other Ambulatory Visit: Payer: Self-pay

## 2023-06-13 ENCOUNTER — Other Ambulatory Visit: Payer: Self-pay

## 2023-06-13 MED ORDER — MELOXICAM 15 MG PO TABS
15.0000 mg | ORAL_TABLET | Freq: Every day | ORAL | 0 refills | Status: DC
  Filled 2023-06-13: qty 14, 14d supply, fill #0

## 2023-06-13 MED ORDER — CYCLOBENZAPRINE HCL 5 MG PO TABS
5.0000 mg | ORAL_TABLET | Freq: Every evening | ORAL | 0 refills | Status: DC | PRN
  Filled 2023-06-13: qty 14, 14d supply, fill #0
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
## 2023-06-21 ENCOUNTER — Ambulatory Visit (INDEPENDENT_AMBULATORY_CARE_PROVIDER_SITE_OTHER): Payer: Managed Care, Other (non HMO) | Admitting: Obstetrics and Gynecology

## 2023-06-21 ENCOUNTER — Encounter: Payer: Self-pay | Admitting: Obstetrics and Gynecology

## 2023-06-21 ENCOUNTER — Other Ambulatory Visit (HOSPITAL_COMMUNITY)
Admission: RE | Admit: 2023-06-21 | Discharge: 2023-06-21 | Disposition: A | Discharge status: Home / Self Care | Payer: Managed Care, Other (non HMO) | Source: Ambulatory Visit | Attending: Obstetrics and Gynecology | Admitting: Obstetrics and Gynecology

## 2023-06-21 VITALS — BP 120/90 | HR 84 | Resp 16 | Ht 65.5 in | Wt 202.6 lb

## 2023-06-21 DIAGNOSIS — Z01419 Encounter for gynecological examination (general) (routine) without abnormal findings: Secondary | ICD-10-CM | POA: Insufficient documentation

## 2023-06-21 DIAGNOSIS — E559 Vitamin D deficiency, unspecified: Secondary | ICD-10-CM

## 2023-06-21 DIAGNOSIS — N920 Excessive and frequent menstruation with regular cycle: Secondary | ICD-10-CM

## 2023-06-21 DIAGNOSIS — Z86018 Personal history of other benign neoplasm: Secondary | ICD-10-CM

## 2023-06-21 DIAGNOSIS — Z124 Encounter for screening for malignant neoplasm of cervix: Secondary | ICD-10-CM | POA: Diagnosis present

## 2023-06-21 DIAGNOSIS — Z131 Encounter for screening for diabetes mellitus: Secondary | ICD-10-CM

## 2023-06-21 DIAGNOSIS — Z13220 Encounter for screening for lipoid disorders: Secondary | ICD-10-CM

## 2023-06-21 DIAGNOSIS — E66811 Obesity, class 1: Secondary | ICD-10-CM

## 2023-06-21 NOTE — Addendum Note (Signed)
Addended by: Tommie Raymond on: 06/21/2023 09:01 AM   Modules accepted: Orders

## 2023-06-22 ENCOUNTER — Other Ambulatory Visit: Payer: Self-pay | Admitting: Obstetrics and Gynecology

## 2023-06-22 DIAGNOSIS — E559 Vitamin D deficiency, unspecified: Secondary | ICD-10-CM

## 2023-06-22 LAB — COMPREHENSIVE METABOLIC PANEL
ALT: 10 [IU]/L (ref 0–32)
AST: 10 [IU]/L (ref 0–40)
Albumin: 4.2 g/dL (ref 3.9–4.9)
Alkaline Phosphatase: 79 [IU]/L (ref 44–121)
BUN/Creatinine Ratio: 12 (ref 9–23)
BUN: 9 mg/dL (ref 6–24)
Bilirubin Total: <0.2 mg/dL (ref 0.0–1.2)
CO2: 23 mmol/L (ref 20–29)
Calcium: 9.6 mg/dL (ref 8.7–10.2)
Chloride: 102 mmol/L (ref 96–106)
Creatinine, Ser: 0.74 mg/dL (ref 0.57–1.00)
Globulin, Total: 3.1 g/dL (ref 1.5–4.5)
Glucose: 87 mg/dL (ref 70–99)
Potassium: 4.4 mmol/L (ref 3.5–5.2)
Sodium: 138 mmol/L (ref 134–144)
Total Protein: 7.3 g/dL (ref 6.0–8.5)
eGFR: 101 mL/min/{1.73_m2} (ref >59)

## 2023-06-22 LAB — HEMOGLOBIN A1C
Est. average glucose Bld gHb Est-mCnc: 114 mg/dL
Hgb A1c MFr Bld: 5.6 % (ref 4.8–5.6)

## 2023-06-22 LAB — TSH: TSH: 3.67 u[IU]/mL (ref 0.450–4.500)

## 2023-06-22 LAB — LIPID PANEL
Chol/HDL Ratio: 4.2 ratio (ref 0.0–4.4)
Cholesterol, Total: 167 mg/dL (ref 100–199)
HDL: 40 mg/dL (ref >39)
LDL Chol Calc (NIH): 113 mg/dL — ABNORMAL HIGH (ref 0–99)
Triglycerides: 75 mg/dL (ref 0–149)
VLDL Cholesterol Cal: 14 mg/dL (ref 5–40)

## 2023-06-22 LAB — VITAMIN D 25 HYDROXY (VIT D DEFICIENCY, FRACTURES): Vit D, 25-Hydroxy: 10.1 ng/mL — ABNORMAL LOW (ref 30.0–100.0)

## 2023-06-22 MED ORDER — VITAMIN D (ERGOCALCIFEROL) 1.25 MG (50000 UNIT) PO CAPS: 50000.0000 [IU] | ORAL_CAPSULE | ORAL | 0 refills | Status: DC

## 2023-06-23 ENCOUNTER — Other Ambulatory Visit: Payer: Self-pay | Admitting: Obstetrics and Gynecology

## 2023-06-23 ENCOUNTER — Other Ambulatory Visit: Payer: Self-pay

## 2023-06-23 DIAGNOSIS — E559 Vitamin D deficiency, unspecified: Secondary | ICD-10-CM

## 2023-06-23 MED ORDER — CYCLOBENZAPRINE HCL 10 MG PO TABS
10.0000 mg | ORAL_TABLET | Freq: Two times a day (BID) | ORAL | 0 refills | Status: DC | PRN
  Filled 2023-06-23: qty 40, 20d supply, fill #0

## 2023-06-23 MED ORDER — VITAMIN D (ERGOCALCIFEROL) 1.25 MG (50000 UNIT) PO CAPS: 50000.0000 [IU] | ORAL_CAPSULE | ORAL | 1 refills | Status: DC

## 2023-06-23 MED ORDER — PREDNISONE 10 MG PO TABS
ORAL_TABLET | ORAL | 0 refills | Status: DC
  Filled 2023-06-23: qty 20, 8d supply, fill #0

## 2023-06-27 LAB — CYTOLOGY - PAP
Comment: NEGATIVE
Diagnosis: NEGATIVE
High risk HPV: NEGATIVE

## 2023-07-10 ENCOUNTER — Other Ambulatory Visit: Payer: Self-pay

## 2023-07-10 MED ORDER — ZEPBOUND 15 MG/0.5ML ~~LOC~~ SOAJ
15.0000 mg | SUBCUTANEOUS | 2 refills | Status: DC
  Filled 2023-07-10: qty 2, 28d supply, fill #0

## 2023-07-11 ENCOUNTER — Other Ambulatory Visit: Payer: Self-pay

## 2023-07-12 ENCOUNTER — Other Ambulatory Visit: Payer: Self-pay

## 2023-07-12 MED ORDER — LINZESS 290 MCG PO CAPS
290.0000 ug | ORAL_CAPSULE | Freq: Every day | ORAL | 3 refills | Status: DC
  Filled 2023-07-12 – 2023-08-08 (×4): qty 90, 90d supply, fill #0

## 2023-07-12 MED ORDER — LINZESS 290 MCG PO CAPS
290.0000 ug | ORAL_CAPSULE | Freq: Every day | ORAL | 5 refills | Status: DC
  Filled 2023-07-12: qty 30, 30d supply, fill #0

## 2023-07-19 ENCOUNTER — Other Ambulatory Visit: Payer: Self-pay

## 2023-07-19 MED ORDER — ZEPBOUND 15 MG/0.5ML ~~LOC~~ SOAJ
15.0000 mg | SUBCUTANEOUS | 2 refills | Status: DC
  Filled 2023-07-19: qty 2, 28d supply, fill #0

## 2023-07-25 ENCOUNTER — Other Ambulatory Visit: Payer: Managed Care, Other (non HMO)

## 2023-07-25 ENCOUNTER — Other Ambulatory Visit: Payer: Self-pay

## 2023-07-25 ENCOUNTER — Telehealth: Payer: Self-pay | Admitting: Obstetrics and Gynecology

## 2023-07-25 DIAGNOSIS — E559 Vitamin D deficiency, unspecified: Secondary | ICD-10-CM

## 2023-07-25 MED ORDER — VITAMIN D (ERGOCALCIFEROL) 1.25 MG (50000 UNIT) PO CAPS
50000.0000 [IU] | ORAL_CAPSULE | ORAL | 1 refills | Status: DC
  Filled 2023-07-25 – 2023-08-08 (×2): qty 12, 84d supply, fill #0

## 2023-07-25 NOTE — Telephone Encounter (Signed)
Reached out to pt about gyn [name] that was scheduled on 07/25/2023 at 4:00.  Gave pt the number for Centralized Scheduling so that appt can be rescheduled.

## 2023-08-07 ENCOUNTER — Other Ambulatory Visit: Payer: Self-pay

## 2023-08-08 ENCOUNTER — Other Ambulatory Visit: Payer: Self-pay

## 2023-08-08 ENCOUNTER — Encounter: Payer: Self-pay | Admitting: Oncology

## 2023-08-08 MED ORDER — VALACYCLOVIR HCL 1 G PO TABS
1000.0000 mg | ORAL_TABLET | Freq: Two times a day (BID) | ORAL | 0 refills | Status: DC
  Filled 2023-08-08: qty 6, 3d supply, fill #0

## 2023-08-17 ENCOUNTER — Other Ambulatory Visit: Payer: Managed Care, Other (non HMO)

## 2023-08-18 ENCOUNTER — Ambulatory Visit: Admission: RE | Admit: 2023-08-18 | Payer: Managed Care, Other (non HMO) | Source: Ambulatory Visit

## 2023-08-21 ENCOUNTER — Encounter: Payer: Self-pay | Admitting: Obstetrics and Gynecology

## 2023-08-22 ENCOUNTER — Other Ambulatory Visit: Payer: Self-pay

## 2023-09-07 ENCOUNTER — Encounter: Payer: Self-pay | Admitting: Oncology

## 2023-09-07 ENCOUNTER — Other Ambulatory Visit: Payer: Self-pay

## 2023-09-07 MED ORDER — ZEPBOUND 15 MG/0.5ML ~~LOC~~ SOAJ
SUBCUTANEOUS | 2 refills | Status: DC
  Filled 2023-09-07: qty 2, 28d supply, fill #0

## 2023-09-08 ENCOUNTER — Other Ambulatory Visit: Payer: Self-pay

## 2023-09-08 DIAGNOSIS — D509 Iron deficiency anemia, unspecified: Secondary | ICD-10-CM

## 2023-09-11 ENCOUNTER — Inpatient Hospital Stay (HOSPITAL_BASED_OUTPATIENT_CLINIC_OR_DEPARTMENT_OTHER): Payer: Managed Care, Other (non HMO) | Admitting: Oncology

## 2023-09-11 ENCOUNTER — Encounter: Payer: Self-pay | Admitting: Oncology

## 2023-09-11 ENCOUNTER — Inpatient Hospital Stay: Payer: Managed Care, Other (non HMO) | Attending: Oncology

## 2023-09-11 VITALS — BP 122/95 | HR 80 | Temp 96.7°F | Resp 18 | Ht 65.0 in | Wt 191.9 lb

## 2023-09-11 DIAGNOSIS — D509 Iron deficiency anemia, unspecified: Secondary | ICD-10-CM

## 2023-09-11 DIAGNOSIS — Z800 Family history of malignant neoplasm of digestive organs: Secondary | ICD-10-CM | POA: Insufficient documentation

## 2023-09-11 LAB — CBC WITH DIFFERENTIAL (CANCER CENTER ONLY)
Abs Immature Granulocytes: 0.02 10*3/uL (ref 0.00–0.07)
Basophils Absolute: 0 10*3/uL (ref 0.0–0.1)
Basophils Relative: 1 %
Eosinophils Absolute: 0 10*3/uL (ref 0.0–0.5)
Eosinophils Relative: 1 %
HCT: 35.2 % — ABNORMAL LOW (ref 36.0–46.0)
Hemoglobin: 11.2 g/dL — ABNORMAL LOW (ref 12.0–15.0)
Immature Granulocytes: 1 %
Lymphocytes Relative: 44 %
Lymphs Abs: 2 10*3/uL (ref 0.7–4.0)
MCH: 25.7 pg — ABNORMAL LOW (ref 26.0–34.0)
MCHC: 31.8 g/dL (ref 30.0–36.0)
MCV: 80.9 fL (ref 80.0–100.0)
Monocytes Absolute: 0.5 10*3/uL (ref 0.1–1.0)
Monocytes Relative: 11 %
Neutro Abs: 1.8 10*3/uL (ref 1.7–7.7)
Neutrophils Relative %: 42 %
Platelet Count: 443 10*3/uL — ABNORMAL HIGH (ref 150–400)
RBC: 4.35 MIL/uL (ref 3.87–5.11)
RDW: 14.9 % (ref 11.5–15.5)
WBC Count: 4.4 10*3/uL (ref 4.0–10.5)
nRBC: 0 % (ref 0.0–0.2)

## 2023-09-11 LAB — IRON AND TIBC
Iron: 51 ug/dL (ref 28–170)
Saturation Ratios: 15 % (ref 10.4–31.8)
TIBC: 350 ug/dL (ref 250–450)
UIBC: 299 ug/dL

## 2023-09-11 LAB — FERRITIN: Ferritin: 42 ng/mL (ref 11–307)

## 2023-09-11 NOTE — Addendum Note (Signed)
Addended by: Suzan Slick on: 09/11/2023 01:44 PM   Modules accepted: Orders

## 2023-09-11 NOTE — Progress Notes (Signed)
Hematology/Oncology Consult note [facility]  Telephone:([phone] [phone]  Patient Care Team: Alba Cory, MD as PCP - General (Family Medicine) Alba Cory, MD as Attending Physician (Family Medicine) Lemar Livings, Merrily Pew, MD (General Surgery) Creig Hines, MD as Consulting Physician (Oncology)   Name of the patient: Sharon Herring  [id number]  May 04, 1976   Date of visit: 09/11/23  Diagnosis-iron deficiency anemia  Chief complaint/ Reason for visit-routine follow-up of iron deficiency anemia  Heme/Onc history: Patient is a 48 year old female referred for iron deficiency anemia.  Her most recent CBC from 05/24/2022 showed an H&H of 9/31.3 with an MCV of 70.  Platelet count was elevated at 550 with a normal white count of 5.2.  Iron studies showed an elevated TIBC of 517.  Ferritin levels were low at 6. Patient has had an EGD by Dr. Lemar Livings back in May 2017 which was normal but showed 2 gastric polyps.  These were negative for dysplasia and malignancy.    Patient had surgery for uterine fibroids last year.  Since then she reports that her menstrual cycles have been much lighter and they continue for about 5 days.  She has had chronic anemia over the last 1 year with a hemoglobin that has been as low as 7 in the past.  Denies any blood loss in her stool or urine.  Denies any dark melanotic stools.  Denies any family history of colon cancer.  Denies any consistent use of NSAIDs Goody powder or BC powder.      Patient had EGD and colonoscopy in January 2024 which was unremarkable.  Menstrual cycles are not heavy.  Interval history-patient last received IV iron in October 2024.  Overall she feels better energy levels have improved.  Menstrual cycles are regular presently.  ECOG PS- 0 Pain scale- 0   Review of systems- Review of Systems  Constitutional:  Negative for chills, fever, malaise/fatigue and weight loss.  HENT:  Negative for  congestion, ear discharge and nosebleeds.   Eyes:  Negative for blurred vision.  Respiratory:  Negative for cough, hemoptysis, sputum production, shortness of breath and wheezing.   Cardiovascular:  Negative for chest pain, palpitations, orthopnea and claudication.  Gastrointestinal:  Negative for abdominal pain, blood in stool, constipation, diarrhea, heartburn, melena, nausea and vomiting.  Genitourinary:  Negative for dysuria, flank pain, frequency, hematuria and urgency.  Musculoskeletal:  Negative for back pain, joint pain and myalgias.  Skin:  Negative for rash.  Neurological:  Negative for dizziness, tingling, focal weakness, seizures, weakness and headaches.  Endo/Heme/Allergies:  Does not bruise/bleed easily.  Psychiatric/Behavioral:  Negative for depression and suicidal ideas. The patient does not have insomnia.       Allergies  Allergen Reactions   Aspirin Nausea And Vomiting   Norethindrone Shortness Of Breath and Rash   Oxycodone-Acetaminophen Rash and Shortness Of Breath   Lexapro [Escitalopram] Palpitations   Tape Other (See Comments)    Burning feeling. Has left mark on skin.   Belviq [Lorcaserin Hcl] Other (See Comments)    headache   Dicyclomine Nausea And Vomiting   Contrave [Naltrexone-Bupropion Hcl Er] Anxiety    Altered mental state     Past Medical History:  Diagnosis Date   Acute pulmonary embolism (HCC)    Anemia    Dizziness    Fibroid uterus    GERD (gastroesophageal reflux disease)    History of blood transfusion 02/26/2021   History of thrombocytosis    History of uterine  fibroid 03/14/2014   myomectomy   IBS (irritable bowel syndrome)    Iron deficiency anemia 03/14/2014   Migraine    Polyp of stomach 01/06/2016   Stomach polyp, pyloric, consistent with prolapse.    Routine screening for STI (sexually transmitted infection) 11/15/2021     Past Surgical History:  Procedure Laterality Date   APPENDECTOMY  2012   CHOLECYSTECTOMY      CHROMOPERTUBATION N/A 02/18/2021   Procedure: CHROMOPERTUBATION;  Surgeon: Hildred Laser, MD;  Location: [facility];  Service: Gynecology;  Laterality: N/A;   COLONOSCOPY WITH PROPOFOL N/A 09/12/2022   Procedure: COLONOSCOPY WITH PROPOFOL;  Surgeon: Regis Bill, MD;  Location: [facility];  Service: Endoscopy;  Laterality: N/A;   ESOPHAGOGASTRODUODENOSCOPY (EGD) WITH PROPOFOL N/A 01/06/2016   Procedure: ESOPHAGOGASTRODUODENOSCOPY (EGD) WITH PROPOFOL;  Surgeon: Earline Mayotte, MD;  Location: [facility];  Service: Endoscopy;  Laterality: N/A;   ESOPHAGOGASTRODUODENOSCOPY (EGD) WITH PROPOFOL N/A 09/12/2022   Procedure: ESOPHAGOGASTRODUODENOSCOPY (EGD) WITH PROPOFOL;  Surgeon: Regis Bill, MD;  Location: [facility];  Service: Endoscopy;  Laterality: N/A;   HERNIA REPAIR     IR FLUORO GUIDED NEEDLE PLC ASPIRATION/INJECTION LOC  11/02/2021   ROBOT ASSISTED MYOMECTOMY N/A 02/18/2021   Procedure: XI ROBOTIC ASSISTED MYOMECTOMY;  Surgeon: Hildred Laser, MD;  Location: [facility];  Service: Gynecology;  Laterality: N/A;   ROBOTIC ASSISTED LAPAROSCOPIC CHOLECYSTECTOMY N/A 03/03/2016   Procedure: ROBOTIC ASSISTED LAPAROSCOPIC CHOLECYSTECTOMY ;  Surgeon: Ricarda Frame, MD;  Location: [facility];  Service: General;  Laterality: N/A;   UTERINE FIBROID SURGERY  2012   XI ROBOTIC ASSISTED VENTRAL HERNIA N/A 07/15/2019   Procedure: XI ROBOTIC ASSISTED VENTRAL HERNIA;  Surgeon: Leafy Ro, MD;  Location: [facility];  Service: General;  Laterality: N/A;    Social History   Socioeconomic History   Marital status: Single    Spouse name: Not on file   Number of children: 0   Years of education: Not on file   Highest education level: Bachelor's degree (e.g., BA, AB, BS)  Occupational History   Occupation: CMA  Tobacco Use   Smoking status: Never   Smokeless tobacco: Never  Vaping Use   Vaping status: Never Used  Substance and Sexual Activity   Alcohol use: No    Alcohol/week: 0.0  standard drinks of alcohol   Drug use: No   Sexual activity: Not Currently    Partners: Male    Birth control/protection: None  Other Topics Concern   Not on file  Social History Narrative   Lives alone, works at [employer]    Social Drivers of Health   Financial Resource Strain: Low Risk  (06/23/2023)   Received from [facility]   Overall Financial Resource Strain (CARDIA)    Difficulty of Paying Living Expenses: Not hard at all  Food Insecurity: No Food Insecurity (06/23/2023)   Received from [facility]   Hunger Vital Sign    Worried About Running Out of Food in the Last Year: Never true    Ran Out of Food in the Last Year: Never true  Transportation Needs: No Transportation Needs (06/23/2023)   Received from [facility] - Transportation    In the past 12 months, has lack of transportation kept you from medical appointments or from getting medications?: No    Lack of Transportation (Non-Medical): No  Physical Activity: Insufficiently Active (04/15/2021)   Exercise Vital Sign    Days of Exercise per Week:  2 days    Minutes of Exercise per Session: 30 min  Stress: No Stress Concern Present (04/15/2021)   [employer] of Occupational Health - Occupational Stress Questionnaire    Feeling of Stress : Only a little  Social Connections: Socially Integrated (04/15/2021)   Social Connection and Isolation Panel [NHANES]    Frequency of Communication with Friends and Family: More than three times a week    Frequency of Social Gatherings with Friends and Family: More than three times a week    Attends Religious Services: More than 4 times per year    Active Member of [employer] or Organizations: Yes    Attends Banker Meetings: 1 to 4 times per year    Marital Status: Married  Catering manager Violence: Not At Risk (04/15/2021)   Humiliation, Afraid, Rape, and Kick questionnaire    Fear of Current or Ex-Partner:  No    Emotionally Abused: No    Physically Abused: No    Sexually Abused: No    Family History  Problem Relation Age of Onset   Hypertension Mother    Hypertension Father    Pancreatic cancer Father 42   Breast cancer Neg Hx    Colon cancer Neg Hx      Current Outpatient Medications:    cyclobenzaprine (FLEXERIL) 10 MG tablet, Take 1 tablet (10 mg total) by mouth 2 (two) times daily as needed., Disp: 40 tablet, Rfl: 0   cyclobenzaprine (FLEXERIL) 5 MG tablet, Take 1 tablet (5 mg total) by mouth at bedtime as needed for muscle spasms for up to 7 days., Disp: 7 tablet, Rfl: 0   iron polysaccharides (NIFEREX) 150 MG capsule, Take by mouth., Disp: , Rfl:    linaclotide (LINZESS) 290 MCG CAPS capsule, Take 1 capsule (290 mcg total) by mouth daily., Disp: 30 capsule, Rfl: 5   linaclotide (LINZESS) 290 MCG CAPS capsule, Take 1 capsule (290 mcg total) by mouth daily., Disp: 30 capsule, Rfl: 5   linaclotide (LINZESS) 290 MCG CAPS capsule, Take 1 capsule (290 mcg total) by mouth once daily, Disp: 90 capsule, Rfl: 3   Multiple Vitamins-Minerals (MULTIVITAMIN ADULT) TABS, Take 1 tablet by mouth daily., Disp: 30 tablet, Rfl: 0   tirzepatide (ZEPBOUND) 12.5 MG/0.5ML Pen, Inject 12.5 mg into the skin every 7 (seven) days., Disp: 2 mL, Rfl: 0   tirzepatide (ZEPBOUND) 12.5 MG/0.5ML Pen, Inject 12.5 mg into the skin every 7 (seven) days., Disp: 2 mL, Rfl: 0   tirzepatide (ZEPBOUND) 12.5 MG/0.5ML Pen, Inject 12.5 mg into the skin once a week., Disp: 2 mL, Rfl: 0   tirzepatide (ZEPBOUND) 15 MG/0.5ML Pen, Inject 15 mg into the skin once a week., Disp: 2 mL, Rfl: 2   tirzepatide (ZEPBOUND) 15 MG/0.5ML Pen, Inject 0.5 mLs (15 mg total) subcutaneously every 7 (seven) days for 90 days (Patient not taking: Reported on 09/11/2023), Disp: 2 mL, Rfl: 2   tirzepatide (ZEPBOUND) 15 MG/0.5ML Pen, Inject 0.5 mLs (15 mg total) subcutaneously every 7 (seven) days for 90 days, Disp: 2 mL, Rfl: 2   valACYclovir (VALTREX)  1000 MG tablet, Take 1 tablet (1,000 mg total) by mouth 2 (two) times daily for 3 days., Disp: 6 tablet, Rfl: 0   Vitamin D, Ergocalciferol, (DRISDOL) 1.25 MG (50000 UNIT) CAPS capsule, Take 1 capsule (50,000 Units total) by mouth every 7 (seven) days., Disp: 12 capsule, Rfl: 1  Current Facility-Administered Medications:    cyanocobalamin ((VITAMIN B-12)) injection 1,000 mcg, 1,000 mcg, Intramuscular, Once, [place], [place],  MD  Physical exam:  Vitals:   09/11/23 1310  BP: (!) 122/95  Pulse: 80  Resp: 18  Temp: (!) 96.7 F (35.9 C)  TempSrc: Tympanic  SpO2: 100%  Weight: 191 lb 14.4 oz (87 kg)  Height: 5\' 5"  (1.651 m)   Physical Exam Cardiovascular:     Rate and Rhythm: Normal rate and regular rhythm.     Heart sounds: Normal heart sounds.  Pulmonary:     Effort: Pulmonary effort is normal.     Breath sounds: Normal breath sounds.  Skin:    General: Skin is warm and dry.  Neurological:     Mental Status: She is alert and oriented to person, place, and time.         Latest Ref Rng & Units 06/21/2023    8:51 AM  CMP  Glucose 70 - 99 mg/dL 87   BUN 6 - 24 mg/dL 9   Creatinine 8.29 - 5.62 mg/dL 1.30   Sodium 865 - 784 mmol/L 138   Potassium 3.5 - 5.2 mmol/L 4.4   Chloride 96 - 106 mmol/L 102   CO2 20 - 29 mmol/L 23   Calcium 8.7 - 10.2 mg/dL 9.6   Total Protein 6.0 - 8.5 g/dL 7.3   Total Bilirubin 0.0 - 1.2 mg/dL <6.9   Alkaline Phos 44 - 121 IU/L 79   AST 0 - 40 IU/L 10   ALT 0 - 32 IU/L 10       Latest Ref Rng & Units 09/11/2023   12:49 PM  CBC  WBC 4.0 - 10.5 K/uL 4.4   Hemoglobin 12.0 - 15.0 g/dL 62.9   Hematocrit 52.8 - 46.0 % 35.2   Platelets 150 - 400 K/uL 443    No images are attached to the encounter.  No results found.   Assessment and plan- Patient is a 48 y.o. female here for routine follow-up of iron deficiency anemia  Patient's hemoglobin is improved to 11.2 from a prior value of 7.5.  This has been among her best values over the last 4  years.  Iron studies are currently pending.  Beta thalassemia testing was negative.  CBC ferritin and iron studies in 3 and 6 months and I will see her back in 6 months.  We will offer her IV iron if iron studies continue to show iron deficiency.  If iron studies are normal given the Presence of persistent microcytosis I will consider checking for alpha gene thalassemia testing in the future   Visit Diagnosis 1. Iron deficiency anemia, unspecified iron deficiency anemia type      Dr. Owens Shark, MD, MPH [facility] [phone] 09/11/2023 1:29 PM

## 2023-09-18 ENCOUNTER — Inpatient Hospital Stay: Payer: Managed Care, Other (non HMO)

## 2023-09-25 ENCOUNTER — Inpatient Hospital Stay: Payer: Managed Care, Other (non HMO) | Attending: Oncology

## 2023-10-02 ENCOUNTER — Inpatient Hospital Stay: Payer: Managed Care, Other (non HMO)

## 2023-10-11 ENCOUNTER — Other Ambulatory Visit: Payer: Managed Care, Other (non HMO)

## 2023-10-17 ENCOUNTER — Ambulatory Visit: Payer: Managed Care, Other (non HMO)

## 2023-10-24 ENCOUNTER — Ambulatory Visit
Admission: RE | Admit: 2023-10-24 | Discharge: 2023-10-24 | Disposition: A | Discharge status: Home / Self Care | Payer: Managed Care, Other (non HMO) | Source: Ambulatory Visit | Attending: Obstetrics and Gynecology | Admitting: Obstetrics and Gynecology

## 2023-10-24 DIAGNOSIS — N920 Excessive and frequent menstruation with regular cycle: Secondary | ICD-10-CM | POA: Diagnosis present

## 2023-10-24 DIAGNOSIS — Z86018 Personal history of other benign neoplasm: Secondary | ICD-10-CM | POA: Insufficient documentation

## 2023-10-25 ENCOUNTER — Other Ambulatory Visit: Payer: Self-pay

## 2023-11-09 ENCOUNTER — Encounter: Payer: Self-pay | Admitting: Obstetrics and Gynecology

## 2023-11-20 NOTE — Progress Notes (Deleted)
    GYNECOLOGY PROGRESS NOTE  Subjective:    Patient ID: Sharon Herring, female    DOB: 1976-01-24, 48 y.o.   MRN: [id number]  HPI  Patient is a 48 y.o. G69P0010 female who presents for follow up on Ultrasound Results. She had ultrasound done on 03/04/025 for heavy bleeding and status post prior myomectomy. Patient was informed that nothing needs to be done at this time after results were reviewed.   {Common ambulatory SmartLinks:19316}  Review of Systems {ros; complete:30496}   Objective:   There were no vitals taken for this visit. There is no height or weight on file to calculate BMI. General appearance: {general exam:16600} Abdomen: {abdominal exam:16834} Pelvic: {pelvic exam:16852::"cervix normal in appearance","external genitalia normal","no adnexal masses or tenderness","no cervical motion tenderness","rectovaginal septum normal","uterus normal size, shape, and consistency","vagina normal without discharge"} Extremities: {extremity exam:5109} Neurologic: {neuro exam:17854}  Ultrasound Report:  CLINICAL DATA:  Heavy menses.  Status post prior myomectomy   EXAM: TRANSABDOMINAL AND TRANSVAGINAL ULTRASOUND OF PELVIS   TECHNIQUE: Both transabdominal and transvaginal ultrasound examinations of the pelvis were performed. Transabdominal technique was performed for global imaging of the pelvis including uterus, ovaries, adnexal regions, and pelvic cul-de-sac. It was necessary to proceed with endovaginal exam following the transabdominal exam to visualize the .   COMPARISON:  Pelvic ultrasound November 02, 2020 CT abdomen and pelvis Jan 11, 2020   FINDINGS: Uterus   Measurements: 12.0 x 8.6 x 8.2 cm = volume: 443 mL. Anterior intramural uterine fibroid measuring 4.3 x 3.4 x 4.8 cm   Endometrium   Thickness: 5.4 mm.  No focal abnormality visualized.   Right ovary   Measurements: 1.7 x 1.2 x 1.9 cm = volume: 2.4 mL. Normal appearance/no adnexal mass.   Left ovary    Measurements: 2.2 x 1.8 x 2.1 cm = volume: 4.4 mL. Simple left adnexal ovarian cysts measuring 9 x 7 mm   Other findings   No abnormal free fluid.   IMPRESSION: *Anterior intramural uterine fibroid measuring 4.3 x 3.4 x 4.8 cm. *Simple left adnexal ovarian cyst. *Left ovarian follicle measuring 2.2 cm, normal finding for premenopausal patient (benign simple cyst for postmenopausal patient).    Assessment:   No diagnosis found.   Plan:   There are no diagnoses linked to this encounter.     Hildred Laser, MD [facility]

## 2023-11-21 ENCOUNTER — Ambulatory Visit: Admitting: Obstetrics and Gynecology

## 2023-11-21 ENCOUNTER — Other Ambulatory Visit: Payer: Self-pay

## 2023-11-21 ENCOUNTER — Encounter: Payer: Self-pay | Admitting: Obstetrics and Gynecology

## 2023-11-21 VITALS — BP 115/80 | HR 122 | Resp 16 | Ht 65.0 in | Wt 214.1 lb

## 2023-11-21 DIAGNOSIS — Z09 Encounter for follow-up examination after completed treatment for conditions other than malignant neoplasm: Secondary | ICD-10-CM

## 2023-11-21 DIAGNOSIS — E559 Vitamin D deficiency, unspecified: Secondary | ICD-10-CM

## 2023-11-21 DIAGNOSIS — D251 Intramural leiomyoma of uterus: Secondary | ICD-10-CM

## 2023-11-21 DIAGNOSIS — N926 Irregular menstruation, unspecified: Secondary | ICD-10-CM

## 2023-11-21 DIAGNOSIS — Z9889 Other specified postprocedural states: Secondary | ICD-10-CM

## 2023-11-21 DIAGNOSIS — N920 Excessive and frequent menstruation with regular cycle: Secondary | ICD-10-CM

## 2023-11-21 DIAGNOSIS — Z86018 Personal history of other benign neoplasm: Secondary | ICD-10-CM

## 2023-11-21 MED ORDER — VITAMIN D (ERGOCALCIFEROL) 1.25 MG (50000 UNIT) PO CAPS
50000.0000 [IU] | ORAL_CAPSULE | ORAL | 1 refills | Status: AC
  Filled 2023-11-21 (×2): qty 12, 84d supply, fill #0

## 2023-11-21 NOTE — Progress Notes (Signed)
    GYNECOLOGY PROGRESS NOTE  Subjective:    Patient ID: Sharon Herring, female    DOB: [birth year], 48 y.o.   MRN: [id number]  HPI  Patient is a 48 y.o. G69P0010 female who presents for follow up after ultrasound Results. She had ultrasound done on 03/04/025 for heavy prolonged bleeding with last menstrual cycle and prior history of myomectomy. Last cycle lasted 9 days. Is concerned that her fibroids have returned. Has been looking into possible management options and is thinking about a fibroid embolization.   Patient also requests that Vitamin D prescription that was prescribed last visit be sent to a different pharmacy.   The following portions of the patient's history were reviewed and updated as appropriate: allergies, current medications, past family history, past medical history, past social history, past surgical history, and problem list.  Review of Systems Pertinent items noted in HPI and remainder of comprehensive ROS otherwise negative.   Objective:   Blood pressure 115/80, pulse (!) 122, resp. rate 16, height 5\' 5"  (1.651 m), weight 214 lb 1.6 oz (97.1 kg). Body mass index is 35.63 kg/m. General appearance: alert and no distress   Ultrasound Report:  CLINICAL DATA:  Heavy menses.  Status post prior myomectomy   EXAM: TRANSABDOMINAL AND TRANSVAGINAL ULTRASOUND OF PELVIS   TECHNIQUE: Both transabdominal and transvaginal ultrasound examinations of the pelvis were performed. Transabdominal technique was performed for global imaging of the pelvis including uterus, ovaries, adnexal regions, and pelvic cul-de-sac. It was necessary to proceed with endovaginal exam following the transabdominal exam to visualize the .   COMPARISON:  Pelvic ultrasound November 02, 2020 CT abdomen and pelvis Jan 11, 2020   FINDINGS: Uterus   Measurements: 12.0 x 8.6 x 8.2 cm = volume: 443 mL. Anterior intramural uterine fibroid measuring 4.3 x 3.4 x 4.8 cm   Endometrium   Thickness:  5.4 mm.  No focal abnormality visualized.   Right ovary   Measurements: 1.7 x 1.2 x 1.9 cm = volume: 2.4 mL. Normal appearance/no adnexal mass.   Left ovary   Measurements: 2.2 x 1.8 x 2.1 cm = volume: 4.4 mL. Simple left adnexal ovarian cysts measuring 9 x 7 mm   Other findings   No abnormal free fluid.   IMPRESSION: *Anterior intramural uterine fibroid measuring 4.3 x 3.4 x 4.8 cm. *Simple left adnexal ovarian cyst. *Left ovarian follicle measuring 2.2 cm, normal finding for premenopausal patient (benign simple cyst for postmenopausal patient).    Assessment:   1. Intramural leiomyoma of uterus   2. History of myomectomy   3. Abnormal menstrual cycle   4. Vitamin D deficiency      Plan:   - Reviewed ultrasound results with patient. Based on size and location of fibroid, advised that she would be a candidate for UFE if desired.  Also discussed option of hysterectomy. Patient not a candidate for medical management options outside of progesterone containing products due to history of pulmonary embolism.  Has had allergic reaction to Aygestin in the past. Given sample of Slynd in case current cycle is also heavy. Referral to Interventional Radiology.  - New Vitamin D prescription sent to desired pharmacy for deficiency.    A total of 15 minutes were spent face-to-face with the patient during this encounter and over half of that time dealt with counseling and coordination of care.  Hildred Laser, MD [facility]

## 2023-11-22 ENCOUNTER — Other Ambulatory Visit: Payer: Self-pay

## 2023-12-11 ENCOUNTER — Inpatient Hospital Stay: Payer: Managed Care, Other (non HMO) | Attending: Oncology

## 2024-01-02 ENCOUNTER — Other Ambulatory Visit: Payer: Self-pay

## 2024-01-02 MED ORDER — AMOXICILLIN 875 MG PO TABS
875.0000 mg | ORAL_TABLET | Freq: Two times a day (BID) | ORAL | 0 refills | Status: AC
  Filled 2024-01-02: qty 14, 7d supply, fill #0

## 2024-03-08 ENCOUNTER — Other Ambulatory Visit: Payer: Self-pay

## 2024-03-08 MED ORDER — LINZESS 290 MCG PO CAPS
290.0000 ug | ORAL_CAPSULE | Freq: Every day | ORAL | 5 refills | Status: AC
  Filled 2024-03-08: qty 90, 90d supply, fill #0

## 2024-03-11 ENCOUNTER — Other Ambulatory Visit: Payer: Managed Care, Other (non HMO)

## 2024-03-11 ENCOUNTER — Ambulatory Visit: Payer: Managed Care, Other (non HMO) | Admitting: Nurse Practitioner

## 2024-03-12 ENCOUNTER — Other Ambulatory Visit: Payer: Self-pay

## 2024-03-12 MED ORDER — FERROUS SULFATE 220 (44 FE) MG/5ML PO SOLN
220.0000 mg | ORAL | 2 refills | Status: AC
  Filled 2024-03-12 (×2): qty 150, 70d supply, fill #0

## 2024-03-13 ENCOUNTER — Other Ambulatory Visit: Payer: Self-pay

## 2024-03-13 MED ORDER — ERGOCALCIFEROL 1.25 MG (50000 UT) PO CAPS
1.0000 | ORAL_CAPSULE | ORAL | 2 refills | Status: AC
  Filled 2024-03-13: qty 4, 28d supply, fill #0

## 2024-03-21 ENCOUNTER — Ambulatory Visit: Admitting: Nurse Practitioner

## 2024-03-21 ENCOUNTER — Other Ambulatory Visit

## 2024-03-26 ENCOUNTER — Other Ambulatory Visit: Payer: Self-pay

## 2024-03-26 MED ORDER — PREDNISONE 20 MG PO TABS
ORAL_TABLET | ORAL | 0 refills | Status: AC
  Filled 2024-03-26: qty 5, 5d supply, fill #0

## 2024-03-26 MED ORDER — AMOXICILLIN-POT CLAVULANATE 875-125 MG PO TABS
ORAL_TABLET | ORAL | 0 refills | Status: AC
  Filled 2024-03-26: qty 14, 7d supply, fill #0

## 2024-03-26 MED ORDER — FUROSEMIDE 20 MG PO TABS
20.0000 mg | ORAL_TABLET | Freq: Every day | ORAL | 3 refills | Status: AC | PRN
  Filled 2024-03-26: qty 30, 30d supply, fill #0

## 2024-04-05 ENCOUNTER — Inpatient Hospital Stay

## 2024-04-05 ENCOUNTER — Telehealth: Payer: Self-pay | Admitting: Oncology

## 2024-04-05 ENCOUNTER — Inpatient Hospital Stay: Admitting: Nurse Practitioner

## 2024-04-05 NOTE — Telephone Encounter (Signed)
 Pt was scheduled for appts today 04/05/24 with Tinnie Dawn. I called pt to see if she wanted to r/s appts. Pt stated she call last week to r/s as she only wants to see the MD and needs appts around 3pm-4pm.  Appts are r/s to next available MD appt with the latest time.  Pt scheduled for lab as well as MD. Pt stated she had labs done at [facility] about a week ago and wants to know if she needs to keep the lab appt when she sees MD or if it can just be MD.  Please advise, thank you

## 2024-04-05 NOTE — Telephone Encounter (Signed)
 Jus md visit is fine. Cancel labs

## 2024-04-30 ENCOUNTER — Encounter: Admitting: Advanced Practice Midwife

## 2024-05-06 ENCOUNTER — Encounter: Payer: Self-pay | Admitting: Oncology

## 2024-05-06 ENCOUNTER — Inpatient Hospital Stay: Attending: Oncology | Admitting: Oncology

## 2024-05-06 ENCOUNTER — Other Ambulatory Visit

## 2024-07-16 ENCOUNTER — Other Ambulatory Visit: Payer: Self-pay

## 2024-07-16 MED ORDER — NYSTATIN 100000 UNIT/ML MT SUSP
5.0000 mL | Freq: Four times a day (QID) | OROMUCOSAL | 0 refills | Status: AC
  Filled 2024-07-16: qty 60, 3d supply, fill #0

## 2024-07-29 ENCOUNTER — Telehealth: Payer: Self-pay | Admitting: Oncology

## 2024-07-29 NOTE — Telephone Encounter (Signed)
 Pt called to r/s appt - confirmed new date/time for lab/MD follow up - [facility]

## 2024-08-12 ENCOUNTER — Telehealth: Payer: Self-pay | Admitting: *Deleted

## 2024-08-12 NOTE — Telephone Encounter (Signed)
 Patient called triage and is requesting to change her apts this Wednesday with Dr. Melanee. She is unable to get off of work this day. Megan,Please return her call at [phone]

## 2024-08-14 ENCOUNTER — Inpatient Hospital Stay

## 2024-08-14 ENCOUNTER — Inpatient Hospital Stay: Admitting: Oncology

## 2024-09-04 ENCOUNTER — Inpatient Hospital Stay: Attending: Oncology

## 2024-09-04 ENCOUNTER — Inpatient Hospital Stay (HOSPITAL_BASED_OUTPATIENT_CLINIC_OR_DEPARTMENT_OTHER): Admitting: Oncology

## 2024-09-04 ENCOUNTER — Ambulatory Visit: Payer: Self-pay | Admitting: Oncology

## 2024-09-04 DIAGNOSIS — D509 Iron deficiency anemia, unspecified: Secondary | ICD-10-CM

## 2024-09-04 LAB — CBC WITH DIFFERENTIAL (CANCER CENTER ONLY)
Abs Immature Granulocytes: 0.04 K/uL (ref 0.00–0.07)
Basophils Absolute: 0 K/uL (ref 0.0–0.1)
Basophils Relative: 0 %
Eosinophils Absolute: 0.1 K/uL (ref 0.0–0.5)
Eosinophils Relative: 2 %
HCT: 28.3 % — ABNORMAL LOW (ref 36.0–46.0)
Hemoglobin: 8.3 g/dL — ABNORMAL LOW (ref 12.0–15.0)
Immature Granulocytes: 1 %
Lymphocytes Relative: 42 %
Lymphs Abs: 2.2 K/uL (ref 0.7–4.0)
MCH: 19.8 pg — ABNORMAL LOW (ref 26.0–34.0)
MCHC: 29.3 g/dL — ABNORMAL LOW (ref 30.0–36.0)
MCV: 67.5 fL — ABNORMAL LOW (ref 80.0–100.0)
Monocytes Absolute: 0.6 K/uL (ref 0.1–1.0)
Monocytes Relative: 11 %
Neutro Abs: 2.4 K/uL (ref 1.7–7.7)
Neutrophils Relative %: 44 %
Platelet Count: 535 K/uL — ABNORMAL HIGH (ref 150–400)
RBC: 4.19 MIL/uL (ref 3.87–5.11)
RDW: 18.5 % — ABNORMAL HIGH (ref 11.5–15.5)
WBC Count: 5.3 K/uL (ref 4.0–10.5)
nRBC: 0 % (ref 0.0–0.2)

## 2024-09-04 LAB — IRON AND TIBC
Iron: 23 ug/dL — ABNORMAL LOW (ref 28–170)
Saturation Ratios: 5 % — ABNORMAL LOW (ref 10.4–31.8)
TIBC: 505 ug/dL — ABNORMAL HIGH (ref 250–450)
UIBC: 483 ug/dL

## 2024-09-04 LAB — FERRITIN: Ferritin: 9 ng/mL — ABNORMAL LOW (ref 11–307)

## 2024-09-04 NOTE — Progress Notes (Signed)
 "    Hematology/Oncology Consult note [facility]  [phone] [phone]  Patient Care Team: Sowles, Krichna, MD as PCP - General (Family Medicine) Sowles, Krichna, MD as Attending Physician (Family Medicine) Dessa, Reyes ORN, MD (General Surgery) Melanee Annah BROCKS, MD as Consulting Physician (Oncology)   Name of the patient: Sharon Herring  [id number]  11-Feb-1976   Date of visit: 09/04/2024  Diagnosis-iron  deficiency anemia  Chief complaint/ Reason for visit-routine follow-up of iron  deficiency anemia  Heme/Onc history: Patient is a 49 year old female referred for iron  deficiency anemia.  Her most recent CBC from 05/24/2022 showed an H&H of 9/31.3 with an MCV of 70.  Platelet count was elevated at 550 with a normal white count of 5.2.  Iron  studies showed an elevated TIBC of 517.  Ferritin levels were low at 6. Patient has had an EGD by Dr. Dessa back in May 2017 which was normal but showed 2 gastric polyps.  These were negative for dysplasia and malignancy.    Patient had surgery for uterine fibroids last year.  Since then she reports that her menstrual cycles have been much lighter and they continue for about 5 days.  She has had chronic anemia over the last 1 year with a hemoglobin that has been as low as 7 in the past.  Denies any blood loss in her stool or urine.  Denies any dark melanotic stools.  Denies any family history of colon cancer.  Denies any consistent use of NSAIDs Goody powder or BC powder.      Patient had EGD and colonoscopy in January 2024 which was unremarkable.  Menstrual cycles  can be heavy at times.  She has seen Dr. Unk for spine  Interval history-Patient reports ongoing fatigue.  She has been following up with Dr. Unk for irritable bowel syndrome with constipation.  Menstrual cycles are regular but at times can be heavy.  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 Negative for chills, fever and weight loss.  HENT:  Negative for congestion, ear discharge and nosebleeds.   Eyes:  Negative for blurred vision.  Respiratory:  Negative for cough, hemoptysis, sputum production, shortness of breath and wheezing.   Cardiovascular:  Negative for chest pain, palpitations, orthopnea and claudication.  Gastrointestinal:  Negative for abdominal pain, blood in stool, constipation, diarrhea, heartburn, melena, nausea and vomiting.  Genitourinary:  Negative for dysuria, flank pain, frequency, hematuria and urgency.  Musculoskeletal:  Negative for back pain, joint pain and myalgias.  Skin:  Negative for rash.  Neurological:  Negative for dizziness, tingling, focal weakness, seizures, weakness and headaches.  Endo/Heme/Allergies:  Does not bruise/bleed easily.  Psychiatric/Behavioral:  Negative for depression and suicidal ideas. The patient does not have insomnia.       Allergies[1]   Past Medical History:  Diagnosis Date   Acute pulmonary embolism (HCC)    Anemia    Dizziness    Fibroid uterus    GERD (gastroesophageal reflux disease)    History of blood transfusion 02/26/2021   History of thrombocytosis    History of uterine fibroid 03/14/2014   myomectomy   IBS (irritable bowel syndrome)    Iron  deficiency anemia 03/14/2014   Migraine    Polyp of stomach 01/06/2016   Stomach  polyp, pyloric, consistent with prolapse.    Routine screening for STI (sexually transmitted infection) 11/15/2021     Past Surgical History:  Procedure Laterality Date   APPENDECTOMY  2012   CHOLECYSTECTOMY     CHROMOPERTUBATION N/A 02/18/2021   Procedure: CHROMOPERTUBATION;  Surgeon: Connell Davies, MD;  Location: [facility];  Service: Gynecology;  Laterality: N/A;   COLONOSCOPY WITH PROPOFOL  N/A  09/12/2022   Procedure: COLONOSCOPY WITH PROPOFOL ;  Surgeon: Maryruth Ole DASEN, MD;  Location: [facility];  Service: Endoscopy;  Laterality: N/A;   ESOPHAGOGASTRODUODENOSCOPY (EGD) WITH PROPOFOL  N/A 01/06/2016   Procedure: ESOPHAGOGASTRODUODENOSCOPY (EGD) WITH PROPOFOL ;  Surgeon: Reyes LELON Cota, MD;  Location: [facility];  Service: Endoscopy;  Laterality: N/A;   ESOPHAGOGASTRODUODENOSCOPY (EGD) WITH PROPOFOL  N/A 09/12/2022   Procedure: ESOPHAGOGASTRODUODENOSCOPY (EGD) WITH PROPOFOL ;  Surgeon: Maryruth Ole DASEN, MD;  Location: [facility];  Service: Endoscopy;  Laterality: N/A;   HERNIA REPAIR     IR FLUORO GUIDED NEEDLE PLC ASPIRATION/INJECTION LOC  11/02/2021   ROBOT ASSISTED MYOMECTOMY N/A 02/18/2021   Procedure: XI ROBOTIC ASSISTED MYOMECTOMY;  Surgeon: Connell Davies, MD;  Location: [facility];  Service: Gynecology;  Laterality: N/A;   ROBOTIC ASSISTED LAPAROSCOPIC CHOLECYSTECTOMY N/A 03/03/2016   Procedure: ROBOTIC ASSISTED LAPAROSCOPIC CHOLECYSTECTOMY ;  Surgeon: Carlin Pastel, MD;  Location: [facility];  Service: General;  Laterality: N/A;   UTERINE FIBROID SURGERY  2012   XI ROBOTIC ASSISTED VENTRAL HERNIA N/A 07/15/2019   Procedure: XI ROBOTIC ASSISTED VENTRAL HERNIA;  Surgeon: Jordis Laneta FALCON, MD;  Location: [facility];  Service: General;  Laterality: N/A;    Social History   Socioeconomic History   Marital status: Married    Spouse name: Not on file   Number of children: 0   Years of education: Not on file   Highest education level: Bachelor's degree (e.g., BA, AB, BS)  Occupational History   Occupation: CMA  Tobacco Use   Smoking status: Never   Smokeless tobacco: Never  Vaping Use   Vaping status: Never Used  Substance and Sexual Activity   Alcohol use: No    Alcohol/week: 0.0 standard drinks of alcohol   Drug use: No   Sexual activity: Not Currently    Partners: Male    Birth control/protection: None  Other Topics Concern   Not on file  Social History  Narrative   Lives alone, works at [employer]    Social Drivers of Health   Tobacco Use: Low Risk  (05/17/2024)   Received from [facility]   Patient History    Smoking Tobacco Use: Never    Smokeless Tobacco Use: Never    Passive Exposure: Not on file  Financial Resource Strain: Low Risk  (06/23/2023)   [facility]   Overall Financial Resource Strain (CARDIA)    Difficulty of Paying Living Expenses: Not hard at all  Food Insecurity: No Food Insecurity (06/23/2023)   Received from [facility]   Epic    Within the past 12 months, the food you bought just didn't last and you didn't have money to get more.: Never true    Within the past 12 months, you worried that your food would run out before you got the money to buy more.: Never true  Transportation Needs: No Transportation Needs (06/23/2023)   Received from [facility]   PRAPARE - Transportation    Lack of Transportation (Non-Medical): No    In the past 12 months, has lack  of transportation kept you from medical appointments or from getting medications?: No  Physical Activity: Not on file  Stress: Not on file  Social Connections: Not on file  Intimate Partner Violence: Not on file  Depression (PHQ2-9): Low Risk (09/04/2024)   Depression (PHQ2-9)    PHQ-2 Score: 0  Alcohol Screen: Not on file  Housing: Unknown (04/18/2024)   Received [facility]   Epic    In the last 12 months, was there a time when you were not able to pay the mortgage or rent on time?: No    Number of Times Moved in the Last Year: Not on file    At any time in the past 12 months, were you homeless or living in a shelter (including now)?: No  Utilities: Not At Risk (06/23/2023)   Received from [facility] Utilities    Threatened with loss of utilities: No  Health Literacy: Not on file    Family History  Problem Relation Age of  Onset   Hypertension Mother    Hypertension Father    Pancreatic cancer Father 72   Breast cancer Neg Hx    Colon cancer Neg Hx     Current Medications[2]  Physical exam: There were no vitals filed for this visit. Physical Exam Cardiovascular:     Rate and Rhythm: Normal rate and regular rhythm.     Heart sounds: Normal heart sounds.  Pulmonary:     Effort: Pulmonary effort is normal.     Breath sounds: Normal breath sounds.  Skin:    General: Skin is warm and dry.  Neurological:     Mental Status: She is alert and oriented to person, place, and time.      I have personally reviewed labs listed below:    Latest Ref Rng & Units 06/21/2023    8:51 AM  CMP  Glucose 70 - 99 mg/dL 87   BUN 6 - 24 mg/dL 9   Creatinine 9.42 - 8.99 mg/dL 9.25   Sodium 865 - 855 mmol/L 138   Potassium 3.5 - 5.2 mmol/L 4.4   Chloride 96 - 106 mmol/L 102   CO2 20 - 29 mmol/L 23   Calcium 8.7 - 10.2 mg/dL 9.6   Total Protein 6.0 - 8.5 g/dL 7.3   Total Bilirubin 0.0 - 1.2 mg/dL <9.7   Alkaline Phos 44 - 121 IU/L 79   AST 0 - 40 IU/L 10   ALT 0 - 32 IU/L 10       Latest Ref Rng & Units 09/04/2024    3:12 PM  CBC  WBC 4.0 - 10.5 K/uL 5.3   Hemoglobin 12.0 - 15.0 g/dL 8.3   Hematocrit 63.9 - 46.0 % 28.3   Platelets 150 - 400 K/uL 535      Assessment and plan- Patient is a 49 y.o. female here for routine follow-up of iron  deficiency anemia possibly secondary to atrophic gastritis  Patient is anemic based on today's labs which show an H&H of 8.3/28.3.  Ferritin levels are low at 9.  We will proceed with 5 doses of Venofer  at this time.  She has received that in the past and has tolerated it well without any significant side effects.  Again discussed risks and benefits of IV iron  including all but not limited to possible risk of infusion anaphylactic reaction.  Patient understands and agrees to proceed as planned.  CBC ferritin and iron  studies in 2 4  and 6 months and I will see her back in 6  months   Visit Diagnosis 1. Iron  deficiency anemia, unspecified iron  deficiency anemia type      Dr. Annah Skene, MD, MPH [facility] [phone] 09/04/2024 4:26 PM                   [1]  Allergies Allergen Reactions   Aspirin Nausea And Vomiting   Norethindrone  Shortness Of Breath and Rash   Oxycodone-Acetaminophen  Rash and Shortness Of Breath   Lexapro  [Escitalopram ] Palpitations   Tape Other (See Comments)    Burning feeling. Has left mark on skin.   Belviq [Lorcaserin  Hcl] Other (See Comments)    headache   Dicyclomine  Nausea And Vomiting   Contrave  [Naltrexone -Bupropion  Hcl Er] Anxiety    Altered mental state  [2]  Current Outpatient Medications:    ergocalciferol  (VITAMIN D2) 1.25 MG (50000 UT) capsule, Take 1 capsule (50,000 Units total) by mouth once a week., Disp: 4 capsule, Rfl: 2   ferrous sulfate  220 (44 Fe) MG/5ML solution, Take 5 mLs (220 mg total) by mouth once daily for 90 days TAKE BY MOUTH ON MONDAY, WEDNESDAY, AND FRIDAY ON AN EMPTY STOMACH ONE HOUR BEFORE A MEAL OR TWO HOURS AFTER A MEAL, Disp: 150 mL, Rfl: 2   furosemide  (LASIX ) 20 MG tablet, Take 1 tablet (20 mg total) by mouth daily as needed for swelling, Disp: 30 tablet, Rfl: 3   linaclotide  (LINZESS ) 290 MCG CAPS capsule, Take 1 capsule (290 mcg total) by mouth daily., Disp: 30 capsule, Rfl: 5   Multiple Vitamins-Minerals (MULTIVITAMIN ADULT) TABS, Take 1 tablet by mouth daily., Disp: 30 tablet, Rfl: 0   predniSONE  (DELTASONE ) 20 MG tablet, Take 1 tablet (20 mg total) by mouth once daily for 5 days, Disp: 5 tablet, Rfl: 0   Vitamin D , Ergocalciferol , (DRISDOL ) 1.25 MG (50000 UNIT) CAPS capsule, Take 1 capsule (50,000 Units total) by mouth every 7 (seven) days., Disp: 12 capsule, Rfl: 1   amoxicillin  (AMOXIL ) 875 MG tablet, Take 1 tablet (875 mg total) by mouth 2 (two) times daily for 7 days. (Patient not taking: Reported on 09/04/2024), Disp: 14 tablet, Rfl: 0    amoxicillin -clavulanate (AUGMENTIN ) 875-125 MG tablet, Take 1 tablet (875 mg total) by mouth every 12 (twelve) hours for 7 days (Patient not taking: Reported on 09/04/2024), Disp: 14 tablet, Rfl: 0   iron  polysaccharides (NIFEREX) 150 MG capsule, Take by mouth. (Patient not taking: Reported on 09/04/2024), Disp: , Rfl:    nystatin  (MYCOSTATIN ) 100000 UNIT/ML suspension, Swish and swallow 5 mLs (500,000 Units total) 4 (four) times daily. (Patient not taking: Reported on 09/04/2024), Disp: 60 mL, Rfl: 0  Current Facility-Administered Medications:    cyanocobalamin  ((VITAMIN B-12)) injection 1,000 mcg, 1,000 mcg, Intramuscular, Once, Sowles, Krichna, MD  "

## 2024-09-05 ENCOUNTER — Encounter: Payer: Self-pay | Admitting: Oncology

## 2024-09-10 ENCOUNTER — Inpatient Hospital Stay

## 2024-09-12 ENCOUNTER — Inpatient Hospital Stay

## 2024-09-16 ENCOUNTER — Inpatient Hospital Stay

## 2024-09-18 ENCOUNTER — Inpatient Hospital Stay

## 2024-09-19 ENCOUNTER — Inpatient Hospital Stay

## 2024-09-20 ENCOUNTER — Inpatient Hospital Stay

## 2024-09-23 ENCOUNTER — Inpatient Hospital Stay

## 2024-09-27 ENCOUNTER — Inpatient Hospital Stay

## 2024-10-04 ENCOUNTER — Inpatient Hospital Stay

## 2024-11-04 ENCOUNTER — Inpatient Hospital Stay

## 2025-01-02 ENCOUNTER — Inpatient Hospital Stay

## 2025-03-04 ENCOUNTER — Inpatient Hospital Stay: Admitting: Oncology

## 2025-03-04 ENCOUNTER — Inpatient Hospital Stay
